# Patient Record
Sex: Female | Born: 1945 | Race: Black or African American | Hispanic: No | Smoking: Never smoker
Health system: Southern US, Community
[De-identification: ages and names within clinical notes are randomized; demographics above are authoritative.]

## PROBLEM LIST (undated history)

## (undated) DIAGNOSIS — N189 Chronic kidney disease, unspecified: Secondary | ICD-10-CM

## (undated) DIAGNOSIS — M1990 Unspecified osteoarthritis, unspecified site: Secondary | ICD-10-CM

## (undated) DIAGNOSIS — R600 Localized edema: Secondary | ICD-10-CM

## (undated) DIAGNOSIS — I890 Lymphedema, not elsewhere classified: Secondary | ICD-10-CM

## (undated) DIAGNOSIS — IMO0002 Reserved for concepts with insufficient information to code with codable children: Secondary | ICD-10-CM

## (undated) DIAGNOSIS — D649 Anemia, unspecified: Secondary | ICD-10-CM

## (undated) DIAGNOSIS — G4733 Obstructive sleep apnea (adult) (pediatric): Secondary | ICD-10-CM

## (undated) DIAGNOSIS — J189 Pneumonia, unspecified organism: Secondary | ICD-10-CM

## (undated) DIAGNOSIS — I4891 Unspecified atrial fibrillation: Secondary | ICD-10-CM

## (undated) DIAGNOSIS — E1140 Type 2 diabetes mellitus with diabetic neuropathy, unspecified: Secondary | ICD-10-CM

## (undated) DIAGNOSIS — I10 Essential (primary) hypertension: Secondary | ICD-10-CM

## (undated) HISTORY — DX: Type 2 diabetes mellitus with diabetic neuropathy, unspecified: E11.40

## (undated) HISTORY — DX: Anemia, unspecified: D64.9

## (undated) HISTORY — DX: Reserved for concepts with insufficient information to code with codable children: IMO0002

## (undated) HISTORY — PX: ABDOMINAL HYSTERECTOMY: SHX81

## (undated) HISTORY — DX: Lymphedema, not elsewhere classified: I89.0

## (undated) HISTORY — DX: Essential (primary) hypertension: I10

## (undated) HISTORY — DX: Pneumonia, unspecified organism: J18.9

## (undated) HISTORY — DX: Unspecified osteoarthritis, unspecified site: M19.90

---

## 2010-06-23 ENCOUNTER — Ambulatory Visit: Payer: Self-pay | Admitting: Family Medicine

## 2010-06-23 DIAGNOSIS — R011 Cardiac murmur, unspecified: Secondary | ICD-10-CM | POA: Insufficient documentation

## 2010-06-23 DIAGNOSIS — E1140 Type 2 diabetes mellitus with diabetic neuropathy, unspecified: Secondary | ICD-10-CM | POA: Insufficient documentation

## 2010-06-23 DIAGNOSIS — E1165 Type 2 diabetes mellitus with hyperglycemia: Secondary | ICD-10-CM

## 2010-06-23 DIAGNOSIS — J45909 Unspecified asthma, uncomplicated: Secondary | ICD-10-CM | POA: Insufficient documentation

## 2010-06-23 DIAGNOSIS — I10 Essential (primary) hypertension: Secondary | ICD-10-CM | POA: Insufficient documentation

## 2010-06-23 DIAGNOSIS — R32 Unspecified urinary incontinence: Secondary | ICD-10-CM | POA: Insufficient documentation

## 2010-06-23 DIAGNOSIS — D649 Anemia, unspecified: Secondary | ICD-10-CM | POA: Insufficient documentation

## 2010-06-27 ENCOUNTER — Telehealth (INDEPENDENT_AMBULATORY_CARE_PROVIDER_SITE_OTHER): Payer: Self-pay | Admitting: *Deleted

## 2010-06-27 LAB — CONVERTED CEMR LAB
ALT: 14 units/L (ref 0–35)
AST: 15 units/L (ref 0–37)
Albumin: 3.4 g/dL — ABNORMAL LOW (ref 3.5–5.2)
Alkaline Phosphatase: 71 units/L (ref 39–117)
BUN: 12 mg/dL (ref 6–23)
Basophils Absolute: 0.1 10*3/uL (ref 0.0–0.1)
Basophils Relative: 0.8 % (ref 0.0–3.0)
Bilirubin, Direct: 0.1 mg/dL (ref 0.0–0.3)
CO2: 29 meq/L (ref 19–32)
Calcium: 8.9 mg/dL (ref 8.4–10.5)
Chloride: 99 meq/L (ref 96–112)
Cholesterol: 211 mg/dL — ABNORMAL HIGH (ref 0–200)
Creatinine, Ser: 1 mg/dL (ref 0.4–1.2)
Direct LDL: 129.3 mg/dL
Eosinophils Absolute: 0.1 10*3/uL (ref 0.0–0.7)
Eosinophils Relative: 1.5 % (ref 0.0–5.0)
GFR calc non Af Amer: 72.34 mL/min (ref >60.00)
Glucose, Bld: 264 mg/dL — ABNORMAL HIGH (ref 70–99)
HCT: 38.9 % (ref 36.0–46.0)
HDL: 39.5 mg/dL (ref >39.00)
Hemoglobin: 12.6 g/dL (ref 12.0–15.0)
Hgb A1c MFr Bld: 10.7 % — ABNORMAL HIGH (ref 4.6–6.5)
Lymphocytes Relative: 31.3 % (ref 12.0–46.0)
Lymphs Abs: 2.1 10*3/uL (ref 0.7–4.0)
MCHC: 32.5 g/dL (ref 30.0–36.0)
MCV: 84.2 fL (ref 78.0–100.0)
Monocytes Absolute: 0.7 10*3/uL (ref 0.1–1.0)
Monocytes Relative: 10.1 % (ref 3.0–12.0)
Neutro Abs: 3.7 10*3/uL (ref 1.4–7.7)
Neutrophils Relative %: 56.3 % (ref 43.0–77.0)
Platelets: 282 10*3/uL (ref 150.0–400.0)
Potassium: 4.5 meq/L (ref 3.5–5.1)
RBC: 4.62 M/uL (ref 3.87–5.11)
RDW: 13.5 % (ref 11.5–14.6)
Sodium: 137 meq/L (ref 135–145)
TSH: 1.99 microintl units/mL (ref 0.35–5.50)
Total Bilirubin: 0.4 mg/dL (ref 0.3–1.2)
Total CHOL/HDL Ratio: 5
Total Protein: 6.6 g/dL (ref 6.0–8.3)
Triglycerides: 207 mg/dL — ABNORMAL HIGH (ref 0.0–149.0)
VLDL: 41.4 mg/dL — ABNORMAL HIGH (ref 0.0–40.0)
WBC: 6.6 10*3/uL (ref 4.5–10.5)

## 2010-06-30 ENCOUNTER — Telehealth (INDEPENDENT_AMBULATORY_CARE_PROVIDER_SITE_OTHER): Payer: Self-pay | Admitting: *Deleted

## 2010-07-04 ENCOUNTER — Ambulatory Visit: Payer: Self-pay

## 2010-07-04 ENCOUNTER — Ambulatory Visit (HOSPITAL_COMMUNITY): Admission: RE | Admit: 2010-07-04 | Payer: Self-pay | Source: Home / Self Care | Admitting: Family Medicine

## 2010-07-05 ENCOUNTER — Telehealth (INDEPENDENT_AMBULATORY_CARE_PROVIDER_SITE_OTHER): Payer: Self-pay | Admitting: *Deleted

## 2010-07-07 ENCOUNTER — Encounter: Payer: Self-pay | Admitting: Family Medicine

## 2010-07-19 ENCOUNTER — Telehealth (INDEPENDENT_AMBULATORY_CARE_PROVIDER_SITE_OTHER): Payer: Self-pay | Admitting: *Deleted

## 2010-07-19 ENCOUNTER — Encounter: Payer: Self-pay | Admitting: Family Medicine

## 2010-08-01 ENCOUNTER — Encounter: Payer: Self-pay | Admitting: Family Medicine

## 2010-08-03 ENCOUNTER — Telehealth: Payer: Self-pay | Admitting: Family Medicine

## 2010-08-04 ENCOUNTER — Other Ambulatory Visit: Payer: Self-pay | Admitting: Family Medicine

## 2010-08-04 DIAGNOSIS — Z0000 Encounter for general adult medical examination without abnormal findings: Secondary | ICD-10-CM

## 2010-08-04 DIAGNOSIS — Z1231 Encounter for screening mammogram for malignant neoplasm of breast: Secondary | ICD-10-CM

## 2010-08-05 ENCOUNTER — Ambulatory Visit: Admit: 2010-08-05 | Payer: Self-pay | Admitting: Gastroenterology

## 2010-08-10 ENCOUNTER — Encounter: Payer: Self-pay | Admitting: Family Medicine

## 2010-08-15 ENCOUNTER — Encounter: Payer: Self-pay | Admitting: Family Medicine

## 2010-08-18 NOTE — Letter (Signed)
Summary: CMN for Diabetes Supplies/[facility]  CMN for Diabetes Supplies/[facility]   Imported By: Edmonia James 08/11/2010 13:06:37  _____________________________________________________________________  External Attachment:    Type:   Image     Comment:   External Document

## 2010-08-18 NOTE — Progress Notes (Signed)
Summary: labs-  Phone Note Outgoing Call   Call placed by: Jodi Underwood CMA,  June 27, 2010 10:34 AM Call placed to: Patient Summary of Call: A1C is not well controlled.  Needs to increase Glipizide/Metformin combo to 2 pills two times a day.  Cholesterol is not well controlled either.  goal is <70 w/ DM.  needs to start Lipitor 20mg  nightly and recheck LFTs in 6-8 weeks.  Follow-up for Phone Call        left message on machine ........Jodi KitchenMalachi Underwood CMA  June 27, 2010 10:35 AM   tried calling patient mailbox full unable to leave msg.........Jodi KitchenMalachi Underwood CMA  June 29, 2010 10:26 AM   Additional Follow-up for Phone Call Additional follow up Details #1::        Pt aware rx sent to pharmacy, # updated...........Jodi KitchenFelecia Underwood CMA  June 30, 2010 12:57 PM     New/Updated Medications: GLIPIZIDE-METFORMIN HCL 5-500 MG TABS (GLIPIZIDE-METFORMIN HCL) Take 2  tablet two times a day LIPITOR 20 MG TABS (ATORVASTATIN CALCIUM) Take 1 tab at bedtime Prescriptions: LIPITOR 20 MG TABS (ATORVASTATIN CALCIUM) Take 1 tab at bedtime  #30 x 2   Entered by:   Jodi Underwood CMA   Authorized by:   Jodi Asa MD   Signed by:   Jodi Underwood CMA on 06/30/2010   Method used:   [facility]ed to ...       [facility] [phone]* (retail)       [place], [place]  [address]       Ph: [phone]       Fax: [phone]   RxID:   [id number] GLIPIZIDE-METFORMIN HCL 5-500 MG TABS (GLIPIZIDE-METFORMIN HCL) Take 2  tablet two times a day  #120 x 2   Entered by:   Jodi Underwood CMA   Authorized by:   Jodi Asa MD   Signed by:   Jodi Underwood CMA on 06/30/2010   Method used:   Faxed to ...       [facility] [phone]* (retail)       [address]       [place], [place]  [address]       Ph: [phone]       Fax: [phone]   RxID:   [id number]

## 2010-08-18 NOTE — Miscellaneous (Signed)
Summary: Care Plan/[facility]  Care Plan/[facility]   Imported By: Edmonia James 07/26/2010 12:35:52  _____________________________________________________________________  External Attachment:    Type:   Image     Comment:   External Document

## 2010-08-18 NOTE — Progress Notes (Signed)
Summary: order for wheelchair and therapy-lmom  Phone Note Call from Patient Call back at Home Phone [phone]   Caller: Patient Summary of Call: Spoke w/ patient would like to have order for Electric wheelchair she has already spoken w/ a vendor and will call back w/ information also would like to have referral to [facility] for aquatic therapy but patient is going to check more into this but does know that they offer some services she feel will be helpful before we send any information. Pt informed Dr. Birdie Riddle out of office but can process information tomorrow. Initial call taken by: Malachi Bonds CMA,  July 05, 2010 1:24 PM  Follow-up for Phone Call        left message on machine to see if she had information to fax to company for electric wheelchair........Marland KitchenMalachi Bonds CMA  July 12, 2010 3:38 PM

## 2010-08-18 NOTE — Miscellaneous (Signed)
Summary: Face to Face Encounter/[facility]  Face to Face Encounter/[facility]   Imported By: Edmonia James 07/29/2010 08:10:14  _____________________________________________________________________  External Attachment:    Type:   Image     Comment:   External Document

## 2010-08-18 NOTE — Letter (Signed)
Summary: [facility]  [facility]   Imported By: Edmonia James 07/20/2010 12:20:33  _____________________________________________________________________  External Attachment:    Type:   Image     Comment:   External Document

## 2010-08-18 NOTE — Progress Notes (Signed)
Summary: refill  Phone Note Refill Request Call back at Home Phone [phone] Message from:  Patient  Refills Requested: Medication #1:  ventolin inhaler [facility]  [address]............Marland KitchenFelecia Deloach CMA  July 19, 2010 10:03 AM      New/Updated Medications: VENTOLIN HFA 108 (90 BASE) MCG/ACT AERS (ALBUTEROL SULFATE) 2 puffs every 4-6 hours as needed Prescriptions: VENTOLIN HFA 108 (90 BASE) MCG/ACT AERS (ALBUTEROL SULFATE) 2 puffs every 4-6 hours as needed  #1 x 2   Entered by:   Malachi Bonds CMA   Authorized by:   Annye Asa MD   Signed by:   Malachi Bonds CMA on 07/19/2010   Method used:   Electronically to        [facility] [phone]* (retail)       [address]       [place], [place]  [address]       Ph: [phone]       Fax: [phone]   RxID:   [id number]

## 2010-08-18 NOTE — Progress Notes (Signed)
Summary: Extend PT  Phone Note Other Incoming   Caller: Gaylan Gerold @ [facility]--[phone] Summary of Call: Call from [facility] at [place] and he stated he has done 4 weeks of pt with the patient and would like the verbal ok to extend for  4 more  weeks. He stated the patient really needs it due to her health. Sd  she is doing great but think she will benefit from the extra exercise. Please advise. Initial call taken by: Aron Baba CMA Deborra Medina),  August 03, 2010 2:03 PM  Follow-up for Phone Call        this would be great!!  as long as PT feels she is benefitting they can continue indefinitely Follow-up by: Annye Asa MD,  August 03, 2010 2:11 PM  Additional Follow-up for Phone Call Additional follow up Details #1::        spoke with Aaron Edelman and made him aware of the above. Additional Follow-up by: Aron Baba CMA Deborra Medina),  August 03, 2010 3:22 PM

## 2010-08-18 NOTE — Assessment & Plan Note (Signed)
Summary: new pt--needs first medicare physical//sph   Vital Signs:  Patient profile:   65 year old female Height:      66 inches Weight:      414 pounds BMI:     67.06 Pulse rate:   94 / minute BP sitting:   132 / 90  (left arm)  Vitals Entered By: Malachi Bonds CMA (June 23, 2010 8:35 AM) CC: NEW EST- YEARLY EXAM    History of Present Illness: 65 yo woman here today to establish care.  Previous MD- [facility].  Here for Medicare AWV:  1.   Risk factors based on Past M, S, F history: DM- dx'd 2007.  on Glipizide/Metformin combo two times a day but only taking once daily ('i forget').  overdue on diabetic eye exam- 'at least 2 yrs ago'.  no symptomatic lows.  doesn't check sugars.  saw nutritionist when first dx'd, not since.  not exercising. HTN- on Lisinopril daily.  no CP, SOB, HAs, visual changes.  + edema (chronic) Obesity- not exercising, unable to leave house w/out assistance. 2.   Physical Activities: very limited due to size 3.   Depression/mood: tearful, 'i think i'm depressed'.  retired nearly 2 yrs ago, 'i'm stuck'.  2 daughters locally, 1 granddaughter.  'i have so much more to give but i feel like i'm at the end'.  not interested in medicine, 'i just pray a lot'. 4.   Hearing: normal to whispered voice at 6 ft 5.   ADL's: unable to bathe due to size.  able to cook, get dressed. 6.   Fall Risk: high risk due to mobility issues 7.   Home Safety: lives in handicapped accessible apt [address].   Height, weight, &visual acuity: see vitals, vision corrected to 20/20 w/ glasses 9.   Counseling: provided on healthy diet, exercise, health maintainence. 10.   Labs ordered based on risk factors: see A&P 11.           Referral Coordination: see A&P.  has never had colonoscopy. 12.           Care Plan: see A&P 13.           Cognitive Assessment: normal linear thought process, memory intact  Preventive Screening-Counseling & Management  Alcohol-Tobacco     Alcohol  drinks/day: <1     Smoking Status: never  Caffeine-Diet-Exercise     Does Patient Exercise: no      Drug Use:  never.    Current Medications (verified): 1)  Glipizide-Metformin Hcl 5-500 Mg Tabs (Glipizide-Metformin Hcl) .... Take One Tablet Two Times A Day 2)  Lisinopril 40 Mg Tabs (Lisinopril) .... Take One Tablet Daily  Allergies (verified): No Known Drug Allergies  Past History:  Past Medical History: Anemia-NOS Asthma Diabetes mellitus, type II Hypertension Urinary incontinence Arthritis  Past Surgical History: Pneumonia Complete Hysterectomy  Family History: adopted family hx unknown  Social History: daughter live locally pt lives alone retiredSmoking Status:  never Does Patient Exercise:  no Drug Use:  never  Review of Systems      See HPI  Physical Exam  General:  morbidly obese Head:  NCAT Eyes:  PERRL, EOMI, fundoscopic exam deferred to ophtho Ears:  External ear exam shows no significant lesions or deformities.  Otoscopic examination reveals clear canals, tympanic membranes are intact bilaterally without bulging, retraction, inflammation or discharge. Hearing is grossly normal bilaterally. Nose:  External nasal examination shows no deformity or inflammation. Nasal mucosa are pink and  moist without lesions or exudates. Mouth:  Oral mucosa and oropharynx without lesions or exudates. Neck:  No deformities, masses, or tenderness noted. Breasts:  unable to exam- pt unable to lie on table Lungs:  decreased BS due to habitus Heart:  II/VI SEM heard best at RUSB Abdomen:  firm ventral hernia (examined in a seated position- pt unable to get on table or lie down) + BS Genitalia:  deferred Pulses:  +2 carotid, radial, DP Extremities:  impossible to distinguish edema from overly large legs Neurologic:  alert & oriented X3, cranial nerves II-XII intact, and sensation intact to light touch.   Skin:  Intact without suspicious lesions or rashes (what is  visible Cervical Nodes:  No lymphadenopathy noted Psych:  tearful, obviously distraught by physical situation   Impression & Recommendations:  Problem # 1:  PHYSICAL EXAMINATION (ICD-V70.0) Assessment New  pt's PE extremetly limited due to body habitus.  check labs.  anticipatory guidance provided on diet, exercise, hygeine.  Orders: Radiology Referral (Radiology) Gastroenterology Referral (GI) Welcome to Medicare, Physical [phone])  Problem # 2:  DIABETES MELLITUS, TYPE II (ICD-250.00) Assessment: New check labs.  adjust meds as needed.  will need eye exam. Her updated medication list for this problem includes:    Glipizide-metformin Hcl 5-500 Mg Tabs (Glipizide-metformin hcl) .Marland Kitchen... Take one tablet two times a day    Lisinopril 40 Mg Tabs (Lisinopril) .Marland Kitchen... Take one tablet daily  Orders: Venipuncture [id number]) Specimen Handling (99000) TLB-A1C / Hgb A1C (Glycohemoglobin) (83036-A1C) TLB-BMP (Basic Metabolic Panel-BMET) (99991111) TLB-Lipid Panel (80061-LIPID) TLB-Hepatic/Liver Function Pnl (80076-HEPATIC)  Problem # 3:  HYPERTENSION (ICD-401.9) Assessment: New fair control.  will follow closely. Her updated medication list for this problem includes:    Lisinopril 40 Mg Tabs (Lisinopril) .Marland Kitchen... Take one tablet daily  Orders: TLB-CBC Platelet - w/Differential (85025-CBCD) TLB-TSH (Thyroid Stimulating Hormone) (84443-TSH)  Problem # 4:  MORBID OBESITY (ICD-278.01) Assessment: New  pt's biggest health problem at this time.  impacting every aspect of her life.  will refer for home health eval to see if pt can get adaptive services and PT.  Orders: Home Health Referral ([place])  Problem # 5:  SYSTOLIC MURMUR (99991111.2) Assessment: New refer to cards for ECHO evaluation and also EKG once pt is lying flat. Orders: Echo Referral (Echo)  Complete Medication List: 1)  Glipizide-metformin Hcl 5-500 Mg Tabs (Glipizide-metformin hcl) .... Take one tablet two times a  day 2)  Lisinopril 40 Mg Tabs (Lisinopril) .... Take one tablet daily  Patient Instructions: 1)  Follow up in 1 month to re-assess everything (30 minute appt) 2)  We'll notify you of your lab results 3)  Someone will call you with your cardiology appt 4)  We'll arrange a home health evaluation 5)  We'll look into the mammogram and colonoscopy 6)  Consider counseling for your depression 7)  Please call with any questions or concerns 8)  Welcome!  We're glad to have you! 9)  Happy Holidays!   Orders Added: 1)  Venipuncture [id number] 2)  Specimen Handling [99000] 3)  TLB-A1C / Hgb A1C (Glycohemoglobin) [83036-A1C] 4)  TLB-BMP (Basic Metabolic Panel-BMET) [id number] 5)  TLB-CBC Platelet - w/Differential [85025-CBCD] 6)  TLB-TSH (Thyroid Stimulating Hormone) [84443-TSH] 7)  TLB-Lipid Panel [80061-LIPID] 8)  TLB-Hepatic/Liver Function Pnl [80076-HEPATIC] 9)  Echo Referral [Echo] 10)  Radiology Referral [Radiology] 11)  Gastroenterology Referral [GI] 12)  Home Health Referral [Home Health] 13)  Welcome to Medicare, Physical [G0402] 14)  New Patient Level III [id number]

## 2010-08-18 NOTE — Progress Notes (Signed)
Summary: refill 90 day supply  Phone Note Refill Request Message from:  Fax from Pharmacy on June 30, 2010 4:53 PM  Refills Requested: Medication #1:  GLIPIZIDE-METFORMIN HCL 5-500 MG TABS Take 2  tablet two times a day  Medication #2:  LISINOPRIL 40 MG TABS take one tablet daily Lesly [facility] - [id number] - note from pharmacy - ins requires 90 day supply   Initial call taken by: Arbie [place],  June 30, 2010 4:54 PM    Prescriptions: LIPITOR 20 MG TABS (ATORVASTATIN CALCIUM) Take 1 tab at bedtime  #90 x 1   Entered by:   Malachi Bonds CMA   Authorized by:   Annye Asa MD   Signed by:   Malachi Bonds CMA on 07/01/2010   Method used:   Electronically to        [facility] [phone]* (retail)       [place], [place]  [address]       Ph: [phone]       Fax: [phone]   RxID:   [id number] LISINOPRIL 40 MG TABS (LISINOPRIL) take one tablet daily  #90 x 1   Entered by:   Malachi Bonds CMA   Authorized by:   Annye Asa MD   Signed by:   Malachi Bonds CMA on 07/01/2010   Method used:   Electronically to        [facility] [phone]* (retail)       [address]       [place], [place]  [address]       Ph: [phone]       Fax: [phone]   RxID:   [id number] GLIPIZIDE-METFORMIN HCL 5-500 MG TABS (GLIPIZIDE-METFORMIN HCL) Take 2  tablet two times a day  #360 x 1   Entered by:   Malachi Bonds CMA   Authorized by:   Annye Asa MD   Signed by:   Malachi Bonds CMA on 07/01/2010   Method used:   Electronically to        [facility] [phone]* (retail)       [address]       [place], [place]  [address]       Ph: [phone]       Fax: [phone]   RxID:   [id number]

## 2010-08-23 ENCOUNTER — Ambulatory Visit (HOSPITAL_COMMUNITY): Admission: RE | Admit: 2010-08-23 | Payer: Self-pay | Source: Ambulatory Visit

## 2010-08-23 ENCOUNTER — Ambulatory Visit (HOSPITAL_COMMUNITY): Admission: RE | Admit: 2010-08-23 | Payer: Self-pay | Source: Home / Self Care | Admitting: Family Medicine

## 2010-08-24 NOTE — Miscellaneous (Signed)
Summary: PT Orders/[facility]  PT Orders/[facility]   Imported By: Edmonia James 08/19/2010 14:12:49  _____________________________________________________________________  External Attachment:    Type:   Image     Comment:   External Document

## 2010-09-01 NOTE — Miscellaneous (Signed)
Summary: Glucose Supplies/[facility]  Glucose Supplies/[facility]   Imported By: Phillis Knack 08/24/2010 07:00:54  _____________________________________________________________________  External Attachment:    Type:   Image     Comment:   External Document

## 2010-09-19 ENCOUNTER — Encounter: Payer: Self-pay | Admitting: Family Medicine

## 2010-09-19 DIAGNOSIS — IMO0001 Reserved for inherently not codable concepts without codable children: Secondary | ICD-10-CM

## 2010-09-19 DIAGNOSIS — E1165 Type 2 diabetes mellitus with hyperglycemia: Secondary | ICD-10-CM

## 2010-09-19 DIAGNOSIS — R269 Unspecified abnormalities of gait and mobility: Secondary | ICD-10-CM

## 2010-09-19 DIAGNOSIS — I10 Essential (primary) hypertension: Secondary | ICD-10-CM

## 2010-09-27 NOTE — Miscellaneous (Signed)
Summary: Certification and Plan of Care/[facility]  Certification and Plan of [place] By: Laural Benes 09/22/2010 09:53:54  _____________________________________________________________________  External Attachment:    Type:   Image     Comment:   External Document

## 2011-03-13 ENCOUNTER — Telehealth: Payer: Self-pay | Admitting: Family Medicine

## 2011-03-16 NOTE — Telephone Encounter (Signed)
Jodi Underwood, per Dr. Birdie Riddle, that [facility] should be doing the mobility chair evaluation

## 2011-03-21 ENCOUNTER — Telehealth: Payer: Self-pay | Admitting: Family Medicine

## 2011-03-21 ENCOUNTER — Ambulatory Visit: Payer: Medicare Other | Admitting: Family Medicine

## 2011-03-22 NOTE — Telephone Encounter (Signed)
Left message for pt to call back for message clarification

## 2011-03-30 NOTE — Telephone Encounter (Signed)
Pt had appointment scheduled on 03/21/11, then again on 04/03/11 for mobility chair evaluation, however both were cancelled. Did not receive a returned call for clarification on message

## 2011-04-03 ENCOUNTER — Ambulatory Visit: Payer: Medicare Other | Admitting: Family Medicine

## 2011-05-24 ENCOUNTER — Telehealth: Payer: Self-pay | Admitting: *Deleted

## 2011-05-24 NOTE — Telephone Encounter (Signed)
Called pt to verify that she did not have [facility] set up for her wheelchair. Pt advised that she mis-spoke and wants whomever MD Birdie Riddle has her set up for wheelchair referal. [facility] care to speak with Andria Rhein left vm for debbie to call back to advise of a wheelchair assessment for pt appt at 11:30pm on nov 15th

## 2011-05-26 ENCOUNTER — Telehealth: Payer: Self-pay | Admitting: *Deleted

## 2011-05-26 NOTE — Telephone Encounter (Signed)
Manually faxed last 2 visits of pt history to [facility]

## 2011-05-31 ENCOUNTER — Encounter: Payer: Self-pay | Admitting: Family Medicine

## 2011-06-01 ENCOUNTER — Ambulatory Visit: Payer: Medicare Other | Admitting: Family Medicine

## 2011-06-06 ENCOUNTER — Telehealth: Payer: Self-pay | Admitting: Family Medicine

## 2011-06-06 NOTE — Telephone Encounter (Signed)
Called and spoke w/ nurse care manager regarding pt's care.  I have only seen pt once (the day she established) and she has subsequently cancelled all her f/u appts.  Nurse reports that pt has had no transportation and they have hired a transportation company to help her make her appts.  Will attempt to schedule pt for her mobility evaluation on  Tuesday 11/27 at 11:30am.  [facility] is pending- nurse is calling pt.

## 2011-06-06 NOTE — Telephone Encounter (Signed)
Transportation is to contact pt to advise of appt for OV for our office on 06-13-11 at 11:30am. [place] and spoke to Andria Rhein she did agree to come for the [employer]. Called to tell angela thomas that [facility] is coming for the appt. Levada Dy advised that she will contact pt to confirm appt with pt, she plans to just come for appt however will contact us back if the pt can not make appt. This nurse scheduled appt on 06-13-11 for 11:30, had to set up for 15 minutes per gives extra 15.

## 2011-06-13 ENCOUNTER — Ambulatory Visit (INDEPENDENT_AMBULATORY_CARE_PROVIDER_SITE_OTHER): Payer: Medicare Other | Admitting: Family Medicine

## 2011-06-13 ENCOUNTER — Encounter: Payer: Self-pay | Admitting: Family Medicine

## 2011-06-13 ENCOUNTER — Telehealth: Payer: Self-pay | Admitting: *Deleted

## 2011-06-13 DIAGNOSIS — I890 Lymphedema, not elsewhere classified: Secondary | ICD-10-CM | POA: Insufficient documentation

## 2011-06-13 DIAGNOSIS — I10 Essential (primary) hypertension: Secondary | ICD-10-CM

## 2011-06-13 DIAGNOSIS — E119 Type 2 diabetes mellitus without complications: Secondary | ICD-10-CM

## 2011-06-13 LAB — CBC WITH DIFFERENTIAL/PLATELET
Basophils Absolute: 0.1 10*3/uL (ref 0.0–0.1)
Basophils Relative: 0.9 % (ref 0.0–3.0)
Eosinophils Absolute: 0.1 10*3/uL (ref 0.0–0.7)
Eosinophils Relative: 1.3 % (ref 0.0–5.0)
HCT: 38.6 % (ref 36.0–46.0)
Hemoglobin: 12.5 g/dL (ref 12.0–15.0)
Lymphocytes Relative: 28.8 % (ref 12.0–46.0)
Lymphs Abs: 2.2 10*3/uL (ref 0.7–4.0)
MCHC: 32.3 g/dL (ref 30.0–36.0)
MCV: 84.1 fl (ref 78.0–100.0)
Monocytes Absolute: 0.7 10*3/uL (ref 0.1–1.0)
Monocytes Relative: 9.4 % (ref 3.0–12.0)
Neutro Abs: 4.6 10*3/uL (ref 1.4–7.7)
Neutrophils Relative %: 59.6 % (ref 43.0–77.0)
Platelets: 285 10*3/uL (ref 150.0–400.0)
RBC: 4.59 Mil/uL (ref 3.87–5.11)
RDW: 13.8 % (ref 11.5–14.6)
WBC: 7.7 10*3/uL (ref 4.5–10.5)

## 2011-06-13 LAB — BASIC METABOLIC PANEL
BUN: 15 mg/dL (ref 6–23)
CO2: 26 mEq/L (ref 19–32)
Calcium: 9.4 mg/dL (ref 8.4–10.5)
Chloride: 103 mEq/L (ref 96–112)
Creatinine, Ser: 1 mg/dL (ref 0.4–1.2)
GFR: 69.68 mL/min (ref >60.00)
Glucose, Bld: 228 mg/dL — ABNORMAL HIGH (ref 70–99)
Potassium: 4 mEq/L (ref 3.5–5.1)
Sodium: 139 mEq/L (ref 135–145)

## 2011-06-13 LAB — HEPATIC FUNCTION PANEL
ALT: 15 U/L (ref 0–35)
AST: 17 U/L (ref 0–37)
Albumin: 3.6 g/dL (ref 3.5–5.2)
Alkaline Phosphatase: 60 U/L (ref 39–117)
Bilirubin, Direct: 0.1 mg/dL (ref 0.0–0.3)
Total Bilirubin: 0.5 mg/dL (ref 0.3–1.2)
Total Protein: 7.1 g/dL (ref 6.0–8.3)

## 2011-06-13 LAB — LIPID PANEL
Cholesterol: 193 mg/dL (ref 0–200)
HDL: 48.1 mg/dL (ref >39.00)
LDL Cholesterol: 110 mg/dL — ABNORMAL HIGH (ref 0–99)
Total CHOL/HDL Ratio: 4
Triglycerides: 175 mg/dL — ABNORMAL HIGH (ref 0.0–149.0)
VLDL: 35 mg/dL (ref 0.0–40.0)

## 2011-06-13 LAB — TSH: TSH: 2.29 u[IU]/mL (ref 0.35–5.50)

## 2011-06-13 NOTE — Patient Instructions (Signed)
We will call [facility] and arrange a mobility evaluation appt We'll let you know about your lab results and make any med changes as needed Someone will call you with your nutrition and vascular appt I'm so glad you're back!!  We'll make this right! Call with any questions or concerns Happy Holidays!

## 2011-06-13 NOTE — Telephone Encounter (Signed)
Spoke to pt to advise that [facility] will and can come out on Friday 06-16-11 at 11:30am if pt is able to come. Pt advised that she will be able to come. I asked if she will have a ride to facility, pt advised that she will. Spoke to Darlina Guys from [facility] to verfiy that she will INDEED be here on Friday 06-16-11 at 11:30am

## 2011-06-13 NOTE — Telephone Encounter (Signed)
Noted! Thank you

## 2011-06-13 NOTE — Progress Notes (Signed)
  Subjective:    Patient ID: Jodi Solders, female    DOB: [birth year], 65 y.o.   MRN: [id number]  HPI DM- chronic problem.  Pt has not been seen in nearly 1 yr due to mobility issues and transportation difficulties.  Prior to her electric chair breaking she was going to exercise in the pool regularly.  Had attempted to limit her carb intake and increase her fruits and veggies.  Denies symptomatic lows, dizziness, CP, SOB, HAs, visual changes.  + edema (chronic)  Obesity- pt has not lost any weight in the last year despite her efforts at changing diet and starting regular exercise.  Pt reports she had initially lost weight but once she was no longer able to exercise she gained it back.  Needs power chair to facilitate her exercise.  HTN- chronic problem.  Fairly well controlled.  Taking lisinopril.  Asymptomatic.  Lymphedema- new.  Severely limits pt's mobility.  Legs are too large for compression hose.  Has never seen vascular.   Review of Systems For ROS see HPI     Objective:   Physical Exam  Vitals reviewed. Constitutional: She is oriented to person, place, and time. She appears well-developed and well-nourished. No distress.       Morbidly obese, sitting in wheelchair  HENT:  Head: Normocephalic and atraumatic.  Eyes: Conjunctivae and EOM are normal. Pupils are equal, round, and reactive to light.  Neck: Normal range of motion. Neck supple. No thyromegaly present.  Cardiovascular: Normal rate, regular rhythm and intact distal pulses.   Murmur (I-II/VI SEM) heard. Pulmonary/Chest: Effort normal and breath sounds normal. No respiratory distress.  Abdominal: Soft. She exhibits no distension. There is no tenderness.  Musculoskeletal: Edema: 2+ bilaterally.  Lymphadenopathy:    She has no cervical adenopathy.  Neurological: She is alert and oriented to person, place, and time.  Skin: Skin is warm and dry.  Psychiatric: She has a normal mood and affect. Her behavior is normal.           Assessment & Plan:

## 2011-06-16 ENCOUNTER — Ambulatory Visit (INDEPENDENT_AMBULATORY_CARE_PROVIDER_SITE_OTHER): Payer: Medicare Other | Admitting: Family Medicine

## 2011-06-16 ENCOUNTER — Encounter: Payer: Self-pay | Admitting: Family Medicine

## 2011-06-16 VITALS — BP 142/80 | HR 83 | Temp 98.0°F | Ht 66.0 in | Wt >= 6400 oz

## 2011-06-16 DIAGNOSIS — M1990 Unspecified osteoarthritis, unspecified site: Secondary | ICD-10-CM | POA: Insufficient documentation

## 2011-06-16 DIAGNOSIS — Z7409 Other reduced mobility: Secondary | ICD-10-CM

## 2011-06-16 DIAGNOSIS — R69 Illness, unspecified: Secondary | ICD-10-CM

## 2011-06-16 DIAGNOSIS — IMO0002 Reserved for concepts with insufficient information to code with codable children: Secondary | ICD-10-CM | POA: Insufficient documentation

## 2011-06-16 NOTE — Assessment & Plan Note (Addendum)
Due to pt's back, shoulder, hip, and knee pain/arthritis, as well as her SOB from asthma and her lymphedema she requires a power chair.  With assistance of [facility] [place], mobility assessment was performed.  Total time w/ pt was 37 minutes, >50% counseling and coordinating pt care

## 2011-06-16 NOTE — Progress Notes (Signed)
  Subjective:    Patient ID: Jodi Underwood, female    DOB: 1945-10-01, 65 y.o.   MRN: [id number]  HPI Mobility assessment- pt previously had a power chair but this chair is in disrepair and not able to be fixed.  Unable to walk at all w/out walker due to degenerative changes of back, L hip, and knees bilaterally.  With assistance of a walker she is able to walk ~5 steps prior to developing pain of 10/10 and SOB due to asthma and morbid obesity (412 lbs).  Due to this weight of 412 lbs pt will require heavy duty power chair.  L shoulder pain- will occasional 'catch', pain worsens w/ increased activity such as propelling manual chair.  Pain is 8-9/10 and requires pain meds.  Pt actually avoids activity to prevent pain.  Back pain- occurs when pt makes any sort of circular motion of shoulders (ie- propelling manual chair).  Pain is 8-9/10 and will extend 'all the way across my back but starts on this side (L)'.  Has hx of degenerative disc disease in back.  (also w/ degenerative changes of hip, knees, shoulders).  For these same reasons she is unable to operate a scooter due to tiller steering mechanisms  Pt is unable to move from room to room in her home or prepare food w/out assistance of this chair.  Despite the noted physical limitations in using a walker or manual chair, she has no limitations physically or cognitively in operating a power chair.  She is not only agreeable to using this chair but has been looking forward to receiving her new chair.   Review of Systems See below    Objective:   Physical Exam  Musculoskeletal:       Left shoulder: She exhibits pain and spasm.       RUE- strength 4/5 LUE- strength 4/5 LLE- strength 4/5 RLE- strenth 5/5     Lymphedema- pt w/ severe swelling of legs bilaterally.        Assessment & Plan:

## 2011-06-16 NOTE — Patient Instructions (Signed)
We'll work on getting this Wellsite geologist in there! The labs look better!!! Call with any questions or concerns Happy Holidays!

## 2011-06-25 NOTE — Assessment & Plan Note (Signed)
Chronic problem.  Well controlled today.  Asymptomatic.  No changes.

## 2011-06-25 NOTE — Assessment & Plan Note (Signed)
Unchanged.  Again discussed the importance of healthy diet and regular exercise.  Pt aware and is trying very hard to get her electric chair replaced so that she is able to resume her exercise program.  Will assist as able.

## 2011-06-25 NOTE — Assessment & Plan Note (Signed)
Chronic problem.  Uncertain of control- due for labs.  Will adjust meds prn.  Discussed importance of regular f/u's to ensure she is well cared for and attempt to avoid complications.  Pt expressed understanding and is in agreement w/ plan.

## 2011-06-25 NOTE — Assessment & Plan Note (Signed)
New problem.  Severely limiting mobility.  Will refer to vascular for complete evaluation and possible tx.  Pt expressed understanding and is in agreement w/ plan.

## 2011-06-27 ENCOUNTER — Ambulatory Visit
Admission: RE | Admit: 2011-06-27 | Discharge: 2011-06-27 | Disposition: A | Discharge status: Home / Self Care | Payer: Medicare Other | Source: Ambulatory Visit | Attending: Family Medicine | Admitting: Family Medicine

## 2011-06-27 DIAGNOSIS — Z1231 Encounter for screening mammogram for malignant neoplasm of breast: Secondary | ICD-10-CM

## 2011-07-19 ENCOUNTER — Ambulatory Visit (INDEPENDENT_AMBULATORY_CARE_PROVIDER_SITE_OTHER): Payer: Medicare Other | Admitting: Family Medicine

## 2011-07-19 ENCOUNTER — Encounter: Payer: Self-pay | Admitting: Family Medicine

## 2011-07-19 DIAGNOSIS — I10 Essential (primary) hypertension: Secondary | ICD-10-CM

## 2011-07-19 DIAGNOSIS — E119 Type 2 diabetes mellitus without complications: Secondary | ICD-10-CM

## 2011-07-19 DIAGNOSIS — Z0000 Encounter for general adult medical examination without abnormal findings: Secondary | ICD-10-CM | POA: Insufficient documentation

## 2011-07-19 DIAGNOSIS — N3946 Mixed incontinence: Secondary | ICD-10-CM

## 2011-07-19 DIAGNOSIS — E785 Hyperlipidemia, unspecified: Secondary | ICD-10-CM | POA: Insufficient documentation

## 2011-07-19 DIAGNOSIS — Z1211 Encounter for screening for malignant neoplasm of colon: Secondary | ICD-10-CM

## 2011-07-19 NOTE — Patient Instructions (Signed)
Follow up the end of Feb/beginning of March to recheck diabetes Someone will call you with your GI appt and Nutrition appt Keep up the good work!  You're getting there! Call with any questions or concerns Happy New Year!!!

## 2011-07-19 NOTE — Progress Notes (Signed)
  Subjective:    Patient ID: Jodi Underwood, female    DOB: May 11, 1946, 66 y.o.   MRN: [id number]  HPI Here today for CPE.  Risk Factors: DM- chronic problem, has lost 13 lbs!  On Glipizide-Metformin and Januvia.  Overdue on eye exam- plans on scheduling this [facility]). HTN- chronic problem.  Stable today.  Denies CP, SOB above baseline, HAs, visual changes.  + edema. Hyperlipidemia-  Chronic problem.  Labs reviewed from last visit.  Tolerating statin w/out difficulty. Incontinence- chronic problem, has mixed stress and urge incontinence. Physical Activity: was previously going to the gym and swimming but is waiting on electric chair Fall Risk: high risk Depression: admits to 'down days' due to her mobility impairment. Hearing: normal to whispered voice at 6 ft ADL's: independent Cognitive: normal linear thought process, memory and attention intact Home Safety: feels safe at home, lives w/ daughter Height, Weight, BMI, Visual Acuity: see vitals, vision corrected to 20/20 w/ glasses Counseling: UTD on mammo, has never had colonoscopy (open to referral) Labs Ordered: See A&P Care Plan: See A&P    Review of Systems Patient reports no vision/ hearing changes, adenopathy,fever, weight change,  persistant/recurrent hoarseness , swallowing issues, chest pain, palpitations, persistant/recurrent cough, hemoptysis, gastrointestinal bleeding (melena, rectal bleeding), abdominal pain, significant heartburn, bowel changes, GU symptoms (dysuria, hematuria), Gyn symptoms (abnormal  bleeding, pain),  syncope, focal weakness, memory loss, numbness & tingling, skin/hair/nail changes, abnormal bruising or bleeding.  + edema, SOB (stable), depression    Objective:   Physical Exam General Appearance:    Alert, cooperative, no distress, morbidly obese in wheel chair  Head:    Normocephalic, without obvious abnormality, atraumatic  Eyes:    PERRL, conjunctiva/corneas clear, EOM's intact, fundi    benign,  both eyes  Ears:    Normal TM's and external ear canals, both ears  Nose:   Nares normal, septum midline, mucosa normal, no drainage    or sinus tenderness  Throat:   Lips, mucosa, and tongue normal; teeth and gums normal  Neck:   Supple, symmetrical, trachea midline, no adenopathy;    Thyroid: no enlargement/tenderness/nodules  Back:     Symmetric, no CVA tenderness  Lungs:     Clear to auscultation bilaterally, respirations unlabored  Chest Wall:    No tenderness or deformity   Heart:    Regular rate and rhythm, S1 and S2 normal, no murmur, rub   or gallop  Breast Exam:    Deferred  Abdomen:     Soft, non-tender, bowel sounds active all four quadrants,    no masses, no organomegaly  Genitalia:    Deferred  Rectal:    Extremities:   Considerable swelling of bilateral LEs  Pulses:   2+ and symmetric all extremities  Skin:   Skin color, texture, turgor normal, no rashes or lesions  Lymph nodes:   Cervical, supraclavicular, and axillary nodes normal  Neurologic:   CNII-XII intact, normal strength, sensation and reflexes    throughout          Assessment & Plan:

## 2011-07-21 ENCOUNTER — Encounter: Payer: Self-pay | Admitting: Gastroenterology

## 2011-07-21 ENCOUNTER — Other Ambulatory Visit: Payer: Self-pay

## 2011-07-21 DIAGNOSIS — R600 Localized edema: Secondary | ICD-10-CM

## 2011-07-24 ENCOUNTER — Other Ambulatory Visit: Payer: Self-pay | Admitting: Family Medicine

## 2011-07-24 MED ORDER — ALBUTEROL SULFATE HFA 108 (90 BASE) MCG/ACT IN AERS: 2.0000 | INHALATION_SPRAY | Freq: Four times a day (QID) | RESPIRATORY_TRACT | Status: DC | PRN

## 2011-07-24 NOTE — Telephone Encounter (Signed)
rx sent to pharmacy by e-script  

## 2011-07-30 DIAGNOSIS — N3946 Mixed incontinence: Secondary | ICD-10-CM | POA: Insufficient documentation

## 2011-07-30 NOTE — Assessment & Plan Note (Signed)
New.  Pt reports sxs of mixed incontinence.  Offered uro referral but pt prefers to do Kegel's and see if sxs improve.  Will continue to follow.

## 2011-07-30 NOTE — Assessment & Plan Note (Signed)
Chronic problem.  Tolerating statin w/out difficulty.  Reviewed labs from previous visit.  Applauded weight loss efforts.

## 2011-07-30 NOTE — Assessment & Plan Note (Signed)
Pt's PE WNL w/ exception of obesity and lymph edema.  Pt is trying to take care of her health care needs after a year long medical hiatus.  Will refer for colonoscopy.  Has mammo upcoming.  Reviewed labs from previous visit.  Will continue to follow pt closely.

## 2011-07-30 NOTE — Assessment & Plan Note (Signed)
Chronic problem.  Has lost 13 lbs since last visit.  Would like to see nutrition to get more info on how to better manage her dz.  Too early for repeat A1C.  Applauded her recent efforts.  Will continue to follow closely.

## 2011-07-30 NOTE — Assessment & Plan Note (Signed)
BP control stable but not ideal today.  No med changes but will continue to follow closely.  Hopefully pt's increased attention to diet and increased exercise will improve control.

## 2011-07-31 ENCOUNTER — Ambulatory Visit: Payer: Medicare Other | Admitting: Gastroenterology

## 2011-08-03 ENCOUNTER — Other Ambulatory Visit: Payer: Medicare Other

## 2011-08-03 ENCOUNTER — Encounter: Payer: Medicare Other | Admitting: Vascular Surgery

## 2011-08-22 ENCOUNTER — Ambulatory Visit: Payer: Medicare Other | Admitting: Gastroenterology

## 2011-08-23 ENCOUNTER — Other Ambulatory Visit: Payer: Self-pay | Admitting: Family Medicine

## 2011-08-23 MED ORDER — LISINOPRIL 40 MG PO TABS: 40.0000 mg | ORAL_TABLET | Freq: Every day | ORAL | Status: DC

## 2011-08-23 NOTE — Telephone Encounter (Signed)
Rx sent 

## 2011-09-13 ENCOUNTER — Encounter: Payer: Self-pay | Admitting: Vascular Surgery

## 2011-09-14 ENCOUNTER — Encounter: Payer: Medicare Other | Admitting: Vascular Surgery

## 2011-10-02 ENCOUNTER — Telehealth: Payer: Self-pay | Admitting: Family Medicine

## 2011-10-02 ENCOUNTER — Telehealth: Payer: Self-pay | Admitting: *Deleted

## 2011-10-02 NOTE — Telephone Encounter (Signed)
Resume Januvia.  Eat a low-fat diet with lots of fruits and vegetables, up to 7-9 servings per day. Consume less than 30( preferably ZERO) grams of sugar per day from foods & drinks with High Fructose Corn Syrup (HFCS) sugar as #1,2,3 or # 4 on label.[employer], [employer] do not carry products with HFCS. Follow a  low carb nutrition program such as Gibbs or The New Sugar Busters  to prevent Diabetes progression . White carbohydrates (potatoes, rice, bread, and pasta) have a high spike of sugar and a high load of sugar. For example a  baked potato has a cup of sugar and a  french fry  2 teaspoons of sugar. Yams, wild  rice, whole grained bread &  wheat pasta have been much lower spike and load of  sugar. Portions should be the size of a deck of cards or your palm.

## 2011-10-02 NOTE — Telephone Encounter (Signed)
Refill- glipizide-metformin 5-500mg . Take 2 tablet two times a day. Qty 360 last fill 7.16.12

## 2011-10-02 NOTE — Telephone Encounter (Signed)
Left message to call office to verify med dose.

## 2011-10-02 NOTE — Telephone Encounter (Signed)
Call transfer from call-a-nurse: Pt BS today fasting 342, yesterday 288, and the previous day 389 all reading are fasting. Pt indicated that she was given Januvia 100 mg samples but is currently out so she has not been taking med. Pt notes that she has only been taking the metaglip. Pt would like to know if she needs to continue with the Januvia.Please advise

## 2011-10-02 NOTE — Telephone Encounter (Signed)
Left message to call office

## 2011-10-06 MED ORDER — SITAGLIPTIN PHOSPHATE 100 MG PO TABS: 100.0000 mg | ORAL_TABLET | Freq: Every day | ORAL | Status: DC

## 2011-10-06 MED ORDER — GLUCOSE BLOOD VI STRP: ORAL_STRIP | Status: AC

## 2011-10-06 MED ORDER — ONETOUCH DELICA LANCETS MISC: Status: DC

## 2011-10-06 MED ORDER — GLIPIZIDE-METFORMIN HCL 5-500 MG PO TABS: 1.0000 | ORAL_TABLET | Freq: Two times a day (BID) | ORAL | Status: DC

## 2011-10-06 NOTE — Telephone Encounter (Signed)
Pt states that current sig on file is correct med was decreased. Rx sent.

## 2011-10-06 NOTE — Telephone Encounter (Signed)
Discuss with patient  

## 2011-10-17 ENCOUNTER — Ambulatory Visit: Payer: Medicare Other | Admitting: *Deleted

## 2011-10-31 ENCOUNTER — Encounter: Payer: Self-pay | Admitting: Vascular Surgery

## 2011-11-02 ENCOUNTER — Encounter: Payer: Self-pay | Admitting: Vascular Surgery

## 2011-11-02 ENCOUNTER — Ambulatory Visit (INDEPENDENT_AMBULATORY_CARE_PROVIDER_SITE_OTHER): Payer: Medicare Other | Admitting: *Deleted

## 2011-11-02 ENCOUNTER — Ambulatory Visit (INDEPENDENT_AMBULATORY_CARE_PROVIDER_SITE_OTHER): Payer: Medicare Other | Admitting: Vascular Surgery

## 2011-11-02 VITALS — BP 135/79 | HR 87 | Resp 18 | Ht 66.0 in | Wt 398.0 lb

## 2011-11-02 DIAGNOSIS — I890 Lymphedema, not elsewhere classified: Secondary | ICD-10-CM

## 2011-11-02 NOTE — Progress Notes (Signed)
[facility] HISTORY AND PHYSICAL   History of Present Illness:  Patient is a 66 y.o. year old female who presents for evaluation of lymphedema and leg swelling.  Other medical problems include severe obesity, anemia, arthritis, asthma, diabetes, hypertension, urinary incontinence, degenerative disc disease, neuropathy.  These are all currently stable and followed by Dr. Birdie Riddle. The patient states she has been having slowly progressive swelling of her lower extremities for the last 30 years. This is become progressively worse over the last 10 years. The leg swelling is to the point now she can get shoes on. She has been counseled on weight loss and has lost 16 pounds in the last year. She denies any prior travel outside [place]. She denies any family history of lymphedema. She denies any significant trauma to her lower extremities her prior DVT.  Past Medical History  Diagnosis Date  . Anemia   . Arthritis   . Asthma   . Diabetes mellitus   . Hypertension   . Urinary incontinence   . Pneumonia   . Degenerative disc disease   . Diabetic neuropathy   . Lymphedema     Past Surgical History  Procedure Date  . Abdominal hysterectomy     COMPLETE     Social History History  Substance Use Topics  . Smoking status: Never Smoker   . Smokeless tobacco: Never Used  . Alcohol Use: Yes     occassionally    Family History Family History  Problem Relation Age of Onset  . Adopted: Yes    Allergies  No Known Allergies   Current Outpatient Prescriptions  Medication Sig Dispense Refill  . albuterol (PROVENTIL HFA;VENTOLIN HFA) 108 (90 BASE) MCG/ACT inhaler Inhale 2 puffs into the lungs every 6 (six) hours as needed.  18 g  1  . glipiZIDE-metformin (METAGLIP) 5-500 MG per tablet Take 2 tablets by mouth 2 (two) times daily before a meal.      . glucose blood (ONE TOUCH TEST STRIPS) test strip Use as instructed  100 each  1  . lisinopril  (PRINIVIL,ZESTRIL) 40 MG tablet Take 1 tablet (40 mg total) by mouth daily.  90 tablet  0  . ONETOUCH DELICA LANCETS MISC Test once daily  100 each  1  . DISCONTD: glipiZIDE-metformin (METAGLIP) 5-500 MG per tablet Take 1 tablet by mouth 2 (two) times daily before a meal.  180 tablet  0  . atorvastatin (LIPITOR) 20 MG tablet Take 20 mg by mouth daily.        . sitaGLIPtin (JANUVIA) 100 MG tablet Take 1 tablet (100 mg total) by mouth daily.  90 tablet  0    ROS:   General:  No Fever, chills  HEENT: No recent headaches, no nasal bleeding, no visual changes, no sore throat  Neurologic: No dizziness, blackouts, seizures. No recent symptoms of stroke or mini- stroke. No recent episodes of slurred speech, or temporary blindness.  Cardiac: No recent episodes of chest pain/pressure, no shortness of breath at rest.  No shortness of breath with exertion.  Denies history of atrial fibrillation or irregular heartbeat  Vascular: No history of rest pain in feet.  No history of claudication.  No history of non-healing ulcer, No history of DVT   Pulmonary: No home oxygen, no productive cough, no hemoptysis  Musculoskeletal:  [ ]  Arthritis, [x ] Low back pain,  [ x] Joint pain  Hematologic:No history of hypercoagulable state.  No history of easy bleeding.  No history of anemia  Gastrointestinal: No hematochezia or melena,  No gastroesophageal reflux, no trouble swallowing  Urinary: [ ]  chronic Kidney disease, [ ]  on HD - [ ]  MWF or [ ]  TTHS, [ ]  Burning with urination, [ ]  Frequent urination, [ ]  Difficulty urinating;   Skin: No rashes  Psychological: No history of anxiety,  No history of depression   Physical Examination  Filed Vitals:   11/02/11 1227  BP: 135/79  Pulse: 87  Resp: 18  Height: 5\' 6"  (1.676 m)  Weight: 398 lb (180.532 kg)    Body mass index is 64.24 kg/(m^2).  General:  Alert and oriented, no acute distress HEENT: Normal Neck: No bruit or JVD Pulmonary: Clear to  auscultation bilaterally Cardiac: Regular Rate and Rhythm without murmur Abdomen: obese Skin: No rash Extremity Pulses:  2+ radial however femoral, dorsalis pedis, posterior tibial pulses difficult to palpate bilaterally due to obesity, feet are pink and warm, buffalo hump appearance both feet, nonpitting edema Musculoskeletal: No deformity or edema  Neurologic: Upper and lower extremity motor 5/5 and symmetric    ASSESSMENT: Possible he has some component of lymphedema. She most likely has a component of venous insufficiency as well since people with this severe obesity usually have venous problems. Her obesity is her primary problem. It is difficult to tell whether or not she actually has lymphedema or this is just severe obesity.   PLAN:  We will refer her to physical therapy Meredith Leeds for instructions and compression and lymphedema management.  She was again counseled on weight management. She'll followup with me on as-needed basis.  Ruta Hinds, MD [facility] Office: [phone] Pager: [phone]

## 2011-11-20 ENCOUNTER — Encounter: Payer: Medicare Other | Attending: Family Medicine | Admitting: *Deleted

## 2011-11-20 VITALS — Ht 66.0 in | Wt >= 6400 oz

## 2011-11-20 DIAGNOSIS — E119 Type 2 diabetes mellitus without complications: Secondary | ICD-10-CM

## 2011-11-20 DIAGNOSIS — Z713 Dietary counseling and surveillance: Secondary | ICD-10-CM | POA: Insufficient documentation

## 2011-11-21 ENCOUNTER — Encounter: Payer: Self-pay | Admitting: *Deleted

## 2011-11-21 NOTE — Progress Notes (Signed)
  Medical Nutrition Therapy:  Appt start time: 1200 end time:  1300.  Assessment:  T2DM.  A1c of 8.8% (06/13/11)  MEDICATIONS: See medication list; reconciled with pt.    DIETARY INTAKE:  Usual eating pattern includes 2 meals and 0-2 snacks per day.  24-hr recall: Unable to obtain d/t time constraints.   Usual physical activity: None. Was doing 4-5 days/week of water exercises (Zumba, etc) until Sept 2012. Plans to resume as soon as possible.   Estimated energy needs: 1400-1800 calories (range based on sedentary vs active again) 160-200 g carbohydrates 100-135 g protein 40-50 g fat  Progress Towards Goal(s):  In progress.   Nutritional Diagnosis:  [place]lett-2.1 Inpaired nutrition utilization related to glucose metabolism as evidenced by A1c of 8.8% and MD referral for T2DM assessment/education.    Intervention:  Nutrition education/reinforcement.  Handouts given during visit include:  Living Well with Diabetes - [employer]  Target Blood Glucose Levels  Low CHO Snack List  Monitoring/Evaluation:  Dietary intake, exercise, A1c, blood glucose trends, and body weight in 6 week(s).

## 2011-11-21 NOTE — Patient Instructions (Signed)
Goals:  Follow Diabetes Meal Plan as instructed (see yellow card)  Eat 3 meals and 2 snacks, every 3-5 hrs  Limit carbohydrate intake to 45 grams carbohydrate/meal  Limit carbohydrate intake to 15 grams carbohydrate/snack  Avoid/limit concentrated sweets, sugar-sweetened beverages, and high-fat/fried foods  Add lean protein foods to all meals/snacks  Monitor glucose levels as instructed by your doctor  Aim for >30 mins of physical activity daily  Bring food record and glucose log to your next nutrition visit

## 2011-12-22 ENCOUNTER — Ambulatory Visit: Payer: Medicare Other | Admitting: *Deleted

## 2011-12-28 ENCOUNTER — Ambulatory Visit: Payer: Medicare Other | Admitting: Physical Therapy

## 2012-01-25 ENCOUNTER — Ambulatory Visit: Payer: Medicare Other | Admitting: *Deleted

## 2012-02-12 ENCOUNTER — Telehealth: Payer: Self-pay | Admitting: *Deleted

## 2012-02-12 NOTE — Telephone Encounter (Signed)
Received form to be completed by MD Birdie Riddle noting pt needs to be seen in window of 10-03-11 through 01-31-12 however the pt has not been seen since 07-19-11, left detailed message on pt vm to advise the pt needs to set up a 43minute follow up office visit in order to have her forms filled out,

## 2012-02-21 NOTE — Telephone Encounter (Signed)
Tried to call pt again to advise she needs 87min Follow up OV, noted pt vm did not pick up this time, however noted recording that pt does not have a vm box that has been set up yet, will try to call back again. MD Birdie Riddle made aware verbally still trying to call pt

## 2012-02-26 NOTE — Telephone Encounter (Signed)
.  left message to have patient return my call.  

## 2012-03-05 NOTE — Telephone Encounter (Signed)
.  left message to have patient return my call. Also sent an email to Darlina Guys with [facility] to note unable to contact pt about follow up apt, no apt set up at this time, will await response

## 2012-03-07 NOTE — Telephone Encounter (Signed)
Received incoming staff message from [facility] office contact with [facility] (forms from [facility] to be completed) and was advised the following:  You can just disregard that request. We are going to try to get documentation from one of her other physicians. Thanks though! ----- Message ----- From: Alba Destine, LPN Sent: [id number] PM To: Melissa Stenson Subject: unable to contact pt   Please note

## 2013-01-16 ENCOUNTER — Emergency Department (HOSPITAL_BASED_OUTPATIENT_CLINIC_OR_DEPARTMENT_OTHER): Payer: Medicare Other

## 2013-01-16 ENCOUNTER — Encounter (HOSPITAL_BASED_OUTPATIENT_CLINIC_OR_DEPARTMENT_OTHER): Payer: Self-pay | Admitting: *Deleted

## 2013-01-16 ENCOUNTER — Emergency Department (HOSPITAL_BASED_OUTPATIENT_CLINIC_OR_DEPARTMENT_OTHER)
Admission: EM | Admit: 2013-01-16 | Discharge: 2013-01-16 | Disposition: A | Discharge status: Home / Self Care | Payer: Medicare Other | Attending: Emergency Medicine | Admitting: Emergency Medicine

## 2013-01-16 DIAGNOSIS — IMO0002 Reserved for concepts with insufficient information to code with codable children: Secondary | ICD-10-CM | POA: Insufficient documentation

## 2013-01-16 DIAGNOSIS — Z8739 Personal history of other diseases of the musculoskeletal system and connective tissue: Secondary | ICD-10-CM | POA: Insufficient documentation

## 2013-01-16 DIAGNOSIS — W010XXA Fall on same level from slipping, tripping and stumbling without subsequent striking against object, initial encounter: Secondary | ICD-10-CM | POA: Insufficient documentation

## 2013-01-16 DIAGNOSIS — Y92009 Unspecified place in unspecified non-institutional (private) residence as the place of occurrence of the external cause: Secondary | ICD-10-CM | POA: Insufficient documentation

## 2013-01-16 DIAGNOSIS — E119 Type 2 diabetes mellitus without complications: Secondary | ICD-10-CM | POA: Insufficient documentation

## 2013-01-16 DIAGNOSIS — S39012A Strain of muscle, fascia and tendon of lower back, initial encounter: Secondary | ICD-10-CM

## 2013-01-16 DIAGNOSIS — J45909 Unspecified asthma, uncomplicated: Secondary | ICD-10-CM | POA: Insufficient documentation

## 2013-01-16 DIAGNOSIS — Z79899 Other long term (current) drug therapy: Secondary | ICD-10-CM | POA: Insufficient documentation

## 2013-01-16 DIAGNOSIS — Z862 Personal history of diseases of the blood and blood-forming organs and certain disorders involving the immune mechanism: Secondary | ICD-10-CM | POA: Insufficient documentation

## 2013-01-16 DIAGNOSIS — S7000XA Contusion of unspecified hip, initial encounter: Secondary | ICD-10-CM | POA: Insufficient documentation

## 2013-01-16 DIAGNOSIS — G8929 Other chronic pain: Secondary | ICD-10-CM | POA: Insufficient documentation

## 2013-01-16 DIAGNOSIS — Z8701 Personal history of pneumonia (recurrent): Secondary | ICD-10-CM | POA: Insufficient documentation

## 2013-01-16 DIAGNOSIS — Y9389 Activity, other specified: Secondary | ICD-10-CM | POA: Insufficient documentation

## 2013-01-16 DIAGNOSIS — S7001XA Contusion of right hip, initial encounter: Secondary | ICD-10-CM

## 2013-01-16 DIAGNOSIS — I10 Essential (primary) hypertension: Secondary | ICD-10-CM | POA: Insufficient documentation

## 2013-01-16 MED ORDER — HYDROCODONE-ACETAMINOPHEN 5-325 MG PO TABS: 2.0000 | ORAL_TABLET | ORAL | Status: DC | PRN

## 2013-01-16 MED ORDER — OXYCODONE-ACETAMINOPHEN 5-325 MG PO TABS
2.0000 | ORAL_TABLET | Freq: Once | ORAL | Status: AC
  Administered 2013-01-16: 2 via ORAL
  Filled 2013-01-16: qty 2

## 2013-01-16 MED ORDER — OXYCODONE-ACETAMINOPHEN 5-325 MG PO TABS
ORAL_TABLET | ORAL | Status: AC
  Administered 2013-01-16: 2 via ORAL
  Filled 2013-01-16: qty 2

## 2013-01-16 NOTE — ED Notes (Signed)
Attempt to readjust pt to comfortable position. MD aware that pt is uncomfortable. Pain Meds order. RN aware.

## 2013-01-16 NOTE — ED Notes (Signed)
Pt to room 7 by ems via stretcher. Pt reports trip and fall while attempting to stand from her chair to go to the bathroom at home. Pt c/o right hip pain, states "I always have trouble with that hip, it isn't broken..." pt with edema to lower extremeties with dressings in place, weeping clear drainage noted. Pt noted to be soaked in urine, gown changed, peri pads changed. Pt denies hitting head or any other c/o at this time.

## 2013-01-16 NOTE — ED Notes (Signed)
Pt care transferred to [facility]. Pt to be transported home, pt and daughter aware.

## 2013-01-16 NOTE — ED Provider Notes (Signed)
History    CSN: [id number] Arrival date & time 01/16/13  [id number]  First MD Initiated Contact with Patient 01/16/13 [phone]     Chief Complaint  Patient presents with  . Fall  . Hip Pain  . Back Pain   (Consider location/radiation/quality/duration/timing/severity/associated sxs/prior Treatment) HPI Comments: Patient presents with right hip pain after sustaining a fall. She is morbidly obese and sleeps in a recliner. When she was getting up out of the recliner her legs gave way and she fell over onto her right hip. She denies any head injury. She denies any neck pain. She does have some pain in her low back. She has chronic pain in her right hip but says it's worse today. She has chronic edema in her lower chin and knees. She has a home health nurse coming out every other day to change pressure dressings in her legs. She states she's unable to get an elevated due to her weight. She denies any other injuries from the fall.  Patient is a 67 y.o. female presenting with fall, hip pain, and back pain.  Fall Pertinent negatives include no chest pain, no abdominal pain, no headaches and no shortness of breath.  Hip Pain Pertinent negatives include no chest pain, no abdominal pain, no headaches and no shortness of breath.  Back Pain Associated symptoms: no abdominal pain, no chest pain, no fever, no headaches, no numbness and no weakness    Past Medical History  Diagnosis Date  . Anemia   . Arthritis   . Asthma   . Diabetes mellitus   . Hypertension   . Urinary incontinence   . Pneumonia   . Degenerative disc disease   . Diabetic neuropathy   . Lymphedema    Past Surgical History  Procedure Laterality Date  . Abdominal hysterectomy      COMPLETE   Family History  Problem Relation Age of Onset  . Adopted: Yes   History  Substance Use Topics  . Smoking status: Never Smoker   . Smokeless tobacco: Never Used  . Alcohol Use: Yes     Comment: occassionally   OB History   Grav Para  Term Preterm Abortions TAB SAB Ect Mult Living                 Review of Systems  Constitutional: Negative for fever, chills, diaphoresis and fatigue.  HENT: Negative for congestion, rhinorrhea and sneezing.   Eyes: Negative.   Respiratory: Negative for cough, chest tightness and shortness of breath.   Cardiovascular: Positive for leg swelling. Negative for chest pain.  Gastrointestinal: Negative for nausea, vomiting, abdominal pain, diarrhea and blood in stool.  Genitourinary: Negative for frequency, hematuria, flank pain and difficulty urinating.  Musculoskeletal: Positive for back pain and arthralgias.  Skin: Negative for rash.  Neurological: Negative for dizziness, speech difficulty, weakness, numbness and headaches.    Allergies  Review of patient's allergies indicates no known allergies.  Home Medications   Current Outpatient Rx  Name  Route  Sig  Dispense  Refill  . albuterol (PROVENTIL HFA;VENTOLIN HFA) 108 (90 BASE) MCG/ACT inhaler   Inhalation   Inhale 2 puffs into the lungs every 6 (six) hours as needed.   18 g   1   . atorvastatin (LIPITOR) 20 MG tablet   Oral   Take 20 mg by mouth daily.           Marland Kitchen glipiZIDE-metformin (METAGLIP) 5-500 MG per tablet   Oral   Take 2 tablets  by mouth 2 (two) times daily before a meal.         . HYDROcodone-acetaminophen (NORCO/VICODIN) 5-325 MG per tablet   Oral   Take 2 tablets by mouth every 4 (four) hours as needed for pain.   15 tablet   0   . lisinopril (PRINIVIL,ZESTRIL) 40 MG tablet   Oral   Take 1 tablet (40 mg total) by mouth daily.   90 tablet   0   . ONETOUCH DELICA LANCETS MISC      Test once daily   100 each   1   . sitaGLIPtin (JANUVIA) 100 MG tablet   Oral   Take 1 tablet (100 mg total) by mouth daily.   90 tablet   0    There were no vitals taken for this visit. Physical Exam  Constitutional: She is oriented to person, place, and time. She appears well-developed and well-nourished.   Patient is morbidly obese  HENT:  Head: Normocephalic and atraumatic.  Eyes: Pupils are equal, round, and reactive to light.  Neck: Normal range of motion. Neck supple.  She has no pain along her cervical or thoracic spine there some moderate tenderness to her lower lumbosacral spine. No step-offs or deformities are noted  Cardiovascular: Normal rate, regular rhythm and normal heart sounds.   Pulmonary/Chest: Effort normal and breath sounds normal. No respiratory distress. She has no wheezes. She has no rales. She exhibits no tenderness.  Abdominal: Soft. Bowel sounds are normal. There is no tenderness. There is no rebound and no guarding.  Musculoskeletal: Normal range of motion. She exhibits no edema.  She has some tenderness on range of motion of the right hip. There is no obvious pain to the knee. I unable to palpate pulses in her right foot due to her large legs but she has no discoloration, cyanosis or temperature change in her foot. She has profuse edema in both of her lower chin nevus skin changes consistent with chronic venous stasis. She has some serous oozing of both of her legs. She has dressings to both of her legs.  Lymphadenopathy:    She has no cervical adenopathy.  Neurological: She is alert and oriented to person, place, and time.  Skin: Skin is warm and dry. No rash noted.  Psychiatric: She has a normal mood and affect.    ED Course  Procedures (including critical care time) Labs Reviewed - No data to display Dg Lumbar Spine Complete  01/16/2013   *RADIOLOGY REPORT*  Clinical Data: [place].  Right-sided pain.  LUMBAR SPINE - COMPLETE 4+ VIEW  Comparison: None.  Findings: No evidence of fracture or traumatic malalignment.  There is extensive chronic appearing degenerative change in the lower lumbar spine with disc space narrowing and widespread facet arthropathy.  There are prominent anterior bridging osteophytes. Incidental note is made of a calcification in the right abdomen  probably representing a gallstone.  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  No evidence of acute or traumatic finding.   Original Report Authenticated By: Nelson Chimes, M.D.   Dg Hip Complete Right  01/16/2013   *RADIOLOGY REPORT*  Clinical Data: Fall.  Right-sided pain.  RIGHT HIP - COMPLETE 2+ VIEW  Comparison: None.  Findings: No evidence of pelvic or hip fracture.  There is mild joint space narrowing and osteophyte formation.  Bones of the pelvis do not show any acute or traumatic finding.  Mild degenerative change of the sacroiliac joints is noted.  IMPRESSION: No  acute finding.  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   Original Report Authenticated By: Nelson Chimes, M.D.   1. Contusion, hip, right, initial encounter   2. Back strain, initial encounter     MDM  No evidence of fractures.  Her legs have changes consistent with chronic edema.  These were rewrapped in ED.  She was given a rx for vicodin to use for the pain.  I advised her to use a stool softener along with that. I advised her followup with her primary care physician within the next few days.  Malvin Johns, MD 01/16/13 1055

## 2013-01-16 NOTE — ED Notes (Signed)
[facility] has been called to transfer patient home.

## 2013-01-23 ENCOUNTER — Inpatient Hospital Stay (HOSPITAL_COMMUNITY)
Admission: EM | Admit: 2013-01-23 | Discharge: 2013-01-27 | DRG: 603 | Disposition: A | Discharge status: Other Acute Inpatient Hospital | Payer: Medicare Other | Attending: Internal Medicine | Admitting: Internal Medicine

## 2013-01-23 ENCOUNTER — Encounter (HOSPITAL_COMMUNITY): Payer: Self-pay | Admitting: Emergency Medicine

## 2013-01-23 DIAGNOSIS — I890 Lymphedema, not elsewhere classified: Secondary | ICD-10-CM | POA: Diagnosis present

## 2013-01-23 DIAGNOSIS — N390 Urinary tract infection, site not specified: Secondary | ICD-10-CM | POA: Diagnosis present

## 2013-01-23 DIAGNOSIS — J45909 Unspecified asthma, uncomplicated: Secondary | ICD-10-CM | POA: Diagnosis present

## 2013-01-23 DIAGNOSIS — E785 Hyperlipidemia, unspecified: Secondary | ICD-10-CM | POA: Diagnosis present

## 2013-01-23 DIAGNOSIS — E1140 Type 2 diabetes mellitus with diabetic neuropathy, unspecified: Secondary | ICD-10-CM | POA: Diagnosis present

## 2013-01-23 DIAGNOSIS — L03119 Cellulitis of unspecified part of limb: Secondary | ICD-10-CM | POA: Diagnosis present

## 2013-01-23 DIAGNOSIS — D649 Anemia, unspecified: Secondary | ICD-10-CM | POA: Diagnosis present

## 2013-01-23 DIAGNOSIS — E1149 Type 2 diabetes mellitus with other diabetic neurological complication: Secondary | ICD-10-CM | POA: Diagnosis present

## 2013-01-23 DIAGNOSIS — N179 Acute kidney failure, unspecified: Secondary | ICD-10-CM | POA: Diagnosis present

## 2013-01-23 DIAGNOSIS — Z7409 Other reduced mobility: Secondary | ICD-10-CM

## 2013-01-23 DIAGNOSIS — Z6841 Body Mass Index (BMI) 40.0 and over, adult: Secondary | ICD-10-CM

## 2013-01-23 DIAGNOSIS — L97909 Non-pressure chronic ulcer of unspecified part of unspecified lower leg with unspecified severity: Secondary | ICD-10-CM | POA: Diagnosis present

## 2013-01-23 DIAGNOSIS — I10 Essential (primary) hypertension: Secondary | ICD-10-CM | POA: Diagnosis present

## 2013-01-23 DIAGNOSIS — L97911 Non-pressure chronic ulcer of unspecified part of right lower leg limited to breakdown of skin: Secondary | ICD-10-CM

## 2013-01-23 DIAGNOSIS — L97919 Non-pressure chronic ulcer of unspecified part of right lower leg with unspecified severity: Secondary | ICD-10-CM | POA: Diagnosis present

## 2013-01-23 DIAGNOSIS — E860 Dehydration: Secondary | ICD-10-CM

## 2013-01-23 DIAGNOSIS — L97929 Non-pressure chronic ulcer of unspecified part of left lower leg with unspecified severity: Secondary | ICD-10-CM | POA: Diagnosis present

## 2013-01-23 DIAGNOSIS — L02419 Cutaneous abscess of limb, unspecified: Principal | ICD-10-CM | POA: Diagnosis present

## 2013-01-23 DIAGNOSIS — T46905A Adverse effect of unspecified agents primarily affecting the cardiovascular system, initial encounter: Secondary | ICD-10-CM | POA: Diagnosis present

## 2013-01-23 DIAGNOSIS — R69 Illness, unspecified: Secondary | ICD-10-CM

## 2013-01-23 DIAGNOSIS — R739 Hyperglycemia, unspecified: Secondary | ICD-10-CM

## 2013-01-23 DIAGNOSIS — E1142 Type 2 diabetes mellitus with diabetic polyneuropathy: Secondary | ICD-10-CM

## 2013-01-23 LAB — CBC WITH DIFFERENTIAL/PLATELET
Basophils Relative: 1 % (ref 0–1)
Eosinophils Absolute: 0.1 10*3/uL (ref 0.0–0.7)
Hemoglobin: 11.9 g/dL — ABNORMAL LOW (ref 12.0–15.0)
Lymphs Abs: 1.9 10*3/uL (ref 0.7–4.0)
MCH: 26.3 pg (ref 26.0–34.0)
Monocytes Relative: 12 % (ref 3–12)
Neutro Abs: 4.8 10*3/uL (ref 1.7–7.7)
Neutrophils Relative %: 61 % (ref 43–77)
Platelets: 324 10*3/uL (ref 150–400)
RBC: 4.53 MIL/uL (ref 3.87–5.11)

## 2013-01-23 LAB — URINALYSIS, ROUTINE W REFLEX MICROSCOPIC
Bilirubin Urine: NEGATIVE
Glucose, UA: >1000 mg/dL — AB
Hgb urine dipstick: NEGATIVE
Ketones, ur: NEGATIVE mg/dL
pH: 5 (ref 5.0–8.0)

## 2013-01-23 LAB — URINE MICROSCOPIC-ADD ON

## 2013-01-23 LAB — BASIC METABOLIC PANEL
BUN: 35 mg/dL — ABNORMAL HIGH (ref 6–23)
Chloride: 95 mEq/L — ABNORMAL LOW (ref 96–112)
GFR calc Af Amer: 53 mL/min — ABNORMAL LOW (ref >90)
GFR calc non Af Amer: 46 mL/min — ABNORMAL LOW (ref >90)
Glucose, Bld: 423 mg/dL — ABNORMAL HIGH (ref 70–99)
Potassium: 4.4 mEq/L (ref 3.5–5.1)
Sodium: 131 mEq/L — ABNORMAL LOW (ref 135–145)

## 2013-01-23 MED ORDER — PIPERACILLIN-TAZOBACTAM 3.375 G IVPB
3.3750 g | Freq: Once | INTRAVENOUS | Status: AC
  Administered 2013-01-23: 3.375 g via INTRAVENOUS
  Filled 2013-01-23: qty 50

## 2013-01-23 MED ORDER — LOSARTAN POTASSIUM 50 MG PO TABS
100.0000 mg | ORAL_TABLET | Freq: Every day | ORAL | Status: DC
  Administered 2013-01-24: 100 mg via ORAL
  Filled 2013-01-23: qty 2

## 2013-01-23 MED ORDER — OXYCODONE-ACETAMINOPHEN 5-325 MG PO TABS
1.0000 | ORAL_TABLET | Freq: Four times a day (QID) | ORAL | Status: DC | PRN
  Administered 2013-01-24 (×2): 2 via ORAL
  Filled 2013-01-23 (×2): qty 2

## 2013-01-23 MED ORDER — INSULIN ASPART 100 UNIT/ML ~~LOC~~ SOLN
15.0000 [IU] | Freq: Once | SUBCUTANEOUS | Status: AC
  Administered 2013-01-23: 15 [IU] via SUBCUTANEOUS
  Filled 2013-01-23: qty 1

## 2013-01-23 MED ORDER — GABAPENTIN 300 MG PO CAPS
300.0000 mg | ORAL_CAPSULE | Freq: Three times a day (TID) | ORAL | Status: DC
  Administered 2013-01-24 – 2013-01-27 (×12): 300 mg via ORAL
  Filled 2013-01-23 (×13): qty 1

## 2013-01-23 MED ORDER — ALBUTEROL SULFATE HFA 108 (90 BASE) MCG/ACT IN AERS
2.0000 | INHALATION_SPRAY | Freq: Four times a day (QID) | RESPIRATORY_TRACT | Status: DC | PRN
  Filled 2013-01-23: qty 6.7

## 2013-01-23 MED ORDER — ATORVASTATIN CALCIUM 20 MG PO TABS
20.0000 mg | ORAL_TABLET | Freq: Every day | ORAL | Status: DC
  Administered 2013-01-24: 20 mg via ORAL
  Filled 2013-01-23 (×2): qty 1

## 2013-01-23 MED ORDER — SENNOSIDES-DOCUSATE SODIUM 8.6-50 MG PO TABS
1.0000 | ORAL_TABLET | Freq: Every evening | ORAL | Status: DC | PRN
  Filled 2013-01-23: qty 1

## 2013-01-23 MED ORDER — FENTANYL CITRATE 0.05 MG/ML IJ SOLN
50.0000 ug | Freq: Once | INTRAMUSCULAR | Status: AC
Start: 2013-01-23 — End: 2013-01-23
  Administered 2013-01-23: 50 ug via INTRAMUSCULAR
  Filled 2013-01-23: qty 2

## 2013-01-23 MED ORDER — ONDANSETRON HCL 4 MG/2ML IJ SOLN: 4.0000 mg | Freq: Four times a day (QID) | INTRAMUSCULAR | Status: DC | PRN

## 2013-01-23 MED ORDER — SODIUM CHLORIDE 0.9 % IV SOLN
INTRAVENOUS | Status: DC
  Administered 2013-01-24: via INTRAVENOUS

## 2013-01-23 MED ORDER — ONDANSETRON HCL 4 MG PO TABS: 4.0000 mg | ORAL_TABLET | Freq: Four times a day (QID) | ORAL | Status: DC | PRN

## 2013-01-23 MED ORDER — GLIPIZIDE-METFORMIN HCL 5-500 MG PO TABS: 2.0000 | ORAL_TABLET | Freq: Two times a day (BID) | ORAL | Status: DC

## 2013-01-23 MED ORDER — HYDROCODONE-ACETAMINOPHEN 5-325 MG PO TABS: 1.0000 | ORAL_TABLET | ORAL | Status: DC | PRN

## 2013-01-23 MED ORDER — SODIUM CHLORIDE 0.9 % IV BOLUS (SEPSIS)
1000.0000 mL | Freq: Once | INTRAVENOUS | Status: AC
  Administered 2013-01-23: 1000 mL via INTRAVENOUS

## 2013-01-23 MED ORDER — MORPHINE SULFATE 2 MG/ML IJ SOLN: 2.0000 mg | INTRAMUSCULAR | Status: DC | PRN

## 2013-01-23 MED ORDER — PIPERACILLIN-TAZOBACTAM 3.375 G IVPB
3.3750 g | Freq: Three times a day (TID) | INTRAVENOUS | Status: DC
  Administered 2013-01-24 – 2013-01-27 (×10): 3.375 g via INTRAVENOUS
  Filled 2013-01-23 (×12): qty 50

## 2013-01-23 MED ORDER — HYDROMORPHONE HCL PF 1 MG/ML IJ SOLN
1.0000 mg | Freq: Once | INTRAMUSCULAR | Status: AC
  Administered 2013-01-23: 1 mg via INTRAVENOUS
  Filled 2013-01-23: qty 1

## 2013-01-23 MED ORDER — ACETAMINOPHEN 650 MG RE SUPP: 650.0000 mg | Freq: Four times a day (QID) | RECTAL | Status: DC | PRN

## 2013-01-23 MED ORDER — DOCUSATE SODIUM 100 MG PO CAPS
100.0000 mg | ORAL_CAPSULE | Freq: Two times a day (BID) | ORAL | Status: DC
  Administered 2013-01-24 – 2013-01-27 (×5): 100 mg via ORAL
  Filled 2013-01-23 (×9): qty 1

## 2013-01-23 MED ORDER — VANCOMYCIN HCL IN DEXTROSE 1-5 GM/200ML-% IV SOLN
1000.0000 mg | Freq: Once | INTRAVENOUS | Status: AC
  Administered 2013-01-23: 1000 mg via INTRAVENOUS
  Filled 2013-01-23 (×2): qty 200

## 2013-01-23 MED ORDER — METFORMIN HCL 500 MG PO TABS
500.0000 mg | ORAL_TABLET | Freq: Two times a day (BID) | ORAL | Status: DC
  Administered 2013-01-24: 500 mg via ORAL
  Filled 2013-01-23 (×3): qty 1

## 2013-01-23 MED ORDER — INSULIN ASPART 100 UNIT/ML ~~LOC~~ SOLN
0.0000 [IU] | Freq: Three times a day (TID) | SUBCUTANEOUS | Status: DC
  Administered 2013-01-24: 11 [IU] via SUBCUTANEOUS
  Administered 2013-01-24 (×2): 4 [IU] via SUBCUTANEOUS
  Administered 2013-01-25 (×2): 11 [IU] via SUBCUTANEOUS
  Administered 2013-01-25: 7 [IU] via SUBCUTANEOUS
  Administered 2013-01-26: 4 [IU] via SUBCUTANEOUS
  Administered 2013-01-26 (×2): 7 [IU] via SUBCUTANEOUS
  Administered 2013-01-27: 4 [IU] via SUBCUTANEOUS
  Administered 2013-01-27: 7 [IU] via SUBCUTANEOUS

## 2013-01-23 MED ORDER — TRAZODONE HCL 50 MG PO TABS
50.0000 mg | ORAL_TABLET | Freq: Every evening | ORAL | Status: DC | PRN
  Filled 2013-01-23: qty 1

## 2013-01-23 MED ORDER — LINAGLIPTIN 5 MG PO TABS
5.0000 mg | ORAL_TABLET | Freq: Every day | ORAL | Status: DC
  Administered 2013-01-24 – 2013-01-27 (×3): 5 mg via ORAL
  Filled 2013-01-23 (×4): qty 1

## 2013-01-23 MED ORDER — INSULIN ASPART 100 UNIT/ML ~~LOC~~ SOLN
0.0000 [IU] | Freq: Every day | SUBCUTANEOUS | Status: DC
  Administered 2013-01-24: 2 [IU] via SUBCUTANEOUS
  Administered 2013-01-24: 4 [IU] via SUBCUTANEOUS
  Administered 2013-01-26: 2 [IU] via SUBCUTANEOUS
  Administered 2013-01-26: 4 [IU] via SUBCUTANEOUS

## 2013-01-23 MED ORDER — SODIUM CHLORIDE 0.9 % IV BOLUS (SEPSIS): 500.0000 mL | Freq: Once | INTRAVENOUS | Status: DC

## 2013-01-23 MED ORDER — ACETAMINOPHEN 325 MG PO TABS: 650.0000 mg | ORAL_TABLET | Freq: Four times a day (QID) | ORAL | Status: DC | PRN

## 2013-01-23 MED ORDER — HEPARIN SODIUM (PORCINE) 5000 UNIT/ML IJ SOLN
5000.0000 [IU] | Freq: Three times a day (TID) | INTRAMUSCULAR | Status: DC
  Administered 2013-01-24 – 2013-01-27 (×12): 5000 [IU] via SUBCUTANEOUS
  Filled 2013-01-23 (×14): qty 1

## 2013-01-23 MED ORDER — GLIPIZIDE 5 MG PO TABS
5.0000 mg | ORAL_TABLET | Freq: Two times a day (BID) | ORAL | Status: DC
  Administered 2013-01-24 – 2013-01-27 (×7): 5 mg via ORAL
  Filled 2013-01-23 (×9): qty 1

## 2013-01-23 MED ORDER — INSULIN DETEMIR 100 UNIT/ML ~~LOC~~ SOLN
15.0000 [IU] | Freq: Every day | SUBCUTANEOUS | Status: DC
  Filled 2013-01-23 (×2): qty 0.15

## 2013-01-23 MED ORDER — SODIUM CHLORIDE 0.9 % IV SOLN
Freq: Once | INTRAVENOUS | Status: AC
  Administered 2013-01-23: 22:00:00 via INTRAVENOUS

## 2013-01-23 NOTE — ED Notes (Signed)
Per EMS pt has been dealing with lower leg wounds for two months after she bumped both legs on her chair.  Pt is also diabetic but hasnt been checking her blood sugar due to financial reasons of not being able to afford the testing strips.  Pt PMh HTN and didn't take her meds this am.  Pt has been sitting in a chair for weeks/month due to pain on back of legs when the have pressure applied to them.

## 2013-01-23 NOTE — H&P (Addendum)
[facility] History and Physical  Jodi Underwood [id number] DOB: 1946/07/09 DOA: 01/23/2013  Referring physician: Audie Pinto PCP: Antony Blackbird, MD   Chief Complaint: leg pain  HPI: Jodi Underwood is a 67 y.o. female with chronic leg swelling, morbid obesity and diabetes. She presents to the emergency room with worsening bilateral leg pain. She is unable to sleep due to the pain. She has been taking hydrocodone without relief. She lives alone and has a difficult time getting around. She has been sleeping in a recliner.  For the past few weeks, home health nursing has been applying compression dressings twice weekly. Patient reports that they have been soaked with drainage. Apparently she hit the back of her legs on her wheelchair and since then has also had nonhealing wounds. She was recently started on gabapentin for neuropathy, but is still at quite a low dose. Her pain is burning and aching in her feet and both legs. No known fevers or chills. Patient reports that the drainage has been clear up until recently but started turning "brown". Family members report that her legs and feet are more red and swollen and then a few months ago. Blood sugar was noted to be above 400. She stopped checking her blood sugars several weeks ago, as she was unable to afford the strips. Prior to this, she claims her blood sugars were running "130". Recent hemoglobin A1c is unknown.  Review of Systems: Systems reviewed and as above otherwise negative  Past Medical History  Diagnosis Date  . Anemia   . Arthritis   . Asthma   . Diabetes mellitus   . Hypertension   . Urinary incontinence   . Pneumonia   . Degenerative disc disease   . Diabetic neuropathy   . Lymphedema    Past Surgical History  Procedure Laterality Date  . Abdominal hysterectomy      COMPLETE   Social History:  reports that she has never smoked. She has never used smokeless tobacco. She reports that  drinks alcohol. She reports  that she does not use illicit drugs. patient lives alone. Home nursing has been arranged. Gets around by wheelchair.  No Known Allergies  Family History  Problem Relation Age of Onset  . Adopted: Yes    Prior to Admission medications   Medication Sig Start Date End Date Taking? Authorizing Provider  albuterol (PROVENTIL HFA;VENTOLIN HFA) 108 (90 BASE) MCG/ACT inhaler Inhale 2 puffs into the lungs every 6 (six) hours as needed. 07/24/11  Yes [name], MD  cephALEXin (KEFLEX) 500 MG capsule Take 500 mg by mouth 3 (three) times daily. For 10 days. 01/23/13  Yes Historical Provider, MD  gabapentin (NEURONTIN) 100 MG capsule Take 100-200 mg by mouth 2 (two) times daily.   Yes Historical Provider, MD  glipiZIDE-metformin (METAGLIP) 5-500 MG per tablet Take 2 tablets by mouth 2 (two) times daily before a meal. 10/02/11  Yes [name], MD  HYDROcodone-acetaminophen (NORCO/VICODIN) 5-325 MG per tablet Take 2 tablets by mouth every 4 (four) hours as needed for pain. 01/16/13  Yes Malvin Johns, MD  losartan (COZAAR) 100 MG tablet Take 100 mg by mouth daily.   Yes Historical Provider, MD  naproxen sodium (ANAPROX) 220 MG tablet Take 220 mg by mouth 2 (two) times daily with a meal.   Yes Historical Provider, MD  atorvastatin (LIPITOR) 20 MG tablet Take 20 mg by mouth daily.      Historical Provider, MD  sitaGLIPtin (JANUVIA) 100 MG tablet Take 1 tablet (  100 mg total) by mouth daily. 10/06/11   [name], MD   Physical Exam: Filed Vitals:   01/23/13 1757  BP: 158/70  Pulse: 116  Temp: 97.7 F (36.5 C)  TempSrc: Oral  Resp: 20  SpO2: 100%   BP 158/70  Pulse 116  Temp(Src) 97.7 F (36.5 C) (Oral)  Resp 20  SpO2 100%  General Appearance:    Morbidly obese AA female who appears to be in a moderate amount of pain.   Head:    Normocephalic, without obvious abnormality, atraumatic  Eyes:    PERRL, conjunctiva/corneas clear, EOM's intact, fundi    benign, both eyes      Nose:   Nares normal, septum midline, mucosa normal, no drainage    or sinus tenderness  Throat:   Lips, mucosa, and tongue normal; teeth and gums normal  Neck:   Supple, symmetrical, trachea midline, no adenopathy;    thyroid:  no enlargement/tenderness/nodules; no carotid   bruit or JVD  Back:     Symmetric, no curvature, ROM normal, no CVA tenderness  Lungs:     Clear to auscultation bilaterally, respirations unlabored  Chest Wall:    No tenderness or deformity   Heart:    Regular rate and rhythm, S1 and S2 normal, no murmur, rub   or gallop  Breast Exam:    No tenderness, masses, or nipple abnormality  Abdomen:     Obese, soft, nontender. Bowel sounds present  Genitalia:   deferred  Rectal:   deferred  Extremities:   maceration and ulceration present bilaterally distal to the knee with surrounding erythema. Serosanguineous drainage and malodor. dorsum of both feet erythematous and edematous. Hyperpigmentation present bilaterally in the pretibial area is   Pulses:   pedal pulses difficult to palpate   Skin:   See above   Lymph nodes:   Cervical, supraclavicular, and axillary nodes normal  Neurologic:   CNII-XII intact, normal strength, sensation and reflexes    throughout    Psychiatric: Occasionally tearful and anxious appearing. Cooperative    Labs on Admission:  Basic Metabolic Panel:  Recent Labs Lab 01/23/13 1945  NA 131*  K 4.4  CL 95*  CO2 25  GLUCOSE 423*  BUN 35*  CREATININE 1.20*  CALCIUM 9.6   Liver Function Tests: No results found for this basename: AST, ALT, ALKPHOS, BILITOT, PROT, ALBUMIN,  in the last 168 hours No results found for this basename: LIPASE, AMYLASE,  in the last 168 hours No results found for this basename: AMMONIA,  in the last 168 hours CBC:  Recent Labs Lab 01/23/13 1945  WBC 7.8  NEUTROABS 4.8  HGB 11.9*  HCT 36.8  MCV 81.2  PLT 324   Cardiac Enzymes: No results found for this basename: CKTOTAL, CKMB, CKMBINDEX, TROPONINI,   in the last 168 hours  BNP (last 3 results) No results found for this basename: PROBNP,  in the last 8760 hours CBG: No results found for this basename: GLUCAP,  in the last 168 hours   Assessment/Plan    Cellulitis of leg, bilateral: Likely polymicrobial in this patient with uncontrolled diabetes and morbid obesity. Will continue vancomycin and Zosyn. Keep legs elevated. Will get wound care consult. It is unclear whether the swelling is lymphedema, versus venous stasis, versus obesity. We'll check TSH, albumin, urinalysis to investigate other etiologies of her chronic edema. Likely has a component of obesity hypoventilation/obstructive sleep apnea/right heart failure. Echo would not be helpful in her case due to  body habitus. Her main complaint is pain and I will give oral and IV pain medications as needed. Pain is likely due to peripheral neuropathy and will increase gabapentin to 300 mg 3 times a day. May need to titrate upward. WOC consult    DM type 2, uncontrolled, with neuropathy: Will give levemir and titrate. Resume outpatient medications and check hemoglobin A1c.    Morbid obesity With severe mobility problems. Will get a physical therapy evaluation. I suspect she will need a higher level of care.    HYPERTENSION    ASTHMA, stable    Hyperlipidemia    Ulcers of both lower extremities  Code Status: full Family Communication: daughters at bedside Disposition Plan: anticipate SNF  Time spent: 56 minutes  [facility] Pager [phone]  If 7PM-7AM, please contact night-coverage [email or web address] Password [facility] 01/23/2013, 11:24 PM

## 2013-01-23 NOTE — ED Notes (Signed)
Attempted to call report to 3E  Nurse unable to take report will call back

## 2013-01-23 NOTE — ED Provider Notes (Signed)
History    CSN: [id number] Arrival date & time 01/23/13  1744  First MD Initiated Contact with Patient 01/23/13 1801     Chief Complaint  Patient presents with  . Wound Infection   (Consider location/radiation/quality/duration/timing/severity/associated sxs/prior Treatment) HPI  Patient is a 67 year old female past history significant for HTN, chronic Lymphedema, DM presented to emergency department for over a month of worsening bilateral lower leg pain. Patient states she was evaluated by her podiatrist and has been started on wraps for her lymphedema. Over the last 2 days since her last dressing change patient has noted increased brown discoloration and discharge is increased itching and burning pain. Patient rates her pain 10 out of 10. She states it is worsened with movement and has been sitting in her recliner from entire past month. Denies any alleviating factors. Patient also states she's had subjective chills and fevers, increased difficulty managing her blood sugars. Patient states they have been running in the 300s at home recently and this is atypical for her.   Past Medical History  Diagnosis Date  . Anemia   . Arthritis   . Asthma   . Diabetes mellitus   . Hypertension   . Urinary incontinence   . Pneumonia   . Degenerative disc disease   . Diabetic neuropathy   . Lymphedema    Past Surgical History  Procedure Laterality Date  . Abdominal hysterectomy      COMPLETE   Family History  Problem Relation Age of Onset  . Adopted: Yes   History  Substance Use Topics  . Smoking status: Never Smoker   . Smokeless tobacco: Never Used  . Alcohol Use: Yes     Comment: occassionally   OB History   Grav Para Term Preterm Abortions TAB SAB Ect Mult Living                 Review of Systems  Constitutional: Positive for fever and chills.  HENT: Negative.   Eyes: Negative.   Respiratory: Negative for shortness of breath.   Cardiovascular: Positive for leg swelling.  Negative for chest pain.  Musculoskeletal: Positive for back pain and gait problem.  Skin: Positive for color change and wound.  Allergic/Immunologic: Positive for immunocompromised state.  All other systems reviewed and are negative.    Allergies  Review of patient's allergies indicates no known allergies.  Home Medications   No current outpatient prescriptions on file. BP 158/70  Pulse 116  Temp(Src) 97.7 F (36.5 C) (Oral)  Resp 20  SpO2 100% Physical Exam  Constitutional: She is oriented to person, place, and time. She appears well-developed and well-nourished. No distress.  HENT:  Head: Normocephalic and atraumatic.  Eyes: Conjunctivae are normal.  Neck: Neck supple.  Cardiovascular: Normal rate, regular rhythm and normal heart sounds.   Pulmonary/Chest: Effort normal and breath sounds normal. No respiratory distress.  Abdominal: Soft. There is no tenderness.  Musculoskeletal: She exhibits edema.  Neurological: She is alert and oriented to person, place, and time.  Skin: Skin is warm and dry. She is not diaphoretic. There is erythema.  Weeping wounds on bilateral posterior LEs.    Psychiatric: She has a normal mood and affect.    ED Course  Procedures (including critical care time)  Medications  vancomycin (VANCOCIN) IVPB 1000 mg/200 mL premix (1,000 mg Intravenous Given 01/23/13 2354)  losartan (COZAAR) tablet 100 mg (not administered)  linagliptin (TRADJENTA) tablet 5 mg (not administered)  albuterol (PROVENTIL HFA;VENTOLIN HFA) 108 (90 BASE) MCG/ACT  inhaler 2 puff (not administered)  atorvastatin (LIPITOR) tablet 20 mg (not administered)  heparin injection 5,000 Units (not administered)  acetaminophen (TYLENOL) tablet 650 mg (not administered)    Or  acetaminophen (TYLENOL) suppository 650 mg (not administered)  traZODone (DESYREL) tablet 50 mg (not administered)  senna-docusate (Senokot-S) tablet 1 tablet (not administered)  ondansetron (ZOFRAN) tablet 4 mg  (not administered)    Or  ondansetron (ZOFRAN) injection 4 mg (not administered)  insulin detemir (LEVEMIR) injection 15 Units (not administered)  insulin aspart (novoLOG) injection 0-5 Units (not administered)  insulin aspart (novoLOG) injection 0-20 Units (not administered)  glipiZIDE (GLUCOTROL) tablet 5 mg (not administered)  metFORMIN (GLUCOPHAGE) tablet 500 mg (not administered)  piperacillin-tazobactam (ZOSYN) IVPB 3.375 g (not administered)  docusate sodium (COLACE) capsule 100 mg (not administered)  morphine 2 MG/ML injection 2-4 mg (not administered)  gabapentin (NEURONTIN) capsule 300 mg (not administered)  oxyCODONE-acetaminophen (PERCOCET/ROXICET) 5-325 MG per tablet 1-2 tablet (not administered)  fentaNYL (SUBLIMAZE) injection 50 mcg (50 mcg Intramuscular Given 01/23/13 1859)  HYDROmorphone (DILAUDID) injection 1 mg (1 mg Intravenous Given 01/23/13 2002)  0.9 %  sodium chloride infusion ( Intravenous New Bag/Given 01/23/13 2221)  sodium chloride 0.9 % bolus 1,000 mL (0 mLs Intravenous Stopped 01/23/13 2220)  piperacillin-tazobactam (ZOSYN) IVPB 3.375 g (3.375 g Intravenous New Bag/Given 01/23/13 2220)  insulin aspart (novoLOG) injection 15 Units (15 Units Subcutaneous Given 01/23/13 2225)  HYDROmorphone (DILAUDID) injection 1 mg (1 mg Intravenous Given 01/23/13 2352)    Labs Reviewed  CBC WITH DIFFERENTIAL - Abnormal; Notable for the following:    Hemoglobin 11.9 (*)    All other components within normal limits  BASIC METABOLIC PANEL - Abnormal; Notable for the following:    Sodium 131 (*)    Chloride 95 (*)    Glucose, Bld 423 (*)    BUN 35 (*)    Creatinine, Ser 1.20 (*)    GFR calc non Af Amer 46 (*)    GFR calc Af Amer 53 (*)    All other components within normal limits  SEDIMENTATION RATE - Abnormal; Notable for the following:    Sed Rate 42 (*)    All other components within normal limits  HEPATIC FUNCTION PANEL - Abnormal; Notable for the following:    Albumin 3.4  (*)    All other components within normal limits  URINALYSIS, ROUTINE W REFLEX MICROSCOPIC - Abnormal; Notable for the following:    APPearance CLOUDY (*)    Specific Gravity, Urine 1.032 (*)    Glucose, UA >1000 (*)    Leukocytes, UA SMALL (*)    All other components within normal limits  URINE MICROSCOPIC-ADD ON - Abnormal; Notable for the following:    Bacteria, UA FEW (*)    All other components within normal limits  URINE CULTURE  TSH  HEMOGLOBIN A1C   No results found. 1. Cellulitis of multiple sites of lower extremity   2. Dehydration   3. Hyperglycemia without ketosis   4. Lymphedema   5. DM type 2, uncontrolled, with neuropathy   6. Mobility impaired   7. Morbid obesity   8. Unspecified essential hypertension   9. Ulcers of both lower extremities, with unspecified severity     MDM  Pt presenting with multiple complaints. PE revealed multiple ulcers of bilateral LE w/ surrounding erythema and skin degradation. Labs reviewed, pt noted to be hyperglycemia and dehydrated. IVF and Novolog given. Patient will be admitted to the hospitalist for further evaluation. Patient d/w  with Dr. Audie Pinto, agrees with plan. The patient appears reasonably stabilized for admission considering the current resources, flow, and capabilities available in the ED at this time, and I doubt any other [facility] requiring further screening and/or treatment in the ED prior to admission.    Harlow Mares, PA-C 01/24/13 0007

## 2013-01-24 DIAGNOSIS — J45909 Unspecified asthma, uncomplicated: Secondary | ICD-10-CM

## 2013-01-24 LAB — HEPATIC FUNCTION PANEL
ALT: 17 U/L (ref 0–35)
AST: 20 U/L (ref 0–37)
Albumin: 3.4 g/dL — ABNORMAL LOW (ref 3.5–5.2)
Alkaline Phosphatase: 86 U/L (ref 39–117)
Total Protein: 7.7 g/dL (ref 6.0–8.3)

## 2013-01-24 LAB — GLUCOSE, CAPILLARY
Glucose-Capillary: 196 mg/dL — ABNORMAL HIGH (ref 70–99)
Glucose-Capillary: 210 mg/dL — ABNORMAL HIGH (ref 70–99)
Glucose-Capillary: 348 mg/dL — ABNORMAL HIGH (ref 70–99)

## 2013-01-24 MED ORDER — POLYETHYLENE GLYCOL 3350 17 G PO PACK
17.0000 g | PACK | Freq: Two times a day (BID) | ORAL | Status: DC
  Administered 2013-01-26 – 2013-01-27 (×2): 17 g via ORAL
  Filled 2013-01-24 (×7): qty 1

## 2013-01-24 MED ORDER — PNEUMOCOCCAL VAC POLYVALENT 25 MCG/0.5ML IJ INJ
0.5000 mL | INJECTION | Freq: Once | INTRAMUSCULAR | Status: DC
  Filled 2013-01-24: qty 0.5

## 2013-01-24 MED ORDER — DULOXETINE HCL 30 MG PO CPEP
30.0000 mg | ORAL_CAPSULE | Freq: Every day | ORAL | Status: DC
  Administered 2013-01-24 – 2013-01-27 (×3): 30 mg via ORAL
  Filled 2013-01-24 (×4): qty 1

## 2013-01-24 MED ORDER — VANCOMYCIN HCL 10 G IV SOLR
1500.0000 mg | Freq: Two times a day (BID) | INTRAVENOUS | Status: DC
  Administered 2013-01-24 – 2013-01-25 (×3): 1500 mg via INTRAVENOUS
  Filled 2013-01-24 (×4): qty 1500

## 2013-01-24 MED ORDER — OXYCODONE HCL 5 MG PO TABS: 10.0000 mg | ORAL_TABLET | ORAL | Status: DC | PRN

## 2013-01-24 MED ORDER — LOSARTAN POTASSIUM 50 MG PO TABS
100.0000 mg | ORAL_TABLET | Freq: Every day | ORAL | Status: DC
  Administered 2013-01-25: 100 mg via ORAL
  Filled 2013-01-24: qty 2

## 2013-01-24 MED ORDER — MORPHINE SULFATE 2 MG/ML IJ SOLN: 2.0000 mg | INTRAMUSCULAR | Status: DC | PRN

## 2013-01-24 MED ORDER — PNEUMOCOCCAL VAC POLYVALENT 25 MCG/0.5ML IJ INJ
0.5000 mL | INJECTION | INTRAMUSCULAR | Status: AC
  Filled 2013-01-24 (×2): qty 0.5

## 2013-01-24 MED ORDER — BD GETTING STARTED TAKE HOME KIT: 3/10ML X 30G SYRINGES
1.0000 | Freq: Once | Status: AC
  Administered 2013-01-24: 1
  Filled 2013-01-24: qty 1

## 2013-01-24 MED ORDER — INSULIN DETEMIR 100 UNIT/ML ~~LOC~~ SOLN
20.0000 [IU] | Freq: Every day | SUBCUTANEOUS | Status: DC
  Administered 2013-01-24 – 2013-01-26 (×3): 20 [IU] via SUBCUTANEOUS
  Filled 2013-01-24 (×5): qty 0.2

## 2013-01-24 MED ORDER — OXYCODONE HCL 5 MG PO TABS
10.0000 mg | ORAL_TABLET | ORAL | Status: DC | PRN
  Administered 2013-01-24 – 2013-01-27 (×7): 10 mg via ORAL
  Filled 2013-01-24 (×7): qty 2

## 2013-01-24 MED ORDER — HYDROCERIN EX CREA
TOPICAL_CREAM | Freq: Two times a day (BID) | CUTANEOUS | Status: DC
  Administered 2013-01-24: 1 via TOPICAL
  Administered 2013-01-24 – 2013-01-27 (×6): via TOPICAL
  Filled 2013-01-24: qty 113

## 2013-01-24 NOTE — Progress Notes (Signed)
Clinical Social Work Department BRIEF PSYCHOSOCIAL ASSESSMENT 01/24/2013  Patient:  Jodi Underwood,Jodi Underwood     Account Number:  [id number]     Admit date:  01/23/2013  Clinical Social Worker:  Earlie Server  Date/Time:  01/24/2013 11:30 AM  Referred by:  Physician  Date Referred:  01/24/2013 Referred for  SNF Placement   Other Referral:   Interview type:  Patient Other interview type:    PSYCHOSOCIAL DATA Living Status:  ALONE Admitted from facility:   Level of care:   Primary support name:  Gilmore Laroche Primary support relationship to patient:  CHILD, ADULT Degree of support available:   Strong    CURRENT CONCERNS Current Concerns  Post-Acute Placement   Other Concerns:    SOCIAL WORK ASSESSMENT / PLAN CSW received referral to assist with SNF placement. CSW reviewed chart and met with patient at bedside. CSW introduced myself and explained role.    Patient reports that she has spoken to MD and PT and aware that she needs ST SNF prior to returning home. Patient reports that her friend is a therapist and will assist with choosing a facility. CSW provided SNF list and explained process along with Medicare benefits for SNF.    CSW completed FL2 and faxed out to [facility] and [employer]. CSW will follow up with bed offers.   Assessment/plan status:  Psychosocial Support/Ongoing Assessment of Needs Other assessment/ plan:   Information/referral to community resources:   SNF information    PATIENT'S/FAMILY'S RESPONSE TO PLAN OF CARE: Patient alert and oriented. Patient agreeable to SNF and hopes that one will accept her that she feels has a good rehab program. Patient agreeable to CSW follow up and thanked CSW for time.       (Coverage for [employer])

## 2013-01-24 NOTE — Progress Notes (Addendum)
Clinical Social Work Department CLINICAL SOCIAL WORK PLACEMENT NOTE 01/24/2013  Patient:  Jodi Underwood,Jodi Underwood  Account Number:  [id number] Admit date:  01/23/2013  Clinical Social Worker:  Sindy Messing, LCSW  Date/time:  01/24/2013 12:00 N  Clinical Social Work is seeking post-discharge placement for this patient at the following level of care:   SKILLED NURSING   (*CSW will update this form in Epic as items are completed)   01/24/2013  Patient/family provided with [facility] Department of Clinical Social Work's list of facilities offering this level of care within the geographic area requested by the patient (or if unable, by the patient's family).  01/24/2013  Patient/family informed of their freedom to choose among providers that offer the needed level of care, that participate in Medicare, Medicaid or managed care program needed by the patient, have an available bed and are willing to accept the patient.  01/24/2013  Patient/family informed of [facility]' ownership interest in [facility], as well as of the fact that they are under no obligation to receive care at this facility.  PASARR submitted to EDS on 01/24/2013 PASARR number received from EDS on 01/24/2013  FL2 transmitted to all facilities in geographic area requested by pt/family on  01/24/2013 FL2 transmitted to all facilities within larger geographic area on   Patient informed that his/her managed care company has contracts with or will negotiate with  certain facilities, including the following:     Patient/family informed of bed offers received:  01/26/13 and 01/27/13 Patient chooses bed at [facility] Physician recommends and patient chooses bed at    Patient to be transferred to  on  [place] on 01/27/2013 Patient to be transferred to facility by [place]  The following physician request were entered in Epic:   Additional Comments:

## 2013-01-24 NOTE — Progress Notes (Signed)
Inpatient Diabetes Program Recommendations  AACE/ADA: New Consensus Statement on Inpatient Glycemic Control (2013)  Target Ranges:  Prepandial:   less than 140 mg/dL      Peak postprandial:   less than 180 mg/dL (1-2 hours)      Critically ill patients:  140 - 180 mg/dL     Results for AHNYAH, SABBATH (MRN [id number]) as of 01/24/2013 10:21  Ref. Range 01/24/2013 00:11 01/24/2013 07:21  Glucose-Capillary Latest Range: 70-99 mg/dL 348 (H) 281 (H)    Results for EZRIE, SINDELAR (MRN [id number]) as of 01/24/2013 10:21  Ref. Range 01/23/2013 19:45  Hemoglobin A1C Latest Range: <5.7 % 10.5 (H)    **Noted patient admitted with cellulitis.  A1c shows extremely poor CBG control at home.  Noted patient has had [place] who has been applying compression dressings for the last few weeks.  Also noted patient has not been checking CBGs at home b/c she ran out of strips and can't afford more strips.  Patient does have Medicare coverage but unsure if she has Part D for Rx coverage at this time.  Will consult with care management to determine what coverage patient has.    **Noted Dr. Jenne Campus note stating patient may require SNF at d/c  **Of note, patient is already taking 3 oral DM medications at home.  Is morbidly obese and A1c reflects poor glucose control at home.  Noted Levemir and Novolog insulins have been started this hospitalization.  Have ordered insulin instruction to be completed by the bedside RN asap in case patient is d/c'd home on insulin.     Will follow. Wyn Quaker RN, MSN, CDE Diabetes Coordinator Inpatient Diabetes Program [phone]

## 2013-01-24 NOTE — Consult Note (Signed)
WOC consult Note Reason for Consult:Lymphedema with cellulitis and open wounds on posterior LEs (bilateral) Wound type: venous insufficiency vs lymphedema vs infectious Pressure Ulcer POA: No Measurement:R>L Right LE with two distinct wounds side by side, but total area measures 18 cm x 12 cm x 0.2cm, tissue between ulcers is macerated.  Left LE with two distinct wounds proximal and distal.  Proximal measures 3cm x 6cm x 0.2cm and distal measures 18cm x 6.6cm x 0.2cm. Tissue between wounds is macerated. Wound [id number], pink, moist Drainage (amount, consistency, odor) moderated amount of serous exudate Periwound:macerated Dressing procedure/placement/frequency:I saw patient today with PT and wound care specialist, Maudry Diego and POC was developed collaboratively. We have recommended a low air loss bariatric bed to manage moisture and to accommodate the pain associated with side to side positioning in a patient with arthritic changes. Topical wound care orders as well as twice daily emollient application orders (Eucerin) to skin are provided. Patient will be assisted to position in the side lying position to redistribute pressure off of the wounded areas as possible.  Patient's discharge plan includes SNF placement. I will not follow, but will remain available to this patient, her nursing, medical and interdisciplinary team.  Please re-consult if needed. Thanks, Maudie Flakes, MSN, RN, [facility], [place] [phone])

## 2013-01-24 NOTE — Progress Notes (Signed)
  Nutrition Brief Note  Patient identified on the Malnutrition Screening Tool (MST) Report  Body mass index is 63.37 kg/(m^2). Patient meets criteria for Morbid Obesity based on current BMI.   Current diet order is Carb Modified, patient is consuming approximately 100% of meals at this time. Pt interested in receiving diabetes and wt loss diet education.  Lab Results  Component Value Date   HGBA1C 10.5* 01/23/2013    RD provided "Type 2 Diabetes Nutrition Therapy" handout from the Academy of Nutrition and Dietetics. Discussed different food groups and their effects on blood sugar, emphasizing carbohydrate-containing foods. Provided list of carbohydrates and recommended serving sizes of common foods.  Discussed importance of controlled and consistent carbohydrate intake throughout the day. Provided examples of ways to balance meals/snacks and encouraged intake of high-fiber, whole grain complex carbohydrates. Encouraged increased intake of vegetables and lean protein.  Provided pt with "Weight Loss Tips" handout and list of high fat foods for pt to limit/avoid from the Academy of Nutrition and Dietetics. Teach back method used.  Expect fair compliance.  Labs and medications reviewed. No further nutrition interventions warranted at this time. RD contact information provided. If additional nutrition issues arise, please re-consult RD.  Pryor Ochoa RD, LDN Inpatient Clinical Dietitian Pager: [phone] After Hours Pager: [phone]

## 2013-01-24 NOTE — Evaluation (Signed)
Physical Therapy Evaluation Patient Details Name: Makai Cappa MRN: [id number] DOB: [birth year] Today's Date: 01/24/2013 Time: [id number] PT Time Calculation (min): 31 min  PT Assessment / Plan / Recommendation History of Present Illness  Satrina Kieschnick is a 67 y.o. female with chronic leg swelling, morbid obesity and diabetes. She presents to the emergency room with worsening bilateral leg pain.  She has draining  wounds on both legs that have been treated with compression wraps by [facility]. Pt also with hx of advanced DDD of lumbar spine  Clinical Impression  Pt is limited by pain and debility from cellulitis, lumbar arthritis and chronic leg edema. She will benefit from multimodal therapies to increased strength and functional mobility with treatment to LE's that can progress once cellulitis has cleared. She will  likely will take prolonged treatment time and recommend continued PT at SNF    PT Assessment  Patient needs continued PT services    Follow Up Recommendations  SNF    Does the patient have the potential to tolerate intense rehabilitation      Barriers to Discharge   pt lives alone    Equipment Recommendations  Rolling walker with 5" wheels (bariatric)    Recommendations for Other Services OT consult   Frequency Min 3X/week    Precautions / Restrictions Precautions Precautions: Fall Restrictions Weight Bearing Restrictions: No   Pertinent Vitals/Pain Pt with increased burning pain in LLE with ROM      Mobility  Bed Mobility Bed Mobility: Rolling Right Rolling Right: 3: Mod assist Details for Bed Mobility Assistance: pt uses bed rail and is only able to roll partially. Transfers Transfers: Not assessed Ambulation/Gait Stairs: No Wheelchair Mobility Wheelchair Mobility: No    Exercises General Exercises - Lower Extremity Ankle Circles/Pumps: AROM;Both;Supine Quad Sets: AROM;5 reps;Both;Supine Gluteal Sets: AROM;Both;5 reps;Supine Other  Exercises Other Exercises: Pt instructed to do elbow extension exercise with both arms with orange theraband   PT Diagnosis: Generalized weakness;Acute pain  PT Problem List: Decreased strength;Pain;Decreased range of motion;Decreased activity tolerance;Decreased mobility PT Treatment Interventions: Functional mobility training;Therapeutic activities;Therapeutic exercise;Balance training;Patient/family education     PT Goals(Current goals can be found in the care plan section) Acute Rehab PT Goals Patient Stated Goal: I want to walk again PT Goal Formulation: With patient Time For Goal Achievement: 02/07/13 Potential to Achieve Goals: Fair  Visit Information  Last PT Received On: 01/24/13 History of Present Illness: Stephine Brizzolara is a 67 y.o. female with chronic leg swelling, morbid obesity and diabetes. She presents to the emergency room with worsening bilateral leg pain.  She has draining  wounds on both legs that have been treated with compression wraps by [facility] Pt also with hx of advanced DDD of lumbar spine       Prior Functioning  Home Living Family/patient expects to be discharged to:: Private residence Living Arrangements: Alone Available Help at Discharge: Family;Other (Comment) (very little help) Type of Home: Apartment Home Access: Ramped entrance Home Layout: One level Home Equipment: Wheelchair - power;Grab bars - toilet;Grab bars - tub/shower Additional Comments: haven't been able to get in the bed for about 3 months, so has been sleeping in a chair that would not hold her legs up Prior Function Level of Independence: Needs assistance Comments: pt has been  very limited due to leg pain Communication Communication: No difficulties Dominant Hand: Right    Cognition  Cognition Arousal/Alertness: Awake/alert Behavior During Therapy: WFL for tasks assessed/performed Overall Cognitive Status: Within Functional Limits for tasks assessed  Extremity/Trunk  Assessment Lower Extremity Assessment Lower Extremity Assessment: RLE deficits/detail;LLE deficits/detail RLE Deficits / Details: Pt with lymphedema with fibrotic changes in lower legs, trophic changes in feet and toes. She is able to quad set, but will not allow me to do any ROM testing due to fear of pain RLE: Unable to fully assess due to pain RLE Sensation: history of peripheral neuropathy LLE Deficits / Details: Pt with lymphedema with fibrotic changes in lower legs, trophic changes in feet and toes. She is able to quad set,  Attempted to do knee to chest exercise and pt had onset of buring pain leg, especially on lateral side LLE: Unable to fully assess due to pain LLE Sensation: history of peripheral neuropathy Cervical / Trunk Assessment Cervical / Trunk Assessment: Other exceptions Cervical / Trunk Exceptions: pt obese.  She appears to have increased lordosis seen with partial rolling   Balance Balance Balance Assessed: No  End of Session PT - End of Session Activity Tolerance: Patient limited by pain Patient left: in bed Nurse Communication: Need for lift equipment;Mobility status  GP    Terea K. Forsyth, [place] 01/24/2013, 10:25 AM

## 2013-01-24 NOTE — Progress Notes (Signed)
PATIENT DETAILS Name: Jodi Underwood Age: 67 y.o. Sex: female Date of Birth: [birth year] Admit Date: 01/23/2013 Admitting Physician Delfina Redwood, MD [id number], CAMMIE, MD  Subjective: Pain in bilateral lower extremities better  Assessment/Plan: Principal Problem:   Cellulitis of leg, bilateral -c/w Vanco/Zosyn day 2 -remains afebrile and leukocytosos -pain better with narcotics -check dopplers-although doubt DVT -underlying lymphedema-appreciate wound care  Active Problems: Lymphedema with cellulitis and open wounds on posterior LEs  -antibiotics as above -seen by wound care-appreciate recommendations -suspect may need SNF on discharge    DM type 2, uncontrolled  -stop Metformin while inpatient, increase Levemir to 20 units-suspect she will require Insulin on discharge-as already on 3 oral meds and A1C of 10.5 -c/w Tradjenta and Glucotrol for now  Neuropathy -c/w Neurontin -start Cymbalta -prn narcotics-but start Miralax for bowel regimen  HTN c/w Losartan    Morbid obesity -counseled regarding weight loss  B. Asthma -no evidence of exacerbation-lungs clear -prn Albuterol  Disposition: Remain inpatient  DVT Prophylaxis: Prophylactic Heparin  Code Status: Full code  Family Communication None at bedside  Procedures:  None  CONSULTS:  None   MEDICATIONS: Scheduled Meds: . atorvastatin  20 mg Oral q1800  . bd getting started take home kit  1 kit Other Once  . docusate sodium  100 mg Oral BID  . DULoxetine  30 mg Oral Daily  . gabapentin  300 mg Oral TID  . glipiZIDE  5 mg Oral BID AC  . heparin  5,000 Units Subcutaneous Q8H  . hydrocerin   Topical BID  . insulin aspart  0-20 Units Subcutaneous TID WC  . insulin aspart  0-5 Units Subcutaneous QHS  . insulin detemir  15 Units Subcutaneous QHS  . linagliptin  5 mg Oral Daily  . losartan  100 mg Oral Daily  . piperacillin-tazobactam (ZOSYN)  IV  3.375 g Intravenous Q8H  . [START ON  01/25/2013] pneumococcal 23 valent vaccine  0.5 mL Intramuscular Tomorrow-1000  . polyethylene glycol  17 g Oral BID  . vancomycin  1,500 mg Intravenous Q12H   Continuous Infusions:  PRN Meds:.acetaminophen, acetaminophen, albuterol, morphine injection, ondansetron (ZOFRAN) IV, ondansetron, oxyCODONE, senna-docusate, traZODone  Antibiotics: Anti-infectives   Start     Dose/Rate Route Frequency Ordered Stop   01/24/13 0600  piperacillin-tazobactam (ZOSYN) IVPB 3.375 g     3.375 g 12.5 mL/hr over 240 Minutes Intravenous 3 times per day 01/23/13 2316     01/24/13 0600  vancomycin (VANCOCIN) 1,500 mg in sodium chloride 0.9 % 500 mL IVPB     1,500 mg 250 mL/hr over 120 Minutes Intravenous Every 12 hours 01/24/13 0439     01/23/13 2145  vancomycin (VANCOCIN) IVPB 1000 mg/200 mL premix     1,000 mg 200 mL/hr over 60 Minutes Intravenous  Once 01/23/13 2139 01/24/13 0054   01/23/13 2145  piperacillin-tazobactam (ZOSYN) IVPB 3.375 g     3.375 g 100 mL/hr over 30 Minutes Intravenous  Once 01/23/13 2139 01/23/13 2250       PHYSICAL EXAM: Vital signs in last 24 hours: Filed Vitals:   01/23/13 1757 01/24/13 0018 01/24/13 0100 01/24/13 0700  BP: 158/70 138/89  115/54  Pulse: 116 117 99 111  Temp: 97.7 F (36.5 C) 98.9 F (37.2 C)  98.9 F (37.2 C)  TempSrc: Oral Oral  Oral  Resp: 20 20  20   Height:  5\' 6"  (1.676 m)    Weight:  177.991 kg (392 lb 6.4 oz)    SpO2: 100% 99%  99%    Weight change:  Filed Weights   01/24/13 0018  Weight: 177.991 kg (392 lb 6.4 oz)   Body mass index is 63.37 kg/(m^2).   Gen Exam: Awake and alert with clear speech.   Neck: Supple, No JVD.   Chest: B/L Clear.   CVS: S1 S2 Regular, no murmurs.  Abdomen: soft, BS +, non tender, non distended.  Extremities:lower extremities warm to touch.Both legs wrapped-did not open-see wound care note Neurologic: Non Focal.   Skin: No Rash.    Intake/Output from previous day:  Intake/Output Summary (Last 24  hours) at 01/24/13 1228 Last data filed at 01/24/13 0852  Gross per 24 hour  Intake   1490 ml  Output      0 ml  Net   1490 ml     LAB RESULTS: CBC  Recent Labs Lab 01/23/13 1945  WBC 7.8  HGB 11.9*  HCT 36.8  PLT 324  MCV 81.2  MCH 26.3  MCHC 32.3  RDW 13.4  LYMPHSABS 1.9  MONOABS 0.9  EOSABS 0.1  BASOSABS 0.0    Chemistries   Recent Labs Lab 01/23/13 1945  NA 131*  K 4.4  CL 95*  CO2 25  GLUCOSE 423*  BUN 35*  CREATININE 1.20*  CALCIUM 9.6    CBG:  Recent Labs Lab 01/24/13 0011 01/24/13 0721  GLUCAP 348* 281*    GFR Estimated Creatinine Clearance: 76.7 ml/min (by C-G formula based on Cr of 1.2).  Coagulation profile No results found for this basename: INR, PROTIME,  in the last 168 hours  Cardiac Enzymes No results found for this basename: CK, CKMB, TROPONINI, MYOGLOBIN,  in the last 168 hours  No components found with this basename: POCBNP,  No results found for this basename: DDIMER,  in the last 72 hours  Recent Labs  01/23/13 1945  HGBA1C 10.5*   No results found for this basename: CHOL, HDL, LDLCALC, TRIG, CHOLHDL, LDLDIRECT,  in the last 72 hours  Recent Labs  01/23/13 1945  TSH 3.408   No results found for this basename: VITAMINB12, FOLATE, FERRITIN, TIBC, IRON, RETICCTPCT,  in the last 72 hours No results found for this basename: LIPASE, AMYLASE,  in the last 72 hours  Urine Studies No results found for this basename: UACOL, UAPR, USPG, UPH, UTP, UGL, UKET, UBIL, UHGB, UNIT, UROB, ULEU, UEPI, UWBC, URBC, UBAC, CAST, CRYS, UCOM, BILUA,  in the last 72 hours  MICROBIOLOGY: No results found for this or any previous visit (from the past 240 hour(s)).  RADIOLOGY STUDIES/RESULTS: Dg Lumbar Spine Complete  01/16/2013   *RADIOLOGY REPORT*  Clinical Data: [place].  Right-sided pain.  LUMBAR SPINE - COMPLETE 4+ VIEW  Comparison: None.  Findings: No evidence of fracture or traumatic malalignment.  There is extensive chronic appearing  degenerative change in the lower lumbar spine with disc space narrowing and widespread facet arthropathy.  There are prominent anterior bridging osteophytes. Incidental note is made of a calcification in the right abdomen probably representing a gallstone.  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  No evidence of acute or traumatic finding.   Original Report Authenticated By: Nelson Chimes, M.D.   Dg Hip Complete Right  01/16/2013   *RADIOLOGY REPORT*  Clinical Data: Fall.  Right-sided pain.  RIGHT HIP - COMPLETE 2+ VIEW  Comparison: None.  Findings: No evidence of pelvic or hip fracture.  There is mild joint space narrowing and osteophyte formation.  Bones of the pelvis do  not show any acute or traumatic finding.  Mild degenerative change of the sacroiliac joints is noted.  IMPRESSION: No acute finding.  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   Original Report Authenticated By: Nelson Chimes, M.D.    Oren Binet, MD  [facility] Pager:[phone]  If 7PM-7AM, please contact night-coverage [email or web address] Password [email or web address] 01/24/2013, 12:28 PM   LOS: 1 day

## 2013-01-24 NOTE — Progress Notes (Signed)
Physical Therapy Treatment Patient Details Name: Maiysha Fehler MRN: [id number] DOB: [birth year] Today's Date: 01/24/2013 Time: [id number] PT Time Calculation (min): 31 min  PT Assessment / Plan / Recommendation  PT Comments   Pt has cellulitic wounds on the back of both lower legs. She will benefit from sidelying position to relieve pressure off wounds and back   Follow Up Recommendations  SNF     Does the patient have the potential to tolerate intense rehabilitation     Barriers to Discharge   pt lives alone    Equipment Recommendations  Rolling walker with 5" wheels (bariatric)    Recommendations for Other Services OT consult  Frequency Min 3X/week   Progress towards PT Goals Progress towards PT goals: Progressing toward goals  Plan Current plan remains appropriate    Precautions / Restrictions Precautions Precautions: Fall   Pertinent Vitals/Pain Pt c/o inceased pain in legs with any mobility    Mobility  Bed Mobility Bed Mobility: Rolling Right Rolling Right: 3: Mod assist Details for Bed Mobility Assistance: seen in conjunction with [place] nurse to evaluate wounds ( please see note from Maudie Flakes, [place] for measurements) and instruct in bed mobility and positioning.  Pt assisted to roll onto right side to relieve pressure on site of wounds on the posterior of both legs.  Pt needed 2 pillows between both legs for comfort Transfers Transfers: Not assessed Ambulation/Gait Stairs: No Wheelchair Mobility Wheelchair Mobility: No    Exercises    PT Diagnosis: Generalized weakness;Acute pain  PT Problem List: Decreased strength;Pain;Decreased range of motion;Decreased activity tolerance;Decreased mobility PT Treatment Interventions: Functional mobility training;Therapeutic activities;Therapeutic exercise;Balance training;Patient/family education   PT Goals (current goals can now be found in the care plan section) Acute Rehab PT Goals Patient Stated Goal: I want  to walk again PT Goal Formulation: With patient Time For Goal Achievement: 02/07/13 Potential to Achieve Goals: Fair  Visit Information  Last PT Received On: 01/24/13 Assistance Needed: +3 or more History of Present Illness: Airyana Hargus is a 67 y.o. female with chronic leg swelling, morbid obesity and diabetes. She presents to the emergency room with worsening bilateral leg pain.  She has draining  wounds on both legs that have been treated with compression wraps by [facility]. Pt also with hx of advanced DDD of lumbar spine    Subjective Data  Patient Stated Goal: I want to walk again   Cognition  Cognition Arousal/Alertness: Awake/alert Behavior During Therapy: WFL for tasks assessed/performed Overall Cognitive Status: Within Functional Limits for tasks assessed    Balance  Balance Balance Assessed: No  End of Session PT - End of Session Activity Tolerance: Patient limited by pain Patient left: in bed Nurse Communication: Need for lift equipment;Mobility status   GP     Norwood Levo 01/24/2013, 12:43 PM

## 2013-01-24 NOTE — Progress Notes (Signed)
ANTIBIOTIC CONSULT NOTE - INITIAL  Pharmacy Consult for Vancomycin Indication: Cellulitis  No Known Allergies  Patient Measurements: Height: 5\' 6"  (167.6 cm) Weight: 392 lb 6.4 oz (177.991 kg) IBW/kg (Calculated) : 59.3 Adjusted Body Weight:   Vital Signs: Temp: 98.9 F (37.2 C) (07/11 0018) Temp src: Oral (07/11 0018) BP: 138/89 mmHg (07/11 0018) Pulse Rate: 99 (07/11 0100) Intake/Output from previous day: 07/10 0701 - 07/11 0700 In: 360 [P.O.:360] Out: -  Intake/Output from this shift: Total I/O In: 360 [P.O.:360] Out: -   Labs:  Recent Labs  01/23/13 1945  WBC 7.8  HGB 11.9*  PLT 324  CREATININE 1.20*   Estimated Creatinine Clearance: 76.7 ml/min (by C-G formula based on Cr of 1.2). No results found for this basename: VANCOTROUGH, VANCOPEAK, VANCORANDOM, GENTTROUGH, GENTPEAK, GENTRANDOM, TOBRATROUGH, TOBRAPEAK, TOBRARND, AMIKACINPEAK, AMIKACINTROU, AMIKACIN,  in the last 72 hours   Microbiology: No results found for this or any previous visit (from the past 720 hour(s)).  Medical History: Past Medical History  Diagnosis Date  . Anemia   . Arthritis   . Asthma   . Diabetes mellitus   . Hypertension   . Urinary incontinence   . Pneumonia   . Degenerative disc disease   . Diabetic neuropathy   . Lymphedema     Medications:  Anti-infectives   Start     Dose/Rate Route Frequency Ordered Stop   01/24/13 0600  piperacillin-tazobactam (ZOSYN) IVPB 3.375 g     3.375 g 12.5 mL/hr over 240 Minutes Intravenous 3 times per day 01/23/13 2316     01/24/13 0600  vancomycin (VANCOCIN) 1,500 mg in sodium chloride 0.9 % 500 mL IVPB     1,500 mg 250 mL/hr over 120 Minutes Intravenous Every 12 hours 01/24/13 0439     01/23/13 2145  vancomycin (VANCOCIN) IVPB 1000 mg/200 mL premix     1,000 mg 200 mL/hr over 60 Minutes Intravenous  Once 01/23/13 2139 01/24/13 0054   01/23/13 2145  piperacillin-tazobactam (ZOSYN) IVPB 3.375 g     3.375 g 100 mL/hr over 30 Minutes  Intravenous  Once 01/23/13 2139 01/23/13 2250     Assessment: Patient with cellulitis.  First dose of antibiotics already given in ED.    Goal of Therapy:  Vancomycin trough level 10-15 mcg/ml  Plan:  Measure antibiotic drug levels at steady state Follow up culture results Vancomycin 1500mg  iv q12hr  Nani Skillern [place] 01/24/2013,4:43 AM

## 2013-01-24 NOTE — Progress Notes (Signed)
Physical Therapy Note: Called by RN to assist with pt mobility for dressing change and to move to air mattress overlay.   Assisted pt to sidelying on each side and supported pt while nursing did dressing to post legs.  Assisted pt to do sideying hip abduction x 5 reps with each leg.  Pt also able to do some partial knee to chest exercise in sidelying with each leg.  Pt rolled for application of maximove sling and was able to tolerate being moved to chair with maximove. Assisted nursing with application of sling and use of maximove. Pt tolerated sitting in the chair well and nursing working well with pt to position her legs in elevated position.  Recommend nursing continue to use maximove for transfers to chair and possibly bedside commode. Plan to continue to progress mobility with pt on air overlay mattress and from chair.  Donato Heinz. Brown,PT [id number] 14:30-14;45

## 2013-01-25 DIAGNOSIS — M7989 Other specified soft tissue disorders: Secondary | ICD-10-CM

## 2013-01-25 DIAGNOSIS — D649 Anemia, unspecified: Secondary | ICD-10-CM

## 2013-01-25 DIAGNOSIS — N179 Acute kidney failure, unspecified: Secondary | ICD-10-CM

## 2013-01-25 DIAGNOSIS — E860 Dehydration: Secondary | ICD-10-CM

## 2013-01-25 LAB — VANCOMYCIN, TROUGH: Vancomycin Tr: 20.1 ug/mL — ABNORMAL HIGH (ref 10.0–20.0)

## 2013-01-25 LAB — COMPREHENSIVE METABOLIC PANEL
ALT: 13 U/L (ref 0–35)
Calcium: 8.7 mg/dL (ref 8.4–10.5)
Creatinine, Ser: 1.22 mg/dL — ABNORMAL HIGH (ref 0.50–1.10)
GFR calc Af Amer: 52 mL/min — ABNORMAL LOW (ref >90)
Glucose, Bld: 254 mg/dL — ABNORMAL HIGH (ref 70–99)
Sodium: 135 mEq/L (ref 135–145)
Total Protein: 5.9 g/dL — ABNORMAL LOW (ref 6.0–8.3)

## 2013-01-25 LAB — CBC
Hemoglobin: 10.7 g/dL — ABNORMAL LOW (ref 12.0–15.0)
MCH: 27 pg (ref 26.0–34.0)
MCHC: 32.7 g/dL (ref 30.0–36.0)
MCV: 82.6 fL (ref 78.0–100.0)
Platelets: 260 10*3/uL (ref 150–400)

## 2013-01-25 LAB — GLUCOSE, CAPILLARY: Glucose-Capillary: 314 mg/dL — ABNORMAL HIGH (ref 70–99)

## 2013-01-25 LAB — URINE CULTURE

## 2013-01-25 MED ORDER — VANCOMYCIN HCL IN DEXTROSE 1-5 GM/200ML-% IV SOLN
1000.0000 mg | Freq: Two times a day (BID) | INTRAVENOUS | Status: DC
  Administered 2013-01-25 – 2013-01-27 (×4): 1000 mg via INTRAVENOUS
  Filled 2013-01-25 (×5): qty 200

## 2013-01-25 MED ORDER — HYDRALAZINE HCL 25 MG PO TABS
25.0000 mg | ORAL_TABLET | Freq: Three times a day (TID) | ORAL | Status: DC
  Administered 2013-01-25 – 2013-01-27 (×6): 25 mg via ORAL
  Filled 2013-01-25 (×11): qty 1

## 2013-01-25 NOTE — Progress Notes (Signed)
[facility] PROGRESS NOTE  Ilissa Bonnin [id number] DOB: 11/09/1945 DOA: 01/23/2013 PCP: Antony Blackbird, MD  Brief narrative: 67 y.o. female with multiple comorbidities including lower extremity lymphedema, hypertension, diabetes who presented to [facility] ED 01/23/2013 with worsening pain in bilateral lower extremities. Analgesia at home did not provide any symptomatic relief.  In ED, vitals emain stable but pain is not adequately controlled. Pt was started on vanco for possible cellulitis. Urinalysis was consistent with UTI.  Assessment/Plan:  Principal Problem:   Cellulitis of leg, bilateral, lymphedema - apprecaite wound care consult and their recommendations - per PT eval - SNF recommended - continue vanco for now for cellulitis - no DVT on bilateral LE doppler Active Problems:   UTI - continue zosyn - follow up urine culture results   DM type 2, uncontrolled, with neuropathy - A1c on this admission 10.5 indicating poor glycemic control - now on Levemir 20 units at bedtime, glipizide and januvia - will need to be discharged on insulin - continue sliding scale   Morbid obesity - nutrition consulted   ANEMIA-NOS - hemoglobin stable at 10.7 - no indications for transfusion   HYPERTENSION - D/C losartan due to acute renal failure - add hydralazine 25 mg TID   ASTHMA - stable   Acute renal failure - secondary to losartan - d/c losartan and use hydralazine for BP control  Code Status: full code Family Communication: no family at the bedside Disposition Plan: PT eval  [name], MD  [facility] Pager [phone]  If 7PM-7AM, please contact night-coverage [email or web address] Password [email or web address] 01/25/2013, 3:56 PM   LOS: 2 days   Consultants:  Wound care  Diabetic coordinator  Procedures:  none  Antibiotics:  vanco 01/23/2013 -->  Zosyn 01/23/2013 -->  HPI/Subjective: No acute overnight events.  Objective: Filed Vitals:   01/24/13 1500 01/24/13 2043 01/25/13 0416  01/25/13 1456  BP: 118/50 116/54 134/50 110/48  Pulse: 107 98 91 98  Temp: 98.6 F (37 C) 98 F (36.7 C) 98.3 F (36.8 C) 98.1 F (36.7 C)  TempSrc: Oral Oral Oral Oral  Resp: 18 16 18 18   Height:      Weight:      SpO2: 100% 100% 99% 99%    Intake/Output Summary (Last 24 hours) at 01/25/13 1556 Last data filed at 01/25/13 1100  Gross per 24 hour  Intake    480 ml  Output    950 ml  Net   -470 ml    Exam:   General:  Pt is alert, follows commands appropriately, not in acute distress, morbidly obese  Cardiovascular: Regular rate and rhythm, S1/S2, no murmurs, no rubs, no gallops  Respiratory: Clear to auscultation bilaterally, no wheezing, no crackles, no rhonchi  Abdomen: Soft, non tender, non distended, bowel sounds present, no guarding  Extremities: chronic skin changes, profound lymphedema, pulses DP and PT palpable bilaterally  Neuro: Grossly nonfocal  Data Reviewed: Basic Metabolic Panel:  Recent Labs Lab 01/23/13 1945 01/25/13 0440  NA 131* 135  K 4.4 4.1  CL 95* 102  CO2 25 27  GLUCOSE 423* 254*  BUN 35* 26*  CREATININE 1.20* 1.22*  CALCIUM 9.6 8.7   Liver Function Tests:  Recent Labs Lab 01/23/13 1945 01/25/13 0440  AST 20 17  ALT 17 13  ALKPHOS 86 67  BILITOT 0.4 0.3  PROT 7.7 5.9*  ALBUMIN 3.4* 2.5*   No results found for this basename: LIPASE, AMYLASE,  in the last 168 hours No results found for this basename:  AMMONIA,  in the last 168 hours CBC:  Recent Labs Lab 01/23/13 1945 01/25/13 0440  WBC 7.8 6.8  NEUTROABS 4.8  --   HGB 11.9* 10.7*  HCT 36.8 32.7*  MCV 81.2 82.6  PLT 324 260   Cardiac Enzymes: No results found for this basename: CKTOTAL, CKMB, CKMBINDEX, TROPONINI,  in the last 168 hours BNP: No components found with this basename: POCBNP,  CBG:  Recent Labs Lab 01/24/13 1228 01/24/13 1725 01/24/13 2130 01/25/13 0725 01/25/13 1201  GLUCAP 196* 186* 210* 222* 275*    Recent Results (from the past 240  hour(s))  URINE CULTURE     Status: None   Collection Time    01/23/13 11:13 PM      Result Value Range Status   Specimen Description URINE, RANDOM   Final   Special Requests NONE   Final   Culture  Setup Time 01/24/2013 05:05   Final   Colony Count 30,000 COLONIES/ML   Final   Culture     Final   Value: Multiple bacterial morphotypes present, none predominant. Suggest appropriate recollection if clinically indicated.   Report Status 01/25/2013 FINAL   Final     Studies: No results found.  Scheduled Meds: . docusate sodium  100 mg Oral BID  . DULoxetine  30 mg Oral Daily  . gabapentin  300 mg Oral TID  . glipiZIDE  5 mg Oral BID AC  . heparin  5,000 Units Subcutaneous Q8H  . hydrALAZINE  25 mg Oral Q8H  . insulin aspart  0-20 Units Subcutaneous TID WC  . insulin aspart  0-5 Units Subcutaneous QHS  . insulin detemir  20 Units Subcutaneous QHS  . linagliptin  5 mg Oral Daily  . piperacillin-tazobactam   3.375 g Intravenous Q8H  . polyethylene glycol  17 g Oral BID  . vancomycin  1,500 mg Intravenous Q12H

## 2013-01-25 NOTE — Progress Notes (Signed)
Physical Therapy Treatment Patient Details Name: Jodi Underwood MRN: [id number] DOB: 1945/09/17 Today's Date: 01/25/2013 Time: 1330-1400 PT Time Calculation (min): 30 min  PT Assessment / Plan / Recommendation  PT Comments   Improvement bed mobility and AROM of legs today.  Pt was able to come to edge of bed and step pivot to bedside commode  Follow Up Recommendations  SNF     Does the patient have the potential to tolerate intense rehabilitation   no  Barriers to Discharge        Equipment Recommendations       Recommendations for Other Services    Frequency     Progress towards PT Goals Progress towards PT goals: Progressing toward goals  Plan Current plan remains appropriate    Precautions / Restrictions Precautions Precautions: Fall   Pertinent Vitals/Pain Continues to have pain in legs on occasion.  Pt with anxiety also today when sitting on bedside commode for longer than she wanted to    Mobility  Bed Mobility Bed Mobility: Supine to Sit;Sit to Supine Supine to Sit: 1: +2 Total assist;With rails Supine to Sit: Patient Percentage: 40% Sit to Supine: 1: +2 Total assist;With rail Sit to Supine: Patient Percentage: 20% Details for Bed Mobility Assistance: pt uses flat spin technique to get to edge of bed.  She appears to be able to move much easier on air mattress and is better able to lift legs today Transfers Transfers: Sit to Stand;Stand to Sit Sit to Stand: 1: +2 Total assist;From bed;From chair/3-in-1 Sit to Stand: Patient Percentage: 50% Stand to Sit: 1: +2 Total assist;To bed;To chair/3-in-1 Stand to Sit: Patient Percentage: 50% Details for Transfer Assistance: pt needs cues to push up with arms, but is able to lift herself on to her feet from bed and bedside commode Ambulation/Gait Ambulation/Gait Assistance: 4: Min assist Ambulation Distance (Feet): 2 Feet Assistive device: Rolling walker (extra wide) Ambulation/Gait Assistance Details: pt pushed up  with arms to lift body up to assist with unweighting legs to step Gait Pattern: Step-to pattern;Decreased step length - right;Decreased step length - left Gait velocity: decreased General Gait Details: pt takes very small steps to step pivot to bedside commode and back to bed Stairs: No Wheelchair Mobility Wheelchair Mobility: No    Exercises General Exercises - Lower Extremity Ankle Circles/Pumps: AROM;Both;Supine Quad Sets: AROM;5 reps;Both;Supine Gluteal Sets: AROM;Both;5 reps;Supine Straight Leg Raises: AAROM;Both;5 reps   PT Diagnosis:    PT Problem List:   PT Treatment Interventions:     PT Goals (current goals can now be found in the care plan section)    Visit Information  Last PT Received On: 01/25/13 Assistance Needed: +3 or more    Subjective Data      Cognition  Cognition Arousal/Alertness: Awake/alert Behavior During Therapy: WFL for tasks assessed/performed Overall Cognitive Status: Within Functional Limits for tasks assessed    Balance  Balance Balance Assessed: Yes Static Sitting Balance Static Sitting - Balance Support: No upper extremity supported;Feet supported Static Sitting - Level of Assistance: 7: Independent Static Standing Balance Static Standing - Balance Support: Bilateral upper extremity supported;During functional activity Static Standing - Level of Assistance: 6: Modified independent (Device/Increase time)  End of Session PT - End of Session Activity Tolerance: Patient limited by pain Patient left: in bed Nurse Communication: Need for lift equipment;Mobility status   GP    Donato Heinz. Owens Shark, [place].  [phone] 01/25/2013, 3:08 PM

## 2013-01-25 NOTE — Progress Notes (Signed)
Gave preliminary offers (Pt only had 2 offers, at the time of this writing).  Pt hoping for [facility], however they said "No."  Pt asking if Weekday CSW can contact [facility] and ask if they'd be willing to reconsider.  Weekday CSW to follow.  Bernita Raisin, [place] Work [phone]

## 2013-01-25 NOTE — Progress Notes (Signed)
ANTIBIOTIC CONSULT NOTE - FOLLOW UP  Pharmacy Consult for Vancomycin Indication: cellulitis  No Known Allergies  Patient Measurements: Height: 5\' 6"  (167.6 cm) Weight: 392 lb 6.4 oz (177.991 kg) IBW/kg (Calculated) : 59.3  Vital Signs: Temp: 98.1 F (36.7 C) (07/12 1456) Temp src: Oral (07/12 1456) BP: 110/48 mmHg (07/12 1456) Pulse Rate: 98 (07/12 1456) Intake/Output from previous day: 07/11 0701 - 07/12 0700 In: 600 [P.O.:600] Out: 1300 [Urine:1300] Intake/Output from this shift: Total I/O In: 240 [P.O.:240] Out: 450 [Urine:450]  Labs:  Recent Labs  01/23/13 1945 01/25/13 0440  WBC 7.8 6.8  HGB 11.9* 10.7*  PLT 324 260  CREATININE 1.20* 1.22*   Estimated Creatinine Clearance: 75.4 ml/min (by C-G formula based on Cr of 1.22).  Recent Labs  01/25/13 1446  VANCOTROUGH 20.1*      Assessment: 75 yof morbidly obese, diabetic patient who has been dealing with lower leg wounds x 2 months presented 7/10 with worsening bilateral lower leg pain, brown discoloration and discharge from wound site. CBGs not controlled (in the 300-400s) and pt stopped checking CGS several weeks ago as she was unable to afford the strips.   Today is D#3 of Vancomycin and Zosyn.  Patient is afebrile, WBC wnl, Scr 1.22 for CG CrCl of 75 ml/min and normalized CrCl of 51 ml/min. BLE venous doppler neg for DVT but body habitus limited study result/interpretation. Hgb A1C 10.5, attempting to control CBGs but not there yet.   Vancomycin trough this evening 20.1 (goal is 10-15 for cellulitis).   Goal of Therapy:  Vancomycin trough level 10-15 mcg/ml  Plan:   Change Vancomycin to 1000 mg IV q12h  Pharmacy will f/u  Vanessa Crandall, PharmD, BCPS Pager: [phone] [place] #: [phone]

## 2013-01-25 NOTE — Progress Notes (Signed)
VASCULAR LAB PRELIMINARY  PRELIMINARY  PRELIMINARY  PRELIMINARY  Bilateral lower extremity venous Dopplers completed.    Preliminary report:  Technically difficult and limited study.  There is no obvious evidence of DVT or SVT in the bilateral lower extremities.  However, only the common femoral and popliteal veins were imaged secondary to body habitus and woody edema. Kirubel Aja, RVT 01/25/2013, 11:44 AM

## 2013-01-26 LAB — GLUCOSE, CAPILLARY
Glucose-Capillary: 214 mg/dL — ABNORMAL HIGH (ref 70–99)
Glucose-Capillary: 195 mg/dL — ABNORMAL HIGH (ref 70–99)
Glucose-Capillary: 237 mg/dL — ABNORMAL HIGH (ref 70–99)

## 2013-01-26 NOTE — ED Provider Notes (Signed)
Medical screening examination/treatment/procedure(s) were performed by non-physician practitioner and as supervising physician I was immediately available for consultation/collaboration.   Dot Lanes, MD 01/26/13 [phone]

## 2013-01-26 NOTE — Progress Notes (Signed)
[facility] PROGRESS NOTE  Jodi Underwood [id number] DOB: September 13, 1945 DOA: 01/23/2013 PCP: Antony Blackbird, MD  Brief narrative: 67 y.o. female with multiple comorbidities including lower extremity lymphedema, hypertension, diabetes who presented to [facility] ED 01/23/2013 with worsening pain in bilateral lower extremities. Analgesia at home did not provide any symptomatic relief.  In ED, vitals emain stable but pain is not adequately controlled. Pt was started on vanco for possible cellulitis. Urinalysis was consistent with UTI.   Assessment/Plan:   Principal Problem:  Cellulitis of bilateral LE, lymphedema  - apprecaite wound care consult and their recommendations  - per PT eval - SNF recommended, pt daughter to chose bed today, I have encouraged her to cisit the nursing homes offered and make her decision by am - continue vanco for now for cellulitis  - no DVT on bilateral LE doppler  Active Problems:  UTI - continue zosyn  - follow up urine culture results - multiple bacterial morphologies, not adequate for analysis DM type 2, uncontrolled, with neuropathy  - A1c on this admission 10.5 indicating poor glycemic control  - now on Levemir 20 units at bedtime, glipizide and januvia  - will need to be discharged on insulin; spoke with the patient expensively about multiple devastating side effects of uncontrolled diabetes including but not limited to kidney failure requiring hemodialysis, neuropathy which she already has and blindness and she was agreeable to being discharged with insulin - continue sliding scale  Morbid obesity  - nutrition consulted  ANEMIA-NOS  - hemoglobin stable at 10.7  - no indications for transfusion  HYPERTENSION  - D/Ced losartan due to acute renal failure  - continue hydralazine 25 mg TID  - BP now 123/57 ASTHMA  - stable  Acute renal failure  - secondary to losartan  - discontinued losartan and continue hydralazine for BP control   Code Status:  full code  Family Communication: no family at the bedside  Disposition Plan: PT eval - SNF placement; D?C in am  [name], MD  [facility]  Pager [phone]   Consultants:  Wound care  Diabetic coordinator Procedures:  none Antibiotics:  vanco 01/23/2013 -->  Zosyn 01/23/2013 -->   If 7PM-7AM, please contact night-coverage [email or web address] Password [facility] 01/26/2013, 3:40 PM   LOS: 3 days   HPI/Subjective: No acute overnight events.  Objective: Filed Vitals:   01/24/13 2043 01/25/13 0416 01/25/13 1456 01/26/13 0626  BP: 116/54 134/50 110/48 123/57  Pulse: 98 91 98 91  Temp: 98 F (36.7 C) 98.3 F (36.8 C) 98.1 F (36.7 C) 98.4 F (36.9 C)  TempSrc: Oral Oral Oral Oral  Resp: 16 18 18 18   Height:      Weight:      SpO2: 100% 99% 99% 98%    Intake/Output Summary (Last 24 hours) at 01/26/13 1540 Last data filed at 01/26/13 0803  Gross per 24 hour  Intake    600 ml  Output    950 ml  Net   -350 ml    Exam:   General:  Pt is alert, follows commands appropriately, not in acute distress  Cardiovascular: Regular rate and rhythm, S1/S2, no murmurs, no rubs, no gallops  Respiratory: Clear to auscultation bilaterally, no wheezing, no crackles, no rhonchi  Abdomen: Soft, non tender, obese, non distended, bowel sounds present, no guarding  Extremities: chronic skin changes LE, bilateral LE lymphedema with areas of cellulitis  Neuro: Grossly nonfocal  Data Reviewed: Basic Metabolic Panel:  Recent Labs Lab 01/23/13 1945 01/25/13 0440  NA 131* 135  K 4.4 4.1  CL 95* 102  CO2 25 27  GLUCOSE 423* 254*  BUN 35* 26*  CREATININE 1.20* 1.22*  CALCIUM 9.6 8.7   Liver Function Tests:  Recent Labs Lab 01/23/13 1945 01/25/13 0440  AST 20 17  ALT 17 13  ALKPHOS 86 67  BILITOT 0.4 0.3  PROT 7.7 5.9*  ALBUMIN 3.4* 2.5*   No results found for this basename: LIPASE, AMYLASE,  in the last 168 hours No results found for this basename: AMMONIA,  in the last 168  hours CBC:  Recent Labs Lab 01/23/13 1945 01/25/13 0440  WBC 7.8 6.8  NEUTROABS 4.8  --   HGB 11.9* 10.7*  HCT 36.8 32.7*  MCV 81.2 82.6  PLT 324 260   Cardiac Enzymes: No results found for this basename: CKTOTAL, CKMB, CKMBINDEX, TROPONINI,  in the last 168 hours BNP: No components found with this basename: POCBNP,  CBG:  Recent Labs Lab 01/25/13 1201 01/25/13 1706 01/25/13 2118 01/26/13 0725 01/26/13 1307  GLUCAP 275* 256* 314* 228* 214*    Recent Results (from the past 240 hour(s))  URINE CULTURE     Status: None   Collection Time    01/23/13 11:13 PM      Result Value Range Status   Specimen Description URINE, RANDOM   Final   Special Requests NONE   Final   Culture  Setup Time 01/24/2013 05:05   Final   Colony Count 30,000 COLONIES/ML   Final   Culture     Final   Value: Multiple bacterial morphotypes present, none predominant. Suggest appropriate recollection if clinically indicated.   Report Status 01/25/2013 FINAL   Final     Studies: No results found.  Scheduled Meds: . docusate sodium  100 mg Oral BID  . DULoxetine  30 mg Oral Daily  . gabapentin  300 mg Oral TID  . glipiZIDE  5 mg Oral BID AC  . heparin  5,000 Units Subcutaneous Q8H  . hydrALAZINE  25 mg Oral Q8H  . hydrocerin   Topical BID  . insulin aspart  0-20 Units Subcutaneous TID WC  . insulin aspart  0-5 Units Subcutaneous QHS  . insulin detemir  20 Units Subcutaneous QHS  . linagliptin  5 mg Oral Daily  . piperacillin-tazobactam (ZOSYN)  IV  3.375 g Intravenous Q8H  . polyethylene glycol  17 g Oral BID  . vancomycin  1,000 mg Intravenous Q12H   Continuous Infusions:

## 2013-01-27 MED ORDER — DULOXETINE HCL 30 MG PO CPEP: 30.0000 mg | ORAL_CAPSULE | Freq: Every day | ORAL | Status: DC

## 2013-01-27 MED ORDER — HYDROCERIN EX CREA: 1.0000 "application " | TOPICAL_CREAM | Freq: Two times a day (BID) | CUTANEOUS | Status: DC

## 2013-01-27 MED ORDER — ACETAMINOPHEN 325 MG PO TABS: 650.0000 mg | ORAL_TABLET | Freq: Four times a day (QID) | ORAL | Status: DC | PRN

## 2013-01-27 MED ORDER — OXYCODONE HCL 10 MG PO TABS: 10.0000 mg | ORAL_TABLET | ORAL | Status: DC | PRN

## 2013-01-27 MED ORDER — TRAZODONE HCL 50 MG PO TABS: 50.0000 mg | ORAL_TABLET | Freq: Every evening | ORAL | Status: DC | PRN

## 2013-01-27 MED ORDER — HYDRALAZINE HCL 25 MG PO TABS: 25.0000 mg | ORAL_TABLET | Freq: Three times a day (TID) | ORAL | Status: DC

## 2013-01-27 MED ORDER — DSS 100 MG PO CAPS: 100.0000 mg | ORAL_CAPSULE | Freq: Two times a day (BID) | ORAL | Status: DC

## 2013-01-27 MED ORDER — INSULIN ASPART 100 UNIT/ML ~~LOC~~ SOLN: 0.0000 [IU] | Freq: Three times a day (TID) | SUBCUTANEOUS | Status: DC

## 2013-01-27 MED ORDER — GLIPIZIDE 5 MG PO TABS: 5.0000 mg | ORAL_TABLET | Freq: Two times a day (BID) | ORAL | Status: DC

## 2013-01-27 MED ORDER — DOXYCYCLINE HYCLATE 50 MG PO CAPS: 50.0000 mg | ORAL_CAPSULE | Freq: Two times a day (BID) | ORAL | Status: DC

## 2013-01-27 MED ORDER — INSULIN DETEMIR 100 UNIT/ML ~~LOC~~ SOLN: 20.0000 [IU] | Freq: Every day | SUBCUTANEOUS | Status: DC

## 2013-01-27 MED ORDER — POLYETHYLENE GLYCOL 3350 17 G PO PACK: 17.0000 g | PACK | Freq: Two times a day (BID) | ORAL | Status: DC

## 2013-01-27 NOTE — Discharge Summary (Addendum)
Physician Discharge Summary  Jodi Underwood [id number] DOB: 1946/07/11 DOA: 01/23/2013  PCP: Antony Blackbird, MD  Admit date: 01/23/2013 Discharge date: 01/27/2013  Recommendations for Outpatient Follow-up:   1. please note that the wound care instructions are in wound care section of the discharge summary.  2. please encourage the patient after this it did in physical therapy daily.  3. the following changes we made to the diabetic regimen: Patient can continue taking glipizide as well as the Januvia but he needs to be on Levemir 20 units at bedtime as well as sliding scale insulin during the meals for better glycemic control.  4. please note that your blood pressure medication is hydralazine 25 mg 3 times a day. We have discontinued losartan 100 mg daily as he may have attributed to renal failure.  5. please continue to monitor renal function; check renal function in about 3 days after this discharge or admission to skilled nursing facility to make sure creatinine is stable. Creatinine at the time of discharge is 1.22 slightly outside of the normal range.  6. hemoglobin remained stable throughout the hospitalization, 10.7 with no evidence of bleeding and no indications for transfusion  7. I have emphasized the importance of regular bowel movements and the need to continue Colace, MiraLAX to regulate bowel movements.  8. continue taking doxycycline 100 mg twice daily for 9 more days on discharge for cellulitis.   Discharge wound care:  As directed   Comments:     Right LE with two distinct wounds side by side, but total area measures 18 cm x 12 cm x 0.2cm, tissue between ulcers is macerated.    Left LE with two distinct wounds proximal and distal.  Proximal measures 3cm x 6cm x 0.2cm and distal measures 18cm x 6.6cm x 0.2cm.   Tissue between wounds is macerated.  Wound [id number], pink, moist  Drainage (amount, consistency, odor) moderated amount of serous exudate   Dressing  procedure/placement/frequency:  - recommended a low air loss bariatric bed to manage moisture and to accommodate the pain associated with side to side positioning in a patient with arthritic changes. Topical wound care orders as well as twice daily emollient application orders (Eucerin) to skin are provided. Patient can be assisted to position in the side lying position to redistribute pressure off of the wounded areas as possible.     Discharge Diagnoses:  Principal Problem:   Cellulitis of leg, bilateral Active Problems:   DM type 2, uncontrolled, with neuropathy   Morbid obesity   ANEMIA-NOS   HYPERTENSION   ASTHMA   Lymphedema   Ulcers of both lower extremities   Acute renal failure   Discharge Condition: Medically stable for discharge to Long term acute care  Diet recommendation: As tolerated heart healthy and diabetic diet  History of present illness:  67 y.o. female with multiple comorbidities including lower extremity lymphedema, hypertension, diabetes who presented to [facility] ED 01/23/2013 with worsening pain in bilateral lower extremities. Analgesia at home did not provide any symptomatic relief.  In ED, vitals emain stable but pain is not adequately controlled. Pt was started on vanco for possible cellulitis. Urinalysis was consistent with UTI.   Assessment/Plan:   Principal Problem:  Cellulitis of bilateral LE, lymphedema  - apprecaite wound care consult and their recommendations. The details of care are described above in wound care section of discharge summary. - Discharge to long-term acute care today - Patient received 5 days of IV vancomycin for cellulitis. We will switch to  doxycycline for 9 more days on discharge - no DVT on bilateral LE doppler  Active Problems:  UTI - patient received 5 days of Zosyn. Antibiotics for UTI are not needed at the time of discharge. - follow up urine culture results - multiple bacterial morphologies, not adequate for analysis  DM type  2, uncontrolled, with neuropathy  - A1c on this admission 10.5 indicating poor glycemic control  - now on Levemir 20 units at bedtime, glipizide and januvia  - Sliding-scale insulin in long-term acute care Morbid obesity  - nutrition consulted  ANEMIA-NOS  - hemoglobin stable at 10.7  - no indications for transfusion  HYPERTENSION  - D/Ced losartan due to acute renal failure  - continue hydralazine 25 mg TID  ASTHMA  - stable  Acute renal failure  - secondary to losartan  - discontinued losartan and continued hydralazine for BP control   Code Status: full code  Family Communication:  family at the bedside   [name], MD  [facility]  Pager [phone]   Consultants:  Wound care  Diabetic coordinator Procedures:  none Antibiotics:  vanco 01/23/2013 --> 01/27/2013 Zosyn 01/23/2013 --> 01/27/2013   Discharge Exam: Filed Vitals:   01/27/13 0505  BP: 139/66  Pulse: 91  Temp: 98.2 F (36.8 C)  Resp: 18   Filed Vitals:   01/26/13 0626 01/26/13 1445 01/26/13 2021 01/27/13 0505  BP: 123/57 113/48 123/56 139/66  Pulse: 91 97 91 91  Temp: 98.4 F (36.9 C) 98.8 F (37.1 C) 98.2 F (36.8 C) 98.2 F (36.8 C)  TempSrc: Oral Oral Oral Oral  Resp: 18 18 16 18   Height:      Weight:      SpO2: 98% 99% 97% 99%    General: Pt is alert, follows commands appropriately, not in acute distress Cardiovascular: Regular rate and rhythm, S1/S2 +, no murmurs, no rubs, no gallops Respiratory: Clear to auscultation bilaterally, no wheezing, no crackles, no rhonchi Abdominal: Soft, non tender, non distended, bowel sounds +, no guarding Extremities: bilateral LE lymphedema, pulses palpable bilaterally DP and PT Neuro: Grossly nonfocal  Discharge Instructions  Discharge Orders   Future Orders Complete By Expires     Call MD for:  difficulty breathing, headache or visual disturbances  As directed     Call MD for:  persistant dizziness or light-headedness  As directed     Call MD for:   persistant nausea and vomiting  As directed     Call MD for:  severe uncontrolled pain  As directed     Diet - low sodium heart healthy  As directed     Discharge instructions  As directed     Comments:      1. please note the dad the wound care instructions are in wound care section of the discharge summary. 2. please encourage the patient after this it did in physical therapy daily. 3. the following changes we made to the diabetic regimen: Patient can continue taking glipizide as well as the Januvia but he needs to be on Levemir 20 units at bedtime as well as sliding scale insulin during the meals for better glycemic control. 4. please note that your blood pressure medication is hydralazine 25 mg 3 times a day. You can continue taking losartan 100 mg daily in addition to hydralazine. 5. please continue to monitor renal function; check renal function in about 3 days after this discharge or admission to skilled nursing facility to make sure creatinine is stable. Creatinine at  the time of discharge is 1.22 slightly outside of the normal range. 6. hemoglobin remained stable throughout the hospitalization, 10.7 with no evidence of bleeding and no indications for transfusion 7. I have emphasized the importance of regular bowel movements and the need to continue Colace, MiraLAX to regulate bowel movements. 8. continue taking doxycycline 100 mg twice daily for 9 more days on discharge for cellulitis.    Discharge wound care:  As directed     Comments:      Right LE with two distinct wounds side by side, but total area measures 18 cm x 12 cm x 0.2cm, tissue between ulcers is macerated.  Left LE with two distinct wounds proximal and distal.  Proximal measures 3cm x 6cm x 0.2cm and distal measures 18cm x 6.6cm x 0.2cm. Tissue between wounds is macerated. Wound [id number], pink, moist Drainage (amount, consistency, odor) moderated amount of serous exudate Periwound:macerated Dressing  procedure/placement/frequency: recommended a low air loss bariatric bed to manage moisture and to accommodate the pain associated with side to side positioning in a patient with arthritic changes. Topical wound care orders as well as twice daily emollient application orders (Eucerin) to skin are provided. Patient can be assisted to position in the side lying position to redistribute pressure off of the wounded areas as possible.    Increase activity slowly  As directed         Medication List    STOP taking these medications       atorvastatin 20 MG tablet  Commonly known as:  LIPITOR     cephALEXin 500 MG capsule  Commonly known as:  KEFLEX     glipiZIDE-metformin 5-500 MG per tablet  Commonly known as:  METAGLIP     HYDROcodone-acetaminophen 5-325 MG per tablet  Commonly known as:  NORCO/VICODIN      TAKE these medications       acetaminophen 325 MG tablet  Commonly known as:  TYLENOL  Take 2 tablets (650 mg total) by mouth every 6 (six) hours as needed.     albuterol 108 (90 BASE) MCG/ACT inhaler  Commonly known as:  PROVENTIL HFA;VENTOLIN HFA  Inhale 2 puffs into the lungs every 6 (six) hours as needed.     doxycycline 50 MG capsule  Commonly known as:  VIBRAMYCIN  Take 1 capsule (50 mg total) by mouth 2 (two) times daily.     DSS 100 MG Caps  Take 100 mg by mouth 2 (two) times daily.     DULoxetine 30 MG capsule  Commonly known as:  CYMBALTA  Take 1 capsule (30 mg total) by mouth daily.     gabapentin 100 MG capsule  Commonly known as:  NEURONTIN  Take 100-200 mg by mouth 2 (two) times daily.     glipiZIDE 5 MG tablet  Commonly known as:  GLUCOTROL  Take 1 tablet (5 mg total) by mouth 2 (two) times daily before a meal.     hydrALAZINE 25 MG tablet  Commonly known as:  APRESOLINE  Take 1 tablet (25 mg total) by mouth every 8 (eight) hours.     hydrocerin Crea  Apply 1 application topically 2 (two) times daily.     insulin aspart 100 UNIT/ML injection   Commonly known as:  novoLOG  Inject 0-20 Units into the skin 3 (three) times daily with meals.     insulin detemir 100 UNIT/ML injection  Commonly known as:  LEVEMIR  Inject 0.2 mLs (20 Units total) into the skin at  bedtime.     losartan 100 MG tablet  Commonly known as:  COZAAR  Take 100 mg by mouth daily.     naproxen sodium 220 MG tablet  Commonly known as:  ANAPROX  Take 220 mg by mouth 2 (two) times daily with a meal.     Oxycodone HCl 10 MG Tabs  Take 1 tablet (10 mg total) by mouth every 4 (four) hours as needed.     polyethylene glycol packet  Commonly known as:  MIRALAX / GLYCOLAX  Take 17 g by mouth 2 (two) times daily.     sitaGLIPtin 100 MG tablet  Commonly known as:  JANUVIA  Take 1 tablet (100 mg total) by mouth daily.     traZODone 50 MG tablet  Commonly known as:  DESYREL  Take 1 tablet (50 mg total) by mouth at bedtime as needed for sleep (insomnia).           Follow-up Information   Follow up with FULP, CAMMIE, MD In 1 week.   Contact information:   [place], [place] [phone]        The results of significant diagnostics from this hospitalization (including imaging, microbiology, ancillary and laboratory) are listed below for reference.    Significant Diagnostic Studies: Dg Lumbar Spine Complete  01/16/2013   *RADIOLOGY REPORT*  Clinical Data: [place].  Right-sided pain.  LUMBAR SPINE - COMPLETE 4+ VIEW  Comparison: None.  Findings: No evidence of fracture or traumatic malalignment.  There is extensive chronic appearing degenerative change in the lower lumbar spine with disc space narrowing and widespread facet arthropathy.  There are prominent anterior bridging osteophytes. Incidental note is made of a calcification in the right abdomen probably representing a gallstone.  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  No evidence of acute or traumatic finding.   Original Report  Authenticated By: Nelson Chimes, M.D.   Dg Hip Complete Right  01/16/2013   *RADIOLOGY REPORT*  Clinical Data: Fall.  Right-sided pain.  RIGHT HIP - COMPLETE 2+ VIEW  Comparison: None.  Findings: No evidence of pelvic or hip fracture.  There is mild joint space narrowing and osteophyte formation.  Bones of the pelvis do not show any acute or traumatic finding.  Mild degenerative change of the sacroiliac joints is noted.  IMPRESSION: No acute finding.  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   Original Report Authenticated By: Nelson Chimes, M.D.    Microbiology: Recent Results (from the past 240 hour(s))  URINE CULTURE     Status: None   Collection Time    01/23/13 11:13 PM      Result Value Range Status   Specimen Description URINE, RANDOM   Final   Special Requests NONE   Final   Culture  Setup Time 01/24/2013 05:05   Final   Colony Count 30,000 COLONIES/ML   Final   Culture     Final   Value: Multiple bacterial morphotypes present, none predominant. Suggest appropriate recollection if clinically indicated.   Report Status 01/25/2013 FINAL   Final     Labs: Basic Metabolic Panel:  Recent Labs Lab 01/23/13 1945 01/25/13 0440  NA 131* 135  K 4.4 4.1  CL 95* 102  CO2 25 27  GLUCOSE 423* 254*  BUN 35* 26*  CREATININE 1.20* 1.22*  CALCIUM 9.6 8.7   Liver Function Tests:  Recent Labs Lab 01/23/13 1945 01/25/13 0440  AST 20  17  ALT 17 13  ALKPHOS 86 67  BILITOT 0.4 0.3  PROT 7.7 5.9*  ALBUMIN 3.4* 2.5*   No results found for this basename: LIPASE, AMYLASE,  in the last 168 hours No results found for this basename: AMMONIA,  in the last 168 hours CBC:  Recent Labs Lab 01/23/13 1945 01/25/13 0440  WBC 7.8 6.8  NEUTROABS 4.8  --   HGB 11.9* 10.7*  HCT 36.8 32.7*  MCV 81.2 82.6  PLT 324 260   Cardiac Enzymes: No results found for this basename: CKTOTAL, CKMB, CKMBINDEX, TROPONINI,  in the last 168 hours BNP: BNP (last 3 results) No  results found for this basename: PROBNP,  in the last 8760 hours CBG:  Recent Labs Lab 01/26/13 0725 01/26/13 1307 01/26/13 1732 01/26/13 2125 01/27/13 0734  GLUCAP 228* 214* 195* 237* 209*    Time coordinating discharge: Over 30 minutes  Signed:  [name], MD  [place]  01/27/2013, 1:35 PM  Pager #: [phone]

## 2013-01-27 NOTE — Care Management Note (Signed)
Pt appropriate for transfer to [facility]. Pt's family toured facility. Pt and family agreeable to transfer. Admitting physician is Dr. Gwynneth Munson. Pt assigned to room 216. RN Jenny Reichmann notified of pt's disposition. COBRA form,EMS transfer report, & [facility] transport form signed by Dr.Devine and placed in [facility]. RN instructed to call [facility] to give report and call [facility] when patient ready fot transport.    Venita Lick Ryanna Teschner,RN,BSN [phone]

## 2013-01-27 NOTE — Progress Notes (Signed)
CSW received notification from [place] that [facility] was able to offer pt a bed.  Pt family toured [facility] and pt and pt family are agreeable to [facility] today.  CSW and RNCM assisted with facilitating pt discharge needs.   Pt to be transported to [facility] via [facility].  No further social work needs identified at this time.  CSW signing off.   Drake Leach, MSW, [place] [facility] [phone]

## 2013-01-27 NOTE — Progress Notes (Signed)
CSW met with pt at bedside this morning re: SNF placement.  Per MD, pt medically ready for discharge today.  Per pt, pt children are touring SNF facilities this morning and pt spoke to pt daughters via telephone and CSW also spoke with them and pt family plans to have decision today.  Pt children requested for pt to be reviewed by [facility] facilities for appropriateness for facilities. CSW notified RNCM re: pt family request.  CSW to follow up re: decision for SNF placement and facilitate pt discharge needs this afternoon.   Drake Leach, MSW, [place] [facility] [phone]

## 2013-01-27 NOTE — Care Management Note (Signed)
CSW informed CM would like LTACH to consider patient for admission. Cm contacted [facility] Nelia Shi at [phone] to inform facility of referral. Cm contacted [facility] liason Sonia Baller at [phone]. Awaiting decision from facilities if patient is LTACH appropriate.    Venita Lick Fern Canova,RN,BSN [phone]

## 2013-01-27 NOTE — Progress Notes (Signed)
Pt. Being transferred to [facility], report called. F[place] dc'd. Pt transferred via [facility]. Dressing instructions sent with pt.

## 2014-06-16 ENCOUNTER — Ambulatory Visit (HOSPITAL_COMMUNITY): Payer: Medicare Other | Attending: Family Medicine | Admitting: Physical Therapy

## 2014-07-03 ENCOUNTER — Ambulatory Visit (HOSPITAL_COMMUNITY): Payer: Medicare Other | Admitting: Physical Therapy

## 2014-07-20 ENCOUNTER — Ambulatory Visit: Payer: Medicare Other | Admitting: Interventional Cardiology

## 2014-07-21 ENCOUNTER — Ambulatory Visit (HOSPITAL_COMMUNITY)
Admission: RE | Admit: 2014-07-21 | Discharge: 2014-07-21 | Disposition: A | Discharge status: Home / Self Care | Payer: Medicare Other | Source: Ambulatory Visit | Attending: Family Medicine | Admitting: Family Medicine

## 2014-07-21 DIAGNOSIS — I890 Lymphedema, not elsewhere classified: Secondary | ICD-10-CM | POA: Insufficient documentation

## 2014-07-21 NOTE — Therapy (Signed)
[place], [place], [address] Phone: [phone]   Fax:  [phone]  Physical Therapy Evaluation  Patient Details  Name: Jodi Underwood MRN: [id number] Date of Birth: 04/26/1946  Encounter Date: 07/21/2014      PT End of Session - 07/21/14 1644    Visit Number 1   Number of Visits 24   Date for PT Re-Evaluation 09/19/14   Authorization Type medicare   Authorization - Visit Number 1   Authorization - Number of Visits 10   PT Start Time [id number]   PT Stop Time 1510   PT Time Calculation (min) 85 min   Activity Tolerance Patient tolerated treatment well      Past Medical History  Diagnosis Date  . Anemia   . Arthritis   . Asthma   . Diabetes mellitus   . Hypertension   . Urinary incontinence   . Pneumonia   . Degenerative disc disease   . Diabetic neuropathy   . Lymphedema     Past Surgical History  Procedure Laterality Date  . Abdominal hysterectomy      COMPLETE    There were no vitals taken for this visit.  Visit Diagnosis:  Lymphedema      Subjective Assessment - 07/21/14 1403    Symptoms [name] states that  had cellulitis once at [facility] in 2013 and had to go to [facility].  The patient then moved to [place].  She was diagnosed with lymphedema in 2015 and had been received complete decongestive services in [place].   She has  recently returned back to [place].    She states that her daughter here has tried to wrap her but the wraps tend to fall down.    The pt is in a motorized wheelchair and sleeps in a recliner.  She is unable to lie down flat.  Her Lt tends to swell more than the Rt.     Currently in Pain? (p) No/denies          [facility] PT Assessment - 07/21/14 1640    Assessment   Medical Diagnosis B lipolymphedma   Onset Date --  chronic   Prior Therapy --  not for several months secondary to moving into area    Precautions   Precautions Other (comment)   Precaution Comments skin precaution to prevent cellulitis.   Balance Screen   Has the patient fallen in the past 6 months No   Has the patient had a decrease in activity level because of a fear of falling?  Yes   Is the patient reluctant to leave their home because of a fear of falling?  No   Prior Function   Level of Independence Independent with homemaking with wheelchair   Vocation On disability   Cognition   Overall Cognitive Status Within Functional Limits for tasks assessed         Right Left  MTP 30.20 31.30  ankle 46.3 52.5  4cm 47.50 57.30  8cm 53.00 57.20  12 cm 52.80 58.00  16cm 52.80 53.50  20cm 58.00 58.50  24cm 62.70 62.70  28cm 63.70 61.20  32cm    36cm                                    Sum of squares 25049.64 27616.30  Total Volume 7973.551 [id number]  [facility] Adult PT Treatment/Exercise - July 30, 2014 0001    Manual Therapy   Manual Therapy Manual Lymphatic Drainage (MLD)   Manual Lymphatic Drainage (MLD) Pt recieved brief manual followed by cutting of foam and wrapping B LE to knee area using foam and multilayer short stretch bandaging                   PT Short Term Goals - 2014-07-30 1655    PT SHORT TERM GOAL #3   Title Pt to be completing a LE exercise program to improve lymph circulation    Time 4   Period Weeks           PT Long Term Goals - 30-Jul-2014 1656    PT LONG TERM GOAL #1   Title Pt to be properly caring for bandages   Time 8   Period Weeks   PT LONG TERM GOAL #2   Title Pt to have been fitted for compression garment    Time 8   Period Weeks   PT LONG TERM GOAL #3   Title Pt to verbalize the maintenace phase for lymphedema   Time 8   Period Weeks   PT LONG TERM GOAL #4   Title Pt volume to be decreased by 30%   Time 8   Period Weeks               Plan - July 30, 2014 1647    Clinical Impression Statement Pt is a 69 yo female with lipo-lymphedema who has had cellulites in the past.  The  pateint has not had services in several months secondary to a move and states that her legs are becoming much larger.  She will benefit from skilled therapy for complete decongestive therapy including modified manual as the pt can not lie down she sleeps in a recliner and comes to the department in a wheelchair; followed by multilayer short stretch bandaging.      Pt will benefit from skilled therapeutic intervention in order to improve on the following deficits Increased edema;Obesity   Rehab Potential Good   PT Frequency 3x / week   PT Duration 8 weeks   PT Treatment/Interventions ADLs/Self Care Home Management;Patient/family education;Manual techniques   PT Next Visit Plan PT to be given handouts on lymphedema as well as LE exercises. CDT   Consulted and Agree with Plan of Care Patient          G-Codes - Jul 30, 2014 1659    Functional Limitation Other PT primary   Other PT Primary Current Status [id number]) At least 60 percent but less than 80 percent impaired, limited or restricted   Other PT Primary Goal Status [id number]) At least 40 percent but less than 60 percent impaired, limited or restricted       Problem List Patient Active Problem List   Diagnosis Date Noted  . Acute renal failure 01/25/2013  . Cellulitis of leg, bilateral 01/23/2013  . Ulcers of both lower extremities 01/23/2013  . Lymphedema 06/13/2011  . DM type 2, uncontrolled, with neuropathy 06/23/2010  . Morbid obesity 06/23/2010  . ANEMIA-NOS 06/23/2010  . HYPERTENSION 06/23/2010  . ASTHMA 06/23/2010    Jodi Underwood,Jodi Underwood 07-30-2014, 5:00 PM  [place], [place], [address] Phone: [phone]   Fax:  [phone]

## 2014-07-23 ENCOUNTER — Encounter (HOSPITAL_COMMUNITY): Payer: Medicare Other | Admitting: Physical Therapy

## 2014-07-28 ENCOUNTER — Encounter: Payer: Self-pay | Admitting: Interventional Cardiology

## 2014-07-29 ENCOUNTER — Ambulatory Visit (HOSPITAL_COMMUNITY)
Admission: RE | Admit: 2014-07-29 | Discharge: 2014-07-29 | Disposition: A | Discharge status: Home / Self Care | Payer: Medicare Other | Source: Ambulatory Visit | Attending: Family Medicine | Admitting: Family Medicine

## 2014-07-29 DIAGNOSIS — I890 Lymphedema, not elsewhere classified: Secondary | ICD-10-CM | POA: Diagnosis not present

## 2014-07-29 NOTE — Therapy (Addendum)
[place], [place], [address] Phone: [phone]   Fax:  [phone]  Physical Therapy Treatment  Patient Details  Name: Jodi Underwood MRN: [id number] Date of Birth: 15-Jan-1946 Referring Provider:  Antony Blackbird, MD  Encounter Date: 07/29/2014      PT End of Session - 07/29/14 1259    Visit Number 2   Number of Visits 24   Date for PT Re-Evaluation 09/19/14   Authorization Type medicare   Authorization - Visit Number 2   Authorization - Number of Visits 10   PT Start Time 1100   PT Stop Time 1225   PT Time Calculation (min) 85 min      Past Medical History  Diagnosis Date  . Anemia   . Arthritis   . Asthma   . Diabetes mellitus   . Hypertension   . Urinary incontinence   . Pneumonia   . Degenerative disc disease   . Diabetic neuropathy   . Lymphedema     Past Surgical History  Procedure Laterality Date  . Abdominal hysterectomy      COMPLETE    There were no vitals taken for this visit.  Visit Diagnosis:  No diagnosis found.      Subjective Assessment - 07/29/14 1253    Symptoms Pt states that her bandages stayed up longer than they have in a long time.  Pt comes with new bandages that she has bought.    Currently in Pain? No/denies             [facility] Adult PT Treatment/Exercise - 07/29/14 0001    Modalities   Modalities --   Manual Therapy   Manual Therapy Manual Lymphatic Drainage (MLD);Compression Bandaging   Manual Lymphatic Drainage (MLD) Pt recieved manual therapy B including supraclavicular, deep and superfical abdominal, routing fluid using inguinal/axiallary anastomosis and anterior LE.  MLD was completed in sitting position.     Compression Bandaging multi short stretch bandages with foam wrapping from MTP to knee level B           PT Education - 07/29/14 1258    Education provided Yes   Education Details self massage, LE exercises and care for compression  bandages   Person(s) Educated Patient   Methods Explanation;Handout   Comprehension Verbalized understanding          PT Short Term Goals - 07/21/14 1655    PT SHORT TERM GOAL #3   Title Pt to be completing a LE exercise program to improve lymph circulation    Time 4   Period Weeks           PT Long Term Goals - 07/21/14 1656    PT LONG TERM GOAL #1   Title Pt to be properly caring for bandages   Time 8   Period Weeks   PT LONG TERM GOAL #2   Title Pt to have been fitted for compression garment    Time 8   Period Weeks   PT LONG TERM GOAL #3   Title Pt to verbalize the maintenace phase for lymphedema   Time 8   Period Weeks   PT LONG TERM GOAL #4   Title Pt volume to be decreased by 30%   Time 8   Period Weeks               Plan - 07/29/14 1300    Clinical Impression Statement Pt has decreased induration B with noted decreased  volume in Rt LE.  Lt LE remains congested.  Pt encouraged to bring daughter to department to learn massaging and bandaging techniques to assist when pt is not coming to therapy.    PT Next Visit Plan continue with CLT     [phone] CK [id number] CJ   Problem List Patient Active Problem List   Diagnosis Date Noted  . Acute renal failure 01/25/2013  . Cellulitis of leg, bilateral 01/23/2013  . Ulcers of both lower extremities 01/23/2013  . Lymphedema 06/13/2011  . DM type 2, uncontrolled, with neuropathy 06/23/2010  . Morbid obesity 06/23/2010  . ANEMIA-NOS 06/23/2010  . HYPERTENSION 06/23/2010  . ASTHMA 06/23/2010    Ramadan Couey,CINDY PT / Perley Jain 07/29/2014, 1:02 PM  [place] [address] [place], [place], [address] Phone: [phone]   Fax:  [phone]   PHYSICAL THERAPY DISCHARGE SUMMARY  Visits from Start of Care: 4  Current functional level related to goals / functional outcomes: No change Remaining deficits: same   Education / Equipment: On lymphedema  Plan: Patient  agrees to discharge.  Patient goals were not met. Patient is being discharged due to not returning since the last visit.  ?????      Rayetta Humphrey, [place] CLT [phone]

## 2014-07-31 ENCOUNTER — Ambulatory Visit (HOSPITAL_COMMUNITY): Payer: Medicare Other | Admitting: Physical Therapy

## 2014-08-03 NOTE — Addendum Note (Signed)
Encounter addended by: Leeroy Cha, PT on: 08/03/2014 11:40 AM<BR>     Documentation filed: Letters

## 2014-08-05 ENCOUNTER — Ambulatory Visit (HOSPITAL_COMMUNITY): Payer: Medicare Other | Admitting: Physical Therapy

## 2014-08-07 ENCOUNTER — Ambulatory Visit (HOSPITAL_COMMUNITY): Payer: Medicare Other | Admitting: Physical Therapy

## 2014-08-10 ENCOUNTER — Encounter (HOSPITAL_COMMUNITY): Payer: Medicare Other | Admitting: Physical Therapy

## 2014-08-12 ENCOUNTER — Encounter (HOSPITAL_COMMUNITY): Payer: Medicare Other | Admitting: Physical Therapy

## 2014-08-17 ENCOUNTER — Encounter (HOSPITAL_COMMUNITY): Payer: Medicare Other | Admitting: Physical Therapy

## 2014-08-18 ENCOUNTER — Encounter (HOSPITAL_COMMUNITY): Payer: Medicare Other | Admitting: Physical Therapy

## 2014-08-19 ENCOUNTER — Encounter (HOSPITAL_COMMUNITY): Payer: Medicare Other | Admitting: Physical Therapy

## 2014-08-20 ENCOUNTER — Ambulatory Visit (HOSPITAL_COMMUNITY)
Admission: RE | Admit: 2014-08-20 | Discharge: 2014-08-20 | Disposition: A | Discharge status: Home / Self Care | Payer: Medicare Other | Source: Ambulatory Visit | Attending: Family Medicine | Admitting: Family Medicine

## 2014-08-20 DIAGNOSIS — I890 Lymphedema, not elsewhere classified: Secondary | ICD-10-CM

## 2014-08-20 NOTE — Therapy (Signed)
[place], [place], [address] Phone: [phone]   Fax:  [phone]  Physical Therapy Treatment  Patient Details  Name: Jodi Underwood MRN: [id number] Date of Birth: 01/10/1946 Referring Provider:  Antony Blackbird, MD  Encounter Date: 08/20/2014      PT End of Session - 08/20/14 1942    Visit Number 3   Number of Visits 24   Date for PT Re-Evaluation 09/19/14   Authorization Type medicare   Authorization - Visit Number 3   Authorization - Number of Visits 10   PT Start Time 1620   PT Stop Time 1745   PT Time Calculation (min) 85 min   Activity Tolerance Patient tolerated treatment well   Behavior During Therapy [facility] for tasks assessed/performed      Past Medical History  Diagnosis Date  . Anemia   . Arthritis   . Asthma   . Diabetes mellitus   . Hypertension   . Urinary incontinence   . Pneumonia   . Degenerative disc disease   . Diabetic neuropathy   . Lymphedema     Past Surgical History  Procedure Laterality Date  . Abdominal hysterectomy      COMPLETE    There were no vitals taken for this visit.  Visit Diagnosis:  Lymphedema      Subjective Assessment - 08/20/14 1933    Symptoms Pt states she's not been here in 3 weeeks due to the weather and transportation.  Pt comes today with bandages and c/o of "rawness" Rt lateral ankle region.                    [facility] Adult PT Treatment/Exercise - 08/20/14 1934    Manual Therapy   Manual Therapy Manual Lymphatic Drainage (MLD);Compression Bandaging   Manual Lymphatic Drainage (MLD) Bandaging only to bilateral LE today preceded by cleansing and moisturizing due to time constraints   Compression Bandaging multi short stretch bandages with foam wrapping from MTP to knee level B                   PT Short Term Goals - 08/20/14 1940    PT SHORT TERM GOAL #1   Title Pt will verbalize understanding of the need for proper  skin care to reduce the risk of infection   Time 2   Period Weeks   Status On-going   PT SHORT TERM GOAL #2   Title Pt volume to be decreased by 20 %   Time 2   Period Weeks   Status On-going   PT SHORT TERM GOAL #3   Title Pt to be completing a LE exercise program to improve lymph circulation    Time 4   Status On-going           PT Long Term Goals - 08/20/14 1941    PT LONG TERM GOAL #1   Title Pt to be properly caring for bandages   Time 8   Period Weeks   Status On-going   PT LONG TERM GOAL #2   Title Pt to have been fitted for compression garment    Time 8   Period Weeks   Status On-going   PT LONG TERM GOAL #3   Title Pt to verbalize the maintenace phase for lymphedema   Time 8   Period Weeks   Status On-going   PT LONG TERM GOAL #4   Title Pt volume to be decreased by  30%   Time 8   Period Weeks   Status On-going               Plan - 08/20/14 1941    Clinical Impression Statement Pt request to only bandage today as she has an event to be at this afternoon. NOted redness lateral distal ankle, however no signs or symptoms of infection or broken skin. Cleansed bilateral LE's and moisturized prior to re-bandaging. Extra bandgaging to feet and distal LE's as well as cotton padding. Pt reported overall comfort with bandaging at end of session. Discussed various lymph products including the reidsleeve, pump, farrow and juxtafit wraps . Encouraged patient to bring CG for instruction in massage and to increase actvitiy around her home   PT Next Visit Plan continue with CLT and attempt transfer to supine for treatment. Re-measure next session.        Problem List Patient Active Problem List   Diagnosis Date Noted  . Acute renal failure 01/25/2013  . Cellulitis of leg, bilateral 01/23/2013  . Ulcers of both lower extremities 01/23/2013  . Lymphedema 06/13/2011  . DM type 2, uncontrolled, with neuropathy 06/23/2010  . Morbid obesity 06/23/2010  .  ANEMIA-NOS 06/23/2010  . HYPERTENSION 06/23/2010  . ASTHMA 06/23/2010    Teena Irani, PTA/CLT [phone] 08/20/2014, 7:43 PM  [place] [address] [place], [place], [address] Phone: [phone]   Fax:  [phone]

## 2014-08-21 ENCOUNTER — Encounter (HOSPITAL_COMMUNITY): Payer: Medicare Other | Admitting: Physical Therapy

## 2014-08-24 ENCOUNTER — Encounter (HOSPITAL_COMMUNITY): Payer: Medicare Other | Admitting: Physical Therapy

## 2014-08-25 ENCOUNTER — Ambulatory Visit (HOSPITAL_COMMUNITY)
Admission: RE | Admit: 2014-08-25 | Discharge: 2014-08-25 | Disposition: A | Discharge status: Home / Self Care | Payer: Medicare Other | Source: Ambulatory Visit | Attending: Family Medicine | Admitting: Family Medicine

## 2014-08-25 DIAGNOSIS — I890 Lymphedema, not elsewhere classified: Secondary | ICD-10-CM

## 2014-08-25 NOTE — Therapy (Signed)
[name], [place], [address] Phone: [phone]   Fax:  [phone]  Physical Therapy Treatment  Patient Details  Name: Jodi Underwood MRN: [id number] Date of Birth: 22-Sep-1945 Referring Provider:  Antony Blackbird, MD  Encounter Date: 08/25/2014      PT End of Session - 08/25/14 1645    Visit Number 4   Number of Visits 24   Date for PT Re-Evaluation 09/19/14   Authorization Type medicare   Authorization - Visit Number 4   Authorization - Number of Visits 10   PT Start Time [id number]   PT Stop Time 1520   PT Time Calculation (min) 95 min      Past Medical History  Diagnosis Date  . Anemia   . Arthritis   . Asthma   . Diabetes mellitus   . Hypertension   . Urinary incontinence   . Pneumonia   . Degenerative disc disease   . Diabetic neuropathy   . Lymphedema     Past Surgical History  Procedure Laterality Date  . Abdominal hysterectomy      COMPLETE    There were no vitals taken for this visit.  Visit Diagnosis:  No diagnosis found.      Subjective Assessment - 08/25/14 1640    Symptoms Pt comes to department with bandages off.  Dorsal aspect of B feet have increased ecchymosis with two small blister areas 1.0 cm  diameter (approximately) located on pt Rt dorsal foot.   Currently in Pain? No/denies          Date 07/21/2014 07/21/2014 08/25/2014 08/25/2014   Right Left Rt LT  MTP 30.20 31.30 27.2 30.7  ankle 46.3 52.5 43.70 56.00  4cm 47.50 57.30 45.80 54.00  8cm 53.00 57.20 53.00 52.00  12 cm 52.80 58.00 55.40 50.90  16cm 52.80 53.50 56.50 50.20  20cm 58.00 58.50 57.50 55.20  24cm 62.70 62.70 60.20 60.50  28cm 63.70 61.20 63.00 61.00  32cm      36cm                                                      Sum of squares 25049.64 27616.30 24716.87 25237.63  Total Volume 7973.551 8790.544 [id number] 8033.39                     [facility] Adult PT Treatment/Exercise - 08/25/14 1345     Manual Therapy   Manual Therapy Manual Lymphatic Drainage (MLD);Compression Bandaging   Manual Lymphatic Drainage (MLD) Pt recieved supraclavicular and routing fluid using inguinal/axillary anastomosis B    Compression Bandaging using foam and multilayer short stretch bandages                   PT Short Term Goals - 08/20/14 1940    PT SHORT TERM GOAL #1   Title Pt will verbalize understanding of the need for proper skin care to reduce the risk of infection   Time 2   Period Weeks   Status On-going   PT SHORT TERM GOAL #2   Title Pt volume to be decreased by 20 %   Time 2   Period Weeks   Status On-going   PT SHORT TERM GOAL #3   Title Pt to be completing a LE exercise program to improve  lymph circulation    Time 4   Status On-going           PT Long Term Goals - 08/20/14 1941    PT LONG TERM GOAL #1   Title Pt to be properly caring for bandages   Time 8   Period Weeks   Status On-going   PT LONG TERM GOAL #2   Title Pt to have been fitted for compression garment    Time 8   Period Weeks   Status On-going   PT LONG TERM GOAL #3   Title Pt to verbalize the maintenace phase for lymphedema   Time 8   Period Weeks   Status On-going   PT LONG TERM GOAL #4   Title Pt volume to be decreased by 30%   Time 8   Period Weeks   Status On-going               Plan - 08/25/14 1646    Clinical Impression Statement Pt has decreased Rt by 110 cc Lt by 830 cc. Therapist stressed the importance of coming for manual treatment on a regular basis to decongest in the most timely fassion.  LE were cleansed and moisturized with increasd ecchymosis noted on dorsal aspect of B LE with two small area on pt Rt dorsal foot .   Pt states that she is unable to afford any addutional items at this time.   PT Next Visit Plan continue with CLT measurements to be done on Tuesdays         Problem List Patient Active Problem List   Diagnosis Date Noted  . Acute renal  failure 01/25/2013  . Cellulitis of leg, bilateral 01/23/2013  . Ulcers of both lower extremities 01/23/2013  . Lymphedema 06/13/2011  . DM type 2, uncontrolled, with neuropathy 06/23/2010  . Morbid obesity 06/23/2010  . ANEMIA-NOS 06/23/2010  . HYPERTENSION 06/23/2010  . ASTHMA 06/23/2010    Jann Milkovich,CINDY PT/CLT 08/25/2014, 4:50 PM  [place] [address] [place], [place], [address] Phone: [phone]   Fax:  [phone]

## 2014-08-26 ENCOUNTER — Encounter (HOSPITAL_COMMUNITY): Payer: Medicare Other | Admitting: Physical Therapy

## 2014-08-28 ENCOUNTER — Encounter (HOSPITAL_COMMUNITY): Payer: Medicare Other | Admitting: Physical Therapy

## 2014-08-31 ENCOUNTER — Encounter (HOSPITAL_COMMUNITY): Payer: Medicare Other | Admitting: Physical Therapy

## 2014-09-01 ENCOUNTER — Ambulatory Visit (HOSPITAL_COMMUNITY): Payer: Medicare Other | Admitting: Physical Therapy

## 2014-09-02 ENCOUNTER — Encounter (HOSPITAL_COMMUNITY): Payer: Medicare Other | Admitting: Physical Therapy

## 2014-09-04 ENCOUNTER — Encounter (HOSPITAL_COMMUNITY): Payer: Medicare Other | Admitting: Physical Therapy

## 2014-09-07 ENCOUNTER — Encounter (HOSPITAL_COMMUNITY): Payer: Medicare Other | Admitting: Physical Therapy

## 2014-09-08 ENCOUNTER — Ambulatory Visit (HOSPITAL_COMMUNITY): Payer: Medicare Other | Admitting: Physical Therapy

## 2014-09-09 ENCOUNTER — Encounter (HOSPITAL_COMMUNITY): Payer: Medicare Other | Admitting: Physical Therapy

## 2014-09-11 ENCOUNTER — Encounter (HOSPITAL_COMMUNITY): Payer: Medicare Other | Admitting: Physical Therapy

## 2014-09-15 ENCOUNTER — Ambulatory Visit (HOSPITAL_COMMUNITY): Payer: Medicare Other | Admitting: Physical Therapy

## 2014-09-18 ENCOUNTER — Encounter (HOSPITAL_COMMUNITY): Payer: Medicare Other | Admitting: Physical Therapy

## 2014-09-25 ENCOUNTER — Inpatient Hospital Stay (HOSPITAL_COMMUNITY)
Admission: EM | Admit: 2014-09-25 | Discharge: 2014-10-04 | DRG: 872 | Disposition: A | Discharge status: Home / Self Care | Payer: Medicare Other | Attending: Internal Medicine | Admitting: Internal Medicine

## 2014-09-25 ENCOUNTER — Encounter (HOSPITAL_COMMUNITY): Payer: Self-pay | Admitting: Emergency Medicine

## 2014-09-25 ENCOUNTER — Emergency Department (HOSPITAL_COMMUNITY): Payer: Medicare Other

## 2014-09-25 DIAGNOSIS — E876 Hypokalemia: Secondary | ICD-10-CM | POA: Diagnosis not present

## 2014-09-25 DIAGNOSIS — G4730 Sleep apnea, unspecified: Secondary | ICD-10-CM | POA: Diagnosis present

## 2014-09-25 DIAGNOSIS — N3000 Acute cystitis without hematuria: Secondary | ICD-10-CM | POA: Diagnosis present

## 2014-09-25 DIAGNOSIS — I129 Hypertensive chronic kidney disease with stage 1 through stage 4 chronic kidney disease, or unspecified chronic kidney disease: Secondary | ICD-10-CM | POA: Diagnosis present

## 2014-09-25 DIAGNOSIS — K56609 Unspecified intestinal obstruction, unspecified as to partial versus complete obstruction: Secondary | ICD-10-CM

## 2014-09-25 DIAGNOSIS — Z90710 Acquired absence of both cervix and uterus: Secondary | ICD-10-CM

## 2014-09-25 DIAGNOSIS — B961 Klebsiella pneumoniae [K. pneumoniae] as the cause of diseases classified elsewhere: Secondary | ICD-10-CM | POA: Diagnosis present

## 2014-09-25 DIAGNOSIS — R509 Fever, unspecified: Secondary | ICD-10-CM | POA: Diagnosis not present

## 2014-09-25 DIAGNOSIS — R0603 Acute respiratory distress: Secondary | ICD-10-CM

## 2014-09-25 DIAGNOSIS — E1140 Type 2 diabetes mellitus with diabetic neuropathy, unspecified: Secondary | ICD-10-CM | POA: Diagnosis present

## 2014-09-25 DIAGNOSIS — I420 Dilated cardiomyopathy: Secondary | ICD-10-CM | POA: Diagnosis present

## 2014-09-25 DIAGNOSIS — K761 Chronic passive congestion of liver: Secondary | ICD-10-CM | POA: Diagnosis present

## 2014-09-25 DIAGNOSIS — N189 Chronic kidney disease, unspecified: Secondary | ICD-10-CM | POA: Diagnosis present

## 2014-09-25 DIAGNOSIS — J45901 Unspecified asthma with (acute) exacerbation: Secondary | ICD-10-CM | POA: Diagnosis present

## 2014-09-25 DIAGNOSIS — J80 Acute respiratory distress syndrome: Secondary | ICD-10-CM | POA: Diagnosis present

## 2014-09-25 DIAGNOSIS — A419 Sepsis, unspecified organism: Secondary | ICD-10-CM | POA: Diagnosis not present

## 2014-09-25 DIAGNOSIS — IMO0002 Reserved for concepts with insufficient information to code with codable children: Secondary | ICD-10-CM | POA: Diagnosis present

## 2014-09-25 DIAGNOSIS — I272 Other secondary pulmonary hypertension: Secondary | ICD-10-CM | POA: Diagnosis present

## 2014-09-25 DIAGNOSIS — N179 Acute kidney failure, unspecified: Secondary | ICD-10-CM | POA: Diagnosis present

## 2014-09-25 DIAGNOSIS — N390 Urinary tract infection, site not specified: Secondary | ICD-10-CM | POA: Diagnosis present

## 2014-09-25 DIAGNOSIS — I890 Lymphedema, not elsewhere classified: Secondary | ICD-10-CM | POA: Diagnosis present

## 2014-09-25 DIAGNOSIS — I10 Essential (primary) hypertension: Secondary | ICD-10-CM | POA: Diagnosis present

## 2014-09-25 DIAGNOSIS — Z6841 Body Mass Index (BMI) 40.0 and over, adult: Secondary | ICD-10-CM

## 2014-09-25 DIAGNOSIS — I5030 Unspecified diastolic (congestive) heart failure: Secondary | ICD-10-CM | POA: Diagnosis present

## 2014-09-25 DIAGNOSIS — I878 Other specified disorders of veins: Secondary | ICD-10-CM | POA: Diagnosis present

## 2014-09-25 DIAGNOSIS — M1990 Unspecified osteoarthritis, unspecified site: Secondary | ICD-10-CM | POA: Diagnosis present

## 2014-09-25 DIAGNOSIS — E1165 Type 2 diabetes mellitus with hyperglycemia: Secondary | ICD-10-CM | POA: Diagnosis present

## 2014-09-25 DIAGNOSIS — T502X5A Adverse effect of carbonic-anhydrase inhibitors, benzothiadiazides and other diuretics, initial encounter: Secondary | ICD-10-CM | POA: Diagnosis not present

## 2014-09-25 DIAGNOSIS — IMO0001 Reserved for inherently not codable concepts without codable children: Secondary | ICD-10-CM | POA: Diagnosis present

## 2014-09-25 DIAGNOSIS — N12 Tubulo-interstitial nephritis, not specified as acute or chronic: Secondary | ICD-10-CM | POA: Diagnosis present

## 2014-09-25 LAB — CBC WITH DIFFERENTIAL/PLATELET
BASOS ABS: 0 10*3/uL (ref 0.0–0.1)
BASOS PCT: 0 % (ref 0–1)
EOS PCT: 0 % (ref 0–5)
Eosinophils Absolute: 0 10*3/uL (ref 0.0–0.7)
HEMATOCRIT: 40 % (ref 36.0–46.0)
Hemoglobin: 12.4 g/dL (ref 12.0–15.0)
Lymphocytes Relative: 4 % — ABNORMAL LOW (ref 12–46)
Lymphs Abs: 0.5 10*3/uL — ABNORMAL LOW (ref 0.7–4.0)
MCH: 26.2 pg (ref 26.0–34.0)
MCHC: 31 g/dL (ref 30.0–36.0)
MCV: 84.6 fL (ref 78.0–100.0)
MONO ABS: 0.6 10*3/uL (ref 0.1–1.0)
Monocytes Relative: 4 % (ref 3–12)
Neutro Abs: 13.5 10*3/uL — ABNORMAL HIGH (ref 1.7–7.7)
Neutrophils Relative %: 92 % — ABNORMAL HIGH (ref 43–77)
Platelets: 297 10*3/uL (ref 150–400)
RBC: 4.73 MIL/uL (ref 3.87–5.11)
RDW: 13.1 % (ref 11.5–15.5)
WBC: 14.7 10*3/uL — ABNORMAL HIGH (ref 4.0–10.5)

## 2014-09-25 LAB — COMPREHENSIVE METABOLIC PANEL
ALK PHOS: 74 U/L (ref 39–117)
ALT: 15 U/L (ref 0–35)
ANION GAP: 12 (ref 5–15)
AST: 20 U/L (ref 0–37)
Albumin: 3.2 g/dL — ABNORMAL LOW (ref 3.5–5.2)
BUN: 12 mg/dL (ref 6–23)
CALCIUM: 9.1 mg/dL (ref 8.4–10.5)
CO2: 26 mmol/L (ref 19–32)
Chloride: 98 mmol/L (ref 96–112)
Creatinine, Ser: 1.63 mg/dL — ABNORMAL HIGH (ref 0.50–1.10)
GFR, EST AFRICAN AMERICAN: 36 mL/min — AB (ref >90)
GFR, EST NON AFRICAN AMERICAN: 31 mL/min — AB (ref >90)
GLUCOSE: 296 mg/dL — AB (ref 70–99)
Potassium: 3.5 mmol/L (ref 3.5–5.1)
SODIUM: 136 mmol/L (ref 135–145)
TOTAL PROTEIN: 6.5 g/dL (ref 6.0–8.3)
Total Bilirubin: 1.2 mg/dL (ref 0.3–1.2)

## 2014-09-25 LAB — URINALYSIS, ROUTINE W REFLEX MICROSCOPIC
Bilirubin Urine: NEGATIVE
GLUCOSE, UA: 500 mg/dL — AB
KETONES UR: 15 mg/dL — AB
Nitrite: NEGATIVE
PH: 5 (ref 5.0–8.0)
PROTEIN: 30 mg/dL — AB
Specific Gravity, Urine: 1.019 (ref 1.005–1.030)
Urobilinogen, UA: 0.2 mg/dL (ref 0.0–1.0)

## 2014-09-25 LAB — URINE MICROSCOPIC-ADD ON

## 2014-09-25 LAB — I-STAT CG4 LACTIC ACID, ED: Lactic Acid, Venous: 4.27 mmol/L (ref 0.5–2.0)

## 2014-09-25 MED ORDER — PIPERACILLIN-TAZOBACTAM 3.375 G IVPB
3.3750 g | Freq: Three times a day (TID) | INTRAVENOUS | Status: DC
  Administered 2014-09-26 – 2014-09-28 (×8): 3.375 g via INTRAVENOUS
  Filled 2014-09-25 (×10): qty 50

## 2014-09-25 MED ORDER — SODIUM CHLORIDE 0.9 % IV BOLUS (SEPSIS)
2000.0000 mL | Freq: Once | INTRAVENOUS | Status: AC
  Administered 2014-09-25: 2000 mL via INTRAVENOUS

## 2014-09-25 MED ORDER — ACETAMINOPHEN 500 MG PO TABS
1000.0000 mg | ORAL_TABLET | Freq: Once | ORAL | Status: AC
  Administered 2014-09-25: 1000 mg via ORAL
  Filled 2014-09-25: qty 2

## 2014-09-25 MED ORDER — PIPERACILLIN-TAZOBACTAM 3.375 G IVPB 30 MIN
3.3750 g | Freq: Once | INTRAVENOUS | Status: AC
  Administered 2014-09-25: 3.375 g via INTRAVENOUS
  Filled 2014-09-25: qty 50

## 2014-09-25 MED ORDER — SODIUM CHLORIDE 0.9 % IV SOLN
2500.0000 mg | Freq: Once | INTRAVENOUS | Status: AC
  Administered 2014-09-25: 2500 mg via INTRAVENOUS
  Filled 2014-09-25: qty 2500

## 2014-09-25 MED ORDER — VANCOMYCIN HCL IN DEXTROSE 1-5 GM/200ML-% IV SOLN: 1000.0000 mg | Freq: Once | INTRAVENOUS | Status: DC

## 2014-09-25 MED ORDER — SODIUM CHLORIDE 0.9 % IV BOLUS (SEPSIS)
1000.0000 mL | Freq: Once | INTRAVENOUS | Status: AC
Start: 2014-09-25 — End: 2014-09-25
  Administered 2014-09-25: 1000 mL via INTRAVENOUS

## 2014-09-25 NOTE — ED Provider Notes (Signed)
CSN: [id number]     Arrival date & time 09/25/14  1935 History   First MD Initiated Contact with Patient 09/25/14 2052     Chief Complaint  Patient presents with  . Shortness of Breath     (Consider location/radiation/quality/duration/timing/severity/associated sxs/prior Treatment) HPI Patient developed shortness of breath, generalized weakness and shaking chills this morning. Associated symptoms include nausea and slight cough. She presently denies shortness of breath and feels improved since treatment by EMS with intravenous is Zofran. No other associated symptoms. She admits to diarrhea earlier this week which has resolved. She's had no vomiting. No abdominal pain. No other associated symptoms Past Medical History  Diagnosis Date  . Anemia   . Arthritis   . Asthma   . Diabetes mellitus   . Hypertension   . Urinary incontinence   . Pneumonia   . Degenerative disc disease   . Diabetic neuropathy   . Lymphedema    Past Surgical History  Procedure Laterality Date  . Abdominal hysterectomy      COMPLETE   Family History  Problem Relation Age of Onset  . Adopted: Yes   History  Substance Use Topics  . Smoking status: Never Smoker   . Smokeless tobacco: Never Used  . Alcohol Use: Yes     Comment: occassionally   OB History    No data available     Review of Systems  Constitutional: Positive for chills.  Respiratory: Positive for shortness of breath.   Cardiovascular: Positive for leg swelling.  Gastrointestinal: Positive for nausea and diarrhea.  Neurological: Positive for weakness.      Allergies  Review of patient's allergies indicates no known allergies.  Home Medications   Prior to Admission medications   Medication Sig Start Date End Date Taking? Authorizing Provider  acetaminophen (TYLENOL) 325 MG tablet Take 2 tablets (650 mg total) by mouth every 6 (six) hours as needed. 01/27/13   Robbie Lis, MD  albuterol (PROVENTIL HFA;VENTOLIN HFA) 108 (90  BASE) MCG/ACT inhaler Inhale 2 puffs into the lungs every 6 (six) hours as needed. 07/24/11   [name], MD  docusate sodium 100 MG CAPS Take 100 mg by mouth 2 (two) times daily. 01/27/13   Robbie Lis, MD  doxycycline (VIBRAMYCIN) 50 MG capsule Take 1 capsule (50 mg total) by mouth 2 (two) times daily. 01/27/13   Robbie Lis, MD  DULoxetine (CYMBALTA) 30 MG capsule Take 1 capsule (30 mg total) by mouth daily. Patient not taking: Reported on 07/21/2014 01/27/13   Robbie Lis, MD  gabapentin (NEURONTIN) 100 MG capsule Take 100-200 mg by mouth 2 (two) times daily.    Historical Provider, MD  glipiZIDE (GLUCOTROL) 5 MG tablet Take 1 tablet (5 mg total) by mouth 2 (two) times daily before a meal. 01/27/13   Robbie Lis, MD  hydrALAZINE (APRESOLINE) 25 MG tablet Take 1 tablet (25 mg total) by mouth every 8 (eight) hours. 01/27/13   Robbie Lis, MD  hydrocerin (EUCERIN) CREA Apply 1 application topically 2 (two) times daily. 01/27/13   Robbie Lis, MD  insulin aspart (NOVOLOG) 100 UNIT/ML injection Inject 0-20 Units into the skin 3 (three) times daily with meals. 01/27/13   Robbie Lis, MD  insulin detemir (LEVEMIR) 100 UNIT/ML injection Inject 0.2 mLs (20 Units total) into the skin at bedtime. 01/27/13   Robbie Lis, MD  naproxen sodium (ANAPROX) 220 MG tablet Take 220 mg by mouth 2 (two) times daily with  a meal.    Historical Provider, MD  oxyCODONE 10 MG TABS Take 1 tablet (10 mg total) by mouth every 4 (four) hours as needed. Patient not taking: Reported on 07/21/2014 01/27/13   Robbie Lis, MD  polyethylene glycol [facility] / Floria Raveling) packet Take 17 g by mouth 2 (two) times daily. 01/27/13   Robbie Lis, MD  sitaGLIPtin (JANUVIA) 100 MG tablet Take 1 tablet (100 mg total) by mouth daily. 10/06/11   [name], MD  traZODone (DESYREL) 50 MG tablet Take 1 tablet (50 mg total) by mouth at bedtime as needed for sleep (insomnia). 01/27/13   Robbie Lis, MD   BP 129/55 mmHg  Pulse  111  Temp(Src) 102.3 F (39.1 C) (Rectal)  Resp 22  Ht 5\' 6"  (1.676 m)  Wt 380 lb (172.367 kg)  BMI 61.36 kg/m2  SpO2 95% Physical Exam  Constitutional: She appears well-developed and well-nourished. No distress.  HENT:  Head: Normocephalic and atraumatic.  Eyes: Conjunctivae are normal. Pupils are equal, round, and reactive to light.  Neck: Neck supple. No tracheal deviation present. No thyromegaly present.  Cardiovascular: Normal rate and regular rhythm.   No murmur heard. Pulmonary/Chest: Effort normal and breath sounds normal.  Abdominal: Soft. Bowel sounds are normal. She exhibits no distension. There is no tenderness.  Obese  Musculoskeletal: Normal range of motion. She exhibits edema. She exhibits no tenderness.  As of lymphedema bilateral lower extremities  Neurological: She is alert. Coordination normal.  Skin: Skin is warm and dry. No rash noted.  Psychiatric: She has a normal mood and affect.  Nursing note and vitals reviewed.   ED Course  Procedures (including critical care time) Labs Review Labs Reviewed  CULTURE, BLOOD (ROUTINE X 2)  CULTURE, BLOOD (ROUTINE X 2)  URINE CULTURE  CBC WITH DIFFERENTIAL/PLATELET  COMPREHENSIVE METABOLIC PANEL  URINALYSIS, ROUTINE W REFLEX MICROSCOPIC  I-STAT CG4 LACTIC ACID, ED    Imaging Review No results found.   EKG Interpretation None     Patient feels much improved after treatment with intravenous fluids and intravenous antibiotics and Tylenol Results for orders placed or performed during the hospital encounter of 09/25/14  CBC WITH DIFFERENTIAL  Result Value Ref Range   WBC 14.7 (H) 4.0 - 10.5 K/uL   RBC 4.73 3.87 - 5.11 MIL/uL   Hemoglobin 12.4 12.0 - 15.0 g/dL   HCT 40.0 36.0 - 46.0 %   MCV 84.6 78.0 - 100.0 fL   MCH 26.2 26.0 - 34.0 pg   MCHC 31.0 30.0 - 36.0 g/dL   RDW 13.1 11.5 - 15.5 %   Platelets 297 150 - 400 K/uL   Neutrophils Relative % 92 (H) 43 - 77 %   Neutro Abs 13.5 (H) 1.7 - 7.7 K/uL    Lymphocytes Relative 4 (L) 12 - 46 %   Lymphs Abs 0.5 (L) 0.7 - 4.0 K/uL   Monocytes Relative 4 3 - 12 %   Monocytes Absolute 0.6 0.1 - 1.0 K/uL   Eosinophils Relative 0 0 - 5 %   Eosinophils Absolute 0.0 0.0 - 0.7 K/uL   Basophils Relative 0 0 - 1 %   Basophils Absolute 0.0 0.0 - 0.1 K/uL  Comprehensive metabolic panel  Result Value Ref Range   Sodium 136 135 - 145 mmol/L   Potassium 3.5 3.5 - 5.1 mmol/L   Chloride 98 96 - 112 mmol/L   CO2 26 19 - 32 mmol/L   Glucose, Bld 296 (H) 70 -  99 mg/dL   BUN 12 6 - 23 mg/dL   Creatinine, Ser 1.63 (H) 0.50 - 1.10 mg/dL   Calcium 9.1 8.4 - 10.5 mg/dL   Total Protein 6.5 6.0 - 8.3 g/dL   Albumin 3.2 (L) 3.5 - 5.2 g/dL   AST 20 0 - 37 U/L   ALT 15 0 - 35 U/L   Alkaline Phosphatase 74 39 - 117 U/L   Total Bilirubin 1.2 0.3 - 1.2 mg/dL   GFR calc non Af Amer 31 (L) >90 mL/min   GFR calc Af Amer 36 (L) >90 mL/min   Anion gap 12 5 - 15  Urinalysis, Routine w reflex microscopic  Result Value Ref Range   Color, Urine YELLOW YELLOW   APPearance TURBID (A) CLEAR   Specific Gravity, Urine 1.019 1.005 - 1.030   pH 5.0 5.0 - 8.0   Glucose, UA 500 (A) NEGATIVE mg/dL   Hgb urine dipstick MODERATE (A) NEGATIVE   Bilirubin Urine NEGATIVE NEGATIVE   Ketones, ur 15 (A) NEGATIVE mg/dL   Protein, ur 30 (A) NEGATIVE mg/dL   Urobilinogen, UA 0.2 0.0 - 1.0 mg/dL   Nitrite NEGATIVE NEGATIVE   Leukocytes, UA LARGE (A) NEGATIVE  Urine microscopic-add on  Result Value Ref Range   Squamous Epithelial / LPF RARE RARE   WBC, UA TOO NUMEROUS TO COUNT <3 WBC/hpf   RBC / HPF 21-50 <3 RBC/hpf   Bacteria, UA MANY (A) RARE   Urine-Other MUCOUS PRESENT   I-Stat CG4 Lactic Acid, ED (not at [facility])  Result Value Ref Range   Lactic Acid, Venous 4.27 (HH) 0.5 - 2.0 mmol/L   Comment NOTIFIED PHYSICIAN   I-Stat CG4 Lactic Acid, ED (not at [facility])  Result Value Ref Range   Lactic Acid, Venous 1.65 0.5 - 2.0 mmol/L   Dg Chest Port 1 View  09/25/2014   CLINICAL DATA:   69 year old female with nausea, vomiting and fever since yesterday.  EXAM: PORTABLE CHEST - 1 VIEW  COMPARISON:  No priors.  FINDINGS: Study is limited by patient rotation. With these limitations in mind, there is no definite acute consolidative airspace disease. No pleural effusions. No evidence of pulmonary edema. Mild cardiomegaly. Upper mediastinal contours are distorted by patient positioning.  IMPRESSION: 1. No radiographic evidence of acute cardiopulmonary disease. 2. Mild cardiomegaly.   Electronically Signed   By: Vinnie Langton M.D.   On: 09/25/2014 22:28    MDM  Spoke with Dr. Arnoldo Morale plan admit medical surgical floor intravenous antibiotics Final diagnoses:  None   diagnosis #1 sepsis  #2 urinary tract infection #3 hyperglycemia #4 renal insufficiency     Orlie Dakin, MD 09/26/14 [id number]

## 2014-09-25 NOTE — Progress Notes (Signed)
ANTIBIOTIC CONSULT NOTE - INITIAL  Pharmacy Consult for Vancomycin and Zosyn Indication: rule out sepsis  No Known Allergies  Patient Measurements: Height: 5\' 6"  (167.6 cm) Weight: (!) 380 lb (172.367 kg) IBW/kg (Calculated) : 59.3  Vital Signs: Temp: 102.3 F (39.1 C) (03/11 2111) Temp Source: Rectal (03/11 2111) BP: 139/55 mmHg (03/11 2030) Pulse Rate: 105 (03/11 2030) Intake/Output from previous day:   Intake/Output from this shift:    Labs: No results for input(s): WBC, HGB, PLT, LABCREA, CREATININE in the last 72 hours. CrCl cannot be calculated (Unknown ideal weight.). No results for input(s): VANCOTROUGH, VANCOPEAK, VANCORANDOM, GENTTROUGH, GENTPEAK, GENTRANDOM, TOBRATROUGH, TOBRAPEAK, TOBRARND, AMIKACINPEAK, AMIKACINTROU, AMIKACIN in the last 72 hours.   Microbiology: No results found for this or any previous visit (from the past 720 hour(s)).  Medical History: Past Medical History  Diagnosis Date  . Anemia   . Arthritis   . Asthma   . Diabetes mellitus   . Hypertension   . Urinary incontinence   . Pneumonia   . Degenerative disc disease   . Diabetic neuropathy   . Lymphedema     Medications:   (Not in a hospital admission) Scheduled:   Infusions:  . piperacillin-tazobactam    . vancomycin     Assessment: 69yo female presents with SOB. Pharmacy is consulted to dose vancomycin and zosyn for rule out sepsis. Pt is febrile to 102.3 and all labs are pending.  Pt received vancomycin 2.5g and zosyn 3.375g IV one time in the ED.  Goal of Therapy:  Vancomycin trough level 15-20 mcg/ml  Plan:  Vancomycin 2.5g IV load Zosyn 3.375g IV q8h Measure antibiotic drug levels at steady state Follow up culture results, renal function, and clinical course Redose vancomycin based on labs  Andrey Cota. Diona Foley, PharmD Clinical Pharmacist Pager [phone] 09/25/2014,9:21 PM

## 2014-09-25 NOTE — ED Notes (Signed)
Per pt and EMS, pt has been having SOB all day but more severe this evening. Pt sats 94 on RA at this time, denying pain.

## 2014-09-25 NOTE — ED Notes (Signed)
Phlebotomy aware of need to draw cultures prior to antibiotics.

## 2014-09-26 ENCOUNTER — Inpatient Hospital Stay (HOSPITAL_COMMUNITY): Payer: Medicare Other

## 2014-09-26 DIAGNOSIS — R7881 Bacteremia: Secondary | ICD-10-CM | POA: Diagnosis not present

## 2014-09-26 DIAGNOSIS — N179 Acute kidney failure, unspecified: Secondary | ICD-10-CM

## 2014-09-26 DIAGNOSIS — E876 Hypokalemia: Secondary | ICD-10-CM | POA: Diagnosis not present

## 2014-09-26 DIAGNOSIS — N189 Chronic kidney disease, unspecified: Secondary | ICD-10-CM | POA: Diagnosis present

## 2014-09-26 DIAGNOSIS — K761 Chronic passive congestion of liver: Secondary | ICD-10-CM | POA: Diagnosis present

## 2014-09-26 DIAGNOSIS — J4521 Mild intermittent asthma with (acute) exacerbation: Secondary | ICD-10-CM | POA: Diagnosis not present

## 2014-09-26 DIAGNOSIS — I420 Dilated cardiomyopathy: Secondary | ICD-10-CM | POA: Diagnosis present

## 2014-09-26 DIAGNOSIS — I10 Essential (primary) hypertension: Secondary | ICD-10-CM | POA: Diagnosis present

## 2014-09-26 DIAGNOSIS — I272 Other secondary pulmonary hypertension: Secondary | ICD-10-CM | POA: Diagnosis present

## 2014-09-26 DIAGNOSIS — T502X5A Adverse effect of carbonic-anhydrase inhibitors, benzothiadiazides and other diuretics, initial encounter: Secondary | ICD-10-CM | POA: Diagnosis not present

## 2014-09-26 DIAGNOSIS — J45901 Unspecified asthma with (acute) exacerbation: Secondary | ICD-10-CM | POA: Diagnosis present

## 2014-09-26 DIAGNOSIS — A419 Sepsis, unspecified organism: Principal | ICD-10-CM

## 2014-09-26 DIAGNOSIS — N390 Urinary tract infection, site not specified: Secondary | ICD-10-CM | POA: Diagnosis not present

## 2014-09-26 DIAGNOSIS — Z6841 Body Mass Index (BMI) 40.0 and over, adult: Secondary | ICD-10-CM | POA: Diagnosis not present

## 2014-09-26 DIAGNOSIS — B961 Klebsiella pneumoniae [K. pneumoniae] as the cause of diseases classified elsewhere: Secondary | ICD-10-CM | POA: Diagnosis present

## 2014-09-26 DIAGNOSIS — I129 Hypertensive chronic kidney disease with stage 1 through stage 4 chronic kidney disease, or unspecified chronic kidney disease: Secondary | ICD-10-CM | POA: Diagnosis present

## 2014-09-26 DIAGNOSIS — M1990 Unspecified osteoarthritis, unspecified site: Secondary | ICD-10-CM | POA: Diagnosis present

## 2014-09-26 DIAGNOSIS — I890 Lymphedema, not elsewhere classified: Secondary | ICD-10-CM

## 2014-09-26 DIAGNOSIS — I5030 Unspecified diastolic (congestive) heart failure: Secondary | ICD-10-CM | POA: Diagnosis present

## 2014-09-26 DIAGNOSIS — E1165 Type 2 diabetes mellitus with hyperglycemia: Secondary | ICD-10-CM | POA: Diagnosis present

## 2014-09-26 DIAGNOSIS — IMO0001 Reserved for inherently not codable concepts without codable children: Secondary | ICD-10-CM | POA: Diagnosis present

## 2014-09-26 DIAGNOSIS — Z90710 Acquired absence of both cervix and uterus: Secondary | ICD-10-CM | POA: Diagnosis not present

## 2014-09-26 DIAGNOSIS — J80 Acute respiratory distress syndrome: Secondary | ICD-10-CM | POA: Diagnosis present

## 2014-09-26 DIAGNOSIS — E1140 Type 2 diabetes mellitus with diabetic neuropathy, unspecified: Secondary | ICD-10-CM

## 2014-09-26 DIAGNOSIS — N12 Tubulo-interstitial nephritis, not specified as acute or chronic: Secondary | ICD-10-CM | POA: Diagnosis present

## 2014-09-26 DIAGNOSIS — I878 Other specified disorders of veins: Secondary | ICD-10-CM | POA: Diagnosis present

## 2014-09-26 DIAGNOSIS — N3000 Acute cystitis without hematuria: Secondary | ICD-10-CM | POA: Diagnosis present

## 2014-09-26 DIAGNOSIS — IMO0002 Reserved for concepts with insufficient information to code with codable children: Secondary | ICD-10-CM | POA: Diagnosis present

## 2014-09-26 DIAGNOSIS — G4730 Sleep apnea, unspecified: Secondary | ICD-10-CM | POA: Diagnosis present

## 2014-09-26 DIAGNOSIS — R509 Fever, unspecified: Secondary | ICD-10-CM | POA: Diagnosis present

## 2014-09-26 LAB — BASIC METABOLIC PANEL
Anion gap: 9 (ref 5–15)
BUN: 14 mg/dL (ref 6–23)
CHLORIDE: 107 mmol/L (ref 96–112)
CO2: 24 mmol/L (ref 19–32)
CREATININE: 1.74 mg/dL — AB (ref 0.50–1.10)
Calcium: 8 mg/dL — ABNORMAL LOW (ref 8.4–10.5)
GFR calc Af Amer: 33 mL/min — ABNORMAL LOW (ref >90)
GFR, EST NON AFRICAN AMERICAN: 29 mL/min — AB (ref >90)
Glucose, Bld: 275 mg/dL — ABNORMAL HIGH (ref 70–99)
POTASSIUM: 3.6 mmol/L (ref 3.5–5.1)
Sodium: 140 mmol/L (ref 135–145)

## 2014-09-26 LAB — MRSA PCR SCREENING: MRSA by PCR: NEGATIVE

## 2014-09-26 LAB — CBC
HCT: 34.4 % — ABNORMAL LOW (ref 36.0–46.0)
Hemoglobin: 10.7 g/dL — ABNORMAL LOW (ref 12.0–15.0)
MCH: 26.2 pg (ref 26.0–34.0)
MCHC: 31.1 g/dL (ref 30.0–36.0)
MCV: 84.1 fL (ref 78.0–100.0)
Platelets: 270 10*3/uL (ref 150–400)
RBC: 4.09 MIL/uL (ref 3.87–5.11)
RDW: 13.4 % (ref 11.5–15.5)
WBC: 16.5 10*3/uL — ABNORMAL HIGH (ref 4.0–10.5)

## 2014-09-26 LAB — LIPID PANEL
CHOLESTEROL: 146 mg/dL (ref 0–200)
HDL: 33 mg/dL — ABNORMAL LOW (ref >39)
LDL CALC: 84 mg/dL (ref 0–99)
TRIGLYCERIDES: 147 mg/dL (ref <150)
Total CHOL/HDL Ratio: 4.4 RATIO
VLDL: 29 mg/dL (ref 0–40)

## 2014-09-26 LAB — GLUCOSE, CAPILLARY
GLUCOSE-CAPILLARY: 277 mg/dL — AB (ref 70–99)
Glucose-Capillary: 267 mg/dL — ABNORMAL HIGH (ref 70–99)
Glucose-Capillary: 195 mg/dL — ABNORMAL HIGH (ref 70–99)
Glucose-Capillary: 236 mg/dL — ABNORMAL HIGH (ref 70–99)

## 2014-09-26 LAB — I-STAT CG4 LACTIC ACID, ED: Lactic Acid, Venous: 1.65 mmol/L (ref 0.5–2.0)

## 2014-09-26 LAB — CBG MONITORING, ED: Glucose-Capillary: 263 mg/dL — ABNORMAL HIGH (ref 70–99)

## 2014-09-26 MED ORDER — DULOXETINE HCL 30 MG PO CPEP
30.0000 mg | ORAL_CAPSULE | Freq: Every day | ORAL | Status: DC
  Filled 2014-09-26: qty 1

## 2014-09-26 MED ORDER — HYDROCERIN EX CREA
1.0000 "application " | TOPICAL_CREAM | Freq: Two times a day (BID) | CUTANEOUS | Status: DC
  Administered 2014-09-26 – 2014-10-03 (×13): 1 via TOPICAL
  Filled 2014-09-26 (×2): qty 113

## 2014-09-26 MED ORDER — OXYCODONE HCL 5 MG PO TABS: 5.0000 mg | ORAL_TABLET | ORAL | Status: DC | PRN

## 2014-09-26 MED ORDER — INSULIN ASPART 100 UNIT/ML ~~LOC~~ SOLN: 0.0000 [IU] | Freq: Every day | SUBCUTANEOUS | Status: DC

## 2014-09-26 MED ORDER — HYDROMORPHONE HCL 1 MG/ML IJ SOLN: 0.5000 mg | INTRAMUSCULAR | Status: DC | PRN

## 2014-09-26 MED ORDER — INSULIN ASPART 100 UNIT/ML ~~LOC~~ SOLN
0.0000 [IU] | SUBCUTANEOUS | Status: DC
  Administered 2014-09-26: 4 [IU] via SUBCUTANEOUS

## 2014-09-26 MED ORDER — ALBUTEROL SULFATE (2.5 MG/3ML) 0.083% IN NEBU: 3.0000 mL | INHALATION_SOLUTION | Freq: Four times a day (QID) | RESPIRATORY_TRACT | Status: DC | PRN

## 2014-09-26 MED ORDER — ONDANSETRON HCL 4 MG/2ML IJ SOLN
4.0000 mg | Freq: Four times a day (QID) | INTRAMUSCULAR | Status: DC | PRN
  Administered 2014-09-26 – 2014-10-01 (×2): 4 mg via INTRAVENOUS
  Filled 2014-09-26 (×2): qty 2

## 2014-09-26 MED ORDER — ACETAMINOPHEN 325 MG PO TABS
650.0000 mg | ORAL_TABLET | Freq: Four times a day (QID) | ORAL | Status: DC | PRN
  Administered 2014-09-26 – 2014-10-03 (×4): 650 mg via ORAL
  Filled 2014-09-26 (×4): qty 2

## 2014-09-26 MED ORDER — IRBESARTAN 75 MG PO TABS
75.0000 mg | ORAL_TABLET | Freq: Every day | ORAL | Status: DC
  Administered 2014-09-26: 75 mg via ORAL
  Filled 2014-09-26: qty 1

## 2014-09-26 MED ORDER — INSULIN ASPART 100 UNIT/ML ~~LOC~~ SOLN
0.0000 [IU] | Freq: Three times a day (TID) | SUBCUTANEOUS | Status: DC
  Administered 2014-09-27: 7 [IU] via SUBCUTANEOUS
  Administered 2014-09-27 (×2): 4 [IU] via SUBCUTANEOUS
  Administered 2014-09-28: 15 [IU] via SUBCUTANEOUS
  Administered 2014-09-28 (×2): 20 [IU] via SUBCUTANEOUS
  Administered 2014-09-29: 4 [IU] via SUBCUTANEOUS
  Administered 2014-09-29: 15 [IU] via SUBCUTANEOUS
  Administered 2014-09-29: 11 [IU] via SUBCUTANEOUS
  Administered 2014-09-30: 3 [IU] via SUBCUTANEOUS

## 2014-09-26 MED ORDER — VANCOMYCIN HCL 10 G IV SOLR
1750.0000 mg | INTRAVENOUS | Status: DC
  Administered 2014-09-26 – 2014-09-28 (×3): 1750 mg via INTRAVENOUS
  Filled 2014-09-26 (×5): qty 1750

## 2014-09-26 MED ORDER — ONDANSETRON HCL 4 MG PO TABS: 4.0000 mg | ORAL_TABLET | Freq: Four times a day (QID) | ORAL | Status: DC | PRN

## 2014-09-26 MED ORDER — INSULIN DETEMIR 100 UNIT/ML ~~LOC~~ SOLN
10.0000 [IU] | Freq: Every day | SUBCUTANEOUS | Status: DC
  Administered 2014-09-26: 10 [IU] via SUBCUTANEOUS
  Filled 2014-09-26 (×2): qty 0.1

## 2014-09-26 MED ORDER — INSULIN ASPART 100 UNIT/ML ~~LOC~~ SOLN
0.0000 [IU] | Freq: Three times a day (TID) | SUBCUTANEOUS | Status: DC
  Administered 2014-09-26 (×2): 8 [IU] via SUBCUTANEOUS

## 2014-09-26 MED ORDER — TRAZODONE HCL 50 MG PO TABS
50.0000 mg | ORAL_TABLET | Freq: Every evening | ORAL | Status: DC | PRN
  Filled 2014-09-26: qty 1

## 2014-09-26 MED ORDER — ENOXAPARIN SODIUM 80 MG/0.8ML ~~LOC~~ SOLN
80.0000 mg | SUBCUTANEOUS | Status: DC
  Administered 2014-09-26 – 2014-09-29 (×4): 80 mg via SUBCUTANEOUS
  Filled 2014-09-26 (×4): qty 0.8

## 2014-09-26 MED ORDER — ALUM & MAG HYDROXIDE-SIMETH 200-200-20 MG/5ML PO SUSP: 30.0000 mL | Freq: Four times a day (QID) | ORAL | Status: DC | PRN

## 2014-09-26 MED ORDER — GABAPENTIN 100 MG PO CAPS
100.0000 mg | ORAL_CAPSULE | Freq: Two times a day (BID) | ORAL | Status: DC
  Filled 2014-09-26 (×2): qty 2

## 2014-09-26 MED ORDER — SODIUM CHLORIDE 0.9 % IV SOLN
INTRAVENOUS | Status: AC
Start: 2014-09-26 — End: 2014-09-26
  Administered 2014-09-26: 04:00:00 via INTRAVENOUS

## 2014-09-26 MED ORDER — ACETAMINOPHEN 650 MG RE SUPP: 650.0000 mg | Freq: Four times a day (QID) | RECTAL | Status: DC | PRN

## 2014-09-26 MED ORDER — SODIUM CHLORIDE 0.9 % IV SOLN: INTRAVENOUS | Status: DC

## 2014-09-26 NOTE — Progress Notes (Signed)
[facility] TEAM 1 - Stepdown/ICU TEAM Progress Note  Jyrah Munley [id number] DOB: [birth year] DOA: 09/25/2014 PCP: Antony Blackbird, MD  Admit HPI / Brief Narrative: Jodi Underwood is a 69 y.o.BF PMHx  Type II DM, Essential HTN, Asthma, Morbid Obesity, and Chronic Lymphedema of the BLEs who presents to the ED with complaints of fevers and chills and lower ABD pain and palpitations that started today. She was found to have a fever to 102.5 in the ED, and an initial lactate level of 4.27. A Sepsis work up was initiated and she was placed on IV Vancomycin and Zosyn. A UA was positive.   HPI/Subjective: 3/12  A/O 4, states feeling better than at admission but continued fatigue, continued abdominal pain but decreased in severity. Positive N/negative V  Assessment/Plan: Sepsis due to unspecified organism -Normal saline 148ml/hr -ContinueIV Vancomycin and Zosyn -Blood culture pending -Acute abdominal series pending SBO vs ileus vs perforation (unlikely)  UTI (urinary tract infection) -Covered by IV Zosyn -Urine culture pending  DM type 2, uncontrolled, with neuropathy -Hemoglobin A1c pending -Lipid panel pending -Continue Levemir 10 units daily -Increase to resistant SSI  Acute on chronic renal failure -Discontinue Ibesartan  Essential hypertension -Hold Hydralazine, -See sepsis  CHF? -Strict in and out; since admission + 791ml -Daily weight; admission weight= 172 kg -Echocardiogram pending  Lymphedema -Consult to WOC/PT/OT for lymphedema wraps  Asthma Albuterol Nebs PRN  Morbid obesity    Code Status: FULL Family Communication: no family present at time of exam Disposition Plan: Resolution sepsis    Consultants: NA  Procedure/Significant Events: 3/12 Acute abdominal series pending   Culture 3/12 urine pending 3/12 blood pending   Antibiotics: Zosyn 3/12>> Vancomycin 3/11>>  DVT  prophylaxis: Lovenox   Devices NA   LINES / TUBES:  NA    Continuous Infusions: . sodium chloride 75 mL/hr at 09/26/14 0349    Objective: VITAL SIGNS: Temp: 98.6 F (37 C) (03/12 1129) Temp Source: Oral (03/12 1129) BP: 106/51 mmHg (03/12 1129) Pulse Rate: 96 (03/12 1129) SPO2; FIO2:   Intake/Output Summary (Last 24 hours) at 09/26/14 1315 Last data filed at 09/26/14 1200  Gross per 24 hour  Intake    800 ml  Output      0 ml  Net    800 ml     Exam: General: A/O 4, NAD, No acute respiratory distress, morbidly obese, Lungs: Clear to auscultation bilaterally without wheezes or crackles Cardiovascular: Regular rate and rhythm without murmur gallop or rub normal S1 and S2 Abdomen: Nontender, nondistended, soft, bowel sounds positive, no rebound, no ascites, no appreciable mass Extremities: Bilateral severe lymphedema, painful to palpation  Data Reviewed: Basic Metabolic Panel:  Recent Labs Lab 09/25/14 2131 09/26/14 0304  NA 136 140  K 3.5 3.6  CL 98 107  CO2 26 24  GLUCOSE 296* 275*  BUN 12 14  CREATININE 1.63* 1.74*  CALCIUM 9.1 8.0*   Liver Function Tests:  Recent Labs Lab 09/25/14 2131  AST 20  ALT 15  ALKPHOS 74  BILITOT 1.2  PROT 6.5  ALBUMIN 3.2*   No results for input(s): LIPASE, AMYLASE in the last 168 hours. No results for input(s): AMMONIA in the last 168 hours. CBC:  Recent Labs Lab 09/25/14 2131 09/26/14 0304  WBC 14.7* 16.5*  NEUTROABS 13.5*  --   HGB 12.4 10.7*  HCT 40.0 34.4*  MCV 84.6 84.1  PLT 297 270   Cardiac Enzymes: No results for input(s): CKTOTAL, CKMB, CKMBINDEX, TROPONINI in the  last 168 hours. BNP (last 3 results) No results for input(s): BNP in the last 8760 hours.  ProBNP (last 3 results) No results for input(s): PROBNP in the last 8760 hours.  CBG:  Recent Labs Lab 09/26/14 0142 09/26/14 0818 09/26/14 1143  GLUCAP 263* 267* 277*    Recent Results (from the past 240 hour(s))  MRSA PCR  Screening     Status: None   Collection Time: 09/26/14  5:20 AM  Result Value Ref Range Status   MRSA by PCR NEGATIVE NEGATIVE Final    Comment:        The GeneXpert MRSA Assay (FDA approved for NASAL specimens only), is one component of a comprehensive MRSA colonization surveillance program. It is not intended to diagnose MRSA infection nor to guide or monitor treatment for MRSA infections.      Studies:  Recent x-ray studies have been reviewed in detail by the Attending Physician  Scheduled Meds:  Scheduled Meds: . DULoxetine  30 mg Oral Daily  . enoxaparin (LOVENOX) injection  80 mg Subcutaneous Q24H  . gabapentin  100-200 mg Oral BID  . hydrocerin  1 application Topical BID  . insulin aspart  0-15 Units Subcutaneous TID WC  . insulin aspart  0-5 Units Subcutaneous QHS  . insulin detemir  10 Units Subcutaneous QHS  . irbesartan  75 mg Oral Daily  . piperacillin-tazobactam (ZOSYN)  IV  3.375 g Intravenous Q8H  . vancomycin  1,750 mg Intravenous Q24H    Time spent on care of this patient: 40 mins   WOODS, Geraldo Docker , MD  [facility] Office  [phone] Pager - [phone]  On-Call/Text Page:      [email or web address]      password [email or web address]  If 7PM-7AM, please contact night-coverage [email or web address] Password [email or web address] 09/26/2014, 1:15 PM   LOS: 0 days   Care during the described time interval was provided by me .  I have reviewed this patient's available data, including medical history, events of note, physical examination, radiology studies and test results as part of my evaluation  Dia Crawford, MD [phone] Pager

## 2014-09-26 NOTE — H&P (Signed)
[facility] Admission History and Physical       Jodi Underwood [id number] DOB: 09/28/1945 DOA: 09/25/2014  Referring physician:  EDP PCP: Antony Blackbird, MD  Specialists:   Chief Complaint: Fever and Lower ABD Pain  HPI: Jodi Underwood is a 69 y.o. female with a history of Type II DM, Essential HTN, Asthma, Morbid Obesity, and Chronic Lymphedema of the BLEs who presents to the ED with complaints of fevers and chills and lower ABD pain and palpitations that started today.   She was found to have a fever to 102.5 in the ED, and an initial lactate level of 4.27.   A Sepsis work up was initiated and she was placed on IV Vancomycin and Zosyn.  A UA was positive.    Review of Systems:  Constitutional: No Weight Loss, No Weight Gain, Night Sweats, +Fevers, +Chills, Dizziness, Light Headedness, Fatigue, or Generalized Weakness HEENT: No Headaches, Difficulty Swallowing,Tooth/Dental Problems,Sore Throat,  No Sneezing, Rhinitis, Ear Ache, Nasal Congestion, or Post Nasal Drip,  Cardio-vascular:  No Chest pain, Orthopnea, PND, Edema in Lower Extremities, Anasarca, Dizziness, +Palpitations  Resp: No Dyspnea, No DOE, No Productive Cough, No Non-Productive Cough, No Hemoptysis, No Wheezing.    GI: No Heartburn, Indigestion, +Abdominal Pain, Nausea, Vomiting, Diarrhea, Constipation, Hematemesis, Hematochezia, Melena, Change in Bowel Habits,  Loss of Appetite  GU: No Dysuria, No Change in Color of Urine, No Urgency or Urinary Frequency, No Flank pain.  Musculoskeletal: No Joint Pain or Swelling, No Decreased Range of Motion, No Back Pain.  Neurologic: No Syncope, No Seizures, Muscle Weakness, Paresthesia, Vision Disturbance or Loss, No Diplopia, No Vertigo, No Difficulty Walking,  Skin: No Rash or Lesions. Psych: No Change in Mood or Affect, No Depression or Anxiety, No Memory loss, No Confusion, or Hallucinations   Past Medical History  Diagnosis Date  . Anemia   . Arthritis   .  Asthma   . Diabetes mellitus   . Hypertension   . Urinary incontinence   . Pneumonia   . Degenerative disc disease   . Diabetic neuropathy   . Lymphedema      Past Surgical History  Procedure Laterality Date  . Abdominal hysterectomy      COMPLETE      Prior to Admission medications   Medication Sig Start Date End Date Taking? Authorizing Provider  acetaminophen (TYLENOL) 325 MG tablet Take 2 tablets (650 mg total) by mouth every 6 (six) hours as needed. 01/27/13  Yes Robbie Lis, MD  albuterol (PROVENTIL HFA;VENTOLIN HFA) 108 (90 BASE) MCG/ACT inhaler Inhale 2 puffs into the lungs every 6 (six) hours as needed. 07/24/11  Yes [name], MD  glipiZIDE (GLUCOTROL XL) 10 MG 24 hr tablet Take 10 mg by mouth daily with breakfast.  09/17/14  Yes Historical Provider, MD  metFORMIN (GLUCOPHAGE-XR) 500 MG 24 hr tablet Take 1,000 mg by mouth daily with breakfast.  09/18/14  Yes Historical Provider, MD  traMADol (ULTRAM) 50 MG tablet Take 50 mg by mouth every 6 (six) hours as needed for moderate pain.  09/18/14  Yes Historical Provider, MD  valsartan (DIOVAN) 80 MG tablet Take 80 mg by mouth daily.  09/18/14  Yes Historical Provider, MD  docusate sodium 100 MG CAPS Take 100 mg by mouth 2 (two) times daily. Patient not taking: Reported on 09/25/2014 01/27/13   Robbie Lis, MD  doxycycline (VIBRAMYCIN) 50 MG capsule Take 1 capsule (50 mg total) by mouth 2 (two) times daily. Patient not taking: Reported on  09/25/2014 01/27/13   Robbie Lis, MD  DULoxetine (CYMBALTA) 30 MG capsule Take 1 capsule (30 mg total) by mouth daily. Patient not taking: Reported on 07/21/2014 01/27/13   Robbie Lis, MD  gabapentin (NEURONTIN) 100 MG capsule Take 100-200 mg by mouth 2 (two) times daily.    Historical Provider, MD  glipiZIDE (GLUCOTROL) 5 MG tablet Take 1 tablet (5 mg total) by mouth 2 (two) times daily before a meal. Patient not taking: Reported on 09/25/2014 01/27/13   Robbie Lis, MD  hydrALAZINE  (APRESOLINE) 25 MG tablet Take 1 tablet (25 mg total) by mouth every 8 (eight) hours. Patient not taking: Reported on 09/25/2014 01/27/13   Robbie Lis, MD  hydrocerin (EUCERIN) CREA Apply 1 application topically 2 (two) times daily. Patient not taking: Reported on 09/25/2014 01/27/13   Robbie Lis, MD  insulin aspart (NOVOLOG) 100 UNIT/ML injection Inject 0-20 Units into the skin 3 (three) times daily with meals. Patient not taking: Reported on 09/25/2014 01/27/13   Robbie Lis, MD  insulin detemir (LEVEMIR) 100 UNIT/ML injection Inject 0.2 mLs (20 Units total) into the skin at bedtime. Patient not taking: Reported on 09/25/2014 01/27/13   Robbie Lis, MD  naproxen sodium (ANAPROX) 220 MG tablet Take 220 mg by mouth 2 (two) times daily with a meal.    Historical Provider, MD  oxyCODONE 10 MG TABS Take 1 tablet (10 mg total) by mouth every 4 (four) hours as needed. Patient not taking: Reported on 07/21/2014 01/27/13   Robbie Lis, MD  polyethylene glycol [facility] / Floria Raveling) packet Take 17 g by mouth 2 (two) times daily. Patient not taking: Reported on 09/25/2014 01/27/13   Robbie Lis, MD  sitaGLIPtin (JANUVIA) 100 MG tablet Take 1 tablet (100 mg total) by mouth daily. Patient not taking: Reported on 09/25/2014 10/06/11   [name], MD  traZODone (DESYREL) 50 MG tablet Take 1 tablet (50 mg total) by mouth at bedtime as needed for sleep (insomnia). Patient not taking: Reported on 09/25/2014 01/27/13   Robbie Lis, MD     No Known Allergies  Social History:  reports that she has never smoked. She has never used smokeless tobacco. She reports that she drinks alcohol. She reports that she does not use illicit drugs.    Family History  Problem Relation Age of Onset  . Adopted: Yes       Physical Exam:  GEN:  Pleasant Morbidly Obese 69 y.o. African American female examined and in no acute distress; cooperative with exam Filed Vitals:   09/26/14 0000 09/26/14 0103 09/26/14 0109  09/26/14 0218  BP: 131/50 136/58  142/72  Pulse: 107 99  98  Temp:   100 F (37.8 C) 98.7 F (37.1 C)  TempSrc:   Oral Oral  Resp: 23 22  19   Height:      Weight:      SpO2: 96% 95%  98%   Blood pressure 142/72, pulse 98, temperature 98.7 F (37.1 C), temperature source Oral, resp. rate 19, height 5\' 6"  (1.676 m), weight 172.367 kg (380 lb), SpO2 98 %. PSYCH: She is alert and oriented x4; does not appear anxious does not appear depressed; affect is normal HEENT: Normocephalic and Atraumatic, Mucous membranes pink; PERRLA; EOM intact; Fundi:  Benign;  No scleral icterus, Nares: Patent, Oropharynx: Clear, Fair Dentition,    Neck:  FROM, No Cervical Lymphadenopathy nor Thyromegaly or Carotid Bruit; No JVD; Breasts:: Not examined CHEST WALL:  No tenderness CHEST: Normal respiration, clear to auscultation bilaterally HEART: Regular rate and rhythm; no murmurs rubs or gallops BACK: No kyphosis or scoliosis; No CVA tenderness ABDOMEN: Positive Bowel Sounds, Obese, LargeREducible Ventral hernia, Soft Non-Tender, No Rebound or Guarding; No Masses, No Organomegaly, +Pannus; No Intertriginous candida. Rectal Exam: Not done EXTREMITIES: No Cyanosis, Clubbing, 4+Edema to BLEs ( Chronic); No Ulcerations. Genitalia: not examined PULSES: 2+ and symmetric SKIN: Normal hydration no rash or ulceration CNS:  Alert and Oriented x 4, No Focal Deficits Vascular: pulses palpable throughout    Labs on Admission:  Basic Metabolic Panel:  Recent Labs Lab 09/25/14 2131  NA 136  K 3.5  CL 98  CO2 26  GLUCOSE 296*  BUN 12  CREATININE 1.63*  CALCIUM 9.1   Liver Function Tests:  Recent Labs Lab 09/25/14 2131  AST 20  ALT 15  ALKPHOS 74  BILITOT 1.2  PROT 6.5  ALBUMIN 3.2*   No results for input(s): LIPASE, AMYLASE in the last 168 hours. No results for input(s): AMMONIA in the last 168 hours. CBC:  Recent Labs Lab 09/25/14 2131  WBC 14.7*  NEUTROABS 13.5*  HGB 12.4  HCT 40.0  MCV  84.6  PLT 297   Cardiac Enzymes: No results for input(s): CKTOTAL, CKMB, CKMBINDEX, TROPONINI in the last 168 hours.  BNP (last 3 results) No results for input(s): BNP in the last 8760 hours.  ProBNP (last 3 results) No results for input(s): PROBNP in the last 8760 hours.  CBG:  Recent Labs Lab 09/26/14 0142  GLUCAP 263*    Radiological Exams on Admission: Dg Chest Port 1 View  09/25/2014   CLINICAL DATA:  69 year old female with nausea, vomiting and fever since yesterday.  EXAM: PORTABLE CHEST - 1 VIEW  COMPARISON:  No priors.  FINDINGS: Study is limited by patient rotation. With these limitations in mind, there is no definite acute consolidative airspace disease. No pleural effusions. No evidence of pulmonary edema. Mild cardiomegaly. Upper mediastinal contours are distorted by patient positioning.  IMPRESSION: 1. No radiographic evidence of acute cardiopulmonary disease. 2. Mild cardiomegaly.   Electronically Signed   By: Vinnie Langton M.D.   On: 09/25/2014 22:28        Assessment/Plan:   69 y.o. female with  Principal Problem:   1.    Sepsis   IV  Vancomycin and Zosyn   Active Problems:   2.   UTI (urinary tract infection)   Covered by IV Zosyn     3.   DM type 2, uncontrolled, with neuropathy   Hold Metformin and Glipizide Rx   SSI coverage PRN   Check HbA1c    4.   Essential hypertension   Continue Hydralazine,and VAlsartan Rx   Monitor BPs     5.   Lymphedema   Chronic     6.   Asthma   Albuterol Nebs PRN    7.   Morbid obesity   Patient is beginning Weight loss     8.   DVT Prophylaxis   Lovenox          Code Status:     FULL CODE      Family Communication:    No Family Present    Disposition Plan:    Inpatient  Status        Time spent:  [facility] Pager [phone]   If [place] Please Contact the Day Rounding Team MD  for [facility]  If 7PM-7AM, Please Contact Night-Floor  Coverage  [email or web address] Password [email or web address] 09/26/2014, 2:47 AM     ADDENDUM:   Patient was seen and examined on 09/26/2014

## 2014-09-26 NOTE — Progress Notes (Signed)
ANTIBIOTIC CONSULT NOTE - INITIAL  Pharmacy Consult for Vancomycin and Zosyn Indication: rule out sepsis  No Known Allergies  Patient Measurements: Height: 5\' 6"  (167.6 cm) Weight: (!) 380 lb (172.367 kg) IBW/kg (Calculated) : 59.3  Vital Signs: Temp: 100 F (37.8 C) (03/12 0109) Temp Source: Oral (03/12 0109) BP: 136/58 mmHg (03/12 0103) Pulse Rate: 99 (03/12 0103) Intake/Output from previous day:   Intake/Output from this shift:    Labs:  Recent Labs  09/25/14 2131  WBC 14.7*  HGB 12.4  PLT 297  CREATININE 1.63*   Estimated Creatinine Clearance: 53.7 mL/min (by C-G formula based on Cr of 1.63). No results for input(s): VANCOTROUGH, VANCOPEAK, VANCORANDOM, GENTTROUGH, GENTPEAK, GENTRANDOM, TOBRATROUGH, TOBRAPEAK, TOBRARND, AMIKACINPEAK, AMIKACINTROU, AMIKACIN in the last 72 hours.   Microbiology: No results found for this or any previous visit (from the past 720 hour(s)).  Medical History: Past Medical History  Diagnosis Date  . Anemia   . Arthritis   . Asthma   . Diabetes mellitus   . Hypertension   . Urinary incontinence   . Pneumonia   . Degenerative disc disease   . Diabetic neuropathy   . Lymphedema     Medications:   (Not in a hospital admission) Scheduled:   Infusions:  . piperacillin-tazobactam (ZOSYN)  IV     Assessment: Follow up to redose vancomycin based on labs Admit lab results reviewed, SCr = 1.63, estimated CrCl ~ 54 ml/min in this 69yo female now on vancomycin and zosyn for rule out sepsis. Tc 100, Tm  102.3 , WBC 14.7K.  Pt received vancomycin 2.5g and zosyn 3.375g IV one time in the ED.    Goal of Therapy:  Vancomycin trough level 15-20 mcg/ml  Plan:  Vancomycin 1750 mg IV q24h (due 09/26/14 @ 22:30) Zosyn 3.375g IV q8h Measure antibiotic drug levels at steady state Follow up culture results, renal function, and clinical course  Nicole Cella, RPh Clinical Pharmacist Pager: [phone]  09/26/2014,1:55 AM

## 2014-09-26 NOTE — Progress Notes (Signed)
Has not voided, bladder scan done only 80 cc's per scan.  No complaints.  Will notify MD.  Continue to watch.

## 2014-09-27 ENCOUNTER — Inpatient Hospital Stay (HOSPITAL_COMMUNITY): Payer: Medicare Other

## 2014-09-27 DIAGNOSIS — R0600 Dyspnea, unspecified: Secondary | ICD-10-CM

## 2014-09-27 DIAGNOSIS — R0603 Acute respiratory distress: Secondary | ICD-10-CM | POA: Diagnosis present

## 2014-09-27 DIAGNOSIS — N3000 Acute cystitis without hematuria: Secondary | ICD-10-CM | POA: Diagnosis present

## 2014-09-27 LAB — COMPREHENSIVE METABOLIC PANEL
ALT: 15 U/L (ref 0–35)
AST: 20 U/L (ref 0–37)
Albumin: 2.4 g/dL — ABNORMAL LOW (ref 3.5–5.2)
Alkaline Phosphatase: 76 U/L (ref 39–117)
Anion gap: 8 (ref 5–15)
BILIRUBIN TOTAL: 1.2 mg/dL (ref 0.3–1.2)
BUN: 15 mg/dL (ref 6–23)
CO2: 23 mmol/L (ref 19–32)
Calcium: 7.7 mg/dL — ABNORMAL LOW (ref 8.4–10.5)
Chloride: 107 mmol/L (ref 96–112)
Creatinine, Ser: 2.1 mg/dL — ABNORMAL HIGH (ref 0.50–1.10)
GFR, EST AFRICAN AMERICAN: 27 mL/min — AB (ref >90)
GFR, EST NON AFRICAN AMERICAN: 23 mL/min — AB (ref >90)
Glucose, Bld: 223 mg/dL — ABNORMAL HIGH (ref 70–99)
Potassium: 3.5 mmol/L (ref 3.5–5.1)
Sodium: 138 mmol/L (ref 135–145)
Total Protein: 5.6 g/dL — ABNORMAL LOW (ref 6.0–8.3)

## 2014-09-27 LAB — MAGNESIUM: MAGNESIUM: 1.2 mg/dL — AB (ref 1.5–2.5)

## 2014-09-27 LAB — CBC WITH DIFFERENTIAL/PLATELET
BASOS PCT: 0 % (ref 0–1)
Basophils Absolute: 0 10*3/uL (ref 0.0–0.1)
Eosinophils Absolute: 0 10*3/uL (ref 0.0–0.7)
Eosinophils Relative: 0 % (ref 0–5)
HEMATOCRIT: 32.7 % — AB (ref 36.0–46.0)
Hemoglobin: 10 g/dL — ABNORMAL LOW (ref 12.0–15.0)
LYMPHS ABS: 0.9 10*3/uL (ref 0.7–4.0)
Lymphocytes Relative: 7 % — ABNORMAL LOW (ref 12–46)
MCH: 25.6 pg — ABNORMAL LOW (ref 26.0–34.0)
MCHC: 30.6 g/dL (ref 30.0–36.0)
MCV: 83.8 fL (ref 78.0–100.0)
Monocytes Absolute: 2 10*3/uL — ABNORMAL HIGH (ref 0.1–1.0)
Monocytes Relative: 14 % — ABNORMAL HIGH (ref 3–12)
NEUTROS ABS: 11.4 10*3/uL — AB (ref 1.7–7.7)
NEUTROS PCT: 79 % — AB (ref 43–77)
PLATELETS: 209 10*3/uL (ref 150–400)
RBC: 3.9 MIL/uL (ref 3.87–5.11)
RDW: 13.7 % (ref 11.5–15.5)
WBC: 14.4 10*3/uL — AB (ref 4.0–10.5)

## 2014-09-27 LAB — URINE CULTURE
Colony Count: NO GROWTH
Culture: NO GROWTH

## 2014-09-27 LAB — GLUCOSE, CAPILLARY
Glucose-Capillary: 217 mg/dL — ABNORMAL HIGH (ref 70–99)
Glucose-Capillary: 195 mg/dL — ABNORMAL HIGH (ref 70–99)

## 2014-09-27 LAB — LIPASE, BLOOD: Lipase: 16 U/L (ref 11–59)

## 2014-09-27 LAB — LACTIC ACID, PLASMA: Lactic Acid, Venous: 0.9 mmol/L (ref 0.5–2.0)

## 2014-09-27 MED ORDER — METHYLPREDNISOLONE SODIUM SUCC 125 MG IJ SOLR
80.0000 mg | Freq: Once | INTRAMUSCULAR | Status: AC
  Administered 2014-09-27: 80 mg via INTRAVENOUS

## 2014-09-27 MED ORDER — METOPROLOL TARTRATE 25 MG PO TABS
25.0000 mg | ORAL_TABLET | Freq: Two times a day (BID) | ORAL | Status: DC
  Administered 2014-09-27 – 2014-10-04 (×13): 25 mg via ORAL
  Filled 2014-09-27 (×16): qty 1

## 2014-09-27 MED ORDER — MORPHINE SULFATE 2 MG/ML IJ SOLN
2.0000 mg | Freq: Once | INTRAMUSCULAR | Status: AC
Start: 2014-09-27 — End: 2014-09-27
  Administered 2014-09-27: 2 mg via INTRAVENOUS

## 2014-09-27 MED ORDER — MAGNESIUM SULFATE 50 % IJ SOLN
3.0000 g | Freq: Once | INTRAVENOUS | Status: AC
  Administered 2014-09-27: 3 g via INTRAVENOUS
  Filled 2014-09-27: qty 6

## 2014-09-27 MED ORDER — INSULIN ASPART 100 UNIT/ML ~~LOC~~ SOLN
8.0000 [IU] | Freq: Once | SUBCUTANEOUS | Status: AC
  Administered 2014-09-27: 8 [IU] via SUBCUTANEOUS

## 2014-09-27 MED ORDER — LEVALBUTEROL HCL 0.63 MG/3ML IN NEBU
INHALATION_SOLUTION | RESPIRATORY_TRACT | Status: AC
  Administered 2014-09-27: 0.63 mg
  Filled 2014-09-27: qty 3

## 2014-09-27 MED ORDER — INSULIN DETEMIR 100 UNIT/ML ~~LOC~~ SOLN
18.0000 [IU] | Freq: Every day | SUBCUTANEOUS | Status: DC
  Administered 2014-09-27: 18 [IU] via SUBCUTANEOUS
  Filled 2014-09-27 (×2): qty 0.18

## 2014-09-27 MED ORDER — MORPHINE SULFATE 2 MG/ML IJ SOLN
INTRAMUSCULAR | Status: AC
  Administered 2014-09-27: 2 mg via INTRAVENOUS
  Filled 2014-09-27: qty 1

## 2014-09-27 MED ORDER — FUROSEMIDE 10 MG/ML IJ SOLN
INTRAMUSCULAR | Status: AC
  Filled 2014-09-27: qty 4

## 2014-09-27 MED ORDER — FUROSEMIDE 10 MG/ML IJ SOLN
60.0000 mg | Freq: Once | INTRAMUSCULAR | Status: AC
  Administered 2014-09-27: 60 mg via INTRAVENOUS

## 2014-09-27 MED ORDER — LEVALBUTEROL HCL 1.25 MG/0.5ML IN NEBU
1.2500 mg | INHALATION_SOLUTION | Freq: Four times a day (QID) | RESPIRATORY_TRACT | Status: DC | PRN
  Filled 2014-09-27: qty 0.5

## 2014-09-27 MED ORDER — FUROSEMIDE 10 MG/ML IJ SOLN
INTRAMUSCULAR | Status: AC
  Filled 2014-09-27: qty 2

## 2014-09-27 NOTE — Progress Notes (Signed)
[place] TEAM 1 - Stepdown/ICU TEAM Progress Note  Jodi Underwood [id number] DOB: 12/19/1945 DOA: 09/25/2014 PCP: Antony Blackbird, MD  Admit HPI / Brief Narrative: Jodi Underwood is a 69 y.o.BF PMHx  Type II DM, Essential HTN, Asthma, Morbid Obesity, and Chronic Lymphedema of the BLEs who presents to the ED with complaints of fevers and chills and lower ABD pain and palpitations that started today. She was found to have a fever to 102.5 in the ED, and an initial lactate level of 4.27. A Sepsis work up was initiated and she was placed on IV Vancomycin and Zosyn. A UA was positive.   HPI/Subjective: 3/13  A/O 4, acute respiratory distress, positive extreme SOB, positive moderate expiratory wheezing, states rarely has to use her inhaler at home. Negative CP, positive chills.    Assessment/Plan: Sepsis due to unspecified organism -ContinueIV Vancomycin and Zosyn -Blood culture 1/4 positive staph; awaiting speciation and susceptibility -Acute abdominal series; nondiagnostic for abdominal pain  UTI (urinary tract infection) -Covered by IV Zosyn -Urine culture negative  DM type 2, uncontrolled, with neuropathy -Hemoglobin A1c pending -Lipid panel pending -Continue Levemir 18 units daily -Increase to resistant SSI -3/13 NovoLog 8 units 1  Acute on chronic renal failure -Discontinue Ibesartan -Increasing Cr secondary to fluid overload, hold IV fluids -Lasix 60 mg 1  Essential hypertension -Start metoprolol 25 mg BID  Dilated cardiomyopathy -See essential hypertension -Strict in and out; since admission + 3.9 L -Daily weight; admission weight= 172 kg -Echocardiogram ; see results below  Pulmonary hypertension -Will add a nitrate if patient's BP will tolerate -See essential hypertension  Lymphedema -Consult to WOC/PT/OT for lymphedema wraps  Acute respiratory distress -Patient had acute asthma attack+ flash pulmonary edema  from fluid overload. -Treated Xopenex nebulizer 1, Xopenex QID PRN -Solu-Medrol 80 mg 1 -DC'd fluid -Lasix 60 mg 1  Asthma Albuterol Nebs PRN  Morbid obesity    Code Status: FULL Family Communication: no family present at time of exam Disposition Plan: Resolution sepsis    Consultants: NA  Procedure/Significant Events: 3/12 Acute abdominal series; negative 3/13 echocardiogram;Left ventricle:  moderatefocal basal and moderate concentric hypertrophy of septum. -Systolic function was normal.  - Left atrium: severely dilated. -- Right atrium:moderately dilated. - Pulmonary PA peak pressure: 38 mm Hg (S).  Culture 3/11 blood right arm NGTD 3/11 blood right hand positive GPC 3/11 urine negative 3/12 MRSA by PCR negative 3/12 blood NGTD   Antibiotics: Zosyn 3/12>> Vancomycin 3/11>>  DVT prophylaxis: Lovenox   Devices NA   LINES / TUBES:  NA    Continuous Infusions: . sodium chloride 125 mL/hr at 09/27/14 0600    Objective: VITAL SIGNS: Temp: 98.5 F (36.9 C) (03/13 1202) Temp Source: Oral (03/13 1202) BP: 176/86 mmHg (03/13 1516) Pulse Rate: 115 (03/13 1516) SPO2; FIO2:   Intake/Output Summary (Last 24 hours) at 09/27/14 1524 Last data filed at 09/27/14 1500  Gross per 24 hour  Intake 2974.17 ml  Output      3 ml  Net 2971.17 ml     Exam: General: A/O 4, NAD, Acute respiratory distress, morbidly obese, Lungs: severe diffuse expiratory wheezing, bibasilar crackles  Cardiovascular: Tachycardic, Regular  rhythm without murmur gallop or rub normal S1 and S2 Abdomen: Nontender, nondistended, soft, bowel sounds positive, no rebound, no ascites, no appreciable mass Extremities: Bilateral severe lymphedema, painful to palpation  Data Reviewed: Basic Metabolic Panel:  Recent Labs Lab 09/25/14 2131 09/26/14 0304 09/27/14 0358  NA 136 140 138  K 3.5 3.6  3.5  CL 98 107 107  CO2 26 24 23   GLUCOSE 296* 275* 223*    BUN 12 14 15   CREATININE 1.63* 1.74* 2.10*  CALCIUM 9.1 8.0* 7.7*  MG  --   --  1.2*   Liver Function Tests:  Recent Labs Lab 09/25/14 2131 09/27/14 0358  AST 20 20  ALT 15 15  ALKPHOS 74 76  BILITOT 1.2 1.2  PROT 6.5 5.6*  ALBUMIN 3.2* 2.4*    Recent Labs Lab 09/27/14 0358  LIPASE 16   No results for input(s): AMMONIA in the last 168 hours. CBC:  Recent Labs Lab 09/25/14 2131 09/26/14 0304 09/27/14 0358  WBC 14.7* 16.5* 14.4*  NEUTROABS 13.5*  --  11.4*  HGB 12.4 10.7* 10.0*  HCT 40.0 34.4* 32.7*  MCV 84.6 84.1 83.8  PLT 297 270 209   Cardiac Enzymes: No results for input(s): CKTOTAL, CKMB, CKMBINDEX, TROPONINI in the last 168 hours. BNP (last 3 results) No results for input(s): BNP in the last 8760 hours.  ProBNP (last 3 results) No results for input(s): PROBNP in the last 8760 hours.  CBG:  Recent Labs Lab 09/26/14 1143 09/26/14 1635 09/26/14 2135 09/27/14 0739 09/27/14 1113  GLUCAP 277* 195* 236* 217* 195*    Recent Results (from the past 240 hour(s))  Blood Culture (routine x 2)     Status: None (Preliminary result)   Collection Time: 09/25/14  9:43 PM  Result Value Ref Range Status   Specimen Description BLOOD ARM RIGHT  Final   Special Requests BOTTLES DRAWN AEROBIC AND ANAEROBIC 10CC  Final   Culture   Final           BLOOD CULTURE RECEIVED NO GROWTH TO DATE CULTURE WILL BE HELD FOR 5 DAYS BEFORE ISSUING A FINAL NEGATIVE REPORT Performed at [facility]    Report Status PENDING  Incomplete  Blood Culture (routine x 2)     Status: None (Preliminary result)   Collection Time: 09/25/14  9:58 PM  Result Value Ref Range Status   Specimen Description BLOOD HAND RIGHT  Final   Special Requests BOTTLES DRAWN AEROBIC AND ANAEROBIC 5CC  Final   Culture   Final    GRAM POSITIVE COCCI IN CLUSTERS Note: Gram Stain Report Called to,Read Back By and Verified With: TIM IRBY RN @1105PM  VINCJ [id number] Performed at [facility]     Report Status PENDING  Incomplete  Urine culture     Status: None   Collection Time: 09/25/14 10:20 PM  Result Value Ref Range Status   Specimen Description URINE, RANDOM  Final   Special Requests NONE  Final   Colony Count NO GROWTH Performed at [facility]   Final   Culture NO GROWTH Performed at [facility]   Final   Report Status 09/27/2014 FINAL  Final  MRSA PCR Screening     Status: None   Collection Time: 09/26/14  5:20 AM  Result Value Ref Range Status   MRSA by PCR NEGATIVE NEGATIVE Final    Comment:        The GeneXpert MRSA Assay (FDA approved for NASAL specimens only), is one component of a comprehensive MRSA colonization surveillance program. It is not intended to diagnose MRSA infection nor to guide or monitor treatment for MRSA infections.   Culture, blood (routine x 2)     Status: None (Preliminary result)   Collection Time: 09/26/14  3:30 PM  Result Value Ref Range Status   Specimen  Description BLOOD RIGHT ASSIST CONTROL  Final   Special Requests   Final    BOTTLES DRAWN AEROBIC AND ANAEROBIC 2CC BLUE 5CC RED   Culture   Final           BLOOD CULTURE RECEIVED NO GROWTH TO DATE CULTURE WILL BE HELD FOR 5 DAYS BEFORE ISSUING A FINAL NEGATIVE REPORT Performed at [facility]    Report Status PENDING  Incomplete  Culture, blood (routine x 2)     Status: None (Preliminary result)   Collection Time: 09/26/14  3:42 PM  Result Value Ref Range Status   Specimen Description BLOOD RIGHT HAND  Final   Special Requests BOTTLES DRAWN AEROBIC ONLY [place]  Final   Culture   Final           BLOOD CULTURE RECEIVED NO GROWTH TO DATE CULTURE WILL BE HELD FOR 5 DAYS BEFORE ISSUING A FINAL NEGATIVE REPORT Performed at [facility]    Report Status PENDING  Incomplete     Studies:  Recent x-ray studies have been reviewed in detail by the Attending Physician  Scheduled Meds:  Scheduled Meds: . enoxaparin (LOVENOX) injection  80 mg  Subcutaneous Q24H  . furosemide      . furosemide      . furosemide  60 mg Intravenous Once  . hydrocerin  1 application Topical BID  . insulin aspart  0-20 Units Subcutaneous TID WC  . insulin detemir  18 Units Subcutaneous QHS  . methylPREDNISolone (SOLU-MEDROL) injection  80 mg Intravenous Once  . piperacillin-tazobactam (ZOSYN)  IV  3.375 g Intravenous Q8H  . vancomycin  1,750 mg Intravenous Q24H    Time spent on care of this patient: 40 mins   Lorelei Heikkila, Geraldo Docker , MD  [facility] Office  [phone] Pager - [phone]  On-Call/Text Page:      [email or web address]      password [email or web address]  If 7PM-7AM, please contact night-coverage [email or web address] Password [email or web address] 09/27/2014, 3:24 PM   LOS: 1 day   Care during the described time interval was provided by me .  I have reviewed this patient's available data, including medical history, events of note, physical examination, radiology studies and test results as part of my evaluation  Dia Crawford, MD [phone] Pager

## 2014-09-27 NOTE — Progress Notes (Signed)
Called in to see patient with c/o shortness of breath and chills.  Heart rate 130-140's RAF, also with insp and exp wheezes through out.  Dr. Sherral Hammers in to see patient and ordered xopenex, morphine,solumedrol and lasix.  Stat EKG done, now converted to ST. Placed on oxygen at 4 liters nasal cannula. 1610 feels better.   Unable to get accurate weight on bari bed, patient refused to be moved to new bed or hoyered to get weight.  States, will move to another bari bed tomorrow. Continue to watch.

## 2014-09-27 NOTE — Progress Notes (Signed)
Pt states she is dry and refuses to turn at this time, will continue monitor.

## 2014-09-27 NOTE — Progress Notes (Signed)
  Echocardiogram 2D Echocardiogram has been performed.  Jodi Underwood 09/27/2014, 9:50 AM

## 2014-09-28 LAB — COMPREHENSIVE METABOLIC PANEL
ALT: 40 U/L — AB (ref 0–35)
AST: 57 U/L — AB (ref 0–37)
Albumin: 2.6 g/dL — ABNORMAL LOW (ref 3.5–5.2)
Alkaline Phosphatase: 177 U/L — ABNORMAL HIGH (ref 39–117)
Anion gap: 12 (ref 5–15)
BUN: 20 mg/dL (ref 6–23)
CALCIUM: 7.7 mg/dL — AB (ref 8.4–10.5)
CO2: 19 mmol/L (ref 19–32)
CREATININE: 2.61 mg/dL — AB (ref 0.50–1.10)
Chloride: 104 mmol/L (ref 96–112)
GFR calc non Af Amer: 18 mL/min — ABNORMAL LOW (ref >90)
GFR, EST AFRICAN AMERICAN: 20 mL/min — AB (ref >90)
Glucose, Bld: 382 mg/dL — ABNORMAL HIGH (ref 70–99)
Potassium: 4.1 mmol/L (ref 3.5–5.1)
SODIUM: 135 mmol/L (ref 135–145)
Total Bilirubin: 1.5 mg/dL — ABNORMAL HIGH (ref 0.3–1.2)
Total Protein: 6.2 g/dL (ref 6.0–8.3)

## 2014-09-28 LAB — CBC WITH DIFFERENTIAL/PLATELET
Basophils Absolute: 0 10*3/uL (ref 0.0–0.1)
Basophils Relative: 0 % (ref 0–1)
Eosinophils Absolute: 0 10*3/uL (ref 0.0–0.7)
Eosinophils Relative: 0 % (ref 0–5)
HCT: 34.6 % — ABNORMAL LOW (ref 36.0–46.0)
Hemoglobin: 10.8 g/dL — ABNORMAL LOW (ref 12.0–15.0)
LYMPHS ABS: 1 10*3/uL (ref 0.7–4.0)
LYMPHS PCT: 6 % — AB (ref 12–46)
MCH: 26.7 pg (ref 26.0–34.0)
MCHC: 31.2 g/dL (ref 30.0–36.0)
MCV: 85.6 fL (ref 78.0–100.0)
Monocytes Absolute: 0.6 10*3/uL (ref 0.1–1.0)
Monocytes Relative: 3 % (ref 3–12)
NEUTROS PCT: 91 % — AB (ref 43–77)
Neutro Abs: 15.7 10*3/uL — ABNORMAL HIGH (ref 1.7–7.7)
Platelets: 198 10*3/uL (ref 150–400)
RBC: 4.04 MIL/uL (ref 3.87–5.11)
RDW: 13.8 % (ref 11.5–15.5)
WBC: 17.2 10*3/uL — ABNORMAL HIGH (ref 4.0–10.5)

## 2014-09-28 LAB — GLUCOSE, CAPILLARY
Glucose-Capillary: 189 mg/dL — ABNORMAL HIGH (ref 70–99)
Glucose-Capillary: 352 mg/dL — ABNORMAL HIGH (ref 70–99)
Glucose-Capillary: 335 mg/dL — ABNORMAL HIGH (ref 70–99)
Glucose-Capillary: 397 mg/dL — ABNORMAL HIGH (ref 70–99)
Glucose-Capillary: 399 mg/dL — ABNORMAL HIGH (ref 70–99)
Glucose-Capillary: 365 mg/dL — ABNORMAL HIGH (ref 70–99)

## 2014-09-28 LAB — HEMOGLOBIN A1C
HEMOGLOBIN A1C: 9.5 % — AB (ref 4.8–5.6)
Mean Plasma Glucose: 226 mg/dL

## 2014-09-28 LAB — MAGNESIUM: MAGNESIUM: 1.6 mg/dL (ref 1.5–2.5)

## 2014-09-28 MED ORDER — INSULIN DETEMIR 100 UNIT/ML ~~LOC~~ SOLN
20.0000 [IU] | Freq: Every day | SUBCUTANEOUS | Status: DC
  Administered 2014-09-29 (×2): 20 [IU] via SUBCUTANEOUS
  Filled 2014-09-28 (×4): qty 0.2

## 2014-09-28 MED ORDER — INSULIN ASPART 100 UNIT/ML ~~LOC~~ SOLN: 8.0000 [IU] | Freq: Three times a day (TID) | SUBCUTANEOUS | Status: DC

## 2014-09-28 MED ORDER — LINAGLIPTIN 5 MG PO TABS
5.0000 mg | ORAL_TABLET | Freq: Every day | ORAL | Status: DC
  Administered 2014-09-28 – 2014-09-30 (×3): 5 mg via ORAL
  Filled 2014-09-28 (×3): qty 1

## 2014-09-28 MED ORDER — INSULIN ASPART 100 UNIT/ML ~~LOC~~ SOLN
6.0000 [IU] | Freq: Three times a day (TID) | SUBCUTANEOUS | Status: DC
  Administered 2014-09-28 – 2014-09-29 (×2): 6 [IU] via SUBCUTANEOUS

## 2014-09-28 MED ORDER — CEFTRIAXONE SODIUM IN DEXTROSE 20 MG/ML IV SOLN
1.0000 g | INTRAVENOUS | Status: DC
  Administered 2014-09-28 – 2014-09-30 (×3): 1 g via INTRAVENOUS
  Filled 2014-09-28 (×3): qty 50

## 2014-09-28 MED ORDER — FUROSEMIDE 10 MG/ML IJ SOLN
40.0000 mg | Freq: Two times a day (BID) | INTRAMUSCULAR | Status: DC
  Administered 2014-09-28 – 2014-09-29 (×2): 40 mg via INTRAVENOUS
  Filled 2014-09-28 (×4): qty 4

## 2014-09-28 NOTE — Progress Notes (Signed)
PT Cancellation Note  Patient Details Name: Jodi Underwood MRN: [id number] DOB: 06-12-1946   Cancelled Treatment:    Reason Eval/Treat Not Completed: Medical issues which prohibited therapy (patient's medical condition prohibits her from being a candidate for lymphedema wrapping at this time).  Patient with increased SOB, increased fluid overload, pulmonary edema and acute on chronic renal failure.  I am concerned that pushing fluid from her legs to her abdomen could increase her symptoms and be detrimental at this time.  Will monitor her chart for changes that allow [place] to proceed.  The other concern for Ms. Manton is the lack of ability for follow up.  Lymphedema wrapping requires frequent Oupatient PT visits and/or assistance at home for wrapping, neither of which Ms. Gawlik has access to.  I will look into options and discuss with Ms. Farooq.  Thank you,   Shanna Cisco 09/28/2014, 2:09 PM   09/28/2014 [place], [place]

## 2014-09-28 NOTE — Progress Notes (Signed)
ANTIBIOTIC CONSULT NOTE  Pharmacy Consult for Vancomycin  Indication: rule out sepsis  No Known Allergies  Patient Measurements: Height: 5\' 6"  (167.6 cm) Weight: (!) 380 lb (172.367 kg) IBW/kg (Calculated) : 59.3  Vital Signs: Temp: 97.3 F (36.3 C) (03/14 1226) Temp Source: Oral (03/14 1226) BP: 157/78 mmHg (03/14 1226) Pulse Rate: 75 (03/14 1226) Intake/Output from previous day: 03/13 0701 - 03/14 0700 In: 2325 [P.O.:1440; I.V.:885] Out: -  Intake/Output from this shift: Total I/O In: 530 [P.O.:480; IV Piggyback:50] Out: -   Labs:  Recent Labs  09/26/14 0304 09/27/14 0358 09/28/14 0334  WBC 16.5* 14.4* 17.2*  HGB 10.7* 10.0* 10.8*  PLT 270 209 198  CREATININE 1.74* 2.10* 2.61*   Estimated Creatinine Clearance: 33.6 mL/min (by C-G formula based on Cr of 2.61). No results for input(s): VANCOTROUGH, VANCOPEAK, VANCORANDOM, GENTTROUGH, GENTPEAK, GENTRANDOM, TOBRATROUGH, TOBRAPEAK, TOBRARND, AMIKACINPEAK, AMIKACINTROU, AMIKACIN in the last 72 hours.   Microbiology: Recent Results (from the past 720 hour(s))  Blood Culture (routine x 2)     Status: None (Preliminary result)   Collection Time: 09/25/14  9:43 PM  Result Value Ref Range Status   Specimen Description BLOOD ARM RIGHT  Final   Special Requests BOTTLES DRAWN AEROBIC AND ANAEROBIC 10CC  Final   Culture   Final           BLOOD CULTURE RECEIVED NO GROWTH TO DATE CULTURE WILL BE HELD FOR 5 DAYS BEFORE ISSUING A FINAL NEGATIVE REPORT Performed at [facility]    Report Status PENDING  Incomplete  Blood Culture (routine x 2)     Status: None (Preliminary result)   Collection Time: 09/25/14  9:58 PM  Result Value Ref Range Status   Specimen Description BLOOD HAND RIGHT  Final   Special Requests BOTTLES DRAWN AEROBIC AND ANAEROBIC 5CC  Final   Culture   Final    STAPHYLOCOCCUS SPECIES Note: Gram Stain Report Called to,Read Back By and Verified With: TIM IRBY RN @1105PM  Thelma Comp [id number] Performed at  [facility]    Report Status PENDING  Incomplete  Urine culture     Status: None   Collection Time: 09/25/14 10:20 PM  Result Value Ref Range Status   Specimen Description URINE, RANDOM  Final   Special Requests NONE  Final   Colony Count NO GROWTH Performed at [facility]   Final   Culture NO GROWTH Performed at [facility]   Final   Report Status 09/27/2014 FINAL  Final  MRSA PCR Screening     Status: None   Collection Time: 09/26/14  5:20 AM  Result Value Ref Range Status   MRSA by PCR NEGATIVE NEGATIVE Final    Comment:        The GeneXpert MRSA Assay (FDA approved for NASAL specimens only), is one component of a comprehensive MRSA colonization surveillance program. It is not intended to diagnose MRSA infection nor to guide or monitor treatment for MRSA infections.   Culture, blood (routine x 2)     Status: None (Preliminary result)   Collection Time: 09/26/14  3:30 PM  Result Value Ref Range Status   Specimen Description BLOOD RIGHT ASSIST CONTROL  Final   Special Requests   Final    BOTTLES DRAWN AEROBIC AND ANAEROBIC 2CC BLUE 5CC RED   Culture   Final           BLOOD CULTURE RECEIVED NO GROWTH TO DATE CULTURE WILL BE HELD FOR 5 DAYS BEFORE ISSUING A  FINAL NEGATIVE REPORT Performed at [facility]    Report Status PENDING  Incomplete  Culture, blood (routine x 2)     Status: None (Preliminary result)   Collection Time: 09/26/14  3:42 PM  Result Value Ref Range Status   Specimen Description BLOOD RIGHT HAND  Final   Special Requests BOTTLES DRAWN AEROBIC ONLY [place]  Final   Culture   Final           BLOOD CULTURE RECEIVED NO GROWTH TO DATE CULTURE WILL BE HELD FOR 5 DAYS BEFORE ISSUING A FINAL NEGATIVE REPORT Performed at [facility]    Report Status PENDING  Incomplete   Assessment:  69 yo F on vanc/zosyn for r/o sepsis/UTI.  staph species in 1/4 blood cultures.  WBC 14.4>17.2 today, got solumedrol yesterday 3/13.   AF since tmax 101.6 on 3/13 am.  Creat 2.61. Normalized Creat cl = 23 ml/min.   3/11 vanc>> 3/11 zosyn>> 3/14 3/14 rocephin >>  3/12 BC x2 >>ngtd 3/11 BC x2 1 of 2 staph species 3/11 Ucx NGF    Goal of Therapy:  Vancomycin trough level 15-20 mcg/ml  Plan:  Vancomycin 1750 mg IV q24h, anticipate vanc will be stopped soon if 1/4 BC with staph is a contaminant, if not, we will check a vancomycin trough level New rocephin 1 gm q24 per MD 3/14  Eudelia Bunch, Pharm.D. [id number] 09/28/2014 3:53 PM

## 2014-09-28 NOTE — Progress Notes (Signed)
[place] TEAM 1 - Stepdown/ICU TEAM Progress Note  Jodi Underwood [id number] DOB: 1946/05/10 DOA: 09/25/2014 PCP: Antony Blackbird, MD  Admit HPI / Brief Narrative: 69 y.o. F Hx Type II DM, Essential HTN, Asthma, Morbid Obesity, and Chronic Lymphedema of the BLEs who presented to the ED with complaints of fevers and chills and lower ABD pain and palpitations that started the day of her admit. She was found to have a fever to 102.5 in the ED, and an initial lactate level of 4.27. A sepsis work up was initiated and she was placed on IV Vancomycin and Zosyn. A UA was positive.   HPI/Subjective: Pt is feeling much better today.  She denies cp, n/v, or abdom pain.  She feels very weak.  She is asking about having her LE wrapped w/ unnaboots.    Assessment/Plan:  Sepsis due to UTI -Acute abdominal series nondiagnostic - abdom benign on exam today - pt states she has "passed a lot of gas"  Staph bacteremia? - 1/4 blood cx + awaiting speciation and susceptibility - suspect skin contaminant   UTI / Pyelonephritis  -Urine culture not helpful in identifying the offending organism, but UA was very strongly + so will complete a 7 day empiric course of abx tx  DM type 2, uncontrolled, with neuropathy -A1c 9.5 -CBG very poorly controlled - adjust tx and follow   Acute on chronic renal failure -Discontinue Ibesartan -follow w/ diuresis as pt appear volume overloaded on exam   Essential hypertension -poorly controlled - follow w/ diuresis   Diastolic CHF  -TTE does not report on EF, but reports normal systolic fxn - findings c/w diastolic CHF appreciated  -Strict in and out; since admission + ~6L and now appears volume overloaded  -admission weight= 172 kg but not checked since - ordered daily weights  -Echocardiogram ; see results below  Pulmonary hypertension -Will add a nitrate if patient's BP will tolerate -See essential  hypertension  Lymphedema -Consult to WOC/PT/OT for lymphedema wraps/unna boots   Acute respiratory distress -Patient had acute asthma attack+ flash pulmonary edema from fluid overload -Treated Xopenex nebulizer 1, Xopenex QID PRN -continue attempts at diuresis   Transaminitis  ?due to shock liver - follow trend - further w/u if persists  Asthma Albuterol Nebs PRN  Morbid obesity - Body mass index is 61.36 kg/(m^2).   Code Status: FULL Family Communication: no family present at time of exam Disposition Plan: stable for transfer to med bed - follow Is/Os and renal fxn w/ diuresis - PT/OT - may require prolonged rehab   Consultants: NA  Procedure/Significant Events: 3/12 Acute abdominal series; negative 3/13 echocardiogram;Left ventricle:  moderatefocal basal and moderate concentric hypertrophy of septum. -Systolic function was normal.  - Left atrium: severely dilated. -- Right atrium:moderately dilated. - Pulmonary PA peak pressure: 38 mm Hg (S).  Antibiotics: Zosyn 3/11 > 3/14 Vancomycin 3/11 > Rocephin 3/14 >  DVT prophylaxis: Lovenox  Objective: Blood pressure 137/90, pulse 76, temperature 97.3 F (36.3 C), temperature source Oral, resp. rate 14, height 5\' 6"  (1.676 m), weight 172.367 kg (380 lb), SpO2 99 %.  Intake/Output Summary (Last 24 hours) at 09/28/14 1413 Last data filed at 09/28/14 1130  Gross per 24 hour  Intake   1500 ml  Output      0 ml  Net   1500 ml   Exam: General: no acute resp distress - alert and conversant  Lungs: distant bs th/o - no wheeze - fine crackles diffusely  Cardiovascular:  Distant HS - RRR w/o appreciable M  Abdomen: Nontender, morbidly obese, soft, bowel sounds positive, no rebound Extremities: Bilateral LE severe lymphedema w/ cutaneous change of chronic venous stasis   Data Reviewed: Basic Metabolic Panel:  Recent Labs Lab 09/25/14 2131 09/26/14 0304 09/27/14 0358 09/28/14 0334  NA 136  140 138 135  K 3.5 3.6 3.5 4.1  CL 98 107 107 104  CO2 26 24 23 19   GLUCOSE 296* 275* 223* 382*  BUN 12 14 15 20   CREATININE 1.63* 1.74* 2.10* 2.61*  CALCIUM 9.1 8.0* 7.7* 7.7*  MG  --   --  1.2* 1.6   Liver Function Tests:  Recent Labs Lab 09/25/14 2131 09/27/14 0358 09/28/14 0334  AST 20 20 57*  ALT 15 15 40*  ALKPHOS 74 76 177*  BILITOT 1.2 1.2 1.5*  PROT 6.5 5.6* 6.2  ALBUMIN 3.2* 2.4* 2.6*    Recent Labs Lab 09/27/14 0358  LIPASE 16   CBC:  Recent Labs Lab 09/25/14 2131 09/26/14 0304 09/27/14 0358 09/28/14 0334  WBC 14.7* 16.5* 14.4* 17.2*  NEUTROABS 13.5*  --  11.4* 15.7*  HGB 12.4 10.7* 10.0* 10.8*  HCT 40.0 34.4* 32.7* 34.6*  MCV 84.6 84.1 83.8 85.6  PLT 297 270 209 198   CBG:  Recent Labs Lab 09/27/14 0739 09/27/14 1113 09/27/14 1634 09/28/14 0741 09/28/14 1240  GLUCAP 217* 195* 189* 352* 335*    Recent Results (from the past 240 hour(s))  Blood Culture (routine x 2)     Status: None (Preliminary result)   Collection Time: 09/25/14  9:43 PM  Result Value Ref Range Status   Specimen Description BLOOD ARM RIGHT  Final   Special Requests BOTTLES DRAWN AEROBIC AND ANAEROBIC 10CC  Final   Culture   Final           BLOOD CULTURE RECEIVED NO GROWTH TO DATE CULTURE WILL BE HELD FOR 5 DAYS BEFORE ISSUING A FINAL NEGATIVE REPORT Performed at [facility]    Report Status PENDING  Incomplete  Blood Culture (routine x 2)     Status: None (Preliminary result)   Collection Time: 09/25/14  9:58 PM  Result Value Ref Range Status   Specimen Description BLOOD HAND RIGHT  Final   Special Requests BOTTLES DRAWN AEROBIC AND ANAEROBIC 5CC  Final   Culture   Final    STAPHYLOCOCCUS SPECIES Note: Gram Stain Report Called to,Read Back By and Verified With: Corie Chiquito RN @1105PM  VINCJ [id number] Performed at [facility]    Report Status PENDING  Incomplete  Urine culture     Status: None   Collection Time: 09/25/14 10:20 PM  Result Value Ref  Range Status   Specimen Description URINE, RANDOM  Final   Special Requests NONE  Final   Colony Count NO GROWTH Performed at [facility]   Final   Culture NO GROWTH Performed at [facility]   Final   Report Status 09/27/2014 FINAL  Final  MRSA PCR Screening     Status: None   Collection Time: 09/26/14  5:20 AM  Result Value Ref Range Status   MRSA by PCR NEGATIVE NEGATIVE Final    Comment:        The GeneXpert MRSA Assay (FDA approved for NASAL specimens only), is one component of a comprehensive MRSA colonization surveillance program. It is not intended to diagnose MRSA infection nor to guide or monitor treatment for MRSA infections.   Culture, blood (routine x  2)     Status: None (Preliminary result)   Collection Time: 09/26/14  3:30 PM  Result Value Ref Range Status   Specimen Description BLOOD RIGHT ASSIST CONTROL  Final   Special Requests   Final    BOTTLES DRAWN AEROBIC AND ANAEROBIC 2CC BLUE 5CC RED   Culture   Final           BLOOD CULTURE RECEIVED NO GROWTH TO DATE CULTURE WILL BE HELD FOR 5 DAYS BEFORE ISSUING A FINAL NEGATIVE REPORT Performed at [facility]    Report Status PENDING  Incomplete  Culture, blood (routine x 2)     Status: None (Preliminary result)   Collection Time: 09/26/14  3:42 PM  Result Value Ref Range Status   Specimen Description BLOOD RIGHT HAND  Final   Special Requests BOTTLES DRAWN AEROBIC ONLY [place]  Final   Culture   Final           BLOOD CULTURE RECEIVED NO GROWTH TO DATE CULTURE WILL BE HELD FOR 5 DAYS BEFORE ISSUING A FINAL NEGATIVE REPORT Performed at [facility]    Report Status PENDING  Incomplete     Studies:  Recent x-ray studies have been reviewed in detail by the Attending Physician  Scheduled Meds:  Scheduled Meds: . enoxaparin (LOVENOX) injection  80 mg Subcutaneous Q24H  . hydrocerin  1 application Topical BID  . insulin aspart  0-20 Units Subcutaneous TID WC  . insulin  detemir  18 Units Subcutaneous QHS  . metoprolol tartrate  25 mg Oral BID  . piperacillin-tazobactam (ZOSYN)  IV  3.375 g Intravenous Q8H  . vancomycin  1,750 mg Intravenous Q24H    Time spent on care of this patient: 35 mins  Cherene Altes, MD [facility] For Consults/Admissions - Flow Manager - [phone] Office  [phone]  Contact MD directly via text page:      [email or web address]      password [facility]  09/28/2014, 2:13 PM   LOS: 2 days

## 2014-09-28 NOTE — Progress Notes (Signed)
Medicare Important Message given? YES (If response is "NO", the following Medicare IM given date fields will be blank) Date Medicare IM given:09/28/2014 Medicare IM given by: Whitman Hero

## 2014-09-28 NOTE — Progress Notes (Signed)
Pt was transferred from 3S to 5W via bed. Report received.  She is alert and oriented x4. Not on tele or oxygen. No skin issues. Ronne Binning, Tymon Nemetz L 6:47 PM

## 2014-09-29 LAB — CULTURE, BLOOD (ROUTINE X 2)

## 2014-09-29 LAB — CBC
HEMATOCRIT: 33.4 % — AB (ref 36.0–46.0)
HEMOGLOBIN: 10.4 g/dL — AB (ref 12.0–15.0)
MCH: 25.7 pg — ABNORMAL LOW (ref 26.0–34.0)
MCHC: 31.1 g/dL (ref 30.0–36.0)
MCV: 82.5 fL (ref 78.0–100.0)
Platelets: 261 10*3/uL (ref 150–400)
RBC: 4.05 MIL/uL (ref 3.87–5.11)
RDW: 13.7 % (ref 11.5–15.5)
WBC: 15.8 10*3/uL — AB (ref 4.0–10.5)

## 2014-09-29 LAB — BASIC METABOLIC PANEL
ANION GAP: 8 (ref 5–15)
BUN: 32 mg/dL — ABNORMAL HIGH (ref 6–23)
CALCIUM: 7.8 mg/dL — AB (ref 8.4–10.5)
CO2: 25 mmol/L (ref 19–32)
CREATININE: 2.69 mg/dL — AB (ref 0.50–1.10)
Chloride: 102 mmol/L (ref 96–112)
GFR calc Af Amer: 20 mL/min — ABNORMAL LOW (ref >90)
GFR calc non Af Amer: 17 mL/min — ABNORMAL LOW (ref >90)
Glucose, Bld: 354 mg/dL — ABNORMAL HIGH (ref 70–99)
Potassium: 4.2 mmol/L (ref 3.5–5.1)
Sodium: 135 mmol/L (ref 135–145)

## 2014-09-29 LAB — HEPATIC FUNCTION PANEL
ALBUMIN: 2.5 g/dL — AB (ref 3.5–5.2)
ALT: 54 U/L — AB (ref 0–35)
AST: 40 U/L — ABNORMAL HIGH (ref 0–37)
Alkaline Phosphatase: 167 U/L — ABNORMAL HIGH (ref 39–117)
Bilirubin, Direct: 0.1 mg/dL (ref 0.0–0.5)
Indirect Bilirubin: 0.3 mg/dL (ref 0.3–0.9)
TOTAL PROTEIN: 6.1 g/dL (ref 6.0–8.3)
Total Bilirubin: 0.4 mg/dL (ref 0.3–1.2)

## 2014-09-29 LAB — URINALYSIS, ROUTINE W REFLEX MICROSCOPIC
Bilirubin Urine: NEGATIVE
GLUCOSE, UA: 250 mg/dL — AB
Ketones, ur: NEGATIVE mg/dL
Nitrite: NEGATIVE
Protein, ur: NEGATIVE mg/dL
Specific Gravity, Urine: 1.01 (ref 1.005–1.030)
Urobilinogen, UA: 0.2 mg/dL (ref 0.0–1.0)
pH: 5 (ref 5.0–8.0)

## 2014-09-29 LAB — URINE MICROSCOPIC-ADD ON

## 2014-09-29 LAB — GLUCOSE, CAPILLARY
GLUCOSE-CAPILLARY: 291 mg/dL — AB (ref 70–99)
GLUCOSE-CAPILLARY: 327 mg/dL — AB (ref 70–99)
Glucose-Capillary: 327 mg/dL — ABNORMAL HIGH (ref 70–99)
Glucose-Capillary: 194 mg/dL — ABNORMAL HIGH (ref 70–99)
Glucose-Capillary: 110 mg/dL — ABNORMAL HIGH (ref 70–99)

## 2014-09-29 MED ORDER — GLIPIZIDE ER 10 MG PO TB24
10.0000 mg | ORAL_TABLET | Freq: Every day | ORAL | Status: DC
  Administered 2014-09-30 – 2014-10-04 (×5): 10 mg via ORAL
  Filled 2014-09-29 (×7): qty 1

## 2014-09-29 MED ORDER — INSULIN ASPART 100 UNIT/ML ~~LOC~~ SOLN
10.0000 [IU] | Freq: Three times a day (TID) | SUBCUTANEOUS | Status: DC
  Administered 2014-09-29 (×2): 10 [IU] via SUBCUTANEOUS

## 2014-09-29 MED ORDER — FUROSEMIDE 10 MG/ML IJ SOLN
80.0000 mg | Freq: Two times a day (BID) | INTRAMUSCULAR | Status: DC
  Administered 2014-09-29 – 2014-10-03 (×8): 80 mg via INTRAVENOUS
  Filled 2014-09-29 (×13): qty 8

## 2014-09-29 MED ORDER — ENOXAPARIN SODIUM 40 MG/0.4ML ~~LOC~~ SOLN
40.0000 mg | SUBCUTANEOUS | Status: DC
  Administered 2014-09-30 – 2014-10-03 (×4): 40 mg via SUBCUTANEOUS
  Filled 2014-09-29 (×5): qty 0.4

## 2014-09-29 NOTE — Care Management Note (Signed)
CARE MANAGEMENT NOTE 09/29/2014  Patient:  Jodi Underwood,Jodi Underwood   Account Number:  [id number]  Date Initiated:  09/29/2014  Documentation initiated by:  Marylyn Ishihara  Subjective/Objective Assessment:   Admitted for Sepsis     Action/Plan:   Refused SNF, home with home health.   Anticipated DC Date:  09/30/2014   Anticipated DC Plan:  [place]  CM consult      Choice offered to / List presented to:          [facility] arranged  [place].   Status of service:  In process, will continue to follow Medicare Important Message given?   (If response is "NO", the following Medicare IM given date fields will be blank) Date Medicare IM given:   Medicare IM given by:   Date Additional Medicare IM given:   Additional Medicare IM given by:    Discharge Disposition:  [place]  Per UR Regulation:    If discussed at Long Length of Stay Meetings, dates discussed:    Comments:  09/29/14  1850   CM spoke with patient in her room.  Patient agreed she only needs PT at home.  Discussed with patient if she has assistance at home, says her daughters Gilmore Laroche and Celene Squibb come to see her and where she lives, there is a neighbor that lives there and always around all the time and will be assisting her and her children might arrange for the neighbor to assist her for pay.  Patient was given list of [place] and she chose [facility].  CM paged Jeannetta Nap of [facility] at [phone] and left voicemail message that patient needs PT post discharge.  Cm paged Dr Alene Mires via [email or web address] for orders for Home Health, CM Consult and Face to face form completion, Doris, NP called back and says she will complete the orders but does not know patient and needs full report to do face to face. Doris will complete the [facility] Order and CM consult order, message left with Nurse for treating MD to complete face to face in the morning.   Marylyn Ishihara, RN, BSN, CCM

## 2014-09-29 NOTE — Evaluation (Signed)
Physical Therapy Evaluation Patient Details Name: Jodi Underwood MRN: [id number] DOB: February 27, 1946 Today's Date: 09/29/2014   History of Present Illness  Jodi Underwood is a 69 y.o. female with a history of Type II DM, Essential HTN, Asthma, Morbid Obesity, and Chronic Lymphedema of the BLEs who presents to the ED with complaints of fevers and chills and lower ABD pain and palpitations that started today. She was found to have a fever to 102.5 in the ED, and an initial lactate level of 4.27. A Sepsis work up was initiated and she was placed on IV Vancomycin and Zosyn. A UA was positive  Clinical Impression  Pt admitted with/for fever, shills and lower abdominal pain due to sepsis.  Pt currently limited functionally due to the problems listed below.  (see problems list.)  Pt will benefit from PT to maximize function and safety to be able to get home safely with available assist of family/friends.     Follow Up Recommendations Home health PT;Other (comment) (pt refuses rehab at SNF and choose to go home)    Equipment Recommendations  Other (comment) (needs to get poser chair fixed through medicare)    Recommendations for Other Services       Precautions / Restrictions Precautions Precautions: Fall Restrictions Weight Bearing Restrictions: No      Mobility  Bed Mobility Overal bed mobility: Needs Assistance;+ 2 for safety/equipment Bed Mobility: Supine to Sit;Sit to Supine     Supine to sit: Mod assist Sit to supine: Mod assist   General bed mobility comments: pt moves better than expected given her attempts to get out of moving.  Needs som LE and mild truncal assist  Transfers Overall transfer level: Needs assistance   Transfers: Sit to/from Stand Sit to Stand: Min assist         General transfer comment: stood at EOB for pericare for about 20 secs.  Ambulation/Gait             General Gait Details: not attempted.  Stairs             Wheelchair Mobility    Modified Rankin (Stroke Patients Only)       Balance Overall balance assessment: Needs assistance Sitting-balance support: Feet supported;No upper extremity supported Sitting balance-Leahy Scale: Good     Standing balance support: No upper extremity supported Standing balance-Leahy Scale: Fair Standing balance comment: stood x1 for pericare over approx 20 secs.                             Pertinent Vitals/Pain Pain Assessment: Faces Faces Pain Scale: Hurts a little bit Pain Location: vague Pain Descriptors / Indicators: Grimacing Pain Intervention(s): Monitored during session    Home Living Family/patient expects to be discharged to:: Private residence Living Arrangements: Alone Available Help at Discharge: Family;Friend(s);Available PRN/intermittently Type of Home: Other(Comment) (Extended stay hotel) Home Access: Level entry     Home Layout: One level Home Equipment: Transport chair;Other (comment);Cane - single point;Shower seat (broken power chair) Additional Comments: lately has done all her mobility and adl's from a transport chair.    Prior Function Level of Independence: Independent with assistive device(s)         Comments: pt states she can call her daughter on a whim and she can be there from work.     Hand Dominance        Extremity/Trunk Assessment   Upper Extremity Assessment: Overall WFL for tasks assessed  Lower Extremity Assessment: Overall WFL for tasks assessed;Generalized weakness         Communication      Cognition Arousal/Alertness: Awake/alert Behavior During Therapy: WFL for tasks assessed/performed Overall Cognitive Status: Within Functional Limits for tasks assessed                      General Comments      Exercises        Assessment/Plan    PT Assessment Patient needs continued PT services  PT Diagnosis Difficulty walking;Generalized weakness   PT  Problem List Decreased strength;Decreased activity tolerance;Decreased mobility;Decreased cognition;Decreased knowledge of use of DME  PT Treatment Interventions Gait training;Functional mobility training;Therapeutic activities;Therapeutic exercise;Balance training;Patient/family education   PT Goals (Current goals can be found in the Care Plan section) Acute Rehab PT Goals Patient Stated Goal: get home. PT Goal Formulation: With patient Time For Goal Achievement: 10/13/14 Potential to Achieve Goals: Good    Frequency Min 3X/week   Barriers to discharge        Co-evaluation               End of Session   Activity Tolerance: Patient tolerated treatment well Patient left: in bed;with call bell/phone within reach Nurse Communication: Mobility status         Time: 1330-1408 PT Time Calculation (min) (ACUTE ONLY): 38 min   Charges:   PT Evaluation $Initial PT Evaluation Tier I: 1 Procedure PT Treatments $Therapeutic Activity: 23-37 mins   PT G Codes:        Huriel Matt, Tessie Fass 09/29/2014, 3:08 PM  09/29/2014  Donnella Sham, [place] [phone]  (pager)09/29/2014

## 2014-09-29 NOTE — Clinical Documentation Improvement (Signed)
MD's, NP 'S, and PA's  Documentation of "diastolic heart failure " without acuity please add to diagnosis if known.  Thank you  Possible Clinical Conditions?   Chronic Diastolic Congestive Heart Failure   Acute Diastolic Congestive Heart Failure   Acute on Chronic Diastolic Congestive Heart Failure  Other Condition  Cannot Clinically Determine   Thank You, Ree Kida ,RN Clinical Documentation Specialist:  [place] Information Management

## 2014-09-29 NOTE — Progress Notes (Signed)
Physical Therapy currently following this patient regarding lymphedema management. I agree with Marjorie's note regarding contraindications which limit any manual lymph drainage or wrapping at this time. Will also continue to monitor chart for changes and follow up as appropriate. Thank you for the order.   Chrys Racer , MS, OTR/L, CLT        Pager: [phone]

## 2014-09-29 NOTE — Progress Notes (Signed)
Inpatient Diabetes Program Recommendations  AACE/ADA: New Consensus Statement on Inpatient Glycemic Control (2013)  Target Ranges:  Prepandial:   less than 140 mg/dL      Peak postprandial:   less than 180 mg/dL (1-2 hours)      Critically ill patients:  140 - 180 mg/dL   Reason for Assessment:  Results for ARMITA, WAAG (MRN [id number]) as of 09/29/2014 12:55  Ref. Range 09/28/2014 18:03 09/28/2014 21:21 09/29/2014 00:03 09/29/2014 08:15 09/29/2014 12:24  Glucose-Capillary Latest Range: 70-99 mg/dL 399 (H) 365 (H) 327 (H) 327 (H) 291 (H)   Diabetes history: Type 2 diabetes Outpatient Diabetes medications: Glucotrol XL 10 mg daily and Metformin 1000 mg daily- Medication reconciliation states that patient is not taking Novolog, Levemir or Januvia as ordered at home.  Current orders for Inpatient glycemic control:  Levemir 20 units daily, Novolog 10 units tid with meals, Novolog resistant tid with meals, Tradgenta 5 mg daily  Consider increasing Levemir to 30 units daily.   Thanks, Adah Perl, RN, BC-ADM Inpatient Diabetes Coordinator Pager [phone]

## 2014-09-29 NOTE — Progress Notes (Signed)
MD notified of positive blood cultures

## 2014-09-29 NOTE — Progress Notes (Signed)
[facility] TEAM 1 - Stepdown/ICU TEAM Progress Note  Jodi Underwood [id number] DOB: 06-Sep-1945 DOA: 09/25/2014 PCP: Antony Blackbird, MD  Admit HPI / Brief Narrative: 69 y.o. F Hx Type II DM, Essential HTN, Asthma, Morbid Obesity, and Chronic Lymphedema of the BLEs who presented to the ED with complaints of fevers and chills and lower ABD pain and palpitations that started the day of her admit. She was found to have a fever to 102.5 in the ED, and an initial lactate level of 4.27. A sepsis work up was initiated and she was placed on IV Vancomycin and Zosyn. A UA was positive.   HPI/Subjective: No complaints - we discussed that she was having her legs "wrapped" in [place] for quite a while but she stopped as she decided not to keep traveling from [place] to [place]. She was never able to find a place where they would do them for her in [place]. No dysuria. No new symptoms.   Assessment/Plan:  Sepsis due to UTI/ pyelonephritis -Urine culture not helpful in identifying the offending organism, but UA was very strongly + so will complete a 7 day empiric course of abx tx - repeat UA today to look for clearing  Staph bacteremia? - 1/4 blood cx + awaiting speciation and susceptibility - suspect skin contaminant   Acute on chronic renal failure/Diastolic CHF ? 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 - likely from morbid obesity, diastolic chf - likely has sleep apnea as well  DM type 2, uncontrolled, with  neuropathy -A1c 9.5 -CBG very poorly controlled - oddly she states sugars were very controlled at home on Glipizide and Metformin but we have been holding both and giving insulin and sugars are quite high - resume Glipizide today- will need to hold Metformin due to renal failure adjust tx and follow   Essential hypertension -poorly controlled - follow w/ diuresis   Lymphedema -Consulted WOC/PT/OT for lymphedema wraps/unna boots   Acute respiratory distress -Patient had acute asthma attack+ flash pulmonary edema from fluid overload -Treated Xopenex nebulizer 1, Xopenex QID PRN -continue attempts at diuresis   Transaminitis  Fatty liver? - mild elevation persists  Asthma Albuterol Nebs PRN  Morbid obesity - Body mass index is 64.08 kg/(m^2).   Code Status: FULL Family Communication: no family present at time of exam Disposition Plan: f/u PT/OT - may require prolonged rehab - was only transferring from bed to wheelchair at home  Consultants: NA  Procedure/Significant Events: 3/12 Acute abdominal series; negative 3/13 echocardiogram;Left ventricle:  moderatefocal basal and moderate concentric hypertrophy of septum. -Systolic function was normal.  - Left atrium: severely dilated. -- Right atrium:moderately dilated. - Pulmonary PA peak pressure: 38 mm Hg (S).  Antibiotics: Zosyn 3/11 > 3/14 Vancomycin 3/11 > Rocephin 3/14 >  DVT prophylaxis: Lovenox  Objective: Blood pressure 169/70, pulse 80, temperature 97.8 F (36.6 C), temperature source Oral, resp. rate 20, height 5\' 6"  (1.676 m), weight 180 kg (396 lb 13.3 oz), SpO2 95 %.  Intake/Output Summary (Last 24  hours) at 09/29/14 1221 Last data filed at 09/29/14 0957  Gross per 24 hour  Intake    790 ml  Output      0 ml  Net    790 ml   Exam: General: no acute resp distress - alert and conversant  Lungs: distant bs th/o - no wheeze - fine crackles diffusely   Cardiovascular:  Distant HS  - RRR w/o appreciable M  Abdomen: Nontender, morbidly obese, soft, bowel sounds positive, no rebound Extremities: Bilateral LE severe lymphedema w/ cutaneous change of chronic venous stasis   Data Reviewed: Basic Metabolic Panel:  Recent Labs Lab 09/25/14 2131 09/26/14 0304 09/27/14 0358 09/28/14 0334 09/29/14 0554  NA 136 140 138 135 135  K 3.5 3.6 3.5 4.1 4.2  CL 98 107 107 104 102  CO2 26 24 23 19 25   GLUCOSE 296* 275* 223* 382* 354*  BUN 12 14 15 20  32*  CREATININE 1.63* 1.74* 2.10* 2.61* 2.69*  CALCIUM 9.1 8.0* 7.7* 7.7* 7.8*  MG  --   --  1.2* 1.6  --    Liver Function Tests:  Recent Labs Lab 09/25/14 2131 09/27/14 0358 09/28/14 0334 09/29/14 0554  AST 20 20 57* 40*  ALT 15 15 40* 54*  ALKPHOS 74 76 177* 167*  BILITOT 1.2 1.2 1.5* 0.4  PROT 6.5 5.6* 6.2 6.1  ALBUMIN 3.2* 2.4* 2.6* 2.5*    Recent Labs Lab 09/27/14 0358  LIPASE 16   CBC:  Recent Labs Lab 09/25/14 2131 09/26/14 0304 09/27/14 0358 09/28/14 0334 09/29/14 0554  WBC 14.7* 16.5* 14.4* 17.2* 15.8*  NEUTROABS 13.5*  --  11.4* 15.7*  --   HGB 12.4 10.7* 10.0* 10.8* 10.4*  HCT 40.0 34.4* 32.7* 34.6* 33.4*  MCV 84.6 84.1 83.8 85.6 82.5  PLT 297 270 209 198 261   CBG:  Recent Labs Lab 09/28/14 1548 09/28/14 1803 09/28/14 2121 09/29/14 0003 09/29/14 0815  GLUCAP 397* 399* 365* 327* 327*    Recent Results (from the past 240 hour(s))  Blood Culture (routine x 2)     Status: None (Preliminary result)   Collection Time: 09/25/14  9:43 PM  Result Value Ref Range Status   Specimen Description BLOOD ARM RIGHT  Final   Special Requests BOTTLES DRAWN AEROBIC AND ANAEROBIC 10CC  Final   Culture   Final    GRAM NEGATIVE RODS Note: Gram Stain Report Called to,Read Back By and Verified With: Jene Every 09/29/14 0950 BY SMITHERSJ Performed at [facility]    Report Status PENDING  Incomplete  Blood Culture (routine x 2)     Status: None   Collection Time: 09/25/14  9:58 PM    Result Value Ref Range Status   Specimen Description BLOOD HAND RIGHT  Final   Special Requests BOTTLES DRAWN AEROBIC AND ANAEROBIC 5CC  Final   Culture   Final    STAPHYLOCOCCUS SPECIES (COAGULASE NEGATIVE) Note: THE SIGNIFICANCE OF ISOLATING THIS ORGANISM FROM A SINGLE SET OF BLOOD CULTURES WHEN MULTIPLE SETS ARE DRAWN IS UNCERTAIN. PLEASE NOTIFY THE MICROBIOLOGY DEPARTMENT WITHIN ONE WEEK IF SPECIATION AND SENSITIVITIES ARE REQUIRED. Note: Gram Stain Report Called to,Read Back By and Verified With: Corie Chiquito RN @1105PM  Thelma Comp [id number] Performed at [facility]    Report Status 09/29/2014 FINAL  Final  Urine culture     Status: None   Collection Time: 09/25/14 10:20 PM  Result Value Ref Range Status   Specimen Description URINE, RANDOM  Final   Special  Requests NONE  Final   Colony Count NO GROWTH Performed at [facility]   Final   Culture NO GROWTH Performed at [facility]   Final   Report Status 09/27/2014 FINAL  Final  MRSA PCR Screening     Status: None   Collection Time: 09/26/14  5:20 AM  Result Value Ref Range Status   MRSA by PCR NEGATIVE NEGATIVE Final    Comment:        The GeneXpert MRSA Assay (FDA approved for NASAL specimens only), is one component of a comprehensive MRSA colonization surveillance program. It is not intended to diagnose MRSA infection nor to guide or monitor treatment for MRSA infections.   Culture, blood (routine x 2)     Status: None (Preliminary result)   Collection Time: 09/26/14  3:30 PM  Result Value Ref Range Status   Specimen Description BLOOD RIGHT ASSIST CONTROL  Final   Special Requests   Final    BOTTLES DRAWN AEROBIC AND ANAEROBIC 2CC BLUE 5CC RED   Culture   Final           BLOOD CULTURE RECEIVED NO GROWTH TO DATE CULTURE WILL BE HELD FOR 5 DAYS BEFORE ISSUING A FINAL NEGATIVE REPORT Performed at [facility]    Report Status PENDING  Incomplete  Culture, blood (routine x 2)     Status: None  (Preliminary result)   Collection Time: 09/26/14  3:42 PM  Result Value Ref Range Status   Specimen Description BLOOD RIGHT HAND  Final   Special Requests BOTTLES DRAWN AEROBIC ONLY [place]  Final   Culture   Final           BLOOD CULTURE RECEIVED NO GROWTH TO DATE CULTURE WILL BE HELD FOR 5 DAYS BEFORE ISSUING A FINAL NEGATIVE REPORT Performed at [facility]    Report Status PENDING  Incomplete     Studies:  Recent x-ray studies have been reviewed in detail by the Attending Physician  Scheduled Meds:  Scheduled Meds: . cefTRIAXone (ROCEPHIN)  IV  1 g Intravenous Q24H  . enoxaparin (LOVENOX) injection  80 mg Subcutaneous Q24H  . furosemide  40 mg Intravenous Q12H  . [START ON 09/30/2014] glipiZIDE  10 mg Oral Q breakfast  . hydrocerin  1 application Topical BID  . insulin aspart  0-20 Units Subcutaneous TID WC  . insulin aspart  10 Units Subcutaneous TID WC  . insulin detemir  20 Units Subcutaneous QHS  . linagliptin  5 mg Oral Daily  . metoprolol tartrate  25 mg Oral BID  . vancomycin  1,750 mg Intravenous Q24H    Time spent on care of this patient: 35 mins  Debbe Odea, MD [facility] For Consults/Admissions - Flow Manager - [phone] Office  [phone]  Contact MD directly via text page:      [email or web address]      password [facility]  09/29/2014, 12:21 PM   LOS: 3 days

## 2014-09-30 LAB — CBC
HCT: 35.1 % — ABNORMAL LOW (ref 36.0–46.0)
Hemoglobin: 10.9 g/dL — ABNORMAL LOW (ref 12.0–15.0)
MCH: 25.6 pg — AB (ref 26.0–34.0)
MCHC: 31.1 g/dL (ref 30.0–36.0)
MCV: 82.6 fL (ref 78.0–100.0)
Platelets: 303 10*3/uL (ref 150–400)
RBC: 4.25 MIL/uL (ref 3.87–5.11)
RDW: 13.7 % (ref 11.5–15.5)
WBC: 15 10*3/uL — ABNORMAL HIGH (ref 4.0–10.5)

## 2014-09-30 LAB — BASIC METABOLIC PANEL
ANION GAP: 9 (ref 5–15)
BUN: 30 mg/dL — ABNORMAL HIGH (ref 6–23)
CO2: 29 mmol/L (ref 19–32)
CREATININE: 2.6 mg/dL — AB (ref 0.50–1.10)
Calcium: 8.1 mg/dL — ABNORMAL LOW (ref 8.4–10.5)
Chloride: 101 mmol/L (ref 96–112)
GFR calc non Af Amer: 18 mL/min — ABNORMAL LOW (ref >90)
GFR, EST AFRICAN AMERICAN: 20 mL/min — AB (ref >90)
GLUCOSE: 137 mg/dL — AB (ref 70–99)
Potassium: 3.4 mmol/L — ABNORMAL LOW (ref 3.5–5.1)
SODIUM: 139 mmol/L (ref 135–145)

## 2014-09-30 LAB — GLUCOSE, CAPILLARY
GLUCOSE-CAPILLARY: 138 mg/dL — AB (ref 70–99)
GLUCOSE-CAPILLARY: 193 mg/dL — AB (ref 70–99)
Glucose-Capillary: 192 mg/dL — ABNORMAL HIGH (ref 70–99)
Glucose-Capillary: 190 mg/dL — ABNORMAL HIGH (ref 70–99)

## 2014-09-30 MED ORDER — CEFTRIAXONE SODIUM IN DEXTROSE 40 MG/ML IV SOLN
2.0000 g | INTRAVENOUS | Status: DC
  Administered 2014-10-01 – 2014-10-03 (×3): 2 g via INTRAVENOUS
  Filled 2014-09-30 (×4): qty 50

## 2014-09-30 MED ORDER — POTASSIUM CHLORIDE CRYS ER 20 MEQ PO TBCR
40.0000 meq | EXTENDED_RELEASE_TABLET | Freq: Every day | ORAL | Status: DC
  Administered 2014-09-30 – 2014-10-02 (×3): 40 meq via ORAL
  Filled 2014-09-30 (×4): qty 2

## 2014-09-30 NOTE — Progress Notes (Signed)
PT Lymphedema Note Patient still presenting with increased fluid overload and Acute on Chronic Renal Failure per MD, with several kidney function lab values out of range.  Do not feel that with this clinical presentation it is appropriate to perform lymphedema wrapping on her legs at this time, as this would push more fluid to her kidneys.  I will continue to monitor her for when this procedure might be appropriate.  I am continuing to work on community resources for f/u lymphedema wrapping.  Unfortunately, there are very few community resources and none that I have found in [facility] and patient cannot get transportation to [facility] due to the distance from her home. Will continue to follow, 09/30/2014 [place], Jodi Underwood

## 2014-09-30 NOTE — Progress Notes (Signed)
[facility] TEAM 1 - Stepdown/ICU TEAM Progress Note  Jodi Underwood [id number] DOB: [birth year] DOA: 09/25/2014 PCP: Antony Blackbird, MD  Admit HPI / Brief Narrative: 69 y.o. F Hx Type II DM, Essential HTN, Asthma, Morbid Obesity, and Chronic Lymphedema of the BLEs who presented to the ED with complaints of fevers and chills and lower ABD pain and palpitations that started the day of her admit. She was found to have a fever to 102.5 in the ED, and an initial lactate level of 4.27. A sepsis work up was initiated and she was placed on IV Vancomycin and Zosyn. A UA was positive.   HPI/Subjective: No complaints - getting up to bedside commode.   Assessment/Plan:  Sepsis due to UTI/ pyelonephritis- gr neg bacteremia -Urine culture not helpful in identifying the offending organism, but UA was very strongly + so will complete a 7 day empiric course of abx tx- abx started 3/11 - repeat UA on 3/15 looks like infection is clearing - one bld cx came back positive for gr neg rods- cont to follow- repeat cultures today  Staph bacteremia? - 1/4 blood cx + Is coag negative stap and therefore a contaminant  Acute on chronic renal failure/Diastolic CHF ? Vs fluid overload -Discontinue Ibesartan -follow w/ diuresis as pt appear volume overloaded on exam - weight is up by 16 lbs - unable to follow I and O as she is incontinent in the bed- follow daily weights and renal function -Echocardiogram-TTE- reports normal systolic fxn - no mention of diastolic failure but has LA dilatation which is highly suggestive of diastolic CHF -admission weight= 172 kg - now 186kg despite being given Lasix- I and O inaccurate as she is incontinent most of the time - will need to place foley today to follow I and O as weight also appears inaccurate- she is laying in urine all of the time due to her incontinence  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 - likely from morbid obesity, diastolic chf - likely has sleep apnea as  well  DM type 2, uncontrolled, with neuropathy -A1c 9.5 -CBG very poorly controlled - oddly she states sugars were very controlled at home on Glipizide and Metformin but we have been holding both and giving insulin and sugars are quite high - resumed Glipizide on 3/15 with significant improvement in sugars - suspect Insluin is not being absorbed (due to her obesity?)- will d/c Insulin and follow sugars- will need to hold Metformin due to renal failure   Essential hypertension -poorly controlled - follow w/ diuresis   Lymphedema -Consulted WOC/PT/OT for lymphedema wraps/unna boots - they are recommending we wait until fluid overload resolves prior to placing the wraps  Acute respiratory distress -Patient had acute asthma attack+ flash pulmonary edema from fluid overload -continue attempts at diuresis   Transaminitis  Fatty liver? - mild elevation persists  Asthma Albuterol Nebs PRN  Morbid obesity - Body mass index is 66.22 kg/(m^2).   Code Status: FULL Family Communication: no family present at time of exam Disposition Plan: declines SNF - will go back home with HHPT- wheelchair at home  Consultants: NA  Procedure/Significant Events: 3/12 Acute abdominal series; negative 3/13 echocardiogram;Left ventricle:  moderatefocal basal and moderate concentric hypertrophy of septum. -Systolic function was normal.  - Left atrium: severely dilated. -- Right atrium:moderately dilated. - Pulmonary PA peak pressure: 38 mm Hg (S).  Antibiotics: Anti-infectives    Start     Dose/Rate Route Frequency Ordered Stop   09/28/14 1700  cefTRIAXone (ROCEPHIN)  1 g in dextrose 5 % 50 mL IVPB - Premix     1 g 100 mL/hr over 30 Minutes Intravenous Every 24 hours 09/28/14 1432     09/26/14 2230  vancomycin (VANCOCIN) 1,750 mg in sodium chloride 0.9 % 500 mL IVPB  Status:  Discontinued     1,750 mg 250 mL/hr over 120 Minutes Intravenous Every 24 hours 09/26/14 0202 09/29/14  1623   09/26/14 0330  piperacillin-tazobactam (ZOSYN) IVPB 3.375 g  Status:  Discontinued     3.375 g 12.5 mL/hr over 240 Minutes Intravenous Every 8 hours 09/25/14 2129 09/28/14 1432   09/25/14 2130  piperacillin-tazobactam (ZOSYN) IVPB 3.375 g     3.375 g 100 mL/hr over 30 Minutes Intravenous  Once 09/25/14 2119 09/25/14 2233   09/25/14 2130  vancomycin (VANCOCIN) IVPB 1000 mg/200 mL premix  Status:  Discontinued     1,000 mg 200 mL/hr over 60 Minutes Intravenous  Once 09/25/14 2119 09/25/14 2125   09/25/14 2130  vancomycin (VANCOCIN) 2,500 mg in sodium chloride 0.9 % 500 mL IVPB     2,500 mg 250 mL/hr over 120 Minutes Intravenous  Once 09/25/14 2125 09/26/14 0107      DVT prophylaxis: Lovenox  Objective: Blood pressure 157/62, pulse 90, temperature 98.8 F (37.1 C), temperature source Oral, resp. rate 18, height 5\' 6"  (1.676 m), weight 186 kg (410 lb 0.9 oz), SpO2 95 %.  Intake/Output Summary (Last 24 hours) at 09/30/14 1209 Last data filed at 09/30/14 [id number]  Gross per 24 hour  Intake     50 ml  Output    300 ml  Net   -250 ml   Exam: General: no acute resp distress - alert and conversant  Lungs: distant breath sounds Cardiovascular:  Distant HS - RRR w/o appreciable M  Abdomen: Nontender, morbidly obese, soft, bowel sounds positive, no rebound Extremities: Bilateral LE severe lymphedema w/ cutaneous change of chronic venous stasis   Data Reviewed: Basic Metabolic Panel:  Recent Labs Lab 09/26/14 0304 09/27/14 0358 09/28/14 0334 09/29/14 0554 09/30/14 0607  NA 140 138 135 135 139  K 3.6 3.5 4.1 4.2 3.4*  CL 107 107 104 102 101  CO2 24 23 19 25 29   GLUCOSE 275* 223* 382* 354* 137*  BUN 14 15 20  32* 30*  CREATININE 1.74* 2.10* 2.61* 2.69* 2.60*  CALCIUM 8.0* 7.7* 7.7* 7.8* 8.1*  MG  --  1.2* 1.6  --   --    Liver Function Tests:  Recent Labs Lab 09/25/14 2131 09/27/14 0358 09/28/14 0334 09/29/14 0554  AST 20 20 57* 40*  ALT 15 15 40* 54*  ALKPHOS 74  76 177* 167*  BILITOT 1.2 1.2 1.5* 0.4  PROT 6.5 5.6* 6.2 6.1  ALBUMIN 3.2* 2.4* 2.6* 2.5*    Recent Labs Lab 09/27/14 0358  LIPASE 16   CBC:  Recent Labs Lab 09/25/14 2131 09/26/14 0304 09/27/14 0358 09/28/14 0334 09/29/14 0554 09/30/14 0607  WBC 14.7* 16.5* 14.4* 17.2* 15.8* 15.0*  NEUTROABS 13.5*  --  11.4* 15.7*  --   --   HGB 12.4 10.7* 10.0* 10.8* 10.4* 10.9*  HCT 40.0 34.4* 32.7* 34.6* 33.4* 35.1*  MCV 84.6 84.1 83.8 85.6 82.5 82.6  PLT 297 270 209 198 261 303   CBG:  Recent Labs Lab 09/29/14 0815 09/29/14 1224 09/29/14 1721 09/29/14 2128 09/30/14 0743  GLUCAP 327* 291* 194* 110* 138*    Recent Results (from the past 240 hour(s))  Blood Culture (routine x 2)  Status: None (Preliminary result)   Collection Time: 09/25/14  9:43 PM  Result Value Ref Range Status   Specimen Description BLOOD ARM RIGHT  Final   Special Requests BOTTLES DRAWN AEROBIC AND ANAEROBIC 10CC  Final   Culture   Final    GRAM NEGATIVE RODS Note: Gram Stain Report Called to,Read Back By and Verified With: Minna Merritts [facility] 09/29/14 0950 BY SMITHERSJ Performed at [facility]    Report Status PENDING  Incomplete  Blood Culture (routine x 2)     Status: None   Collection Time: 09/25/14  9:58 PM  Result Value Ref Range Status   Specimen Description BLOOD HAND RIGHT  Final   Special Requests BOTTLES DRAWN AEROBIC AND ANAEROBIC 5CC  Final   Culture   Final    STAPHYLOCOCCUS SPECIES (COAGULASE NEGATIVE) Note: THE SIGNIFICANCE OF ISOLATING THIS ORGANISM FROM A SINGLE SET OF BLOOD CULTURES WHEN MULTIPLE SETS ARE DRAWN IS UNCERTAIN. PLEASE NOTIFY THE MICROBIOLOGY DEPARTMENT WITHIN ONE WEEK IF SPECIATION AND SENSITIVITIES ARE REQUIRED. Note: Gram Stain Report Called to,Read Back By and Verified With: Corie Chiquito RN @1105PM  Thelma Comp [id number] Performed at [facility]    Report Status 09/29/2014 FINAL  Final  Urine culture     Status: None   Collection Time: 09/25/14 10:20 PM    Result Value Ref Range Status   Specimen Description URINE, RANDOM  Final   Special Requests NONE  Final   Colony Count NO GROWTH Performed at [facility]   Final   Culture NO GROWTH Performed at [facility]   Final   Report Status 09/27/2014 FINAL  Final  MRSA PCR Screening     Status: None   Collection Time: 09/26/14  5:20 AM  Result Value Ref Range Status   MRSA by PCR NEGATIVE NEGATIVE Final    Comment:        The GeneXpert MRSA Assay (FDA approved for NASAL specimens only), is one component of a comprehensive MRSA colonization surveillance program. It is not intended to diagnose MRSA infection nor to guide or monitor treatment for MRSA infections.   Culture, blood (routine x 2)     Status: None (Preliminary result)   Collection Time: 09/26/14  3:30 PM  Result Value Ref Range Status   Specimen Description BLOOD RIGHT ASSIST CONTROL  Final   Special Requests   Final    BOTTLES DRAWN AEROBIC AND ANAEROBIC 2CC BLUE 5CC RED   Culture   Final           BLOOD CULTURE RECEIVED NO GROWTH TO DATE CULTURE WILL BE HELD FOR 5 DAYS BEFORE ISSUING A FINAL NEGATIVE REPORT Performed at [facility]    Report Status PENDING  Incomplete  Culture, blood (routine x 2)     Status: None (Preliminary result)   Collection Time: 09/26/14  3:42 PM  Result Value Ref Range Status   Specimen Description BLOOD RIGHT HAND  Final   Special Requests BOTTLES DRAWN AEROBIC ONLY [place]  Final   Culture   Final           BLOOD CULTURE RECEIVED NO GROWTH TO DATE CULTURE WILL BE HELD FOR 5 DAYS BEFORE ISSUING A FINAL NEGATIVE REPORT Performed at [facility]    Report Status PENDING  Incomplete     Studies:  Recent x-ray studies have been reviewed in detail by the Attending Physician  Scheduled Meds:  Scheduled Meds: . cefTRIAXone (ROCEPHIN)  IV  1 g Intravenous Q24H  .  enoxaparin (LOVENOX) injection  40 mg Subcutaneous Q24H  . furosemide  80 mg Intravenous Q12H   . glipiZIDE  10 mg Oral Q breakfast  . hydrocerin  1 application Topical BID  . insulin aspart  0-20 Units Subcutaneous TID WC  . insulin aspart  10 Units Subcutaneous TID WC  . insulin detemir  20 Units Subcutaneous QHS  . linagliptin  5 mg Oral Daily  . metoprolol tartrate  25 mg Oral BID    Time spent on care of this patient: 35 mins  Debbe Odea, MD [facility] For Consults/Admissions - Flow Manager [phone] Office  [phone]  Contact MD directly via text page:      [email or web address]      password [facility]  09/30/2014, 12:09 PM   LOS: 4 days

## 2014-09-30 NOTE — Progress Notes (Signed)
Attempted to convince pt to get up to chair today and to use the hoyer lift to get an accurate weight.  Pt stated "I had a lot going on today and I really don't want to get up.  Plus my legs swell when I sit up for too long."  Informed that we can elevate the legs in the chair.  Pt refused.

## 2014-09-30 NOTE — Progress Notes (Signed)
Spoke briefly with patient regarding A1C=9.1% and diabetes medications.  According to patient she has not been on insulin in "months".  She states that when she saw MD a few weeks ago, her A1C was 8.6%.  Discussed that this is still greater than goal.  Note that all insulin stopped today and patient only on oral agents.  Based on A1C and trends of CBG's in the hospital, it appears that she would benefit at least from basal insulin at discharge to help control fasting CBG's.  Fasting CBG this morning=138 mg/dL after receiving Levemir 20 units.  Will reassess on 3/17 regarding CBG's without insulin.  Discussed briefly with patient.  Thanks, Adah Perl, RN, BC-ADM Inpatient Diabetes Coordinator Pager [phone]

## 2014-09-30 NOTE — Clinical Social Work Note (Signed)
CSW met with patient at bedside as PT evaluation indicated that the patient does not want to go to SNF at discharge. CSW inquired about this and patient does confirm that she does NOT and will NOT go to a SNF at discharge. The patient plans to return home with assistance from her neighbor and daughter. CSW has made RNCM aware of situation. CSW signing off at this time.   Jodi Underwood MSW, [place], [place], [phone]

## 2014-10-01 DIAGNOSIS — R7881 Bacteremia: Secondary | ICD-10-CM

## 2014-10-01 DIAGNOSIS — B961 Klebsiella pneumoniae [K. pneumoniae] as the cause of diseases classified elsewhere: Secondary | ICD-10-CM

## 2014-10-01 LAB — BASIC METABOLIC PANEL
Anion gap: 15 (ref 5–15)
BUN: 27 mg/dL — AB (ref 6–23)
CO2: 29 mmol/L (ref 19–32)
Calcium: 8 mg/dL — ABNORMAL LOW (ref 8.4–10.5)
Chloride: 94 mmol/L — ABNORMAL LOW (ref 96–112)
Creatinine, Ser: 2.36 mg/dL — ABNORMAL HIGH (ref 0.50–1.10)
GFR, EST AFRICAN AMERICAN: 23 mL/min — AB (ref >90)
GFR, EST NON AFRICAN AMERICAN: 20 mL/min — AB (ref >90)
Glucose, Bld: 236 mg/dL — ABNORMAL HIGH (ref 70–99)
POTASSIUM: 3 mmol/L — AB (ref 3.5–5.1)
Sodium: 138 mmol/L (ref 135–145)

## 2014-10-01 LAB — CBC
HCT: 35.4 % — ABNORMAL LOW (ref 36.0–46.0)
Hemoglobin: 11.3 g/dL — ABNORMAL LOW (ref 12.0–15.0)
MCH: 26.3 pg (ref 26.0–34.0)
MCHC: 31.9 g/dL (ref 30.0–36.0)
MCV: 82.5 fL (ref 78.0–100.0)
PLATELETS: 294 10*3/uL (ref 150–400)
RBC: 4.29 MIL/uL (ref 3.87–5.11)
RDW: 13.3 % (ref 11.5–15.5)
WBC: 13.3 10*3/uL — AB (ref 4.0–10.5)

## 2014-10-01 LAB — GLUCOSE, CAPILLARY
GLUCOSE-CAPILLARY: 269 mg/dL — AB (ref 70–99)
Glucose-Capillary: 210 mg/dL — ABNORMAL HIGH (ref 70–99)
Glucose-Capillary: 318 mg/dL — ABNORMAL HIGH (ref 70–99)
Glucose-Capillary: 274 mg/dL — ABNORMAL HIGH (ref 70–99)

## 2014-10-01 LAB — CULTURE, BLOOD (ROUTINE X 2)

## 2014-10-01 LAB — CLOSTRIDIUM DIFFICILE BY PCR: CDIFFPCR: NEGATIVE

## 2014-10-01 MED ORDER — INSULIN DETEMIR 100 UNIT/ML ~~LOC~~ SOLN
10.0000 [IU] | Freq: Every day | SUBCUTANEOUS | Status: DC
  Administered 2014-10-01 – 2014-10-02 (×2): 10 [IU] via SUBCUTANEOUS
  Filled 2014-10-01 (×3): qty 0.1

## 2014-10-01 MED ORDER — INSULIN ASPART 100 UNIT/ML ~~LOC~~ SOLN
0.0000 [IU] | Freq: Every day | SUBCUTANEOUS | Status: DC
  Administered 2014-10-01: 3 [IU] via SUBCUTANEOUS
  Administered 2014-10-02 – 2014-10-03 (×2): 2 [IU] via SUBCUTANEOUS

## 2014-10-01 MED ORDER — INSULIN ASPART 100 UNIT/ML ~~LOC~~ SOLN
0.0000 [IU] | Freq: Three times a day (TID) | SUBCUTANEOUS | Status: DC
  Administered 2014-10-02 (×2): 5 [IU] via SUBCUTANEOUS
  Administered 2014-10-02 – 2014-10-03 (×2): 8 [IU] via SUBCUTANEOUS
  Administered 2014-10-03: 11 [IU] via SUBCUTANEOUS
  Administered 2014-10-03: 3 [IU] via SUBCUTANEOUS
  Administered 2014-10-04: 5 [IU] via SUBCUTANEOUS
  Administered 2014-10-04: 11 [IU] via SUBCUTANEOUS

## 2014-10-01 MED ORDER — POTASSIUM CHLORIDE CRYS ER 20 MEQ PO TBCR
40.0000 meq | EXTENDED_RELEASE_TABLET | ORAL | Status: AC
  Administered 2014-10-01: 40 meq via ORAL
  Filled 2014-10-01: qty 2

## 2014-10-01 NOTE — Progress Notes (Addendum)
Inpatient Diabetes Program Recommendations  AACE/ADA: New Consensus Statement on Inpatient Glycemic Control (2013)  Target Ranges:  Prepandial:   less than 140 mg/dL      Peak postprandial:   less than 180 mg/dL (1-2 hours)      Critically ill patients:  140 - 180 mg/dL   Results for JOELY, LAHENS (MRN [id number]) as of 10/01/2014 14:16  Ref. Range 09/30/2014 21:53 10/01/2014 08:12 10/01/2014 12:10  Glucose-Capillary Latest Range: 70-99 mg/dL 190 (H) 210 (H) 269 (H)    Diabetes history: DM 2 Outpatient Diabetes medications: Glipizide 10 mg Daily, Metformin 1,000 mg BID Current orders for Inpatient glycemic control: Glipizide 10 mg Daily  A1c 9.5% on 3/12 poorly controlled at home. Not sure if Glipizide and Metformin regimen would be enough on discharge.  Inpatient Diabetes Program Recommendations Correction (SSI): Patient's glucose has increased without insulin from 210 to 269 mg/dl. Patient only ordered Glipizide here. Please resume Novolog 0-20 units (resistant scale) TID.  Thanks,  Tama Headings RN, MSN, [facility] Inpatient Diabetes Coordinator Team Pager [phone]

## 2014-10-01 NOTE — Progress Notes (Signed)
Physical Therapy Treatment Patient Details Name: Jodi Underwood MRN: [id number] DOB: [birth year] Today's Date: 10/01/2014    History of Present Illness Jodi Underwood is a 69 y.o. female with a history of Type II DM, Essential HTN, Asthma, Morbid Obesity, and Chronic Lymphedema of the BLEs who presents to the ED with complaints of fevers and chills and lower ABD pain and palpitations that started today. She was found to have a fever to 102.5 in the ED, and an initial lactate level of 4.27. A Sepsis work up was initiated and she was placed on IV Vancomycin and Zosyn. A UA was positive    PT Comments    Pt takes significant effort to get OOB even given assist due to edematous, stiff and painful legs.  Once moving, she does better with mobility, but still appears to need assist of equipment that she may not have access to at home.  She states that she and her daughters are working on intermittent assist to help her.  Pt is adamant about going straight home and dealing with available assist.   Follow Up Recommendations  Other (comment);Home health PT (pt chooses to go back home even though not optimal)     Equipment Recommendations  Other (comment) (TBA, not sure was assist pt will have)    Recommendations for Other Services       Precautions / Restrictions Precautions Precautions: Fall Restrictions Weight Bearing Restrictions: No    Mobility  Bed Mobility Overal bed mobility: Needs Assistance;+ 2 for safety/equipment Bed Mobility: Supine to Sit;Sit to Supine     Supine to sit: Mod assist Sit to supine: Mod assist   General bed mobility comments: With [facility] raised and a rail, pt does better than would be expected.  Needs scoot assist which is made easier by the pads.  But the hospital bed will not be at home.  Transfers Overall transfer level: Needs assistance Equipment used: Straight cane Transfers: Sit to/from [facility] Sit to Stand: Min  assist;Min guard Stand pivot transfers: Min guard;+2 safety/equipment       General transfer comment: Uses momentum and several rocking attempts to stand and effortfully pivots to the BSC,though does it without assist.  Ambulation/Gait             General Gait Details: not attempted.   Stairs            Wheelchair Mobility    Modified Rankin (Stroke Patients Only)       Balance Overall balance assessment: Needs assistance Sitting-balance support: No upper extremity supported Sitting balance-Leahy Scale: Good Sitting balance - Comments: EOB or BSC can w/shift, scoot and reach outside BOS   Standing balance support: Single extremity supported Standing balance-Leahy Scale: Fair Standing balance comment: stood brom bed holding to cane or to the [facility] armrest during pericare.  Fatigues standing statically after 20-25 secs.                    Cognition Arousal/Alertness: Awake/alert Behavior During Therapy: WFL for tasks assessed/performed Overall Cognitive Status: Within Functional Limits for tasks assessed                      Exercises      General Comments        Pertinent Vitals/Pain Pain Assessment: Faces Faces Pain Scale: Hurts little more Pain Location: legs Pain Descriptors / Indicators: Aching Pain Intervention(s): Monitored during session    Home Living  Prior Function            PT Goals (current goals can now be found in the care plan section) Acute Rehab PT Goals Patient Stated Goal: get home. PT Goal Formulation: With patient Time For Goal Achievement: 10/13/14 Potential to Achieve Goals: Good Progress towards PT goals: Progressing toward goals    Frequency       PT Plan Current plan remains appropriate    Co-evaluation             End of Session   Activity Tolerance: Patient tolerated treatment well Patient left: in bed;with call bell/phone within reach     Time:  1110-1150 PT Time Calculation (min) (ACUTE ONLY): 40 min  Charges:  $Therapeutic Activity: 38-52 mins                    G Codes:      Samani Deal, Tessie Fass 10/01/2014, 12:35 PM 10/01/2014  Donnella Sham, [place] [phone]  (pager)

## 2014-10-01 NOTE — Care Management Note (Addendum)
    Page 1 of 2   10/03/2014     9:01:43 AM CARE MANAGEMENT NOTE 10/03/2014  Patient:  Jodi Underwood,Jodi Underwood   Account Number:  [id number]  Date Initiated:  09/29/2014  Documentation initiated by:  Marylyn Ishihara  Subjective/Objective Assessment:   Admitted for Sepsis     Action/Plan:   Refused SNF, home with home health.   Anticipated DC Date:  10/02/2014   Anticipated DC Plan:  [place]  CM consult      [facility] Choice  HOME HEALTH   Choice offered to / List presented to:  C-1 Patient        [place] arranged  [place]   Status of service:  Completed, signed off Medicare Important Message given?  YES (If response is "NO", the following Medicare IM given date fields will be blank) Date Medicare IM given:  09/29/2014 Medicare IM given by:  Tomi Bamberger Date Additional Medicare IM given:  10/02/2014 Additional Medicare IM given by:  Tomi Bamberger  Discharge Disposition:  [place]  Per UR Regulation:  Reviewed for med. necessity/level of care/duration of stay  If discussed at [place] of Stay Meetings, dates discussed:    Comments:  10/03/14 [place] ,BSN [phone] patient chose [place] instead of [facility], NCM informed [facility] of this information, also referral was made to [place] for [place] and aide.  Per Stanton Kidney with [facility] states the OT rep will be able to do lyphadema leg wraps for patient. Soc will begin 24-48 hrs post dc.  10/01/14 1628 Tomi Bamberger RN, BSN [phone] NCM confirmed with Miranda with [facility] that they have patient as a referral , she said yes for hhpt and aide.  09/29/14  1850   CM spoke with patient in her room.  Patient agreed she only needs PT at home.  Discussed with patient if she has assistance at home, says her daughters Gilmore Laroche and Celene Squibb come to see her and where she lives, there is a neighbor that lives there and always  around all the time and will be assisting her and her children might arrange for the neighbor to assist her for pay.  Patient was given list of [place] and she chose [place].  CM paged Jeannetta Nap of [facility] at [phone] and left voicemail message that patient needs PT post discharge.  Cm paged Dr Alene Mires via [email or web address] for orders for Home Health, CM Consult and Face to face form completion, Doris, NP called back and says she will complete the orders but does not know patient and needs full report to do face to face. Doris will complete the [facility] Order and CM consult order, message left with Nurse for treating MD to complete face to face in the morning.  Marylyn Ishihara, RN, BSN, CCM

## 2014-10-01 NOTE — Progress Notes (Signed)
[place] TEAM 1 - Stepdown/ICU TEAM Progress Note  Marely Natali [id number] DOB: 1945-10-30 DOA: 09/25/2014 PCP: Antony Blackbird, MD  Admit HPI / Brief Narrative: 69 y.o. F Hx Type II DM, Essential HTN, Asthma, Morbid Obesity, and Chronic Lymphedema of the BLEs who presented to the ED with complaints of fevers and chills and lower ABD pain and palpitations that started the day of her admit. She was found to have a fever to 102.5 in the ED, and an initial lactate level of 4.27. A sepsis work up was initiated and she was placed on IV Vancomycin and Zosyn. A UA was positive.   HPI/Subjective: No complaints - stool is not watery.   Assessment/Plan:  Sepsis due to UTI/ pyelonephritis- K pneumoniae bacteremia -Urine culture not helpful in identifying the offending organism, but UA was very strongly + - abx started 3/11 - repeat UA on 3/15 looks like infection is clearing - one set bld cx  With K pneumoniae- sensitive to Rocephin- repeat cultures to document clearing- will need 2 wks of antibiotics-   Staph bacteremia? - 1/4 blood cx + Is coag negative stap and therefore a contaminant  Acute on chronic renal failure/Diastolic CHF ? Vs fluid overload -Discontinue Ibesartan -follow w/ diuresis as pt appear volume overloaded on exam - weight is up by 16 lbs - unable to follow I and O as she is incontinent in the bed- follow daily weights and renal function -Echocardiogram-TTE- reports normal systolic fxn - no mention of diastolic failure but has LA dilatation which is highly suggestive of diastolic CHF -admission weight= 172 kg - now 186kg despite being given Lasix- I and O inaccurate as she is incontinent most of the time -  placed foley today to follow I and O as weight also appears inaccurate- she is laying in urine all of the time due to her incontinence - Cr , weight and I and O show improvement today  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 - likely from morbid obesity, diastolic  chf - likely has sleep apnea as well  DM type 2, uncontrolled, with neuropathy -A1c 9.5 -CBG very poorly controlled - oddly she states sugars were very controlled at home on Glipizide and Metformin but we have been holding both and giving insulin and sugars are quite high - resumed Glipizide on 3/15 with significant improvement in sugars - suspect Insluin is not being absorbed (due to her obesity?)- - will need to hold Metformin due to renal failure - cont insulin  Essential hypertension -poorly controlled - follow w/ diuresis   Lymphedema -Consulted WOC/PT/OT for lymphedema wraps/unna boots - they are recommending we wait until fluid overload resolves prior to placing the wraps  Acute respiratory distress -Patient had acute asthma attack+ flash pulmonary edema from fluid overload -continue attempts at diuresis   Transaminitis  Fatty liver? - mild elevation persists  Asthma Albuterol Nebs PRN  Morbid obesity - Body mass index is 62.98 kg/(m^2).   Code Status: FULL Family Communication: no family present at time of exam Disposition Plan: declines SNF - will go back home with HHPT- wheelchair at home  Consultants: NA  Procedure/Significant Events: 3/12 Acute abdominal series; negative 3/13 echocardiogram;Left ventricle:  moderatefocal basal and moderate concentric hypertrophy of septum. -Systolic function was normal.  - Left atrium: severely dilated. -- Right atrium:moderately dilated. - Pulmonary PA peak pressure: 38 mm Hg (S).  Antibiotics: Anti-infectives    Start     Dose/Rate Route Frequency Ordered Stop   10/01/14 1700  cefTRIAXone (ROCEPHIN) 2 g in dextrose 5 % 50 mL IVPB - Premix     2 g 100 mL/hr over 30 Minutes Intravenous Every 24 hours 09/30/14 1659     09/28/14 1700  cefTRIAXone (ROCEPHIN) 1 g in dextrose 5 % 50 mL IVPB - Premix  Status:  Discontinued     1 g 100 mL/hr over 30 Minutes Intravenous Every 24 hours 09/28/14 1432  09/30/14 1659   09/26/14 2230  vancomycin (VANCOCIN) 1,750 mg in sodium chloride 0.9 % 500 mL IVPB  Status:  Discontinued     1,750 mg 250 mL/hr over 120 Minutes Intravenous Every 24 hours 09/26/14 0202 09/29/14 1623   09/26/14 0330  piperacillin-tazobactam (ZOSYN) IVPB 3.375 g  Status:  Discontinued     3.375 g 12.5 mL/hr over 240 Minutes Intravenous Every 8 hours 09/25/14 2129 09/28/14 1432   09/25/14 2130  piperacillin-tazobactam (ZOSYN) IVPB 3.375 g     3.375 g 100 mL/hr over 30 Minutes Intravenous  Once 09/25/14 2119 09/25/14 2233   09/25/14 2130  vancomycin (VANCOCIN) IVPB 1000 mg/200 mL premix  Status:  Discontinued     1,000 mg 200 mL/hr over 60 Minutes Intravenous  Once 09/25/14 2119 09/25/14 2125   09/25/14 2130  vancomycin (VANCOCIN) 2,500 mg in sodium chloride 0.9 % 500 mL IVPB     2,500 mg 250 mL/hr over 120 Minutes Intravenous  Once 09/25/14 2125 09/26/14 0107      DVT prophylaxis: Lovenox  Objective: Blood pressure 140/61, pulse 89, temperature 98.5 F (36.9 C), temperature source Oral, resp. rate 16, height 5\' 6"  (1.676 m), weight 176.903 kg (390 lb), SpO2 91 %.  Intake/Output Summary (Last 24 hours) at 10/01/14 1450 Last data filed at 10/01/14 1412  Gross per 24 hour  Intake    360 ml  Output  10925 ml  Net -10565 ml   Exam: General: no acute resp distress - alert and conversant  Lungs: distant breath sounds Cardiovascular:  Distant HS - RRR w/o appreciable M  Abdomen: Nontender, morbidly obese, soft, bowel sounds positive, no rebound Extremities: Bilateral LE severe lymphedema w/ cutaneous change of chronic venous stasis   Data Reviewed: Basic Metabolic Panel:  Recent Labs Lab 09/27/14 0358 09/28/14 0334 09/29/14 0554 09/30/14 0607 10/01/14 0633  NA 138 135 135 139 138  K 3.5 4.1 4.2 3.4* 3.0*  CL 107 104 102 101 94*  CO2 23 19 25 29 29   GLUCOSE 223* 382* 354* 137* 236*  BUN 15 20 32* 30* 27*  CREATININE 2.10* 2.61* 2.69* 2.60* 2.36*  CALCIUM  7.7* 7.7* 7.8* 8.1* 8.0*  MG 1.2* 1.6  --   --   --    Liver Function Tests:  Recent Labs Lab 09/25/14 2131 09/27/14 0358 09/28/14 0334 09/29/14 0554  AST 20 20 57* 40*  ALT 15 15 40* 54*  ALKPHOS 74 76 177* 167*  BILITOT 1.2 1.2 1.5* 0.4  PROT 6.5 5.6* 6.2 6.1  ALBUMIN 3.2* 2.4* 2.6* 2.5*    Recent Labs Lab 09/27/14 0358  LIPASE 16   CBC:  Recent Labs Lab 09/25/14 2131  09/27/14 0358 09/28/14 0334 09/29/14 0554 09/30/14 0607 10/01/14 0633  WBC 14.7*  < > 14.4* 17.2* 15.8* 15.0* 13.3*  NEUTROABS 13.5*  --  11.4* 15.7*  --   --   --   HGB 12.4  < > 10.0* 10.8* 10.4* 10.9* 11.3*  HCT 40.0  < > 32.7* 34.6* 33.4* 35.1* 35.4*  MCV 84.6  < > 83.8  85.6 82.5 82.6 82.5  PLT 297  < > 209 198 261 303 294  < > = values in this interval not displayed. CBG:  Recent Labs Lab 09/30/14 1210 09/30/14 1716 09/30/14 2153 10/01/14 0812 10/01/14 1210  GLUCAP 192* 193* 190* 210* 269*    Recent Results (from the past 240 hour(s))  Blood Culture (routine x 2)     Status: None   Collection Time: 09/25/14  9:43 PM  Result Value Ref Range Status   Specimen Description BLOOD ARM RIGHT  Final   Special Requests BOTTLES DRAWN AEROBIC AND ANAEROBIC 10CC  Final   Culture   Final    KLEBSIELLA PNEUMONIAE Note: Gram Stain Report Called to,Read Back By and Verified With: Minna Merritts [facility] 09/29/14 0950 BY SMITHERSJ Performed at [facility]    Report Status 10/01/2014 FINAL  Final   Organism ID, Bacteria KLEBSIELLA PNEUMONIAE  Final      Susceptibility   Klebsiella pneumoniae - MIC*    AMPICILLIN >=32 RESISTANT Resistant     AMPICILLIN/SULBACTAM >=32 RESISTANT Resistant     CEFAZOLIN <=4 SENSITIVE Sensitive     CEFEPIME <=1 SENSITIVE Sensitive     CEFTAZIDIME <=1 SENSITIVE Sensitive     CEFTRIAXONE <=1 SENSITIVE Sensitive     CIPROFLOXACIN <=0.25 SENSITIVE Sensitive     GENTAMICIN <=1 SENSITIVE Sensitive     IMIPENEM <=0.25 SENSITIVE Sensitive     PIP/TAZO 8 SENSITIVE  Sensitive     TOBRAMYCIN <=1 SENSITIVE Sensitive     TRIMETH/SULFA <=20 SENSITIVE Sensitive     * KLEBSIELLA PNEUMONIAE  Blood Culture (routine x 2)     Status: None   Collection Time: 09/25/14  9:58 PM  Result Value Ref Range Status   Specimen Description BLOOD HAND RIGHT  Final   Special Requests BOTTLES DRAWN AEROBIC AND ANAEROBIC 5CC  Final   Culture   Final    STAPHYLOCOCCUS SPECIES (COAGULASE NEGATIVE) Note: THE SIGNIFICANCE OF ISOLATING THIS ORGANISM FROM A SINGLE SET OF BLOOD CULTURES WHEN MULTIPLE SETS ARE DRAWN IS UNCERTAIN. PLEASE NOTIFY THE MICROBIOLOGY DEPARTMENT WITHIN ONE WEEK IF SPECIATION AND SENSITIVITIES ARE REQUIRED. Note: Gram Stain Report Called to,Read Back By and Verified With: Corie Chiquito RN @1105PM  Thelma Comp [id number] Performed at [facility]    Report Status 09/29/2014 FINAL  Final  Urine culture     Status: None   Collection Time: 09/25/14 10:20 PM  Result Value Ref Range Status   Specimen Description URINE, RANDOM  Final   Special Requests NONE  Final   Colony Count NO GROWTH Performed at [facility]   Final   Culture NO GROWTH Performed at [facility]   Final   Report Status 09/27/2014 FINAL  Final  MRSA PCR Screening     Status: None   Collection Time: 09/26/14  5:20 AM  Result Value Ref Range Status   MRSA by PCR NEGATIVE NEGATIVE Final    Comment:        The GeneXpert MRSA Assay (FDA approved for NASAL specimens only), is one component of a comprehensive MRSA colonization surveillance program. It is not intended to diagnose MRSA infection nor to guide or monitor treatment for MRSA infections.   Culture, blood (routine x 2)     Status: None (Preliminary result)   Collection Time: 09/26/14  3:30 PM  Result Value Ref Range Status   Specimen Description BLOOD RIGHT ASSIST CONTROL  Final   Special Requests   Final    BOTTLES DRAWN AEROBIC  AND ANAEROBIC 2CC BLUE 5CC RED   Culture   Final           BLOOD CULTURE RECEIVED NO  GROWTH TO DATE CULTURE WILL BE HELD FOR 5 DAYS BEFORE ISSUING A FINAL NEGATIVE REPORT Performed at [facility]    Report Status PENDING  Incomplete  Culture, blood (routine x 2)     Status: None (Preliminary result)   Collection Time: 09/26/14  3:42 PM  Result Value Ref Range Status   Specimen Description BLOOD RIGHT HAND  Final   Special Requests BOTTLES DRAWN AEROBIC ONLY [place]  Final   Culture   Final           BLOOD CULTURE RECEIVED NO GROWTH TO DATE CULTURE WILL BE HELD FOR 5 DAYS BEFORE ISSUING A FINAL NEGATIVE REPORT Performed at [facility]    Report Status PENDING  Incomplete  Clostridium Difficile by PCR     Status: None   Collection Time: 09/30/14 11:47 PM  Result Value Ref Range Status   C difficile by pcr NEGATIVE NEGATIVE Final     Studies:  Recent x-ray studies have been reviewed in detail by the Attending Physician  Scheduled Meds:  Scheduled Meds: . cefTRIAXone (ROCEPHIN)  IV  2 g Intravenous Q24H  . enoxaparin (LOVENOX) injection  40 mg Subcutaneous Q24H  . furosemide  80 mg Intravenous Q12H  . glipiZIDE  10 mg Oral Q breakfast  . hydrocerin  1 application Topical BID  . metoprolol tartrate  25 mg Oral BID  . potassium chloride  40 mEq Oral Daily  . potassium chloride  40 mEq Oral Q4H    Time spent on care of this patient: 35 mins  Debbe Odea, MD [facility] For Consults/Admissions - Flow Manager [phone] Office  [phone]  Contact MD directly via text page:      [email or web address]      password [facility]  10/01/2014, 2:50 PM   LOS: 5 days

## 2014-10-02 LAB — BASIC METABOLIC PANEL
Anion gap: 10 (ref 5–15)
BUN: 23 mg/dL (ref 6–23)
CO2: 37 mmol/L — ABNORMAL HIGH (ref 19–32)
Calcium: 8.2 mg/dL — ABNORMAL LOW (ref 8.4–10.5)
Chloride: 91 mmol/L — ABNORMAL LOW (ref 96–112)
Creatinine, Ser: 2.36 mg/dL — ABNORMAL HIGH (ref 0.50–1.10)
GFR, EST AFRICAN AMERICAN: 23 mL/min — AB (ref >90)
GFR, EST NON AFRICAN AMERICAN: 20 mL/min — AB (ref >90)
GLUCOSE: 229 mg/dL — AB (ref 70–99)
POTASSIUM: 3.2 mmol/L — AB (ref 3.5–5.1)
SODIUM: 138 mmol/L (ref 135–145)

## 2014-10-02 LAB — CULTURE, BLOOD (ROUTINE X 2): Culture: NO GROWTH

## 2014-10-02 LAB — GLUCOSE, CAPILLARY
GLUCOSE-CAPILLARY: 242 mg/dL — AB (ref 70–99)
Glucose-Capillary: 265 mg/dL — ABNORMAL HIGH (ref 70–99)
Glucose-Capillary: 226 mg/dL — ABNORMAL HIGH (ref 70–99)
Glucose-Capillary: 232 mg/dL — ABNORMAL HIGH (ref 70–99)

## 2014-10-02 LAB — CBC
HCT: 38.8 % (ref 36.0–46.0)
HEMOGLOBIN: 12 g/dL (ref 12.0–15.0)
MCH: 25.8 pg — ABNORMAL LOW (ref 26.0–34.0)
MCHC: 30.9 g/dL (ref 30.0–36.0)
MCV: 83.4 fL (ref 78.0–100.0)
Platelets: 327 10*3/uL (ref 150–400)
RBC: 4.65 MIL/uL (ref 3.87–5.11)
RDW: 13.2 % (ref 11.5–15.5)
WBC: 13.9 10*3/uL — AB (ref 4.0–10.5)

## 2014-10-02 MED ORDER — INSULIN ASPART 100 UNIT/ML ~~LOC~~ SOLN
4.0000 [IU] | Freq: Three times a day (TID) | SUBCUTANEOUS | Status: DC
  Administered 2014-10-02 – 2014-10-04 (×6): 4 [IU] via SUBCUTANEOUS

## 2014-10-02 MED ORDER — POTASSIUM CHLORIDE CRYS ER 20 MEQ PO TBCR
40.0000 meq | EXTENDED_RELEASE_TABLET | Freq: Three times a day (TID) | ORAL | Status: DC
  Administered 2014-10-02 – 2014-10-03 (×3): 40 meq via ORAL
  Filled 2014-10-02 (×5): qty 2

## 2014-10-02 NOTE — Progress Notes (Signed)
PT Lymphedema Note Continuing to monitor patient for appropriateness of lymphedema wrapping.  I am still concerned about the amount of fluid overload and do not want to overtax kidneys.  Labs values still high, will continue to hold off. I was able to find a therapist in the home health community that can do lymphedema wrapping.  [facility] offers this service.  Spoke with CM and informed of this.  Will continue to monitor. THank you, 10/02/2014 Jodi Underwood, [place]

## 2014-10-02 NOTE — Progress Notes (Signed)
[place] TEAM 1 - Stepdown/ICU TEAM Progress Note  Jodi Underwood [id number] DOB: 05-Jan-1946 DOA: 09/25/2014 PCP: Antony Blackbird, MD  Admit HPI / Brief Narrative: 69 y.o. F Hx Type II DM, Essential HTN, Asthma, Morbid Obesity, and Chronic Lymphedema of the BLEs who presented to the ED with complaints of fevers and chills and lower ABD pain and palpitations that started the day of her admit. She was found to have a fever to 102.5 in the ED, and an initial lactate level of 4.27. A sepsis work up was initiated and she was placed on IV Vancomycin and Zosyn. A UA was positive.   HPI/Subjective: States she is feeling much better today. Strength is good and so is PO intake. She is happy with the decreased edema in her legs.   Assessment/Plan:  Sepsis due to UTI/ pyelonephritis- K pneumoniae bacteremia -Urine culture not helpful in identifying the offending organism, but UA was very strongly + - abx started 3/11 - repeat UA on 3/15 looks like infection is clearing - one set bld cx  With K pneumoniae- sensitive to Rocephin- repeating cultures to document clearing- will need 2 wks of antibiotics-   Staph bacteremia? - 1/4 blood cx + Is coag negative stap and therefore a contaminant  Acute on chronic renal failure/Diastolic CHF ? Vs fluid overload -Discontinue Ibesartan -follow w/ diuresis as pt appear volume overloaded on exam - weight is up by 16 lbs - unable to follow I and O as she is incontinent in the bed- follow daily weights and renal function -Echocardiogram-TTE- reports normal systolic fxn - no mention of diastolic failure but has LA dilatation which is highly suggestive of diastolic CHF - I and O inaccurate as she is incontinent most of the time -  placed foley t to follow I and O as weight also appears inaccurate- she is laying in urine all of the time due to her incontinence - Cr , weight and I and O have been showing slow improvement for the past few days-  cont to diurese  Hypokalemia - due to Lasix- cont to replace  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 - likely from morbid obesity, diastolic chf - likely has sleep apnea as well  DM type 2, uncontrolled, with neuropathy -A1c 9.5 -CBG very poorly controlled - oddly she states sugars were very controlled at home on Glipizide and Metformin but we have been holding both and giving insulin and sugars are quite high - resumed Glipizide on 3/15 with significant improvement in sugars - suspect Insluin is not being absorbed (due to her obesity?)- - will need to hold Metformin due to renal failure - cont insulin  Essential hypertension -poorly controlled - follow w/ diuresis   Lymphedema?- possibly venous stasis  -Consulted WOC/PT/OT for lymphedema wraps/unna boots - they are recommending we wait until fluid overload resolves prior to placing the wraps- it appears now that she may not needed them as edema has resolved.  Acute respiratory distress -Patient had acute asthma attack+ flash pulmonary edema from fluid overload -continue attempts at diuresis   Transaminitis  Fatty liver? - mild elevation persists  Asthma Albuterol Nebs PRN  Morbid obesity - Body mass index is 62.98 kg/(m^2).   Code Status: FULL Family Communication: no family present at time of exam Disposition Plan: declines SNF - will go back home with HHPT- wheelchair at home  Consultants: NA  Procedure/Significant Events: 3/12 Acute abdominal series; negative 3/13 echocardiogram;Left ventricle:  moderatefocal basal and moderate concentric hypertrophy of septum. -  Systolic function was normal.  - Left atrium: severely dilated. -- Right atrium:moderately dilated. - Pulmonary PA peak pressure: 38 mm Hg (S).  Antibiotics: Anti-infectives    Start     Dose/Rate Route Frequency Ordered Stop   10/01/14 1700  cefTRIAXone (ROCEPHIN) 2 g in dextrose 5 % 50 mL IVPB - Premix     2 g 100 mL/hr over 30 Minutes  Intravenous Every 24 hours 09/30/14 1659     09/28/14 1700  cefTRIAXone (ROCEPHIN) 1 g in dextrose 5 % 50 mL IVPB - Premix  Status:  Discontinued     1 g 100 mL/hr over 30 Minutes Intravenous Every 24 hours 09/28/14 1432 09/30/14 1659   09/26/14 2230  vancomycin (VANCOCIN) 1,750 mg in sodium chloride 0.9 % 500 mL IVPB  Status:  Discontinued     1,750 mg 250 mL/hr over 120 Minutes Intravenous Every 24 hours 09/26/14 0202 09/29/14 1623   09/26/14 0330  piperacillin-tazobactam (ZOSYN) IVPB 3.375 g  Status:  Discontinued     3.375 g 12.5 mL/hr over 240 Minutes Intravenous Every 8 hours 09/25/14 2129 09/28/14 1432   09/25/14 2130  piperacillin-tazobactam (ZOSYN) IVPB 3.375 g     3.375 g 100 mL/hr over 30 Minutes Intravenous  Once 09/25/14 2119 09/25/14 2233   09/25/14 2130  vancomycin (VANCOCIN) IVPB 1000 mg/200 mL premix  Status:  Discontinued     1,000 mg 200 mL/hr over 60 Minutes Intravenous  Once 09/25/14 2119 09/25/14 2125   09/25/14 2130  vancomycin (VANCOCIN) 2,500 mg in sodium chloride 0.9 % 500 mL IVPB     2,500 mg 250 mL/hr over 120 Minutes Intravenous  Once 09/25/14 2125 09/26/14 0107      DVT prophylaxis: Lovenox  Objective: Blood pressure 159/54, pulse 91, temperature 98.6 F (37 C), temperature source Oral, resp. rate 18, height 5\' 6"  (1.676 m), weight 176.903 kg (390 lb), SpO2 100 %.  Intake/Output Summary (Last 24 hours) at 10/02/14 1234 Last data filed at 10/02/14 1210  Gross per 24 hour  Intake    770 ml  Output   7550 ml  Net  -6780 ml   Exam: General: no acute resp distress - alert and conversant  Lungs: distant breath sounds Cardiovascular:  Distant HS - RRR w/o appreciable M  Abdomen: Nontender, morbidly obese, soft, bowel sounds positive, no rebound Extremities: no further pedal edema  Data Reviewed: Basic Metabolic Panel:  Recent Labs Lab 09/27/14 0358 09/28/14 0334 09/29/14 0554 09/30/14 0607 10/01/14 0633 10/02/14 0613  NA 138 135 135 139 138  138  K 3.5 4.1 4.2 3.4* 3.0* 3.2*  CL 107 104 102 101 94* 91*  CO2 23 19 25 29 29  37*  GLUCOSE 223* 382* 354* 137* 236* 229*  BUN 15 20 32* 30* 27* 23  CREATININE 2.10* 2.61* 2.69* 2.60* 2.36* 2.36*  CALCIUM 7.7* 7.7* 7.8* 8.1* 8.0* 8.2*  MG 1.2* 1.6  --   --   --   --    Liver Function Tests:  Recent Labs Lab 09/25/14 2131 09/27/14 0358 09/28/14 0334 09/29/14 0554  AST 20 20 57* 40*  ALT 15 15 40* 54*  ALKPHOS 74 76 177* 167*  BILITOT 1.2 1.2 1.5* 0.4  PROT 6.5 5.6* 6.2 6.1  ALBUMIN 3.2* 2.4* 2.6* 2.5*    Recent Labs Lab 09/27/14 0358  LIPASE 16   CBC:  Recent Labs Lab 09/25/14 2131  09/27/14 0358 09/28/14 0334 09/29/14 0554 09/30/14 0607 10/01/14 0633 10/02/14 0613  WBC 14.7*  < >  14.4* 17.2* 15.8* 15.0* 13.3* 13.9*  NEUTROABS 13.5*  --  11.4* 15.7*  --   --   --   --   HGB 12.4  < > 10.0* 10.8* 10.4* 10.9* 11.3* 12.0  HCT 40.0  < > 32.7* 34.6* 33.4* 35.1* 35.4* 38.8  MCV 84.6  < > 83.8 85.6 82.5 82.6 82.5 83.4  PLT 297  < > 209 198 261 303 294 327  < > = values in this interval not displayed. CBG:  Recent Labs Lab 10/01/14 1210 10/01/14 1726 10/01/14 2155 10/02/14 0804 10/02/14 1207  GLUCAP 269* 318* 274* 242* 265*    Recent Results (from the past 240 hour(s))  Blood Culture (routine x 2)     Status: None   Collection Time: 09/25/14  9:43 PM  Result Value Ref Range Status   Specimen Description BLOOD ARM RIGHT  Final   Special Requests BOTTLES DRAWN AEROBIC AND ANAEROBIC 10CC  Final   Culture   Final    KLEBSIELLA PNEUMONIAE Note: Gram Stain Report Called to,Read Back By and Verified With: Minna Merritts [facility] 09/29/14 0950 BY SMITHERSJ Performed at [facility]    Report Status 10/01/2014 FINAL  Final   Organism ID, Bacteria KLEBSIELLA PNEUMONIAE  Final      Susceptibility   Klebsiella pneumoniae - MIC*    AMPICILLIN >=32 RESISTANT Resistant     AMPICILLIN/SULBACTAM >=32 RESISTANT Resistant     CEFAZOLIN <=4 SENSITIVE Sensitive      CEFEPIME <=1 SENSITIVE Sensitive     CEFTAZIDIME <=1 SENSITIVE Sensitive     CEFTRIAXONE <=1 SENSITIVE Sensitive     CIPROFLOXACIN <=0.25 SENSITIVE Sensitive     GENTAMICIN <=1 SENSITIVE Sensitive     IMIPENEM <=0.25 SENSITIVE Sensitive     PIP/TAZO 8 SENSITIVE Sensitive     TOBRAMYCIN <=1 SENSITIVE Sensitive     TRIMETH/SULFA <=20 SENSITIVE Sensitive     * KLEBSIELLA PNEUMONIAE  Blood Culture (routine x 2)     Status: None   Collection Time: 09/25/14  9:58 PM  Result Value Ref Range Status   Specimen Description BLOOD HAND RIGHT  Final   Special Requests BOTTLES DRAWN AEROBIC AND ANAEROBIC 5CC  Final   Culture   Final    STAPHYLOCOCCUS SPECIES (COAGULASE NEGATIVE) Note: THE SIGNIFICANCE OF ISOLATING THIS ORGANISM FROM A SINGLE SET OF BLOOD CULTURES WHEN MULTIPLE SETS ARE DRAWN IS UNCERTAIN. PLEASE NOTIFY THE MICROBIOLOGY DEPARTMENT WITHIN ONE WEEK IF SPECIATION AND SENSITIVITIES ARE REQUIRED. Note: Gram Stain Report Called to,Read Back By and Verified With: Corie Chiquito RN @1105PM  Thelma Comp [id number] Performed at [facility]    Report Status 09/29/2014 FINAL  Final  Urine culture     Status: None   Collection Time: 09/25/14 10:20 PM  Result Value Ref Range Status   Specimen Description URINE, RANDOM  Final   Special Requests NONE  Final   Colony Count NO GROWTH Performed at [facility]   Final   Culture NO GROWTH Performed at [facility]   Final   Report Status 09/27/2014 FINAL  Final  MRSA PCR Screening     Status: None   Collection Time: 09/26/14  5:20 AM  Result Value Ref Range Status   MRSA by PCR NEGATIVE NEGATIVE Final    Comment:        The GeneXpert MRSA Assay (FDA approved for NASAL specimens only), is one component of a comprehensive MRSA colonization surveillance program. It is not intended to diagnose MRSA infection nor to  guide or monitor treatment for MRSA infections.   Culture, blood (routine x 2)     Status: None (Preliminary result)     Collection Time: 09/26/14  3:30 PM  Result Value Ref Range Status   Specimen Description BLOOD RIGHT ASSIST CONTROL  Final   Special Requests   Final    BOTTLES DRAWN AEROBIC AND ANAEROBIC 2CC BLUE 5CC RED   Culture   Final    GRAM NEGATIVE RODS Note: Gram Stain Report Called to,Read Back By and Verified With: Justine Null [id number]  BJENN Performed at [facility]    Report Status PENDING  Incomplete  Culture, blood (routine x 2)     Status: None   Collection Time: 09/26/14  3:42 PM  Result Value Ref Range Status   Specimen Description BLOOD RIGHT HAND  Final   Special Requests BOTTLES DRAWN AEROBIC ONLY [place]  Final   Culture   Final    NO GROWTH 5 DAYS Performed at [facility]    Report Status 10/02/2014 FINAL  Final  Clostridium Difficile by PCR     Status: None   Collection Time: 09/30/14 11:47 PM  Result Value Ref Range Status   C difficile by pcr NEGATIVE NEGATIVE Final     Studies:  Recent x-ray studies have been reviewed in detail by the Attending Physician  Scheduled Meds:  Scheduled Meds: . cefTRIAXone (ROCEPHIN)  IV  2 g Intravenous Q24H  . enoxaparin (LOVENOX) injection  40 mg Subcutaneous Q24H  . furosemide  80 mg Intravenous Q12H  . glipiZIDE  10 mg Oral Q breakfast  . hydrocerin  1 application Topical BID  . insulin aspart  0-15 Units Subcutaneous TID WC  . insulin aspart  0-5 Units Subcutaneous QHS  . insulin detemir  10 Units Subcutaneous QHS  . metoprolol tartrate  25 mg Oral BID  . potassium chloride  40 mEq Oral Daily    Time spent on care of this patient: 35 mins  Debbe Odea, MD [facility] For Consults/Admissions - Flow Manager - [phone] Office  [phone]  Contact MD directly via text page:      [email or web address]      password [facility]  10/02/2014, 12:34 PM   LOS: 6 days

## 2014-10-02 NOTE — Progress Notes (Signed)
MD notified that patient's DBP was 45 and 25mg  lopressor is scheduled.  MD stated to hold medication for tonight.  Will continue to monitor patient.

## 2014-10-02 NOTE — Progress Notes (Signed)
NCM spoke with patient , she states she has co pay of $3 with her medicaid and she can not afford $3.  NCM asked if there was someone she could borrow the money from, she said no, NCM informed the MD.

## 2014-10-02 NOTE — Progress Notes (Signed)
Medicare Important Message given?  YES (If response is "NO", the following Medicare IM given date fields will be blank) Date Medicare IM given:  10/02/14 Medicare IM given by:  Sabriah Hobbins 

## 2014-10-02 NOTE — Progress Notes (Signed)
Verdis Frederickson from lab called about patient's anaerobic blood cultures showing gram negative rods. Patient is already on IV rocephin. On-call clinician text-paged. No new orders.

## 2014-10-03 LAB — CBC
HCT: 41.3 % (ref 36.0–46.0)
Hemoglobin: 12.6 g/dL (ref 12.0–15.0)
MCH: 26.1 pg (ref 26.0–34.0)
MCHC: 30.5 g/dL (ref 30.0–36.0)
MCV: 85.5 fL (ref 78.0–100.0)
PLATELETS: 374 10*3/uL (ref 150–400)
RBC: 4.83 MIL/uL (ref 3.87–5.11)
RDW: 13.2 % (ref 11.5–15.5)
WBC: 12.3 10*3/uL — ABNORMAL HIGH (ref 4.0–10.5)

## 2014-10-03 LAB — GLUCOSE, CAPILLARY
GLUCOSE-CAPILLARY: 275 mg/dL — AB (ref 70–99)
GLUCOSE-CAPILLARY: 279 mg/dL — AB (ref 70–99)
GLUCOSE-CAPILLARY: 200 mg/dL — AB (ref 70–99)
GLUCOSE-CAPILLARY: 214 mg/dL — AB (ref 70–99)
Glucose-Capillary: 301 mg/dL — ABNORMAL HIGH (ref 70–99)

## 2014-10-03 LAB — BASIC METABOLIC PANEL
Anion gap: 14 (ref 5–15)
BUN: 23 mg/dL (ref 6–23)
CO2: 31 mmol/L (ref 19–32)
Calcium: 8.2 mg/dL — ABNORMAL LOW (ref 8.4–10.5)
Chloride: 91 mmol/L — ABNORMAL LOW (ref 96–112)
Creatinine, Ser: 2.5 mg/dL — ABNORMAL HIGH (ref 0.50–1.10)
GFR calc Af Amer: 21 mL/min — ABNORMAL LOW (ref >90)
GFR, EST NON AFRICAN AMERICAN: 19 mL/min — AB (ref >90)
Glucose, Bld: 302 mg/dL — ABNORMAL HIGH (ref 70–99)
Potassium: 3.6 mmol/L (ref 3.5–5.1)
SODIUM: 136 mmol/L (ref 135–145)

## 2014-10-03 LAB — CULTURE, BLOOD (ROUTINE X 2)

## 2014-10-03 MED ORDER — FUROSEMIDE 10 MG/ML IJ SOLN
40.0000 mg | Freq: Two times a day (BID) | INTRAMUSCULAR | Status: DC
  Administered 2014-10-03 – 2014-10-04 (×2): 40 mg via INTRAVENOUS
  Filled 2014-10-03 (×3): qty 4

## 2014-10-03 MED ORDER — INSULIN DETEMIR 100 UNIT/ML ~~LOC~~ SOLN
20.0000 [IU] | Freq: Every day | SUBCUTANEOUS | Status: DC
  Administered 2014-10-03: 20 [IU] via SUBCUTANEOUS
  Filled 2014-10-03 (×2): qty 0.2

## 2014-10-03 MED ORDER — POTASSIUM CHLORIDE CRYS ER 20 MEQ PO TBCR
40.0000 meq | EXTENDED_RELEASE_TABLET | Freq: Two times a day (BID) | ORAL | Status: DC
  Administered 2014-10-03 – 2014-10-04 (×2): 40 meq via ORAL
  Filled 2014-10-03 (×2): qty 2

## 2014-10-03 NOTE — Progress Notes (Signed)
[facility] TEAM 1 - Stepdown/ICU TEAM Progress Note  Jodi Underwood [id number] DOB: [birth year] DOA: 09/25/2014 PCP: Antony Blackbird, MD  Admit HPI / Brief Narrative: 69 y.o. F Hx Type II DM, Essential HTN, Asthma, Morbid Obesity, and Chronic Lymphedema of the BLEs who presented to the ED with complaints of fevers and chills and lower ABD pain and palpitations that started the day of her admit. She was found to have a fever to 102.5 in the ED, and an initial lactate level of 4.27. A sepsis work up was initiated and she was placed on IV Vancomycin and Zosyn. A UA was positive.   HPI/Subjective: Denies shortness of breath, denies any fever or chills. Feels better, lower extremity swelling is much better.  Assessment/Plan:  Sepsis due to UTI/ pyelonephritis- K pneumoniae bacteremia -Urine culture not helpful in identifying the offending organism, but UA was very strongly + - abx started 3/11 - repeat UA on 3/15 looks like infection is clearing - Patient is on Rocephin, continue current regimen.  Klebsiella pneumoniae bacteremia Patient is on Rocephin, likely to be discharged on Ceftin to complete total 14 days of antibiotics. Has had 1/6 bottles positive for coagulase-negative staph, likely contaminant.  Acute on chronic renal failure/Diastolic CHF ? Vs fluid overload -Discontinue Ibesartan -Echocardiogram-TTE- reports normal systolic fxn - no mention of diastolic failure but has LA dilatation which is highly suggestive of diastolic CHF -Weight using standing standard scale is down from 380 pounds in admission to 355 pounds today. -Decrease Lasix dose to 40 twice a day today.  Hypokalemia -Secondary to diuresis, replace with oral supplements, add one dose today. Check BMP in a.m.  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  Essential hypertension -poorly controlled - follow w/ diuresis   Lymphedema?- possibly venous stasis  -Consulted [facility]/PT/OT for lymphedema wraps/unna boots - they are recommending we wait until fluid overload resolves prior to placing the wraps- it appears now that she may not needed them as edema has resolved.  Acute respiratory distress -Patient had acute asthma attack+ flash pulmonary edema from fluid overload -continue attempts at diuresis   Transaminitis  Check CMP in a.m.  Asthma Albuterol Nebs PRN  Morbid obesity - Body mass index is 57.41 kg/(m^2).   Code Status: FULL Family Communication: no family present at time of exam Disposition Plan: declines SNF - will go back home with HHPT- wheelchair at home  Consultants: NA  Procedure/Significant Events: 3/12 Acute abdominal series; negative 3/13 echocardiogram;Left ventricle:  moderatefocal basal and moderate concentric hypertrophy of septum. -Systolic function was normal.  - Left atrium: severely dilated. -- Right atrium:moderately dilated. - Pulmonary PA peak pressure: 38 mm Hg (S).  Antibiotics: Anti-infectives    Start     Dose/Rate Route Frequency Ordered Stop   10/01/14 1700  cefTRIAXone (ROCEPHIN) 2 g in dextrose 5 % 50 mL IVPB - Premix     2 g 100 mL/hr over 30 Minutes Intravenous Every 24 hours 09/30/14 1659     09/28/14 1700  cefTRIAXone (ROCEPHIN) 1 g in dextrose 5 % 50 mL IVPB - Premix  Status:  Discontinued  1 g 100 mL/hr over 30 Minutes Intravenous Every 24 hours 09/28/14 1432 09/30/14 1659   09/26/14 2230  vancomycin (VANCOCIN) 1,750 mg in sodium chloride 0.9 % 500 mL IVPB  Status:  Discontinued     1,750 mg 250 mL/hr over 120 Minutes Intravenous  Every 24 hours 09/26/14 0202 09/29/14 1623   09/26/14 0330  piperacillin-tazobactam (ZOSYN) IVPB 3.375 g  Status:  Discontinued     3.375 g 12.5 mL/hr over 240 Minutes Intravenous Every 8 hours 09/25/14 2129 09/28/14 1432   09/25/14 2130  piperacillin-tazobactam (ZOSYN) IVPB 3.375 g     3.375 g 100 mL/hr over 30 Minutes Intravenous  Once 09/25/14 2119 09/25/14 2233   09/25/14 2130  vancomycin (VANCOCIN) IVPB 1000 mg/200 mL premix  Status:  Discontinued     1,000 mg 200 mL/hr over 60 Minutes Intravenous  Once 09/25/14 2119 09/25/14 2125   09/25/14 2130  vancomycin (VANCOCIN) 2,500 mg in sodium chloride 0.9 % 500 mL IVPB     2,500 mg 250 mL/hr over 120 Minutes Intravenous  Once 09/25/14 2125 09/26/14 0107      DVT prophylaxis: Lovenox  Objective: Blood pressure 151/58, pulse 96, temperature 98.5 F (36.9 C), temperature source Oral, resp. rate 18, height 5\' 6"  (1.676 m), weight 161.254 kg (355 lb 8 oz), SpO2 94 %.  Intake/Output Summary (Last 24 hours) at 10/03/14 1322 Last data filed at 10/03/14 0928  Gross per 24 hour  Intake   1440 ml  Output   3450 ml  Net  -2010 ml   Exam: General: no acute resp distress - alert and conversant  Lungs: distant breath sounds Cardiovascular:  Distant HS - RRR w/o appreciable M  Abdomen: Nontender, morbidly obese, soft, bowel sounds positive, no rebound Extremities: no further pedal edema  Data Reviewed: Basic Metabolic Panel:  Recent Labs Lab 09/27/14 0358 09/28/14 0334 09/29/14 0554 09/30/14 0607 10/01/14 0633 10/02/14 0613 10/03/14 0525  NA 138 135 135 139 138 138 136  K 3.5 4.1 4.2 3.4* 3.0* 3.2* 3.6  CL 107 104 102 101 94* 91* 91*  CO2 23 19 25 29 29  37* 31  GLUCOSE 223* 382* 354* 137* 236* 229* 302*  BUN 15 20 32* 30* 27* 23 23  CREATININE 2.10* 2.61* 2.69* 2.60* 2.36* 2.36* 2.50*  CALCIUM 7.7* 7.7* 7.8* 8.1* 8.0* 8.2* 8.2*  MG 1.2* 1.6  --   --   --   --   --    Liver Function Tests:  Recent Labs Lab 09/27/14 0358  09/28/14 0334 09/29/14 0554  AST 20 57* 40*  ALT 15 40* 54*  ALKPHOS 76 177* 167*  BILITOT 1.2 1.5* 0.4  PROT 5.6* 6.2 6.1  ALBUMIN 2.4* 2.6* 2.5*    Recent Labs Lab 09/27/14 0358  LIPASE 16   CBC:  Recent Labs Lab 09/27/14 0358 09/28/14 0334 09/29/14 0554 09/30/14 0607 10/01/14 0633 10/02/14 0613 10/03/14 0525  WBC 14.4* 17.2* 15.8* 15.0* 13.3* 13.9* 12.3*  NEUTROABS 11.4* 15.7*  --   --   --   --   --   HGB 10.0* 10.8* 10.4* 10.9* 11.3* 12.0 12.6  HCT 32.7* 34.6* 33.4* 35.1* 35.4* 38.8 41.3  MCV 83.8 85.6 82.5 82.6 82.5 83.4 85.5  PLT 209 198 261 303 294 327 374   CBG:  Recent Labs Lab 10/02/14 1714 10/02/14 2245 10/03/14 0456 10/03/14 0746 10/03/14 1145  GLUCAP 226* 232* 275* 301* 279*    Recent Results (from the past 240 hour(s))  Blood Culture (  routine x 2)     Status: None   Collection Time: 09/25/14  9:43 PM  Result Value Ref Range Status   Specimen Description BLOOD ARM RIGHT  Final   Special Requests BOTTLES DRAWN AEROBIC AND ANAEROBIC 10CC  Final   Culture   Final    KLEBSIELLA PNEUMONIAE Note: Gram Stain Report Called to,Read Back By and Verified With: Minna Merritts [facility] 09/29/14 0950 BY SMITHERSJ Performed at [facility]    Report Status 10/01/2014 FINAL  Final   Organism ID, Bacteria KLEBSIELLA PNEUMONIAE  Final      Susceptibility   Klebsiella pneumoniae - MIC*    AMPICILLIN >=32 RESISTANT Resistant     AMPICILLIN/SULBACTAM >=32 RESISTANT Resistant     CEFAZOLIN <=4 SENSITIVE Sensitive     CEFEPIME <=1 SENSITIVE Sensitive     CEFTAZIDIME <=1 SENSITIVE Sensitive     CEFTRIAXONE <=1 SENSITIVE Sensitive     CIPROFLOXACIN <=0.25 SENSITIVE Sensitive     GENTAMICIN <=1 SENSITIVE Sensitive     IMIPENEM <=0.25 SENSITIVE Sensitive     PIP/TAZO 8 SENSITIVE Sensitive     TOBRAMYCIN <=1 SENSITIVE Sensitive     TRIMETH/SULFA <=20 SENSITIVE Sensitive     * KLEBSIELLA PNEUMONIAE  Blood Culture (routine x 2)     Status: None   Collection  Time: 09/25/14  9:58 PM  Result Value Ref Range Status   Specimen Description BLOOD HAND RIGHT  Final   Special Requests BOTTLES DRAWN AEROBIC AND ANAEROBIC 5CC  Final   Culture   Final    STAPHYLOCOCCUS SPECIES (COAGULASE NEGATIVE) Note: THE SIGNIFICANCE OF ISOLATING THIS ORGANISM FROM A SINGLE SET OF BLOOD CULTURES WHEN MULTIPLE SETS ARE DRAWN IS UNCERTAIN. PLEASE NOTIFY THE MICROBIOLOGY DEPARTMENT WITHIN ONE WEEK IF SPECIATION AND SENSITIVITIES ARE REQUIRED. Note: Gram Stain Report Called to,Read Back By and Verified With: TIM IRBY RN @1105PM  Thelma Comp [id number] Performed at [facility]    Report Status 09/29/2014 FINAL  Final  Urine culture     Status: None   Collection Time: 09/25/14 10:20 PM  Result Value Ref Range Status   Specimen Description URINE, RANDOM  Final   Special Requests NONE  Final   Colony Count NO GROWTH Performed at [facility]   Final   Culture NO GROWTH Performed at [facility]   Final   Report Status 09/27/2014 FINAL  Final  MRSA PCR Screening     Status: None   Collection Time: 09/26/14  5:20 AM  Result Value Ref Range Status   MRSA by PCR NEGATIVE NEGATIVE Final    Comment:        The GeneXpert MRSA Assay (FDA approved for NASAL specimens only), is one component of a comprehensive MRSA colonization surveillance program. It is not intended to diagnose MRSA infection nor to guide or monitor treatment for MRSA infections.   Culture, blood (routine x 2)     Status: None   Collection Time: 09/26/14  3:30 PM  Result Value Ref Range Status   Specimen Description BLOOD RIGHT ASSIST CONTROL  Final   Special Requests   Final    BOTTLES DRAWN AEROBIC AND ANAEROBIC 2CC BLUE 5CC RED   Culture   Final    KLEBSIELLA PNEUMONIAE Note: Gram Stain Report Called to,Read Back By and Verified With: Justine Null [id number]  BJENN Performed at [facility]    Report Status 10/03/2014 FINAL  Final   Organism ID, Bacteria KLEBSIELLA  PNEUMONIAE  Final      Susceptibility  Klebsiella pneumoniae - MIC*    AMPICILLIN >=32 RESISTANT Resistant     AMPICILLIN/SULBACTAM >=32 RESISTANT Resistant     CEFAZOLIN <=4 SENSITIVE Sensitive     CEFEPIME <=1 SENSITIVE Sensitive     CEFTAZIDIME <=1 SENSITIVE Sensitive     CEFTRIAXONE <=1 SENSITIVE Sensitive     CIPROFLOXACIN <=0.25 SENSITIVE Sensitive     GENTAMICIN <=1 SENSITIVE Sensitive     IMIPENEM <=0.25 SENSITIVE Sensitive     PIP/TAZO 8 SENSITIVE Sensitive     TOBRAMYCIN <=1 SENSITIVE Sensitive     TRIMETH/SULFA <=20 SENSITIVE Sensitive     * KLEBSIELLA PNEUMONIAE  Culture, blood (routine x 2)     Status: None   Collection Time: 09/26/14  3:42 PM  Result Value Ref Range Status   Specimen Description BLOOD RIGHT HAND  Final   Special Requests BOTTLES DRAWN AEROBIC ONLY [place]  Final   Culture   Final    NO GROWTH 5 DAYS Performed at [facility]    Report Status 10/02/2014 FINAL  Final  Clostridium Difficile by PCR     Status: None   Collection Time: 09/30/14 11:47 PM  Result Value Ref Range Status   C difficile by pcr NEGATIVE NEGATIVE Final  Culture, blood (routine x 2)     Status: None (Preliminary result)   Collection Time: 10/01/14  5:00 PM  Result Value Ref Range Status   Specimen Description BLOOD RIGHT HAND  Final   Special Requests BOTTLES DRAWN AEROBIC ONLY 6CC  Final   Culture   Final           BLOOD CULTURE RECEIVED NO GROWTH TO DATE CULTURE WILL BE HELD FOR 5 DAYS BEFORE ISSUING A FINAL NEGATIVE REPORT Performed at [facility]    Report Status PENDING  Incomplete  Culture, blood (routine x 2)     Status: None (Preliminary result)   Collection Time: 10/01/14  5:10 PM  Result Value Ref Range Status   Specimen Description BLOOD RIGHT FOREARM  Final   Special Requests BOTTLES DRAWN AEROBIC ONLY 3CC  Final   Culture   Final           BLOOD CULTURE RECEIVED NO GROWTH TO DATE CULTURE WILL BE HELD FOR 5 DAYS BEFORE ISSUING A FINAL NEGATIVE  REPORT Performed at [facility]    Report Status PENDING  Incomplete     Studies:  Recent x-ray studies have been reviewed in detail by the Attending Physician  Scheduled Meds:  Scheduled Meds: . cefTRIAXone (ROCEPHIN)  IV  2 g Intravenous Q24H  . enoxaparin (LOVENOX) injection  40 mg Subcutaneous Q24H  . furosemide  80 mg Intravenous Q12H  . glipiZIDE  10 mg Oral Q breakfast  . hydrocerin  1 application Topical BID  . insulin aspart  0-15 Units Subcutaneous TID WC  . insulin aspart  0-5 Units Subcutaneous QHS  . insulin aspart  4 Units Subcutaneous TID WC  . insulin detemir  10 Units Subcutaneous QHS  . metoprolol tartrate  25 mg Oral BID  . potassium chloride  40 mEq Oral TID    Time spent on care of this patient: 35 mins  Debbe Odea, MD [facility] For Consults/Admissions - Flow Manager - [phone] Office  [phone]  Contact MD directly via text page:      [email or web address]      password [facility]  10/03/2014, 1:22 PM   LOS: 7 days

## 2014-10-03 NOTE — Progress Notes (Signed)
MD notified that patient had an orthostatic episode this am while standing for a daily weight and sitting on BSC.  Lying in the bed initially SBP was in 180s, Sitting BP was in 90s/40s with a pulse of 125.  She was diaphoretic.  BP rechecked after patient placed back in the bed and BP was 120s/80s with a pulse in 90s.  MD will notify am rounding team.  No new orders given.  Will continue to monitor patient.

## 2014-10-03 NOTE — Progress Notes (Signed)
Pt has been a 2+ assist with nursing staff to roll, sit, or stand. Very concerned about pt's ability to live home alone, even with [facility] services. Even a transfer to a vehicle would seem to be very difficult/risky.

## 2014-10-04 LAB — CBC
HEMATOCRIT: 40.2 % (ref 36.0–46.0)
Hemoglobin: 12.3 g/dL (ref 12.0–15.0)
MCH: 26.3 pg (ref 26.0–34.0)
MCHC: 30.6 g/dL (ref 30.0–36.0)
MCV: 86.1 fL (ref 78.0–100.0)
Platelets: 375 10*3/uL (ref 150–400)
RBC: 4.67 MIL/uL (ref 3.87–5.11)
RDW: 13.3 % (ref 11.5–15.5)
WBC: 12 10*3/uL — AB (ref 4.0–10.5)

## 2014-10-04 LAB — COMPREHENSIVE METABOLIC PANEL
ALT: 20 U/L (ref 0–35)
ANION GAP: 9 (ref 5–15)
AST: 19 U/L (ref 0–37)
Albumin: 2.6 g/dL — ABNORMAL LOW (ref 3.5–5.2)
Alkaline Phosphatase: 133 U/L — ABNORMAL HIGH (ref 39–117)
BUN: 25 mg/dL — ABNORMAL HIGH (ref 6–23)
CO2: 38 mmol/L — ABNORMAL HIGH (ref 19–32)
Calcium: 8.2 mg/dL — ABNORMAL LOW (ref 8.4–10.5)
Chloride: 90 mmol/L — ABNORMAL LOW (ref 96–112)
Creatinine, Ser: 2.47 mg/dL — ABNORMAL HIGH (ref 0.50–1.10)
GFR calc Af Amer: 22 mL/min — ABNORMAL LOW (ref >90)
GFR calc non Af Amer: 19 mL/min — ABNORMAL LOW (ref >90)
GLUCOSE: 245 mg/dL — AB (ref 70–99)
Potassium: 3.9 mmol/L (ref 3.5–5.1)
SODIUM: 137 mmol/L (ref 135–145)
TOTAL PROTEIN: 6.7 g/dL (ref 6.0–8.3)
Total Bilirubin: 0.5 mg/dL (ref 0.3–1.2)

## 2014-10-04 LAB — GLUCOSE, CAPILLARY
GLUCOSE-CAPILLARY: 238 mg/dL — AB (ref 70–99)
Glucose-Capillary: 308 mg/dL — ABNORMAL HIGH (ref 70–99)

## 2014-10-04 MED ORDER — INSULIN ASPART 100 UNIT/ML ~~LOC~~ SOLN: 8.0000 [IU] | Freq: Three times a day (TID) | SUBCUTANEOUS | Status: DC

## 2014-10-04 MED ORDER — METOPROLOL TARTRATE 25 MG PO TABS: 25.0000 mg | ORAL_TABLET | Freq: Two times a day (BID) | ORAL | Status: DC

## 2014-10-04 MED ORDER — FUROSEMIDE 40 MG PO TABS: 40.0000 mg | ORAL_TABLET | Freq: Every day | ORAL | Status: DC

## 2014-10-04 MED ORDER — INSULIN DETEMIR 100 UNIT/ML ~~LOC~~ SOLN: 25.0000 [IU] | Freq: Every day | SUBCUTANEOUS | Status: DC

## 2014-10-04 MED ORDER — CIPROFLOXACIN HCL 500 MG PO TABS: 500.0000 mg | ORAL_TABLET | Freq: Two times a day (BID) | ORAL | Status: DC

## 2014-10-04 NOTE — Progress Notes (Signed)
Patient discharge teaching given, including activity, diet, follow-up appoints, and medications. Patient verbalized understanding of all discharge instructions. IV access was d/c'd. Vitals are stable. Skin is intact except as charted in most recent assessments. Pt to be escorted out by NT, to be driven home by family. 

## 2014-10-04 NOTE — Progress Notes (Signed)
Patient refused to stand for weight this AM. Patient states " it's to early, I'm tired, maybe later". Will notify oncoming RN to attempt weight later this morning. Jodi Underwood [facility]

## 2014-10-04 NOTE — Discharge Summary (Signed)
Physician Discharge Summary  Jodi Underwood [id number] DOB: 1946/03/28 DOA: 09/25/2014  PCP: Antony Blackbird, MD  Admit date: 09/25/2014 Discharge date: 10/04/2014  Time spent: 40 minutes  Recommendations for Outpatient Follow-up:  1. Follow-up with primary physician within one week. 2. Home health services for PT/OT and aide. 3. BMP in 1 week, follow up on creatinine and electrolytes.  Discharge Diagnoses:  Principal Problem:   Sepsis Active Problems:   DM type 2, uncontrolled, with neuropathy   Morbid obesity   Essential hypertension   Lymphedema   UTI (urinary tract infection)   Asthma   Asthma with acute exacerbation   Blood poisoning   Urinary tract infectious disease   Diabetes type 2, uncontrolled   Acute on chronic renal failure   Essential hypertension, benign   Respiratory distress   Acute cystitis without hematuria   Discharge Condition: Stable  Diet recommendation: Carb mod diet and heart healthy   Filed Weights   09/30/14 0556 09/30/14 1831 10/03/14 0521  Weight: 186 kg (410 lb 0.9 oz) 176.903 kg (390 lb) 161.254 kg (355 lb 8 oz)    History of present illness:  Jodi Underwood is a 69 y.o. female with a history of Type II DM, Essential HTN, Asthma, Morbid Obesity, and Chronic Lymphedema of the BLEs who presents to the ED with complaints of fevers and chills and lower ABD pain and palpitations that started today. She was found to have a fever to 102.5 in the ED, and an initial lactate level of 4.27. A Sepsis work up was initiated and she was placed on IV Vancomycin and Zosyn. A UA was positive.   Hospital Course:   Sepsis due to UTI/ pyelonephritis -Urine culture not helpful in identifying the offending organism, but UA was very strongly + -Abx started 3/11 -Repeat UA on 3/15 looks like infection is clearing -Patient is on Rocephin,  discharge on Cipro to complete 14 days of antibiotics  Klebsiella pneumoniae bacteremia -Patient is on  Rocephin, discharge on ciprofloxacin to complete total 14 days of antibiotics for bacteremia. -Has had 1/6 bottles positive for coagulase-negative staph, likely contaminant.  Acute on chronic renal failure/Diastolic CHF ? Vs fluid overload -Discontinue Ibesartan -Echocardiogram-TTE- reports normal systolic fxn - no mention of diastolic failure but has LA dilatation which is highly suggestive of diastolic CHF -Weight using standing standard scale is down from 380 pounds in admission to 355 pounds today. -Discharged home on 40 mg of Lasix daily, instructed not to drink more than 1500 mL of fluids per day. -Follow-up with primary care physician for further adjustment of Lasix, he needs blood work to be done in 1 week.  Hypokalemia -Secondary to diuresis, replaced with oral supplements.   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  Essential hypertension -poorly controlled - follow w/ diuresis   Lymphedema?- possibly venous stasis  -Consulted [facility]/PT/OT for lymphedema wraps/unna boots - they are recommending we wait until fluid overload resolves prior to placing the wraps- it appears now that she may not needed them as edema has resolved. -Home health services to follow for lymphedema.  Acute respiratory distress -Patient had acute asthma attack+ flash pulmonary edema from  fluid overloadiuresed very well, on discharge placed on 40 mg of Lasix daily.  Transaminitis was probably from congestive hepatopathy, AST and ALT returned to normal prior to discharge.  Asthma Albuterol Nebs PRN  Morbid obesity - Body mass index is  57.41 kg/(m^2).   Procedures:  None  Consultations:    Discharge Exam: Filed Vitals:   10/04/14 0900  BP: 149/60  Pulse: 95  Temp:   Resp:    General: Alert and awake, oriented x3, not in any acute distress. HEENT: anicteric sclera, pupils reactive to light and accommodation, EOMI CVS: S1-S2 clear, no murmur rubs or gallops Chest: clear to auscultation bilaterally, no wheezing, rales or rhonchi Abdomen: soft nontender, nondistended, normal bowel sounds, no organomegaly Extremities: Lower extremity chronic swelling Neuro: Cranial nerves II-XII intact, no focal neurological deficits   Discharge Instructions   Discharge Instructions    Diet - low sodium heart healthy    Complete by:  As directed      Increase activity slowly    Complete by:  As directed           Current Discharge Medication List    START taking these medications   Details  ciprofloxacin (CIPRO) 500 MG tablet Take 1 tablet (500 mg total) by mouth 2 (two) times daily. Qty: 14 tablet, Refills: 0    furosemide (LASIX) 40 MG tablet Take 1 tablet (40 mg total) by mouth daily. Qty: 30 tablet, Refills: 0    metoprolol tartrate (LOPRESSOR) 25 MG tablet Take 1 tablet (25 mg total) by mouth 2 (two) times daily. Qty: 60 tablet, Refills: 0      CONTINUE these medications which have NOT CHANGED   Details  acetaminophen (TYLENOL) 325 MG tablet Take 2 tablets (650 mg total) by mouth every 6 (six) hours as needed. Qty: 30 tablet, Refills: 0    albuterol (PROVENTIL HFA;VENTOLIN HFA) 108 (90 BASE) MCG/ACT inhaler Inhale 2 puffs into the lungs every 6 (six) hours as needed. Qty: 18 g, Refills: 1    glipiZIDE (GLUCOTROL XL) 10 MG 24 hr tablet Take 10 mg by mouth daily with breakfast.     metFORMIN (GLUCOPHAGE-XR) 500 MG 24 hr tablet Take 1,000 mg by mouth daily with breakfast.     traMADol (ULTRAM) 50 MG tablet Take 50 mg by mouth every 6 (six) hours as needed for moderate pain.     docusate sodium 100 MG  CAPS Take 100 mg by mouth 2 (two) times daily. Qty: 10 capsule, Refills: 0    DULoxetine (CYMBALTA) 30 MG capsule Take 1 capsule (30 mg total) by mouth daily. Qty: 30 capsule, Refills: 0    gabapentin (NEURONTIN) 100 MG capsule Take 100-200 mg by mouth 2 (two) times daily.    hydrALAZINE (APRESOLINE) 25 MG tablet Take 1 tablet (25 mg total) by mouth every 8 (eight) hours. Qty: 90 tablet, Refills: 0    hydrocerin (EUCERIN) CREA Apply 1 application topically 2 (two) times daily. Qty: 113 g, Refills: 0    insulin aspart (NOVOLOG) 100 UNIT/ML injection Inject 0-20 Units into the skin 3 (three) times daily with meals. Qty: 1 vial, Refills: 12    insulin detemir (LEVEMIR) 100 UNIT/ML injection Inject 0.2 mLs (20 Units total) into the skin at bedtime. Qty: 10 mL, Refills: 12    oxyCODONE 10 MG TABS Take 1 tablet (10 mg total) by mouth every 4 (four) hours as needed. Qty: 30 tablet, Refills: 0    polyethylene glycol (MIRALAX / GLYCOLAX) packet Take 17  g by mouth 2 (two) times daily. Qty: 14 each, Refills: 0    sitaGLIPtin (JANUVIA) 100 MG tablet Take 1 tablet (100 mg total) by mouth daily. Qty: 90 tablet, Refills: 0    traZODone (DESYREL) 50 MG tablet Take 1 tablet (50 mg total) by mouth at bedtime as needed for sleep (insomnia). Qty: 30 tablet, Refills: 0      STOP taking these medications     valsartan (DIOVAN) 80 MG tablet      doxycycline (VIBRAMYCIN) 50 MG capsule      glipiZIDE (GLUCOTROL) 5 MG tablet      naproxen sodium (ANAPROX) 220 MG tablet        No Known Allergies Follow-up Information    Follow up with [facility].   Why:  hhot for lymphadema wraps also /aide/ hhpt   Contact information:   [address] [place] [address] [phone]       Follow up with FULP, CAMMIE, MD In 1 week.   Specialty:  Family Medicine   Contact information:   [address] [place] [address] [phone]        The results of significant  diagnostics from this hospitalization (including imaging, microbiology, ancillary and laboratory) are listed below for reference.    Significant Diagnostic Studies: Dg Chest Port 1 View  09/27/2014   CLINICAL DATA:  Acute onset of respiratory distress/ shortness of breath. Current history of diabetes, hypertension and acute on chronic renal failure.  EXAM: PORTABLE CHEST - 1 VIEW  COMPARISON:  09/25/2014.  FINDINGS: Interval development of pulmonary venous hypertension and mild interstitial pulmonary edema. No visible pleural effusions. No confluent airspace consolidation. Cardiac silhouette moderately enlarged but stable.  No made of calcification at the insertion of the right supraspinatus tendon on the greater tuberosity of the right humerus.  IMPRESSION: Mild CHF, with stable moderate cardiomegaly and new mild interstitial pulmonary edema since the examination 2 days ago.   Electronically Signed   By: Evangeline Dakin M.D.   On: 09/27/2014 15:32   Dg Chest Port 1 View  09/25/2014   CLINICAL DATA:  69 year old female with nausea, vomiting and fever since yesterday.  EXAM: PORTABLE CHEST - 1 VIEW  COMPARISON:  No priors.  FINDINGS: Study is limited by patient rotation. With these limitations in mind, there is no definite acute consolidative airspace disease. No pleural effusions. No evidence of pulmonary edema. Mild cardiomegaly. Upper mediastinal contours are distorted by patient positioning.  IMPRESSION: 1. No radiographic evidence of acute cardiopulmonary disease. 2. Mild cardiomegaly.   Electronically Signed   By: Vinnie Langton M.D.   On: 09/25/2014 22:28   Dg Abd 2 Views  09/26/2014   CLINICAL DATA:  Reason for exam: Small bowel obstruction. Patient states she is having pain located throughout the abdominal area and is having SOB. Images were best obtainable due to patient condition, unable to tolerate holding a position due to pain and patient body habitus.  EXAM: ABDOMEN - 2 VIEW  COMPARISON:   Chest x-ray 09/25/2014 and lumbar spine 01/16/2013  FINDINGS: Bowel gas pattern is nonobstructive. No free peritoneal air. 1.7 cm round calcification over the right lateral abdomen unchanged. Degenerative changes of the spine and hips.  IMPRESSION: Nonobstructive bowel gas pattern.   Electronically Signed   By: Marin Olp M.D.   On: 09/26/2014 18:44    Microbiology: Recent Results (from the past 240 hour(s))  Blood Culture (routine x 2)     Status: None  Collection Time: 09/25/14  9:43 PM  Result Value Ref Range Status   Specimen Description BLOOD ARM RIGHT  Final   Special Requests BOTTLES DRAWN AEROBIC AND ANAEROBIC 10CC  Final   Culture   Final    KLEBSIELLA PNEUMONIAE Note: Gram Stain Report Called to,Read Back By and Verified With: Minna Merritts [facility] 09/29/14 0950 BY SMITHERSJ Performed at [facility]    Report Status 10/01/2014 FINAL  Final   Organism ID, Bacteria KLEBSIELLA PNEUMONIAE  Final      Susceptibility   Klebsiella pneumoniae - MIC*    AMPICILLIN >=32 RESISTANT Resistant     AMPICILLIN/SULBACTAM >=32 RESISTANT Resistant     CEFAZOLIN <=4 SENSITIVE Sensitive     CEFEPIME <=1 SENSITIVE Sensitive     CEFTAZIDIME <=1 SENSITIVE Sensitive     CEFTRIAXONE <=1 SENSITIVE Sensitive     CIPROFLOXACIN <=0.25 SENSITIVE Sensitive     GENTAMICIN <=1 SENSITIVE Sensitive     IMIPENEM <=0.25 SENSITIVE Sensitive     PIP/TAZO 8 SENSITIVE Sensitive     TOBRAMYCIN <=1 SENSITIVE Sensitive     TRIMETH/SULFA <=20 SENSITIVE Sensitive     * KLEBSIELLA PNEUMONIAE  Blood Culture (routine x 2)     Status: None   Collection Time: 09/25/14  9:58 PM  Result Value Ref Range Status   Specimen Description BLOOD HAND RIGHT  Final   Special Requests BOTTLES DRAWN AEROBIC AND ANAEROBIC 5CC  Final   Culture   Final    STAPHYLOCOCCUS SPECIES (COAGULASE NEGATIVE) Note: THE SIGNIFICANCE OF ISOLATING THIS ORGANISM FROM A SINGLE SET OF BLOOD CULTURES WHEN MULTIPLE SETS ARE DRAWN IS UNCERTAIN.  PLEASE NOTIFY THE MICROBIOLOGY DEPARTMENT WITHIN ONE WEEK IF SPECIATION AND SENSITIVITIES ARE REQUIRED. Note: Gram Stain Report Called to,Read Back By and Verified With: Corie Chiquito RN @1105PM  Thelma Comp [id number] Performed at [facility]    Report Status 09/29/2014 FINAL  Final  Urine culture     Status: None   Collection Time: 09/25/14 10:20 PM  Result Value Ref Range Status   Specimen Description URINE, RANDOM  Final   Special Requests NONE  Final   Colony Count NO GROWTH Performed at [facility]   Final   Culture NO GROWTH Performed at [facility]   Final   Report Status 09/27/2014 FINAL  Final  MRSA PCR Screening     Status: None   Collection Time: 09/26/14  5:20 AM  Result Value Ref Range Status   MRSA by PCR NEGATIVE NEGATIVE Final    Comment:        The GeneXpert MRSA Assay (FDA approved for NASAL specimens only), is one component of a comprehensive MRSA colonization surveillance program. It is not intended to diagnose MRSA infection nor to guide or monitor treatment for MRSA infections.   Culture, blood (routine x 2)     Status: None   Collection Time: 09/26/14  3:30 PM  Result Value Ref Range Status   Specimen Description BLOOD RIGHT ASSIST CONTROL  Final   Special Requests   Final    BOTTLES DRAWN AEROBIC AND ANAEROBIC 2CC BLUE 5CC RED   Culture   Final    KLEBSIELLA PNEUMONIAE Note: Gram Stain Report Called to,Read Back By and Verified With: Justine Null [id number]  BJENN Performed at [facility]    Report Status 10/03/2014 FINAL  Final   Organism ID, Bacteria KLEBSIELLA PNEUMONIAE  Final      Susceptibility   Klebsiella pneumoniae - MIC*    AMPICILLIN >=32 RESISTANT  Resistant     AMPICILLIN/SULBACTAM >=32 RESISTANT Resistant     CEFAZOLIN <=4 SENSITIVE Sensitive     CEFEPIME <=1 SENSITIVE Sensitive     CEFTAZIDIME <=1 SENSITIVE Sensitive     CEFTRIAXONE <=1 SENSITIVE Sensitive     CIPROFLOXACIN <=0.25 SENSITIVE Sensitive      GENTAMICIN <=1 SENSITIVE Sensitive     IMIPENEM <=0.25 SENSITIVE Sensitive     PIP/TAZO 8 SENSITIVE Sensitive     TOBRAMYCIN <=1 SENSITIVE Sensitive     TRIMETH/SULFA <=20 SENSITIVE Sensitive     * KLEBSIELLA PNEUMONIAE  Culture, blood (routine x 2)     Status: None   Collection Time: 09/26/14  3:42 PM  Result Value Ref Range Status   Specimen Description BLOOD RIGHT HAND  Final   Special Requests BOTTLES DRAWN AEROBIC ONLY [place]  Final   Culture   Final    NO GROWTH 5 DAYS Performed at [facility]    Report Status 10/02/2014 FINAL  Final  Clostridium Difficile by PCR     Status: None   Collection Time: 09/30/14 11:47 PM  Result Value Ref Range Status   C difficile by pcr NEGATIVE NEGATIVE Final  Culture, blood (routine x 2)     Status: None (Preliminary result)   Collection Time: 10/01/14  5:00 PM  Result Value Ref Range Status   Specimen Description BLOOD RIGHT HAND  Final   Special Requests BOTTLES DRAWN AEROBIC ONLY 6CC  Final   Culture   Final           BLOOD CULTURE RECEIVED NO GROWTH TO DATE CULTURE WILL BE HELD FOR 5 DAYS BEFORE ISSUING A FINAL NEGATIVE REPORT Performed at [facility]    Report Status PENDING  Incomplete  Culture, blood (routine x 2)     Status: None (Preliminary result)   Collection Time: 10/01/14  5:10 PM  Result Value Ref Range Status   Specimen Description BLOOD RIGHT FOREARM  Final   Special Requests BOTTLES DRAWN AEROBIC ONLY 3CC  Final   Culture   Final           BLOOD CULTURE RECEIVED NO GROWTH TO DATE CULTURE WILL BE HELD FOR 5 DAYS BEFORE ISSUING A FINAL NEGATIVE REPORT Performed at [facility]    Report Status PENDING  Incomplete     Labs: Basic Metabolic Panel:  Recent Labs Lab 09/28/14 0334  09/30/14 0607 10/01/14 0633 10/02/14 0613 10/03/14 0525 10/04/14 0600  NA 135  < > 139 138 138 136 137  K 4.1  < > 3.4* 3.0* 3.2* 3.6 3.9  CL 104  < > 101 94* 91* 91* 90*  CO2 19  < > 29 29 37* 31 38*   GLUCOSE 382*  < > 137* 236* 229* 302* 245*  BUN 20  < > 30* 27* 23 23 25*  CREATININE 2.61*  < > 2.60* 2.36* 2.36* 2.50* 2.47*  CALCIUM 7.7*  < > 8.1* 8.0* 8.2* 8.2* 8.2*  MG 1.6  --   --   --   --   --   --   < > = values in this interval not displayed. Liver Function Tests:  Recent Labs Lab 09/28/14 0334 09/29/14 0554 10/04/14 0600  AST 57* 40* 19  ALT 40* 54* 20  ALKPHOS 177* 167* 133*  BILITOT 1.5* 0.4 0.5  PROT 6.2 6.1 6.7  ALBUMIN 2.6* 2.5* 2.6*   No results for input(s): LIPASE, AMYLASE in the last 168 hours. No results  for input(s): AMMONIA in the last 168 hours. CBC:  Recent Labs Lab 09/28/14 0334  09/30/14 0607 10/01/14 0633 10/02/14 0613 10/03/14 0525 10/04/14 0600  WBC 17.2*  < > 15.0* 13.3* 13.9* 12.3* 12.0*  NEUTROABS 15.7*  --   --   --   --   --   --   HGB 10.8*  < > 10.9* 11.3* 12.0 12.6 12.3  HCT 34.6*  < > 35.1* 35.4* 38.8 41.3 40.2  MCV 85.6  < > 82.6 82.5 83.4 85.5 86.1  PLT 198  < > 303 294 327 374 375  < > = values in this interval not displayed. Cardiac Enzymes: No results for input(s): CKTOTAL, CKMB, CKMBINDEX, TROPONINI in the last 168 hours. BNP: BNP (last 3 results) No results for input(s): BNP in the last 8760 hours.  ProBNP (last 3 results) No results for input(s): PROBNP in the last 8760 hours.  CBG:  Recent Labs Lab 10/03/14 0746 10/03/14 1145 10/03/14 1711 10/03/14 2111 10/04/14 0804  GLUCAP 301* 279* 200* 214* 238*       Signed:  Natalija Mavis A  [facility] 10/04/2014, 11:28 AM

## 2014-10-05 ENCOUNTER — Other Ambulatory Visit: Payer: Self-pay

## 2014-10-05 ENCOUNTER — Encounter (HOSPITAL_BASED_OUTPATIENT_CLINIC_OR_DEPARTMENT_OTHER): Payer: Medicare Other

## 2014-10-05 NOTE — Patient Outreach (Signed)
CSW called patient. CSW informed patient that patient's Medicare plan no longer supported [facility] services. CSW linked patient with home health provider [facility] for further community resources. Patient reports being grateful for assistance with achieving goals.

## 2014-10-08 LAB — CULTURE, BLOOD (ROUTINE X 2)
CULTURE: NO GROWTH
CULTURE: NO GROWTH

## 2014-11-06 ENCOUNTER — Other Ambulatory Visit: Payer: Self-pay | Admitting: Family Medicine

## 2014-11-06 DIAGNOSIS — R229 Localized swelling, mass and lump, unspecified: Secondary | ICD-10-CM

## 2015-09-22 NOTE — Addendum Note (Signed)
Encounter addended by: Leeroy Cha, PT on: 09/22/2015 11:52 AM<BR>     Documentation filed: Episodes, Clinical Notes, Flowsheet VN

## 2015-09-22 NOTE — Addendum Note (Signed)
Encounter addended by: Leeroy Cha, PT on: 09/22/2015 11:53 AM<BR>     Documentation filed: Episodes

## 2016-04-27 ENCOUNTER — Emergency Department (HOSPITAL_COMMUNITY): Payer: Medicare Other

## 2016-04-27 ENCOUNTER — Inpatient Hospital Stay (HOSPITAL_COMMUNITY)
Admission: EM | Admit: 2016-04-27 | Discharge: 2016-05-03 | DRG: 690 | Disposition: A | Discharge status: Home Health | Payer: Medicare Other | Attending: Family Medicine | Admitting: Family Medicine

## 2016-04-27 ENCOUNTER — Encounter (HOSPITAL_COMMUNITY): Payer: Self-pay | Admitting: Emergency Medicine

## 2016-04-27 DIAGNOSIS — Z794 Long term (current) use of insulin: Secondary | ICD-10-CM

## 2016-04-27 DIAGNOSIS — Z79899 Other long term (current) drug therapy: Secondary | ICD-10-CM

## 2016-04-27 DIAGNOSIS — I10 Essential (primary) hypertension: Secondary | ICD-10-CM | POA: Diagnosis not present

## 2016-04-27 DIAGNOSIS — E1165 Type 2 diabetes mellitus with hyperglycemia: Secondary | ICD-10-CM | POA: Diagnosis present

## 2016-04-27 DIAGNOSIS — N3289 Other specified disorders of bladder: Secondary | ICD-10-CM | POA: Diagnosis present

## 2016-04-27 DIAGNOSIS — N179 Acute kidney failure, unspecified: Secondary | ICD-10-CM | POA: Diagnosis not present

## 2016-04-27 DIAGNOSIS — L03115 Cellulitis of right lower limb: Secondary | ICD-10-CM | POA: Diagnosis present

## 2016-04-27 DIAGNOSIS — R935 Abnormal findings on diagnostic imaging of other abdominal regions, including retroperitoneum: Secondary | ICD-10-CM

## 2016-04-27 DIAGNOSIS — N390 Urinary tract infection, site not specified: Principal | ICD-10-CM | POA: Diagnosis present

## 2016-04-27 DIAGNOSIS — M7989 Other specified soft tissue disorders: Secondary | ICD-10-CM | POA: Diagnosis not present

## 2016-04-27 DIAGNOSIS — IMO0002 Reserved for concepts with insufficient information to code with codable children: Secondary | ICD-10-CM | POA: Diagnosis present

## 2016-04-27 DIAGNOSIS — L8990 Pressure ulcer of unspecified site, unspecified stage: Secondary | ICD-10-CM | POA: Insufficient documentation

## 2016-04-27 DIAGNOSIS — J45909 Unspecified asthma, uncomplicated: Secondary | ICD-10-CM | POA: Diagnosis present

## 2016-04-27 DIAGNOSIS — Z6841 Body Mass Index (BMI) 40.0 and over, adult: Secondary | ICD-10-CM

## 2016-04-27 DIAGNOSIS — R1030 Lower abdominal pain, unspecified: Secondary | ICD-10-CM | POA: Diagnosis present

## 2016-04-27 DIAGNOSIS — E1140 Type 2 diabetes mellitus with diabetic neuropathy, unspecified: Secondary | ICD-10-CM | POA: Diagnosis not present

## 2016-04-27 DIAGNOSIS — N3000 Acute cystitis without hematuria: Secondary | ICD-10-CM

## 2016-04-27 DIAGNOSIS — T501X5A Adverse effect of loop [high-ceiling] diuretics, initial encounter: Secondary | ICD-10-CM | POA: Diagnosis not present

## 2016-04-27 DIAGNOSIS — L89892 Pressure ulcer of other site, stage 2: Secondary | ICD-10-CM | POA: Diagnosis present

## 2016-04-27 DIAGNOSIS — K5900 Constipation, unspecified: Secondary | ICD-10-CM | POA: Diagnosis not present

## 2016-04-27 DIAGNOSIS — I890 Lymphedema, not elsewhere classified: Secondary | ICD-10-CM

## 2016-04-27 DIAGNOSIS — L03116 Cellulitis of left lower limb: Secondary | ICD-10-CM | POA: Diagnosis present

## 2016-04-27 LAB — CBC WITH DIFFERENTIAL/PLATELET
Basophils Absolute: 0 10*3/uL (ref 0.0–0.1)
Basophils Relative: 0 %
EOS ABS: 0.3 10*3/uL (ref 0.0–0.7)
EOS PCT: 3 %
HCT: 38.8 % (ref 36.0–46.0)
HEMOGLOBIN: 11.8 g/dL — AB (ref 12.0–15.0)
LYMPHS ABS: 1.8 10*3/uL (ref 0.7–4.0)
Lymphocytes Relative: 20 %
MCH: 26.4 pg (ref 26.0–34.0)
MCHC: 30.4 g/dL (ref 30.0–36.0)
MCV: 86.8 fL (ref 78.0–100.0)
MONO ABS: 1.1 10*3/uL — AB (ref 0.1–1.0)
Monocytes Relative: 12 %
NEUTROS PCT: 65 %
Neutro Abs: 5.9 10*3/uL (ref 1.7–7.7)
Platelets: 290 10*3/uL (ref 150–400)
RBC: 4.47 MIL/uL (ref 3.87–5.11)
RDW: 14 % (ref 11.5–15.5)
WBC: 9.1 10*3/uL (ref 4.0–10.5)

## 2016-04-27 LAB — COMPREHENSIVE METABOLIC PANEL
ALK PHOS: 81 U/L (ref 38–126)
ALT: 16 U/L (ref 14–54)
AST: 24 U/L (ref 15–41)
Albumin: 3.2 g/dL — ABNORMAL LOW (ref 3.5–5.0)
Anion gap: 7 (ref 5–15)
BUN: 16 mg/dL (ref 6–20)
CO2: 26 mmol/L (ref 22–32)
CREATININE: 0.94 mg/dL (ref 0.44–1.00)
Calcium: 8.7 mg/dL — ABNORMAL LOW (ref 8.9–10.3)
Chloride: 106 mmol/L (ref 101–111)
GFR calc Af Amer: >60 mL/min (ref >60)
GFR calc non Af Amer: 60 mL/min — ABNORMAL LOW (ref >60)
Glucose, Bld: 119 mg/dL — ABNORMAL HIGH (ref 65–99)
Potassium: 4.4 mmol/L (ref 3.5–5.1)
SODIUM: 139 mmol/L (ref 135–145)
Total Bilirubin: 0.3 mg/dL (ref 0.3–1.2)
Total Protein: 6.5 g/dL (ref 6.5–8.1)

## 2016-04-27 LAB — URINALYSIS, ROUTINE W REFLEX MICROSCOPIC
BILIRUBIN URINE: NEGATIVE
Glucose, UA: NEGATIVE mg/dL
KETONES UR: NEGATIVE mg/dL
Nitrite: POSITIVE — AB
Protein, ur: 30 mg/dL — AB
SPECIFIC GRAVITY, URINE: 1.017 (ref 1.005–1.030)
pH: 6 (ref 5.0–8.0)

## 2016-04-27 LAB — URINE MICROSCOPIC-ADD ON

## 2016-04-27 LAB — POC OCCULT BLOOD, ED: Fecal Occult Bld: NEGATIVE

## 2016-04-27 LAB — TROPONIN I: Troponin I: 0.03 ng/mL (ref <0.03)

## 2016-04-27 LAB — LIPASE, BLOOD: LIPASE: 26 U/L (ref 11–51)

## 2016-04-27 LAB — BRAIN NATRIURETIC PEPTIDE: B Natriuretic Peptide: 44.9 pg/mL (ref 0.0–100.0)

## 2016-04-27 MED ORDER — IOPAMIDOL (ISOVUE-300) INJECTION 61%
100.0000 mL | Freq: Once | INTRAVENOUS | Status: AC | PRN
  Administered 2016-04-27: 100 mL via INTRAVENOUS

## 2016-04-27 MED ORDER — PHENAZOPYRIDINE HCL 200 MG PO TABS
200.0000 mg | ORAL_TABLET | Freq: Three times a day (TID) | ORAL | Status: DC
  Administered 2016-04-28 – 2016-05-03 (×19): 200 mg via ORAL
  Filled 2016-04-27 (×18): qty 1

## 2016-04-27 MED ORDER — CEPHALEXIN 500 MG PO CAPS
1000.0000 mg | ORAL_CAPSULE | Freq: Once | ORAL | Status: AC
  Administered 2016-04-27: 1000 mg via ORAL
  Filled 2016-04-27: qty 2

## 2016-04-27 MED ORDER — CEPHALEXIN 500 MG PO CAPS: 1000.0000 mg | ORAL_CAPSULE | Freq: Two times a day (BID) | ORAL | 0 refills | Status: DC

## 2016-04-27 MED ORDER — FUROSEMIDE 10 MG/ML IJ SOLN: 40.0000 mg | Freq: Once | INTRAMUSCULAR | Status: DC

## 2016-04-27 NOTE — ED Triage Notes (Signed)
Per [facility]-patient complaining of constipation and possible UTI-patient states she is not having a "complete" BM-recently started on tramadol

## 2016-04-27 NOTE — ED Notes (Signed)
Hospitatlist at bedside.

## 2016-04-27 NOTE — H&P (Signed)
History and Physical    Burnetta Kohls INO:[id number] DOB: 1945/11/28 DOA: 04/27/2016  PCP: Antony Blackbird, MD  Patient coming from: home  Chief Complaint:  Lower abdominal pain, legs swollen  HPI: Manpreet Kemmer is a 70 y.o. female with medical history significant of chronic lymphedema, morbid obesity, htn comes in from home where she lives alone for worsening leg swelling and suprapubic pain.  Pt reports dysuria.  No sob.  No fevers.  No cough.  She use to have PT/HH come to her house to wrap her legs regularly but then they got better and they stopped coming over 6 weeks ago.  She hired someone to wrap her legs, but that person does not really know how to wrap her legs very well.  Since then her legs have gotten more swollen.  Pt takes her lasix regularly at home.  Also for the last several days she has had dysuria and bladder spasms.  Pt has a uti.  Pt was in the process of being discharged when she told dr Colvin Caroli she would not go home.  CM was called and advised pt to be observed overnight.  Pt is tolerating po.    Review of Systems: As per HPI otherwise 10 point review of systems negative.   Past Medical History:  Diagnosis Date  . Anemia   . Arthritis   . Asthma   . Degenerative disc disease   . Diabetes mellitus   . Diabetic neuropathy ([place])   . Hypertension   . Lymphedema   . Pneumonia   . Urinary incontinence     Past Surgical History:  Procedure Laterality Date  . ABDOMINAL HYSTERECTOMY     COMPLETE     reports that she has never smoked. She has never used smokeless tobacco. She reports that she drinks alcohol. She reports that she does not use drugs.  No Known Allergies  Family History  Problem Relation Age of Onset  . Adopted: Yes    Prior to Admission medications   Medication Sig Start Date End Date Taking? Authorizing Provider  acetaminophen (TYLENOL) 325 MG tablet Take 2 tablets (650 mg total) by mouth every 6 (six) hours as needed. 01/27/13   Robbie Lis, MD  albuterol (PROVENTIL HFA;VENTOLIN HFA) 108 (90 BASE) MCG/ACT inhaler Inhale 2 puffs into the lungs every 6 (six) hours as needed. 07/24/11   [name], MD  cephALEXin (KEFLEX) 500 MG capsule Take 2 capsules (1,000 mg total) by mouth 2 (two) times daily. 04/27/16   Charlesetta Shanks, MD  ciprofloxacin (CIPRO) 500 MG tablet Take 1 tablet (500 mg total) by mouth 2 (two) times daily. 10/04/14   [name], MD  furosemide (LASIX) 40 MG tablet Take 1 tablet (40 mg total) by mouth daily. 10/04/14   [name], MD  gabapentin (NEURONTIN) 100 MG capsule Take 100-200 mg by mouth 2 (two) times daily.    Historical Provider, MD  hydrocerin (EUCERIN) CREA Apply 1 application topically 2 (two) times daily. Patient not taking: Reported on 09/25/2014 01/27/13   Robbie Lis, MD  insulin aspart (NOVOLOG) 100 UNIT/ML injection Inject 8 Units into the skin 3 (three) times daily with meals. 10/04/14   [name], MD  insulin detemir (LEVEMIR) 100 UNIT/ML injection Inject 0.25 mLs (25 Units total) into the skin at bedtime. 10/04/14   [name], MD  metFORMIN (GLUCOPHAGE-XR) 500 MG 24 hr tablet Take 1,000 mg by mouth daily with breakfast.  09/18/14   Historical Provider, MD  metoprolol  tartrate (LOPRESSOR) 25 MG tablet Take 1 tablet (25 mg total) by mouth 2 (two) times daily. 10/04/14   [name], MD  traMADol (ULTRAM) 50 MG tablet Take 50 mg by mouth every 6 (six) hours as needed for moderate pain.  09/18/14   Historical Provider, MD    Physical Exam: Vitals:   04/27/16 1603 04/27/16 1604 04/27/16 1950 04/27/16 2143  BP: 156/59  169/70 174/72  Pulse: 92  92 95  Resp: 16  13 18   Temp: 98.2 F (36.8 C)  98.3 F (36.8 C) 98.3 F (36.8 C)  TempSrc: Oral  Oral Oral  SpO2: 96%  97% 97%  Weight:  (!) 181.4 kg (400 lb)    Height:  5\' 6"  (1.676 m)        Constitutional: NAD, calm, comfortable Vitals:   04/27/16 1603 04/27/16 1604 04/27/16 1950 04/27/16 2143  BP: 156/59  169/70 174/72    Pulse: 92  92 95  Resp: 16  13 18   Temp: 98.2 F (36.8 C)  98.3 F (36.8 C) 98.3 F (36.8 C)  TempSrc: Oral  Oral Oral  SpO2: 96%  97% 97%  Weight:  (!) 181.4 kg (400 lb)    Height:  5\' 6"  (1.676 m)     Eyes: PERRL, lids and conjunctivae normal ENMT: Mucous membranes are moist. Posterior pharynx clear of any exudate or lesions.Normal dentition.  Neck: normal, supple, no masses, no thyromegaly Respiratory: clear to auscultation bilaterally, no wheezing, no crackles. Normal respiratory effort. No accessory muscle use.  Cardiovascular: Regular rate and rhythm, no murmurs / rubs / gallops. No extremity edema. 2+ pedal pulses. No carotid bruits.  Abdomen: no tenderness, no masses palpated. No hepatosplenomegaly. Bowel sounds positive.  Musculoskeletal: no clubbing / cyanosis. No joint deformity upper and lower extremities. Good ROM, no contractures. Normal muscle tone.  Skin: no rashes, lesions, ulcers. No induration  chronic lymphedema present Neurologic: CN 2-12 grossly intact. Sensation intact, DTR normal. Strength 5/5 in all 4.  Psychiatric: Normal judgment and insight. Alert and oriented x 3. Normal mood.    Labs on Admission: I have personally reviewed following labs and imaging studies  CBC:  Recent Labs Lab 04/27/16 1632  WBC 9.1  NEUTROABS 5.9  HGB 11.8*  HCT 38.8  MCV 86.8  PLT 222   Basic Metabolic Panel:  Recent Labs Lab 04/27/16 1632  NA 139  K 4.4  CL 106  CO2 26  GLUCOSE 119*  BUN 16  CREATININE 0.94  CALCIUM 8.7*   GFR: Estimated Creatinine Clearance: 93.7 mL/min (by C-G formula based on SCr of 0.94 mg/dL). Liver Function Tests:  Recent Labs Lab 04/27/16 1632  AST 24  ALT 16  ALKPHOS 81  BILITOT 0.3  PROT 6.5  ALBUMIN 3.2*    Recent Labs Lab 04/27/16 1632  LIPASE 26   Cardiac Enzymes:  Recent Labs Lab 04/27/16 1632  TROPONINI 0.03*   Urine analysis:    Component Value Date/Time   COLORURINE YELLOW 04/27/2016 2027    APPEARANCEUR CLOUDY (A) 04/27/2016 2027   LABSPEC 1.017 04/27/2016 2027   PHURINE 6.0 04/27/2016 2027   GLUCOSEU NEGATIVE 04/27/2016 2027   HGBUR MODERATE (A) 04/27/2016 2027   BILIRUBINUR NEGATIVE 04/27/2016 2027   KETONESUR NEGATIVE 04/27/2016 2027   PROTEINUR 30 (A) 04/27/2016 2027   UROBILINOGEN 0.2 09/29/2014 1224   NITRITE POSITIVE (A) 04/27/2016 2027   LEUKOCYTESUR MODERATE (A) 04/27/2016 2027     Radiological Exams on Admission: Ct Abdomen Pelvis W Contrast  Result Date: 04/27/2016 CLINICAL DATA:  Constipation and possible UTI EXAM: CT ABDOMEN AND PELVIS WITH CONTRAST TECHNIQUE: Multidetector CT imaging of the abdomen and pelvis was performed using the standard protocol following bolus administration of intravenous contrast. CONTRAST:  161mL ISOVUE-300 IOPAMIDOL (ISOVUE-300) INJECTION 61% COMPARISON:  09/26/2014 radiograph FINDINGS: Lower chest: Mild areas of linear atelectasis in the lingula and bilateral lower lobes. No acute consolidation or pleural effusion at the lung bases. The heart does not appear enlarged. Mitral annular calcifications are present. There are partially visualize coronary artery calcifications. Hepatobiliary: No focal hepatic abnormality is visualized. No calcified gallstones. No intra or extrahepatic biliary dilatation. Pancreas: Unremarkable. No pancreatic ductal dilatation or surrounding inflammatory changes. Spleen: Normal in size without focal abnormality. Adrenals/Urinary Tract: Bilateral adrenal glands are within normal limits. 1 cm cortical hypodense lesion in the lower pole of right kidney. Vague hypodense mass within the upper pole of the left kidney measuring approximately 3.4 by 2.8 cm, series 2, image number 31. This is better seen on delayed images were excreted contrast is being displaced by the mass. No hydronephrosis. Urinary bladder is distended. Stomach/Bowel: The stomach is nondilated. There is no dilated small bowel to suggest an obstruction.  Moderate stool in the colon. Appendix is not well visualized. Vascular/Lymphatic: Scattered atherosclerotic vascular calcifications. No significantly enlarged retroperitoneal or mesenteric lymph nodes. Reproductive: Non visualized and presumed surgically absent. Other: No free air or free fluid. Large infraumbilical ventral hernia containing only fat. No herniated bowel loops. Musculoskeletal: Advanced multilevel degenerative changes of the thoracolumbar spine with multilevel lumbar canal stenosis. Questionable lucent lesion in the anterior aspect of T9 vertebral body. IMPRESSION: 1. No CT evidence for bowel obstruction, free-air, or free-fluid. 2. Vague hypodense indeterminate mass within the upper pole of the left kidney, recommend further evaluation with non emergent MRI. 3. Infra umbilical fat containing ventral hernia. 4. Questionable lucent lesion anterior aspect of T9. Nonemergent MRI may be considered for further evaluation. Electronically Signed   By: Donavan Foil M.D.   On: 04/27/2016 18:58   Dg Chest Port 1 View  Result Date: 04/27/2016 CLINICAL DATA:  Abdominal pain and lower extremity swelling. Asthma, diabetes, and hypertension. EXAM: PORTABLE CHEST 1 VIEW COMPARISON:  09/27/2014 FINDINGS: Patient is partially rotated to the right. Heart size is stable. Both lungs are clear. No pneumothorax or pleural effusion identified. IMPRESSION: No active disease. Electronically Signed   By: Earle Gell M.D.   On: 04/27/2016 18:14    EKG: Independently reviewed. nsr no acute issues  Assessment/Plan 70 yo female with morbid obesity, worsening of her lymphedema and a uti  Principal Problem:   UTI (urinary tract infection)- po keflex  Active Problems:   DM type 2, uncontrolled, with neuropathy ([place])- cont insulin at home   Morbid obesity ([place])- noted   Essential hypertension- noted   Asthma- stable   lower abdominal pain- likely bladder spasms.  Pyridium and abx   Chronic lymphedema - wrap  legs.    PT evaluation,  CM to address d/c plan   DVT prophylaxis:  scds Code Status:  full Family Communication:  none Disposition Plan:  Per day team Consults called:  none Admission status:  obs   DAVID,RACHAL A MD [facility]  If 7PM-7AM, please contact night-coverage [email or web address] Password [email or web address]  04/27/2016, 10:46 PM

## 2016-04-27 NOTE — ED Provider Notes (Signed)
[facility] DEPT Provider Note   CSN: [id number] Arrival date & time: 04/27/16  1542     History   Chief Complaint Chief Complaint  Patient presents with  . Constipation    HPI Jodi Underwood is a 70 y.o. female.  HPI Is a she's had abdominal pain for a week. She feels that she's been constipated. She reports decreased bowel movement. She reports she does continue to have small amounts of stool output. She indicates about the size of her finger. She reports she gets rectal pain and urgency but then does not have a bowel movement. She also reports that she had a bowel movement on route she believes. That did not resolve all of her discomfort however. She also reports she's had pain and burning with urination and believe she has a urinary tract infection. Patient has had nausea without vomiting. No fever but some chills. Patient has chronic and severe lymphedema. She reports she gets home health care assistance with wrapping her legs until they improve and then they stop coming and her daughter tries to help her but she is not very good at it and the wraps slip off , so now her legs are very swollen. Past Medical History:  Diagnosis Date  . Anemia   . Arthritis   . Asthma   . Degenerative disc disease   . Diabetes mellitus   . Diabetic neuropathy ([place])   . Hypertension   . Lymphedema   . Pneumonia   . Urinary incontinence     Patient Active Problem List   Diagnosis Date Noted  . Respiratory distress   . Acute cystitis without hematuria   . Sepsis ([place]) 09/26/2014  . UTI (urinary tract infection) 09/26/2014  . Asthma 09/26/2014  . Asthma with acute exacerbation   . Blood poisoning ([place])   . Urinary tract infectious disease   . Diabetes type 2, uncontrolled ([place])   . Acute on chronic renal failure ([place])   . Essential hypertension, benign   . Acute renal failure ([place]) 01/25/2013  . Cellulitis of leg, bilateral 01/23/2013  . Ulcers of both lower extremities ([place])  01/23/2013  . Lymphedema 06/13/2011  . DM type 2, uncontrolled, with neuropathy ([place]) 06/23/2010  . Morbid obesity ([place]) 06/23/2010  . ANEMIA-NOS 06/23/2010  . Essential hypertension 06/23/2010  . Asthma 06/23/2010    Past Surgical History:  Procedure Laterality Date  . ABDOMINAL HYSTERECTOMY     COMPLETE    OB History    No data available       Home Medications    Prior to Admission medications   Medication Sig Start Date End Date Taking? Authorizing Provider  acetaminophen (TYLENOL) 325 MG tablet Take 2 tablets (650 mg total) by mouth every 6 (six) hours as needed. 01/27/13   Robbie Lis, MD  albuterol (PROVENTIL HFA;VENTOLIN HFA) 108 (90 BASE) MCG/ACT inhaler Inhale 2 puffs into the lungs every 6 (six) hours as needed. 07/24/11   [name], MD  cephALEXin (KEFLEX) 500 MG capsule Take 2 capsules (1,000 mg total) by mouth 2 (two) times daily. 04/27/16   Charlesetta Shanks, MD  ciprofloxacin (CIPRO) 500 MG tablet Take 1 tablet (500 mg total) by mouth 2 (two) times daily. 10/04/14   [name], MD  furosemide (LASIX) 40 MG tablet Take 1 tablet (40 mg total) by mouth daily. 10/04/14   [name], MD  gabapentin (NEURONTIN) 100 MG capsule Take 100-200 mg by mouth 2 (two) times daily.    Historical Provider,  MD  hydrocerin (EUCERIN) CREA Apply 1 application topically 2 (two) times daily. Patient not taking: Reported on 09/25/2014 01/27/13   Robbie Lis, MD  insulin aspart (NOVOLOG) 100 UNIT/ML injection Inject 8 Units into the skin 3 (three) times daily with meals. 10/04/14   [name], MD  insulin detemir (LEVEMIR) 100 UNIT/ML injection Inject 0.25 mLs (25 Units total) into the skin at bedtime. 10/04/14   [name], MD  metFORMIN (GLUCOPHAGE-XR) 500 MG 24 hr tablet Take 1,000 mg by mouth daily with breakfast.  09/18/14   Historical Provider, MD  metoprolol tartrate (LOPRESSOR) 25 MG tablet Take 1 tablet (25 mg total) by mouth 2 (two) times daily. 10/04/14   [name], MD    traMADol (ULTRAM) 50 MG tablet Take 50 mg by mouth every 6 (six) hours as needed for moderate pain.  09/18/14   Historical Provider, MD    Family History Family History  Problem Relation Age of Onset  . Adopted: Yes    Social History Social History  Substance Use Topics  . Smoking status: Never Smoker  . Smokeless tobacco: Never Used  . Alcohol use Yes     Comment: occassionally     Allergies   Review of patient's allergies indicates no known allergies.   Review of Systems Review of Systems 10 Systems reviewed and are negative for acute change except as noted in the HPI.   Physical Exam Updated Vital Signs BP 174/72 (BP Location: Right Arm)   Pulse 95   Temp 98.3 F (36.8 C) (Oral)   Resp 18   Ht 5\' 6"  (1.676 m)   Wt (!) 400 lb (181.4 kg)   SpO2 97%   BMI 64.56 kg/m   Physical Exam  Constitutional: She is oriented to person, place, and time.  Patient is morbidly obese. Patient is alert and nontoxic. Mental status is clear. No respiratory distress.  HENT:  Head: Normocephalic and atraumatic.  Mouth/Throat: Oropharynx is clear and moist.  Eyes: Conjunctivae and EOM are normal.  Cardiovascular: Normal rate, regular rhythm and intact distal pulses.   Murmur heard. 2/6 systolic ejection murmur heard best at right sternal border.  Pulmonary/Chest: Effort normal and breath sounds normal. No respiratory distress.  Abdominal: Soft. Bowel sounds are normal. She exhibits no distension and no mass. There is tenderness. There is no guarding. A hernia is present.  Patient has abdominal wall hernias infraumbilical slightly to the right. These are firm but not significant only tender. Moderate tenderness left mid quadrant. He does have morbidly obese abdomen. No Candida in the pannus folds.  Genitourinary:  Genitourinary Comments: Rectal exam patient did have large amount of soft brown stool in her diaper. Digital examination reveals soft brown stool in the rectal vault with no  impaction. No palpable mass. No visualized blood.  Musculoskeletal:  The patient has severe peripheral edema. Feet are deformed from degree of swelling. Mild diffuse erythema of the lower extremities.  Neurological: She is alert and oriented to person, place, and time. No cranial nerve deficit. She exhibits normal muscle tone. Coordination normal.  Skin: Skin is warm and dry.  Psychiatric: She has a normal mood and affect.     ED Treatments / Results  Labs (all labs ordered are listed, but only abnormal results are displayed) Labs Reviewed  COMPREHENSIVE METABOLIC PANEL - Abnormal; Notable for the following:       Result Value   Glucose, Bld 119 (*)    Calcium 8.7 (*)    Albumin  3.2 (*)    GFR calc non Af Amer 60 (*)    All other components within normal limits  CBC WITH DIFFERENTIAL/PLATELET - Abnormal; Notable for the following:    Hemoglobin 11.8 (*)    Monocytes Absolute 1.1 (*)    All other components within normal limits  URINALYSIS, ROUTINE W REFLEX MICROSCOPIC (NOT AT [facility]) - Abnormal; Notable for the following:    APPearance CLOUDY (*)    Hgb urine dipstick MODERATE (*)    Protein, ur 30 (*)    Nitrite POSITIVE (*)    Leukocytes, UA MODERATE (*)    All other components within normal limits  TROPONIN I - Abnormal; Notable for the following:    Troponin I 0.03 (*)    All other components within normal limits  URINE MICROSCOPIC-ADD ON - Abnormal; Notable for the following:    Squamous Epithelial / LPF 0-5 (*)    Bacteria, UA MANY (*)    All other components within normal limits  URINE CULTURE  LIPASE, BLOOD  BRAIN NATRIURETIC PEPTIDE  POC OCCULT BLOOD, ED    EKG  EKG Interpretation  Date/Time:  Thursday April 27 2016 18:44:51 EDT Ventricular Rate:  90 PR Interval:    QRS Duration: 98 QT Interval:  357 QTC Calculation: 437 R Axis:   46 Text Interpretation:  Sinus rhythm Nonspecific T abnormalities, lateral leads agree. no STEMI Confirmed by Johnney Killian, MD,  Jeannie Done [phone]) on 04/27/2016 7:43:34 PM       Radiology Ct Abdomen Pelvis W Contrast  Result Date: 04/27/2016 CLINICAL DATA:  Constipation and possible UTI EXAM: CT ABDOMEN AND PELVIS WITH CONTRAST TECHNIQUE: Multidetector CT imaging of the abdomen and pelvis was performed using the standard protocol following bolus administration of intravenous contrast. CONTRAST:  14mL ISOVUE-300 IOPAMIDOL (ISOVUE-300) INJECTION 61% COMPARISON:  09/26/2014 radiograph FINDINGS: Lower chest: Mild areas of linear atelectasis in the lingula and bilateral lower lobes. No acute consolidation or pleural effusion at the lung bases. The heart does not appear enlarged. Mitral annular calcifications are present. There are partially visualize coronary artery calcifications. Hepatobiliary: No focal hepatic abnormality is visualized. No calcified gallstones. No intra or extrahepatic biliary dilatation. Pancreas: Unremarkable. No pancreatic ductal dilatation or surrounding inflammatory changes. Spleen: Normal in size without focal abnormality. Adrenals/Urinary Tract: Bilateral adrenal glands are within normal limits. 1 cm cortical hypodense lesion in the lower pole of right kidney. Vague hypodense mass within the upper pole of the left kidney measuring approximately 3.4 by 2.8 cm, series 2, image number 31. This is better seen on delayed images were excreted contrast is being displaced by the mass. No hydronephrosis. Urinary bladder is distended. Stomach/Bowel: The stomach is nondilated. There is no dilated small bowel to suggest an obstruction. Moderate stool in the colon. Appendix is not well visualized. Vascular/Lymphatic: Scattered atherosclerotic vascular calcifications. No significantly enlarged retroperitoneal or mesenteric lymph nodes. Reproductive: Non visualized and presumed surgically absent. Other: No free air or free fluid. Large infraumbilical ventral hernia containing only fat. No herniated bowel loops. Musculoskeletal:  Advanced multilevel degenerative changes of the thoracolumbar spine with multilevel lumbar canal stenosis. Questionable lucent lesion in the anterior aspect of T9 vertebral body. IMPRESSION: 1. No CT evidence for bowel obstruction, free-air, or free-fluid. 2. Vague hypodense indeterminate mass within the upper pole of the left kidney, recommend further evaluation with non emergent MRI. 3. Infra umbilical fat containing ventral hernia. 4. Questionable lucent lesion anterior aspect of T9. Nonemergent MRI may be considered for further evaluation. Electronically Signed  By: Donavan Foil M.D.   On: 04/27/2016 18:58   Dg Chest Port 1 View  Result Date: 04/27/2016 CLINICAL DATA:  Abdominal pain and lower extremity swelling. Asthma, diabetes, and hypertension. EXAM: PORTABLE CHEST 1 VIEW COMPARISON:  09/27/2014 FINDINGS: Patient is partially rotated to the right. Heart size is stable. Both lungs are clear. No pneumothorax or pleural effusion identified. IMPRESSION: No active disease. Electronically Signed   By: Earle Gell M.D.   On: 04/27/2016 18:14    Procedures Procedures (including critical care time)  Medications Ordered in ED Medications  phenazopyridine (PYRIDIUM) tablet 200 mg (not administered)  furosemide (LASIX) injection 40 mg (not administered)  iopamidol (ISOVUE-300) 61 % injection 100 mL (100 mLs Intravenous Contrast Given 04/27/16 1811)  cephALEXin (KEFLEX) capsule 1,000 mg (1,000 mg Oral Given 04/27/16 2226)     Initial Impression / Assessment and Plan / ED Course  I have reviewed the triage vital signs and the nursing notes.  Pertinent labs & imaging results that were available during my care of the patient were reviewed by me and considered in my medical decision making (see chart for details).  Clinical Course  Consult: Case management Consult: [facility] for admission.  Final Clinical Impressions(s) / ED Diagnoses   Final diagnoses:  Acute cystitis without  hematuria  Constipation, unspecified constipation type  Lymphedema of both lower extremities   Patient is alert and appropriate. She is nontoxic. She does not have respiratory distress. Patient reports that she is continuing to get spasmodic pains in her rectal and pelvic area. CT does not show evidence of acute surgical etiology. No stranding or inflammatory changes around the rectum. Digital examination shows the patient not to have impaction. She has soft mobile stool in the vault. I did not appreciate any masses. He is passing feces that is consistency of semi-formed stool. Bowel movement was not frankly diarrheal. Patient also reports her lymphedema is worse than usual. She is having difficulty adequately wrapping her extremities. She cannot do this by herself. Patient is tearful and reports she cannot go home with the amount of pain she has and the swelling which is causing her immobilization. Case management was consulted. Internal medicine consulted for patient admission given patient reports intractable pain and inability to function with ADLs due to lymphedema. New Prescriptions New Prescriptions   CEPHALEXIN (KEFLEX) 500 MG CAPSULE    Take 2 capsules (1,000 mg total) by mouth 2 (two) times daily.     Charlesetta Shanks, MD 04/27/16 2236

## 2016-04-27 NOTE — ED Notes (Signed)
Patient transported to CT 

## 2016-04-27 NOTE — Progress Notes (Addendum)
[facility] consulted for home health needs, however, patient began crying reporting that she is in too much pain to go home.  The patient and her daughter (who was oh the phone)  Reports the swelling that patient is having is not the usual swelling, it's much more.  Patient reports when she is at home she is usually able to care for herself.  She reports she is able to complete her ADL's, make her own meals and do her laundry.  She reports she has been slowly declining over the past week.  She reports the generalized swelling is so bad she cannot get into her mobile wheelchair.  She reports she has had a decreased appetite, "I just don't feel like eating."  She reports increased weakness and fatigue.  She reports she feels that "There is something inflamed down there and I don't know what it is."  Patient reports she is afraid to go home alone tonight while she is in this condition.  Patient noted to have a UTI, pmhx of diabetes.  Patient reports she does not want to go to a nursing facility.  Discussed patient with EDP.  Awaiting disposition.

## 2016-04-28 ENCOUNTER — Observation Stay (HOSPITAL_BASED_OUTPATIENT_CLINIC_OR_DEPARTMENT_OTHER): Payer: Medicare Other

## 2016-04-28 DIAGNOSIS — Z794 Long term (current) use of insulin: Secondary | ICD-10-CM | POA: Diagnosis not present

## 2016-04-28 DIAGNOSIS — E1165 Type 2 diabetes mellitus with hyperglycemia: Secondary | ICD-10-CM | POA: Diagnosis present

## 2016-04-28 DIAGNOSIS — T501X5A Adverse effect of loop [high-ceiling] diuretics, initial encounter: Secondary | ICD-10-CM | POA: Diagnosis not present

## 2016-04-28 DIAGNOSIS — L8990 Pressure ulcer of unspecified site, unspecified stage: Secondary | ICD-10-CM | POA: Insufficient documentation

## 2016-04-28 DIAGNOSIS — N179 Acute kidney failure, unspecified: Secondary | ICD-10-CM | POA: Diagnosis not present

## 2016-04-28 DIAGNOSIS — Z79899 Other long term (current) drug therapy: Secondary | ICD-10-CM | POA: Diagnosis not present

## 2016-04-28 DIAGNOSIS — R1030 Lower abdominal pain, unspecified: Secondary | ICD-10-CM

## 2016-04-28 DIAGNOSIS — R935 Abnormal findings on diagnostic imaging of other abdominal regions, including retroperitoneum: Secondary | ICD-10-CM | POA: Diagnosis not present

## 2016-04-28 DIAGNOSIS — I10 Essential (primary) hypertension: Secondary | ICD-10-CM | POA: Diagnosis present

## 2016-04-28 DIAGNOSIS — E1140 Type 2 diabetes mellitus with diabetic neuropathy, unspecified: Secondary | ICD-10-CM | POA: Diagnosis present

## 2016-04-28 DIAGNOSIS — I890 Lymphedema, not elsewhere classified: Secondary | ICD-10-CM | POA: Diagnosis present

## 2016-04-28 DIAGNOSIS — N3289 Other specified disorders of bladder: Secondary | ICD-10-CM | POA: Diagnosis present

## 2016-04-28 DIAGNOSIS — Z6841 Body Mass Index (BMI) 40.0 and over, adult: Secondary | ICD-10-CM | POA: Diagnosis not present

## 2016-04-28 DIAGNOSIS — K5900 Constipation, unspecified: Secondary | ICD-10-CM | POA: Diagnosis present

## 2016-04-28 DIAGNOSIS — M7989 Other specified soft tissue disorders: Secondary | ICD-10-CM | POA: Diagnosis present

## 2016-04-28 DIAGNOSIS — L03116 Cellulitis of left lower limb: Secondary | ICD-10-CM | POA: Diagnosis present

## 2016-04-28 DIAGNOSIS — N3000 Acute cystitis without hematuria: Secondary | ICD-10-CM | POA: Diagnosis not present

## 2016-04-28 DIAGNOSIS — N390 Urinary tract infection, site not specified: Secondary | ICD-10-CM | POA: Diagnosis present

## 2016-04-28 DIAGNOSIS — J45909 Unspecified asthma, uncomplicated: Secondary | ICD-10-CM | POA: Diagnosis present

## 2016-04-28 DIAGNOSIS — L03115 Cellulitis of right lower limb: Secondary | ICD-10-CM | POA: Diagnosis present

## 2016-04-28 DIAGNOSIS — L89892 Pressure ulcer of other site, stage 2: Secondary | ICD-10-CM | POA: Diagnosis present

## 2016-04-28 LAB — GLUCOSE, CAPILLARY
GLUCOSE-CAPILLARY: 99 mg/dL (ref 65–99)
GLUCOSE-CAPILLARY: 133 mg/dL — AB (ref 65–99)
Glucose-Capillary: 102 mg/dL — ABNORMAL HIGH (ref 65–99)
Glucose-Capillary: 112 mg/dL — ABNORMAL HIGH (ref 65–99)
Glucose-Capillary: 113 mg/dL — ABNORMAL HIGH (ref 65–99)

## 2016-04-28 MED ORDER — CEPHALEXIN 500 MG PO CAPS
500.0000 mg | ORAL_CAPSULE | Freq: Two times a day (BID) | ORAL | Status: DC
  Administered 2016-04-28: 500 mg via ORAL
  Filled 2016-04-28: qty 1

## 2016-04-28 MED ORDER — METOPROLOL TARTRATE 25 MG PO TABS
25.0000 mg | ORAL_TABLET | Freq: Two times a day (BID) | ORAL | Status: DC
  Administered 2016-04-28 – 2016-05-03 (×12): 25 mg via ORAL
  Filled 2016-04-28 (×12): qty 1

## 2016-04-28 MED ORDER — INSULIN ASPART 100 UNIT/ML ~~LOC~~ SOLN
8.0000 [IU] | Freq: Three times a day (TID) | SUBCUTANEOUS | Status: DC
  Administered 2016-04-28 – 2016-05-03 (×7): 8 [IU] via SUBCUTANEOUS

## 2016-04-28 MED ORDER — GABAPENTIN 100 MG PO CAPS: 100.0000 mg | ORAL_CAPSULE | Freq: Two times a day (BID) | ORAL | Status: DC

## 2016-04-28 MED ORDER — TRAMADOL HCL 50 MG PO TABS
100.0000 mg | ORAL_TABLET | Freq: Four times a day (QID) | ORAL | Status: DC | PRN
Start: 2016-04-28 — End: 2016-05-03
  Administered 2016-04-28 – 2016-05-01 (×7): 100 mg via ORAL
  Filled 2016-04-28 (×7): qty 2

## 2016-04-28 MED ORDER — CEFAZOLIN SODIUM-DEXTROSE 2-4 GM/100ML-% IV SOLN
2.0000 g | Freq: Three times a day (TID) | INTRAVENOUS | Status: DC
  Administered 2016-04-28 – 2016-05-02 (×13): 2 g via INTRAVENOUS
  Filled 2016-04-28 (×17): qty 100

## 2016-04-28 MED ORDER — FUROSEMIDE 40 MG PO TABS
40.0000 mg | ORAL_TABLET | Freq: Every day | ORAL | Status: DC
  Administered 2016-04-28: 40 mg via ORAL
  Filled 2016-04-28: qty 1

## 2016-04-28 MED ORDER — FUROSEMIDE 10 MG/ML IJ SOLN
40.0000 mg | Freq: Every day | INTRAMUSCULAR | Status: DC
  Administered 2016-04-28: 40 mg via INTRAVENOUS
  Filled 2016-04-28: qty 4

## 2016-04-28 MED ORDER — TRAMADOL HCL 50 MG PO TABS
100.0000 mg | ORAL_TABLET | Freq: Once | ORAL | Status: AC | PRN
  Administered 2016-04-28: 100 mg via ORAL
  Filled 2016-04-28: qty 2

## 2016-04-28 MED ORDER — SODIUM CHLORIDE 0.9% FLUSH
3.0000 mL | Freq: Two times a day (BID) | INTRAVENOUS | Status: DC
  Administered 2016-04-28 – 2016-05-02 (×8): 3 mL via INTRAVENOUS

## 2016-04-28 MED ORDER — ACETAMINOPHEN 325 MG PO TABS
650.0000 mg | ORAL_TABLET | Freq: Four times a day (QID) | ORAL | Status: DC | PRN
  Filled 2016-04-28: qty 2

## 2016-04-28 MED ORDER — ZOLPIDEM TARTRATE 5 MG PO TABS
5.0000 mg | ORAL_TABLET | Freq: Every evening | ORAL | Status: DC | PRN
  Administered 2016-04-28 – 2016-05-01 (×4): 5 mg via ORAL
  Filled 2016-04-28 (×4): qty 1

## 2016-04-28 MED ORDER — SODIUM CHLORIDE 0.9 % IV SOLN: 250.0000 mL | INTRAVENOUS | Status: DC | PRN

## 2016-04-28 MED ORDER — SODIUM CHLORIDE 0.9% FLUSH: 3.0000 mL | INTRAVENOUS | Status: DC | PRN

## 2016-04-28 MED ORDER — INSULIN ASPART 100 UNIT/ML ~~LOC~~ SOLN
0.0000 [IU] | Freq: Three times a day (TID) | SUBCUTANEOUS | Status: DC
  Administered 2016-04-29: 1 [IU] via SUBCUTANEOUS
  Administered 2016-04-29: 2 [IU] via SUBCUTANEOUS
  Administered 2016-04-29: 1 [IU] via SUBCUTANEOUS
  Administered 2016-04-30: 2 [IU] via SUBCUTANEOUS
  Administered 2016-04-30 – 2016-05-02 (×6): 1 [IU] via SUBCUTANEOUS
  Administered 2016-05-02: 2 [IU] via SUBCUTANEOUS
  Administered 2016-05-03: 1 [IU] via SUBCUTANEOUS
  Administered 2016-05-03: 2 [IU] via SUBCUTANEOUS

## 2016-04-28 MED ORDER — ENOXAPARIN SODIUM 40 MG/0.4ML ~~LOC~~ SOLN
40.0000 mg | SUBCUTANEOUS | Status: DC
  Administered 2016-04-28 – 2016-04-29 (×2): 40 mg via SUBCUTANEOUS
  Filled 2016-04-28 (×2): qty 0.4

## 2016-04-28 NOTE — Care Management Obs Status (Signed)
[place] NOTIFICATION   Patient Details  Name: Jodi Underwood MRN: [id number] Date of Birth: 07/01/1946   Medicare Observation Status Notification Given:       Lynnell Catalan, RN 04/28/2016, 1:25 PM

## 2016-04-28 NOTE — Progress Notes (Addendum)
PROGRESS NOTE    Jodi Underwood  GQQ:[id number] DOB: 07/02/1946 DOA: 04/27/2016 PCP: Antony Blackbird, MD    Brief Narrative: Jodi Underwood is a 70 y.o. female with medical history significant of chronic lymphedema, morbid obesity, htn comes in from home where she lives alone for worsening leg swelling and suprapubic pain.   Assessment & Plan:   Principal Problem:   UTI (urinary tract infection) Active Problems:   DM type 2, uncontrolled, with neuropathy ([place])   Morbid obesity ([place])   Essential hypertension   Asthma   Intractable lower abdominal pain   Pressure injury of skin  Lymphadema of the lower extremities, with mild to mod cellulitis: - started on IV ancef.  - IV lasix for diuresis.  - Venous duplex for evaluation of DVT.    UTI: Cultures done, on IV ancef.    Diabetes mellitus: CBG (last 3)   Recent Labs  04/28/16 0759 04/28/16 1215 04/28/16 1720  GLUCAP 112* 99 102*    Resume SSI.  hgba1c ordered.    Lower abdominal pain: Probably sec to constipation.  Stool softeners ordered    DVT prophylaxis: (lovenox.  Code Status: (Full) Family Communication: none at bedside.  Disposition Plan: pending further evaluation.    Consultants:   None.    Procedures: none.    Antimicrobials:  IV ancef.    Subjective: abd pain and leg pain.   Objective: Vitals:   04/27/16 1950 04/27/16 2143 04/28/16 0029 04/28/16 0509  BP: 169/70 174/72 (!) 175/76 130/61  Pulse: 92 95 94 96  Resp: 13 18 18 18   Temp: 98.3 F (36.8 C) 98.3 F (36.8 C) 98 F (36.7 C) 99 F (37.2 C)  TempSrc: Oral Oral Oral Oral  SpO2: 97% 97% 97% 95%  Weight:   (!) 200.5 kg (442 lb)   Height:   5\' 6"  (1.676 m)     Intake/Output Summary (Last 24 hours) at 04/28/16 1833 Last data filed at 04/28/16 1500  Gross per 24 hour  Intake              343 ml  Output             3300 ml  Net            -2957 ml   Filed Weights   04/27/16 1604 04/28/16 0029  Weight: (!)  181.4 kg (400 lb) (!) 200.5 kg (442 lb)    Examination:  General exam: Appears calm and comfortable  Respiratory system: Clear to auscultation. Respiratory effort normal. Cardiovascular system: S1 & S2 heard, RRR. No JVD, murmurs, rubs, gallops or clicks. No pedal edema. Gastrointestinal system: Abdomen is nondistended, soft and nontender. No organomegaly or masses felt. Normal bowel sounds heard. Central nervous system: Alert and oriented. No focal neurological deficits. Extremities: Symmetric 5 x 5 power.bilateral lower extremity lymphadema.   Psychiatry: Judgement and insight appear normal. Mood & affect appropriate.     Data Reviewed: I have personally reviewed following labs and imaging studies  CBC:  Recent Labs Lab 04/27/16 1632  WBC 9.1  NEUTROABS 5.9  HGB 11.8*  HCT 38.8  MCV 86.8  PLT 671   Basic Metabolic Panel:  Recent Labs Lab 04/27/16 1632  NA 139  K 4.4  CL 106  CO2 26  GLUCOSE 119*  BUN 16  CREATININE 0.94  CALCIUM 8.7*   GFR: Estimated Creatinine Clearance: 100.3 mL/min (by C-G formula based on SCr of 0.94 mg/dL). Liver Function Tests:  Recent Labs Lab 04/27/16 [id number]  AST 24  ALT 16  ALKPHOS 81  BILITOT 0.3  PROT 6.5  ALBUMIN 3.2*    Recent Labs Lab 04/27/16 1632  LIPASE 26   No results for input(s): AMMONIA in the last 168 hours. Coagulation Profile: No results for input(s): INR, PROTIME in the last 168 hours. Cardiac Enzymes:  Recent Labs Lab 04/27/16 1632  TROPONINI 0.03*   BNP (last 3 results) No results for input(s): PROBNP in the last 8760 hours. HbA1C: No results for input(s): HGBA1C in the last 72 hours. CBG:  Recent Labs Lab 04/28/16 0025 04/28/16 0759 04/28/16 1215 04/28/16 1720  GLUCAP 113* 112* 99 102*   Lipid Profile: No results for input(s): CHOL, HDL, LDLCALC, TRIG, CHOLHDL, LDLDIRECT in the last 72 hours. Thyroid Function Tests: No results for input(s): TSH, T4TOTAL, FREET4, T3FREE, THYROIDAB in  the last 72 hours. Anemia Panel: No results for input(s): VITAMINB12, FOLATE, FERRITIN, TIBC, IRON, RETICCTPCT in the last 72 hours. Sepsis Labs: No results for input(s): PROCALCITON, LATICACIDVEN in the last 168 hours.  No results found for this or any previous visit (from the past 240 hour(s)).       Radiology Studies: Ct Abdomen Pelvis W Contrast  Result Date: 04/27/2016 CLINICAL DATA:  Constipation and possible UTI EXAM: CT ABDOMEN AND PELVIS WITH CONTRAST TECHNIQUE: Multidetector CT imaging of the abdomen and pelvis was performed using the standard protocol following bolus administration of intravenous contrast. CONTRAST:  160mL ISOVUE-300 IOPAMIDOL (ISOVUE-300) INJECTION 61% COMPARISON:  09/26/2014 radiograph FINDINGS: Lower chest: Mild areas of linear atelectasis in the lingula and bilateral lower lobes. No acute consolidation or pleural effusion at the lung bases. The heart does not appear enlarged. Mitral annular calcifications are present. There are partially visualize coronary artery calcifications. Hepatobiliary: No focal hepatic abnormality is visualized. No calcified gallstones. No intra or extrahepatic biliary dilatation. Pancreas: Unremarkable. No pancreatic ductal dilatation or surrounding inflammatory changes. Spleen: Normal in size without focal abnormality. Adrenals/Urinary Tract: Bilateral adrenal glands are within normal limits. 1 cm cortical hypodense lesion in the lower pole of right kidney. Vague hypodense mass within the upper pole of the left kidney measuring approximately 3.4 by 2.8 cm, series 2, image number 31. This is better seen on delayed images were excreted contrast is being displaced by the mass. No hydronephrosis. Urinary bladder is distended. Stomach/Bowel: The stomach is nondilated. There is no dilated small bowel to suggest an obstruction. Moderate stool in the colon. Appendix is not well visualized. Vascular/Lymphatic: Scattered atherosclerotic vascular  calcifications. No significantly enlarged retroperitoneal or mesenteric lymph nodes. Reproductive: Non visualized and presumed surgically absent. Other: No free air or free fluid. Large infraumbilical ventral hernia containing only fat. No herniated bowel loops. Musculoskeletal: Advanced multilevel degenerative changes of the thoracolumbar spine with multilevel lumbar canal stenosis. Questionable lucent lesion in the anterior aspect of T9 vertebral body. IMPRESSION: 1. No CT evidence for bowel obstruction, free-air, or free-fluid. 2. Vague hypodense indeterminate mass within the upper pole of the left kidney, recommend further evaluation with non emergent MRI. 3. Infra umbilical fat containing ventral hernia. 4. Questionable lucent lesion anterior aspect of T9. Nonemergent MRI may be considered for further evaluation. Electronically Signed   By: Donavan Foil M.D.   On: 04/27/2016 18:58   Dg Chest Port 1 View  Result Date: 04/27/2016 CLINICAL DATA:  Abdominal pain and lower extremity swelling. Asthma, diabetes, and hypertension. EXAM: PORTABLE CHEST 1 VIEW COMPARISON:  09/27/2014 FINDINGS: Patient is partially rotated to the right. Heart size is stable. Both lungs  are clear. No pneumothorax or pleural effusion identified. IMPRESSION: No active disease. Electronically Signed   By: Earle Gell M.D.   On: 04/27/2016 18:14        Scheduled Meds: .  ceFAZolin (ANCEF) IV  2 g Intravenous Q8H  . furosemide  40 mg Intravenous Daily  . insulin aspart  0-9 Units Subcutaneous TID WC  . insulin aspart  8 Units Subcutaneous TID WC  . metoprolol tartrate  25 mg Oral BID  . phenazopyridine  200 mg Oral TID WC  . sodium chloride flush  3 mL Intravenous Q12H   Continuous Infusions:    LOS: 0 days    Time spent: 25 minutes.     [name], MD [facility] Pager [phone]  If 7PM-7AM, please contact night-coverage [email or web address] Password [facility] 04/28/2016, 6:33 PM

## 2016-04-28 NOTE — Progress Notes (Signed)
*  PRELIMINARY RESULTS* Vascular Ultrasound Lower extremity venous duplex has been completed.  Preliminary findings: Very limited study due to body habitus and edema. Only bilateral common femoral veins and proximal femoral veins were visualized and no DVT is noted.    Landry Mellow, RDMS, RVT  04/28/2016, 3:13 PM

## 2016-04-28 NOTE — Care Management Note (Signed)
Case Management Note  Patient Details  Name: Jodi Underwood MRN: [id number] Date of Birth: 01-Dec-1945  Subjective/Objective:     70 yo admitted with UTI and lymphadema               Action/Plan: From home alone. Pt has a hospital bed and wheel chair at home. SNF was recommended during PT eval however pt is in observation status and would not qualify for SNF unless inpatient status for 3 days. Pt was offered choice for [facility] services and states that she has used [place] in the past and would like to use them again. [facility] rep contacted for referral and MD orders were received. CM will continue to follow.  Expected Discharge Date:                  Expected Discharge Plan:  [place]  In-House Referral:     Discharge planning Services  CM Consult  Post Acute Care Choice:  Home Health Choice offered to:  Patient  DME Arranged:    DME Agency:     HH Arranged:  RN, PT, OT, Nurse's Aide [place] Agency:  [facility] (now [facility])  Status of Service:  In process, will continue to follow  If discussed at Long Length of Stay Meetings, dates discussed:    Additional CommentsLynnell Catalan, RN 04/28/2016, 2:31 PM  [phone]

## 2016-04-28 NOTE — Progress Notes (Signed)
   04/28/16 1000  Clinical Encounter Type  Visited With Patient  Visit Type Spiritual support  Referral From Nurse  Consult/Referral To Chaplain  Spiritual Encounters  Spiritual Needs Prayer;Emotional  Stress Factors  Patient Stress Factors Loss of control;Health changes  CHP responded to [facility] . Provided ministry of presence, listening, encouragement and prayer.  Lovely patient with strong personal faith.

## 2016-04-28 NOTE — Clinical Social Work Note (Signed)
Clinical Social Work Assessment  Patient Details  Name: Jodi Underwood MRN: [id number] Date of Birth: October 04, 1945  Date of referral:  04/28/16               Reason for consult:  Care Management Concerns                Permission sought to share information with:    Permission granted to share information::     Name::        Agency::     Relationship::     Contact Information:     Housing/Transportation Living arrangements for the past 2 months:  Single Family Home Source of Information:  Patient, Medical Team Patient Interpreter Needed:  None Criminal Activity/Legal Involvement Pertinent to Current Situation/Hospitalization:  No - Comment as needed Significant Relationships:  Adult Children Lives with:  Self Do you feel safe going back to the place where you live?  Yes Need for family participation in patient care:  Yes (Comment)  Care giving concerns:  Patient reports she lives at home and needs to 24 hour supervision and care. Patient is reluctant to go to SNF due to a bad experience and their inability to care for as she feels appropriate. The patient has family in area but they work and cannot provide care for patient. The patient worked with PT and recommended SNF.    Social Worker assessment / plan:  LCSWA met with patient at bedside and explained reason for consult. Patient reports she is going home with Home Health in order to to continue with her needed care outpatient treatment. Patient is worried about transportaiton and has looking to find agency that will help her transport.  LCSWA provided support and education about SNF process in hopes patient will reconsider in future.   Employment status:  Retired Forensic scientist:  [employer] PT Recommendations:  Research officer, trade union, [place] / Referral to community resources:     Patient/Family's Response to care:  Agreeable.   Patient/Family's Understanding of and Emotional Response to  Diagnosis, Current Treatment, and Prognosis:  " They will not give me proper care at nursing facilities. I want to continue with my treatment at the cancer center."  Emotional Assessment Appearance:  Appears stated age Attitude/Demeanor/Rapport:    Affect (typically observed):  Calm Orientation:  Oriented to Self, Oriented to Place, Oriented to  Time, Oriented to Situation Alcohol / Substance use:  Never Used Psych involvement (Current and /or in the community):  No (Comment)  Discharge Needs  Concerns to be addressed:  Care Coordination Readmission within the last 30 days:  No Current discharge risk:  Dependent with Mobility, Lives alone Barriers to Discharge:      Lia Hopping, LCSW 04/28/2016, 5:33 PM

## 2016-04-28 NOTE — Evaluation (Signed)
Physical Therapy Evaluation Patient Details Name: Jodi Underwood MRN: [id number] DOB: [birth year] Today's Date: 04/28/2016   History of Present Illness  70 y.o. female admitted with UTI, BLE lymphedema. PMH of DM2, asthma, HTN, morbid obesity, chronic lymphedema BLEs  Clinical Impression  Pt admitted with above diagnosis. Pt currently with functional limitations due to the deficits listed below (see PT Problem List). Pt requiring max assist to roll in bed for linen changes. During PT eval she was not able to attempt mobility due to severe bladder spasm pain at rest. Pain meds requested. She is not able to care for herself at home where she lives alone, cannot fit into her WC due to progressing BLE lymphedema, and sleeps in her urine at night. SNF recommended. She is agreeable to SNF but stated she needs to go somewhere that could do proper lymphedema wrapping for her legs. She stated her daughter has been doing her wrapping but doesn't do it correctly and the edema has gotten progressively worse.  Pt will benefit from skilled PT to increase their independence and safety with mobility to allow discharge to the venue listed below.       Follow Up Recommendations Supervision/Assistance - 24 hour;SNF    Equipment Recommendations  Wheelchair (measurements PT)    Recommendations for Other Services       Precautions / Restrictions Precautions Precautions: Fall Restrictions Weight Bearing Restrictions: No      Mobility  Bed Mobility Overal bed mobility: +2 for physical assistance             General bed mobility comments: unable to attempt 2* painful bladder spasms at rest in bed. Pt stated she needs total assist to roll for linen changes, she can initiate roll with her trunk but requires assist for moving her legs  Transfers                    Ambulation/Gait                Stairs            Wheelchair Mobility    Modified Rankin (Stroke Patients  Only)       Balance                                             Pertinent Vitals/Pain Pain Assessment: 0-10 Pain Score: 7  Pain Location: bladder/rectum Pain Descriptors / Indicators: Spasm Pain Intervention(s): Limited activity within patient's tolerance;Monitored during session;Patient requesting pain meds-RN notified    Home Living Family/patient expects to be discharged to:: Private residence Living Arrangements: Alone Available Help at Discharge: Family;Available PRN/intermittently   Home Access: Ramped entrance     Home Layout: One level Home Equipment: Bedside commode;Wheelchair - power;Walker - 2 wheels;Walker - 4 wheels Additional Comments: has been using 3 in 1, daughters can at times get groceries, has some groceries delivered, had used public transportation but last few weeks LE edema has worsened and she can't fit all the way into her WC    Prior Function Level of Independence: Independent with assistive device(s)         Comments: walked only short distances with RW, sleeps in chair (stated she urinates in chair during the night), uses power WC which doesn't fit her well, has hospital bed but can't lift her legs into it bc they're so swollen  Hand Dominance        Extremity/Trunk Assessment   Upper Extremity Assessment: Overall WFL for tasks assessed           Lower Extremity Assessment: RLE deficits/detail;LLE deficits/detail RLE Deficits / Details: sensation intact to light touch BLEs, BLEs profoundly edematous, stated her daughter had been taught how to do lymphedema wrapping but doesn't do it correctly, can actively PF/DF ankles, B knee flesion AAROM 30* limited by soft tissue, hip ABD AAROM 15* B limited by pain (L worse than R)       Communication   Communication: No difficulties  Cognition Arousal/Alertness: Awake/alert Behavior During Therapy: WFL for tasks assessed/performed Overall Cognitive Status: Within  Functional Limits for tasks assessed                      General Comments      Exercises     Assessment/Plan    PT Assessment Patient needs continued PT services  PT Problem List Decreased strength;Decreased range of motion;Decreased activity tolerance;Decreased mobility;Pain          PT Treatment Interventions DME instruction;Functional mobility training;Therapeutic exercise;Therapeutic activities;Gait training;Patient/family education    PT Goals (Current goals can be found in the Care Plan section)  Acute Rehab PT Goals Patient Stated Goal: return to independence PT Goal Formulation: With patient Time For Goal Achievement: 05/12/16 Potential to Achieve Goals: Fair    Frequency Min 3X/week   Barriers to discharge Decreased caregiver support lives alone, daughter can only assist occasionally, pt not able to care for self (unable to walk, cannot fit into her power WC, sleeps in urine)    Co-evaluation               End of Session   Activity Tolerance: Patient limited by pain Patient left: in bed;with call bell/phone within reach;with nursing/sitter in room Nurse Communication: Mobility status;Need for lift equipment;Patient requests pain meds    Functional Assessment Tool Used: clinical judgement Functional Limitation: Mobility: Walking and moving around Mobility: Walking and Moving Around Current Status [phone]): At least 80 percent but less than 100 percent impaired, limited or restricted Mobility: Walking and Moving Around Goal Status [phone]): At least 40 percent but less than 60 percent impaired, limited or restricted    Time: 1018-1040 PT Time Calculation (min) (ACUTE ONLY): 22 min   Charges:   PT Evaluation $PT Eval Moderate Complexity: 1 Procedure     PT G Codes:   PT G-Codes **NOT FOR INPATIENT CLASS** Functional Assessment Tool Used: clinical judgement Functional Limitation: Mobility: Walking and moving around Mobility: Walking and  Moving Around Current Status ([id number]): At least 80 percent but less than 100 percent impaired, limited or restricted Mobility: Walking and Moving Around Goal Status [phone]): At least 40 percent but less than 60 percent impaired, limited or restricted    Philomena Doheny 04/28/2016, 10:52 AM [phone]

## 2016-04-29 ENCOUNTER — Ambulatory Visit (HOSPITAL_COMMUNITY): Admit: 2016-04-29 | Payer: Medicare Other

## 2016-04-29 LAB — GLUCOSE, CAPILLARY
GLUCOSE-CAPILLARY: 125 mg/dL — AB (ref 65–99)
GLUCOSE-CAPILLARY: 161 mg/dL — AB (ref 65–99)
GLUCOSE-CAPILLARY: 132 mg/dL — AB (ref 65–99)
Glucose-Capillary: 135 mg/dL — ABNORMAL HIGH (ref 65–99)

## 2016-04-29 LAB — BASIC METABOLIC PANEL
ANION GAP: 7 (ref 5–15)
BUN: 15 mg/dL (ref 6–20)
CHLORIDE: 99 mmol/L — AB (ref 101–111)
CO2: 31 mmol/L (ref 22–32)
Calcium: 8.5 mg/dL — ABNORMAL LOW (ref 8.9–10.3)
Creatinine, Ser: 1.19 mg/dL — ABNORMAL HIGH (ref 0.44–1.00)
GFR calc non Af Amer: 45 mL/min — ABNORMAL LOW (ref >60)
GFR, EST AFRICAN AMERICAN: 52 mL/min — AB (ref >60)
GLUCOSE: 117 mg/dL — AB (ref 65–99)
POTASSIUM: 3.8 mmol/L (ref 3.5–5.1)
Sodium: 137 mmol/L (ref 135–145)

## 2016-04-29 LAB — CBC
HEMATOCRIT: 34.8 % — AB (ref 36.0–46.0)
HEMOGLOBIN: 10.5 g/dL — AB (ref 12.0–15.0)
MCH: 26.3 pg (ref 26.0–34.0)
MCHC: 30.2 g/dL (ref 30.0–36.0)
MCV: 87.2 fL (ref 78.0–100.0)
Platelets: 293 10*3/uL (ref 150–400)
RBC: 3.99 MIL/uL (ref 3.87–5.11)
RDW: 13.7 % (ref 11.5–15.5)
WBC: 8.5 10*3/uL (ref 4.0–10.5)

## 2016-04-29 LAB — URINE CULTURE

## 2016-04-29 MED ORDER — DEXTROSE 5 % IV SOLN
500.0000 mg | Freq: Four times a day (QID) | INTRAVENOUS | Status: DC | PRN
  Administered 2016-04-29: 500 mg via INTRAVENOUS
  Filled 2016-04-29 (×2): qty 5

## 2016-04-29 MED ORDER — MORPHINE SULFATE 10 MG/5ML PO SOLN
5.0000 mg | Freq: Once | ORAL | Status: AC
  Administered 2016-05-01: 5 mg via ORAL
  Filled 2016-04-29: qty 5

## 2016-04-29 MED ORDER — MORPHINE SULFATE 15 MG PO TABS: 15.0000 mg | ORAL_TABLET | Freq: Once | ORAL | Status: DC

## 2016-04-29 MED ORDER — METHOCARBAMOL 1000 MG/10ML IJ SOLN
500.0000 mg | Freq: Three times a day (TID) | INTRAVENOUS | Status: DC | PRN
  Administered 2016-04-29: 500 mg via INTRAVENOUS
  Filled 2016-04-29: qty 550
  Filled 2016-04-29: qty 5

## 2016-04-29 MED ORDER — ALPRAZOLAM 0.5 MG PO TABS
0.5000 mg | ORAL_TABLET | Freq: Once | ORAL | Status: DC
  Filled 2016-04-29 (×2): qty 1

## 2016-04-29 NOTE — Progress Notes (Signed)
PROGRESS NOTE    Melizza Kanode  MPN:[id number] DOB: 19-Oct-1945 DOA: 04/27/2016 PCP: Antony Blackbird, MD    Brief Narrative: Jodi Underwood is a 70 y.o. female with medical history significant of chronic lymphedema, morbid obesity, htn comes in from home where she lives alone for worsening leg swelling and suprapubic pain.   Assessment & Plan:   Principal Problem:   UTI (urinary tract infection) Active Problems:   DM type 2, uncontrolled, with neuropathy ([place])   Morbid obesity ([place])   Essential hypertension   Asthma   Intractable lower abdominal pain   Pressure injury of skin  Lymphadema of the lower extremities, with mild to mod cellulitis: - started on IV ancef.  - she was started on IV lasix, but had to be discontinued for worsening creatinine.  - Venous duplex negative for DVT.    UTI: Cultures done, on IV ancef. Cultures show multiple bacterial morphotypes, repeat cultures will be sent.  Added pyridium and robaxin for bladder spasms,   Diabetes mellitus: CBG (last 3)   Recent Labs  04/28/16 2254 04/29/16 0739 04/29/16 1244  GLUCAP 133* 125* 161*    Resume SSI.  hgba1c ordered.    Lower abdominal pain: Probably sec to constipation vs bladder spasms.  Stool softeners ordered   Abnormal CT abd showing hypodense mass on the left kidney and lucency on t9. MRI abd and t spine ordered.    Acute kidney injury; Probably from lasix.   Repeat labs in am. .  Stopped lasix.    DVT prophylaxis: (lovenox.  Code Status: (Full) Family Communication: none at bedside.  Disposition Plan: SNF when stable.    Consultants:   None.    Procedures: none.    Antimicrobials:  IV ancef.    Subjective: abd pain and leg pain.   Objective: Vitals:   04/28/16 1430 04/28/16 2148 04/29/16 0500 04/29/16 1012  BP: 128/68 134/64 126/63 (!) 120/103  Pulse: 90 80 78 79  Resp: 18 15 17    Temp: 98.6 F (37 C) 98.4 F (36.9 C) 98.3 F (36.8 C)   TempSrc:  Oral Oral Oral   SpO2: 95% 95% 98%   Weight:      Height:        Intake/Output Summary (Last 24 hours) at 04/29/16 1524 Last data filed at 04/29/16 1400  Gross per 24 hour  Intake              830 ml  Output             2600 ml  Net            -1770 ml   Filed Weights   04/27/16 1604 04/28/16 0029  Weight: (!) 181.4 kg (400 lb) (!) 200.5 kg (442 lb)    Examination:  General exam: Appears calm and comfortable  Respiratory system: Clear to auscultation. Respiratory effort normal. Cardiovascular system: S1 & S2 heard, RRR. No JVD, murmurs, rubs, gallops or clicks. No pedal edema. Gastrointestinal system: Abdomen is nondistended, soft and nontender. No organomegaly or masses felt. Normal bowel sounds heard. Central nervous system: Alert and oriented. No focal neurological deficits. Extremities: Symmetric 5 x 5 power.bilateral lower extremity lymphadema.   Psychiatry: Judgement and insight appear normal. Mood & affect appropriate.     Data Reviewed: I have personally reviewed following labs and imaging studies  CBC:  Recent Labs Lab 04/27/16 1632 04/29/16 0421  WBC 9.1 8.5  NEUTROABS 5.9  --   HGB 11.8* 10.5*  HCT 38.8  34.8*  MCV 86.8 87.2  PLT 290 443   Basic Metabolic Panel:  Recent Labs Lab 04/27/16 1632 04/29/16 0421  NA 139 137  K 4.4 3.8  CL 106 99*  CO2 26 31  GLUCOSE 119* 117*  BUN 16 15  CREATININE 0.94 1.19*  CALCIUM 8.7* 8.5*   GFR: Estimated Creatinine Clearance: 79.3 mL/min (by C-G formula based on SCr of 1.19 mg/dL (H)). Liver Function Tests:  Recent Labs Lab 04/27/16 1632  AST 24  ALT 16  ALKPHOS 81  BILITOT 0.3  PROT 6.5  ALBUMIN 3.2*    Recent Labs Lab 04/27/16 1632  LIPASE 26   No results for input(s): AMMONIA in the last 168 hours. Coagulation Profile: No results for input(s): INR, PROTIME in the last 168 hours. Cardiac Enzymes:  Recent Labs Lab 04/27/16 1632  TROPONINI 0.03*   BNP (last 3 results) No results  for input(s): PROBNP in the last 8760 hours. HbA1C: No results for input(s): HGBA1C in the last 72 hours. CBG:  Recent Labs Lab 04/28/16 1215 04/28/16 1720 04/28/16 2254 04/29/16 0739 04/29/16 1244  GLUCAP 99 102* 133* 125* 161*   Lipid Profile: No results for input(s): CHOL, HDL, LDLCALC, TRIG, CHOLHDL, LDLDIRECT in the last 72 hours. Thyroid Function Tests: No results for input(s): TSH, T4TOTAL, FREET4, T3FREE, THYROIDAB in the last 72 hours. Anemia Panel: No results for input(s): VITAMINB12, FOLATE, FERRITIN, TIBC, IRON, RETICCTPCT in the last 72 hours. Sepsis Labs: No results for input(s): PROCALCITON, LATICACIDVEN in the last 168 hours.  Recent Results (from the past 240 hour(s))  Urine culture     Status: Abnormal   Collection Time: 04/27/16  8:41 PM  Result Value Ref Range Status   Specimen Description URINE, CLEAN CATCH  Final   Special Requests NONE  Final   Culture MULTIPLE SPECIES PRESENT, SUGGEST RECOLLECTION (A)  Final   Report Status 04/29/2016 FINAL  Final         Radiology Studies: Ct Abdomen Pelvis W Contrast  Result Date: 04/27/2016 CLINICAL DATA:  Constipation and possible UTI EXAM: CT ABDOMEN AND PELVIS WITH CONTRAST TECHNIQUE: Multidetector CT imaging of the abdomen and pelvis was performed using the standard protocol following bolus administration of intravenous contrast. CONTRAST:  160mL ISOVUE-300 IOPAMIDOL (ISOVUE-300) INJECTION 61% COMPARISON:  09/26/2014 radiograph FINDINGS: Lower chest: Mild areas of linear atelectasis in the lingula and bilateral lower lobes. No acute consolidation or pleural effusion at the lung bases. The heart does not appear enlarged. Mitral annular calcifications are present. There are partially visualize coronary artery calcifications. Hepatobiliary: No focal hepatic abnormality is visualized. No calcified gallstones. No intra or extrahepatic biliary dilatation. Pancreas: Unremarkable. No pancreatic ductal dilatation or  surrounding inflammatory changes. Spleen: Normal in size without focal abnormality. Adrenals/Urinary Tract: Bilateral adrenal glands are within normal limits. 1 cm cortical hypodense lesion in the lower pole of right kidney. Vague hypodense mass within the upper pole of the left kidney measuring approximately 3.4 by 2.8 cm, series 2, image number 31. This is better seen on delayed images were excreted contrast is being displaced by the mass. No hydronephrosis. Urinary bladder is distended. Stomach/Bowel: The stomach is nondilated. There is no dilated small bowel to suggest an obstruction. Moderate stool in the colon. Appendix is not well visualized. Vascular/Lymphatic: Scattered atherosclerotic vascular calcifications. No significantly enlarged retroperitoneal or mesenteric lymph nodes. Reproductive: Non visualized and presumed surgically absent. Other: No free air or free fluid. Large infraumbilical ventral hernia containing only fat. No herniated bowel loops. Musculoskeletal:  Advanced multilevel degenerative changes of the thoracolumbar spine with multilevel lumbar canal stenosis. Questionable lucent lesion in the anterior aspect of T9 vertebral body. IMPRESSION: 1. No CT evidence for bowel obstruction, free-air, or free-fluid. 2. Vague hypodense indeterminate mass within the upper pole of the left kidney, recommend further evaluation with non emergent MRI. 3. Infra umbilical fat containing ventral hernia. 4. Questionable lucent lesion anterior aspect of T9. Nonemergent MRI may be considered for further evaluation. Electronically Signed   By: Donavan Foil M.D.   On: 04/27/2016 18:58   Dg Chest Port 1 View  Result Date: 04/27/2016 CLINICAL DATA:  Abdominal pain and lower extremity swelling. Asthma, diabetes, and hypertension. EXAM: PORTABLE CHEST 1 VIEW COMPARISON:  09/27/2014 FINDINGS: Patient is partially rotated to the right. Heart size is stable. Both lungs are clear. No pneumothorax or pleural effusion  identified. IMPRESSION: No active disease. Electronically Signed   By: Earle Gell M.D.   On: 04/27/2016 18:14        Scheduled Meds: .  ceFAZolin (ANCEF) IV  2 g Intravenous Q8H  . enoxaparin (LOVENOX) injection  40 mg Subcutaneous Q24H  . insulin aspart  0-9 Units Subcutaneous TID WC  . insulin aspart  8 Units Subcutaneous TID WC  . metoprolol tartrate  25 mg Oral BID  . morphine  5 mg Oral Once  . phenazopyridine  200 mg Oral TID WC  . sodium chloride flush  3 mL Intravenous Q12H   Continuous Infusions:    LOS: 1 day    Time spent: 25 minutes.     [name], MD [facility] Pager [phone]  If 7PM-7AM, please contact night-coverage [email or web address] Password [email or web address] 04/29/2016, 3:24 PM

## 2016-04-30 LAB — BASIC METABOLIC PANEL
ANION GAP: 6 (ref 5–15)
BUN: 14 mg/dL (ref 6–20)
CALCIUM: 8.6 mg/dL — AB (ref 8.9–10.3)
CO2: 32 mmol/L (ref 22–32)
Chloride: 99 mmol/L — ABNORMAL LOW (ref 101–111)
Creatinine, Ser: 1.02 mg/dL — ABNORMAL HIGH (ref 0.44–1.00)
GFR calc Af Amer: >60 mL/min (ref >60)
GFR, EST NON AFRICAN AMERICAN: 54 mL/min — AB (ref >60)
GLUCOSE: 139 mg/dL — AB (ref 65–99)
Potassium: 3.8 mmol/L (ref 3.5–5.1)
SODIUM: 137 mmol/L (ref 135–145)

## 2016-04-30 LAB — GLUCOSE, CAPILLARY
GLUCOSE-CAPILLARY: 111 mg/dL — AB (ref 65–99)
Glucose-Capillary: 123 mg/dL — ABNORMAL HIGH (ref 65–99)
Glucose-Capillary: 160 mg/dL — ABNORMAL HIGH (ref 65–99)
Glucose-Capillary: 141 mg/dL — ABNORMAL HIGH (ref 65–99)

## 2016-04-30 MED ORDER — ENOXAPARIN SODIUM 100 MG/ML ~~LOC~~ SOLN
100.0000 mg | SUBCUTANEOUS | Status: DC
  Administered 2016-04-30 – 2016-05-02 (×3): 100 mg via SUBCUTANEOUS
  Filled 2016-04-30 (×3): qty 1

## 2016-04-30 MED ORDER — ALUM & MAG HYDROXIDE-SIMETH 200-200-20 MG/5ML PO SUSP
30.0000 mL | Freq: Four times a day (QID) | ORAL | Status: DC | PRN
  Filled 2016-04-30: qty 30

## 2016-04-30 MED ORDER — FUROSEMIDE 40 MG PO TABS
40.0000 mg | ORAL_TABLET | Freq: Every day | ORAL | Status: DC
  Administered 2016-04-30 – 2016-05-03 (×4): 40 mg via ORAL
  Filled 2016-04-30 (×4): qty 1

## 2016-04-30 NOTE — Progress Notes (Signed)
Pt offered a bath and she refused.

## 2016-04-30 NOTE — Progress Notes (Signed)
PROGRESS NOTE    Jodi Underwood  VZD:[id number] DOB: 24-Sep-1945 DOA: 04/27/2016 PCP: Antony Blackbird, MD    Brief Narrative: Jodi Underwood is a 70 y.o. female with medical history significant of chronic lymphedema, morbid obesity, htn comes in from home where she lives alone for worsening leg swelling and suprapubic pain.   Assessment & Plan:   Principal Problem:   UTI (urinary tract infection) Active Problems:   DM type 2, uncontrolled, with neuropathy ([place])   Morbid obesity ([place])   Essential hypertension   Asthma   Intractable lower abdominal pain   Pressure injury of skin  Lymphadema of the lower extremities, with mild to mod cellulitis: - started on IV ancef.  - she was started on IV lasix, but had to be discontinued for worsening creatinine. Creatinine stabilized , lasix restarted.  - Venous duplex negative for DVT.  - cellulitis improving.   UTI: Cultures done, on IV ancef. Cultures show multiple bacterial morphotypes, repeat cultures will be sent.  Added pyridium and robaxin for bladder spasms, pt reports the spasms are better.    Diabetes mellitus: CBG (last 3)   Recent Labs  04/29/16 2145 04/30/16 0852 04/30/16 1203  GLUCAP 135* 123* 160*    Resume SSI.  hgba1c pending.    Lower abdominal pain: Probably sec to constipation vs bladder spasms.  Stool softeners ordered   Abnormal CT abd showing hypodense mass on the left kidney and lucency on t9. MRI abd and t spine ordered, pending, .    Acute kidney injury; Probably from lasix.   Stopped IV lasix, and creatinine improved. Restarted lasix at a lower dose oral.    DVT prophylaxis: (lovenox.  Code Status: (Full) Family Communication: none at bedside.  Disposition Plan: SNF tomorrow.    Consultants:   None.    Procedures: none.    Antimicrobials:  IV ancef.    Subjective: abd cramps improved. Leg pain has improved.   Objective: Vitals:   04/29/16 1900 04/30/16 0625 04/30/16  1130 04/30/16 1400  BP: (!) 172/86 (!) 151/63 (!) 158/69 (!) 146/61  Pulse:  77 79 66  Resp: 17 18  17   Temp: 98.3 F (36.8 C) 98.2 F (36.8 C)  98.1 F (36.7 C)  TempSrc: Oral Oral  Oral  SpO2: 96% 92%  97%  Weight:      Height:        Intake/Output Summary (Last 24 hours) at 04/30/16 1639 Last data filed at 04/30/16 1300  Gross per 24 hour  Intake              575 ml  Output             2125 ml  Net            -1550 ml   Filed Weights   04/27/16 1604 04/28/16 0029  Weight: (!) 181.4 kg (400 lb) (!) 200.5 kg (442 lb)    Examination:  General exam: Appears calm and comfortable  Respiratory system: Clear to auscultation. Respiratory effort normal. Cardiovascular system: S1 & S2 heard, RRR. No JVD, murmurs, rubs, gallops or clicks. No pedal edema. Gastrointestinal system: Abdomen is nondistended, soft and nontender. No organomegaly or masses felt. Normal bowel sounds heard. Central nervous system: Alert and oriented. No focal neurological deficits. Extremities: Symmetric 5 x 5 power.bilateral lower extremity lymphadema. Redness and tenderness improved.   Psychiatry: Judgement and insight appear normal. Mood & affect appropriate.     Data Reviewed: I have personally reviewed following labs  and imaging studies  CBC:  Recent Labs Lab 04/27/16 1632 04/29/16 0421  WBC 9.1 8.5  NEUTROABS 5.9  --   HGB 11.8* 10.5*  HCT 38.8 34.8*  MCV 86.8 87.2  PLT 290 150   Basic Metabolic Panel:  Recent Labs Lab 04/27/16 1632 04/29/16 0421 04/30/16 0347  NA 139 137 137  K 4.4 3.8 3.8  CL 106 99* 99*  CO2 26 31 32  GLUCOSE 119* 117* 139*  BUN 16 15 14   CREATININE 0.94 1.19* 1.02*  CALCIUM 8.7* 8.5* 8.6*   GFR: Estimated Creatinine Clearance: 92.5 mL/min (by C-G formula based on SCr of 1.02 mg/dL (H)). Liver Function Tests:  Recent Labs Lab 04/27/16 1632  AST 24  ALT 16  ALKPHOS 81  BILITOT 0.3  PROT 6.5  ALBUMIN 3.2*    Recent Labs Lab 04/27/16 1632    LIPASE 26   No results for input(s): AMMONIA in the last 168 hours. Coagulation Profile: No results for input(s): INR, PROTIME in the last 168 hours. Cardiac Enzymes:  Recent Labs Lab 04/27/16 1632  TROPONINI 0.03*   BNP (last 3 results) No results for input(s): PROBNP in the last 8760 hours. HbA1C: No results for input(s): HGBA1C in the last 72 hours. CBG:  Recent Labs Lab 04/29/16 1244 04/29/16 1716 04/29/16 2145 04/30/16 0852 04/30/16 1203  GLUCAP 161* 132* 135* 123* 160*   Lipid Profile: No results for input(s): CHOL, HDL, LDLCALC, TRIG, CHOLHDL, LDLDIRECT in the last 72 hours. Thyroid Function Tests: No results for input(s): TSH, T4TOTAL, FREET4, T3FREE, THYROIDAB in the last 72 hours. Anemia Panel: No results for input(s): VITAMINB12, FOLATE, FERRITIN, TIBC, IRON, RETICCTPCT in the last 72 hours. Sepsis Labs: No results for input(s): PROCALCITON, LATICACIDVEN in the last 168 hours.  Recent Results (from the past 240 hour(s))  Urine culture     Status: Abnormal   Collection Time: 04/27/16  8:41 PM  Result Value Ref Range Status   Specimen Description URINE, CLEAN CATCH  Final   Special Requests NONE  Final   Culture MULTIPLE SPECIES PRESENT, SUGGEST RECOLLECTION (A)  Final   Report Status 04/29/2016 FINAL  Final         Radiology Studies: No results found.      Scheduled Meds: . ALPRAZolam  0.5 mg Oral Once  .  ceFAZolin (ANCEF) IV  2 g Intravenous Q8H  . enoxaparin (LOVENOX) injection  100 mg Subcutaneous Q24H  . furosemide  40 mg Oral Daily  . insulin aspart  0-9 Units Subcutaneous TID WC  . insulin aspart  8 Units Subcutaneous TID WC  . metoprolol tartrate  25 mg Oral BID  . morphine  5 mg Oral Once  . phenazopyridine  200 mg Oral TID WC  . sodium chloride flush  3 mL Intravenous Q12H   Continuous Infusions:    LOS: 2 days    Time spent: 25 minutes.     [name], MD [facility] Pager [phone]  If 7PM-7AM,  please contact night-coverage [email or web address] Password [facility] 04/30/2016, 4:39 PM

## 2016-05-01 ENCOUNTER — Ambulatory Visit (HOSPITAL_COMMUNITY): Admit: 2016-05-01 | Payer: Medicare Other

## 2016-05-01 ENCOUNTER — Ambulatory Visit (HOSPITAL_COMMUNITY)
Admit: 2016-05-01 | Discharge: 2016-05-01 | Disposition: A | Discharge status: Home / Self Care | Payer: Medicare Other | Attending: Internal Medicine | Admitting: Internal Medicine

## 2016-05-01 LAB — HEMOGLOBIN A1C
HEMOGLOBIN A1C: 6.6 % — AB (ref 4.8–5.6)
MEAN PLASMA GLUCOSE: 143 mg/dL

## 2016-05-01 LAB — GLUCOSE, CAPILLARY
GLUCOSE-CAPILLARY: 137 mg/dL — AB (ref 65–99)
GLUCOSE-CAPILLARY: 126 mg/dL — AB (ref 65–99)
GLUCOSE-CAPILLARY: 148 mg/dL — AB (ref 65–99)
Glucose-Capillary: 149 mg/dL — ABNORMAL HIGH (ref 65–99)

## 2016-05-01 MED ORDER — TRAMADOL HCL 50 MG PO TABS: 100.0000 mg | ORAL_TABLET | Freq: Four times a day (QID) | ORAL | 0 refills | Status: DC | PRN

## 2016-05-01 MED ORDER — LORAZEPAM 1 MG PO TABS
1.0000 mg | ORAL_TABLET | Freq: Once | ORAL | Status: AC
  Administered 2016-05-01: 1 mg via ORAL
  Filled 2016-05-01: qty 1

## 2016-05-01 MED ORDER — METOPROLOL TARTRATE 25 MG PO TABS: 25.0000 mg | ORAL_TABLET | Freq: Two times a day (BID) | ORAL | 1 refills | Status: DC

## 2016-05-01 MED ORDER — INSULIN ASPART 100 UNIT/ML ~~LOC~~ SOLN: 8.0000 [IU] | Freq: Three times a day (TID) | SUBCUTANEOUS | 11 refills | Status: DC

## 2016-05-01 NOTE — Progress Notes (Signed)
MRI ATTEMPTED PT WOULD NOT FIT IN SCANNER AND SHE ALSO REFUSED EXAM UPON MOVING PT TO SCANNER TABLE, COMPLAINED OF SEVERE BACK PAIN, AND UNABLE TO LAY FLAT

## 2016-05-01 NOTE — Consult Note (Signed)
[place] Nurse wound consult note Reason for Consult:Bialteral lymphedema (chronic), no wounds. Patient was seen at the outpatient wound care center in [facility]; they assisted her to obtain lymphedema pumps very recently and discharged her.  She used the pumps only one time at home, she reports that her motorized recliner is no longer working. She reports that she ordered a new recliner chair and that it will be delivered in 3 weeks. Wound type:No open wounds Pressure Ulcer POA: No Measurement:N/A Wound bed:N/A Drainage (amount, consistency, odor) None Periwound: N/A Dressing procedure/placement/frequency: We do not provide compression wrapping in acute care, her LEs would need to be wrapped from toe to hip.  She will have less edema here since she will be in bed. She discussed that she is being considered for outpatient lymphedema clinic at [facility]:  They should be notified that patient already has lymphedema pumps. I have asked Nursing to cleanse and moisturize her buttocks and LEs, also to float the heels so that we do not get a pressure injury in house. Patient should follow up with either the [place] lymphedema or [facility] wound clinic should she sustain a wound or require additional instruction on the use of the pumps.   [place] nursing team will not follow, but will remain available to this patient, the nursing and medical teams.  Please re-consult if needed. Thanks, Maudie Flakes, MSN, RN, [place], Arther Abbott  Pager# [phone]

## 2016-05-01 NOTE — Progress Notes (Signed)
LCSWA met with patient, she is agreeable to SNF at this time. Faxed out clinicals to SNF. CSW will follow up with patient in the a.m.

## 2016-05-01 NOTE — Progress Notes (Signed)
PT Cancellation Note  Patient Details Name: Analya Louissaint MRN: [id number] DOB: June 16, 1946   Cancelled Treatment:     MRI @ [facility].  Will check back another day as schedule permits.  Pt has been evaluated by LPT rec SNF   Rica Koyanagi  PTA [facility]  Acute  Rehab Pager      [phone]

## 2016-05-01 NOTE — Progress Notes (Signed)
PROGRESS NOTE    Jodi Underwood  FIE:[id number] DOB: 1946-05-18 DOA: 04/27/2016 PCP: Antony Blackbird, MD    Brief Narrative: Jodi Underwood is a 70 y.o. female with medical history significant of chronic lymphedema, morbid obesity, htn comes in from home where she lives alone for worsening leg swelling and suprapubic pain.   Assessment & Plan:   Principal Problem:   UTI (urinary tract infection) Active Problems:   DM type 2, uncontrolled, with neuropathy ([place])   Morbid obesity ([place])   Essential hypertension   Asthma   Intractable lower abdominal pain   Pressure injury of skin  Lymphadema of the lower extremities, with mild to mod cellulitis: - started on IV ancef.  - she was started on IV lasix, but had to be discontinued for worsening creatinine. Creatinine stabilized , lasix restarted.  - Venous duplex negative for DVT.  - cellulitis improving.  - plan to change to oral antibiotics in am.   UTI: Cultures done, on IV ancef. Cultures show multiple bacterial morphotypes, repeat cultures will be sent.  Added pyridium and robaxin for bladder spasms, pt reports the spasms are better.    Diabetes mellitus: CBG (last 3)   Recent Labs  05/01/16 0742 05/01/16 1245 05/01/16 1732  GLUCAP 149* 137* 126*    Resume SSI.  hgba1c 6.6   Lower abdominal pain: Probably sec to constipation vs bladder spasms.  Stool softeners ordered . Resolved.   Abnormal CT abd showing hypodense mass on the left kidney and lucency on t9. MRI abd and t spine ordered, pt couldn't fit in the machine and she refused in the end. Meanwhile we will schedule it as outpatient.    Acute kidney injury; Probably from lasix.   Stopped IV lasix, and creatinine improved. Restarted lasix at a lower dose orally.   Chronic lymphadema of the legs: lymphadema wraps.  And outpatient clinic appt will be scheduled on discharged.    DVT prophylaxis: (lovenox.  Code Status: (Full) Family Communication:  none at bedside.  Disposition Plan: SNF tomorrow.    Consultants:   None.    Procedures: none.    Antimicrobials:  IV ancef.    Subjective: abd cramps improved. Leg pain has improved.   Objective: Vitals:   04/30/16 2117 05/01/16 0604 05/01/16 1118 05/01/16 1536  BP: (!) 171/73 (!) 158/69 (!) 150/79 (!) 133/52  Pulse: 75 90 83 74  Resp: 16 16  16   Temp: 98.6 F (37 C) 99.1 F (37.3 C)  98 F (36.7 C)  TempSrc: Oral Oral  Oral  SpO2: 96% 95%  95%  Weight:      Height:        Intake/Output Summary (Last 24 hours) at 05/01/16 1749 Last data filed at 05/01/16 1458  Gross per 24 hour  Intake              595 ml  Output             3750 ml  Net            -3155 ml   Filed Weights   04/27/16 1604 04/28/16 0029  Weight: (!) 181.4 kg (400 lb) (!) 200.5 kg (442 lb)    Examination:  General exam: Appears calm and comfortable  Respiratory system: Clear to auscultation. Respiratory effort normal. Cardiovascular system: S1 & S2 heard, RRR. No JVD, murmurs, rubs, gallops or clicks. No pedal edema. Gastrointestinal system: Abdomen is nondistended, soft and nontender. No organomegaly or masses felt. Normal bowel sounds heard.  Central nervous system: Alert and oriented. No focal neurological deficits. Extremities: Symmetric 5 x 5 power.bilateral lower extremity lymphadema. Redness and tenderness improved.   Psychiatry: Judgement and insight appear normal. Mood & affect appropriate.     Data Reviewed: I have personally reviewed following labs and imaging studies  CBC:  Recent Labs Lab 04/27/16 1632 04/29/16 0421  WBC 9.1 8.5  NEUTROABS 5.9  --   HGB 11.8* 10.5*  HCT 38.8 34.8*  MCV 86.8 87.2  PLT 290 099   Basic Metabolic Panel:  Recent Labs Lab 04/27/16 1632 04/29/16 0421 04/30/16 0347  NA 139 137 137  K 4.4 3.8 3.8  CL 106 99* 99*  CO2 26 31 32  GLUCOSE 119* 117* 139*  BUN 16 15 14   CREATININE 0.94 1.19* 1.02*  CALCIUM 8.7* 8.5* 8.6*    GFR: Estimated Creatinine Clearance: 92.5 mL/min (by C-G formula based on SCr of 1.02 mg/dL (H)). Liver Function Tests:  Recent Labs Lab 04/27/16 1632  AST 24  ALT 16  ALKPHOS 81  BILITOT 0.3  PROT 6.5  ALBUMIN 3.2*    Recent Labs Lab 04/27/16 1632  LIPASE 26   No results for input(s): AMMONIA in the last 168 hours. Coagulation Profile: No results for input(s): INR, PROTIME in the last 168 hours. Cardiac Enzymes:  Recent Labs Lab 04/27/16 1632  TROPONINI 0.03*   BNP (last 3 results) No results for input(s): PROBNP in the last 8760 hours. HbA1C:  Recent Labs  04/30/16 0347  HGBA1C 6.6*   CBG:  Recent Labs Lab 04/30/16 1729 04/30/16 2107 05/01/16 0742 05/01/16 1245 05/01/16 1732  GLUCAP 111* 141* 149* 137* 126*   Lipid Profile: No results for input(s): CHOL, HDL, LDLCALC, TRIG, CHOLHDL, LDLDIRECT in the last 72 hours. Thyroid Function Tests: No results for input(s): TSH, T4TOTAL, FREET4, T3FREE, THYROIDAB in the last 72 hours. Anemia Panel: No results for input(s): VITAMINB12, FOLATE, FERRITIN, TIBC, IRON, RETICCTPCT in the last 72 hours. Sepsis Labs: No results for input(s): PROCALCITON, LATICACIDVEN in the last 168 hours.  Recent Results (from the past 240 hour(s))  Urine culture     Status: Abnormal   Collection Time: 04/27/16  8:41 PM  Result Value Ref Range Status   Specimen Description URINE, CLEAN CATCH  Final   Special Requests NONE  Final   Culture MULTIPLE SPECIES PRESENT, SUGGEST RECOLLECTION (A)  Final   Report Status 04/29/2016 FINAL  Final         Radiology Studies: No results found.      Scheduled Meds: . ALPRAZolam  0.5 mg Oral Once  .  ceFAZolin (ANCEF) IV  2 g Intravenous Q8H  . enoxaparin (LOVENOX) injection  100 mg Subcutaneous Q24H  . furosemide  40 mg Oral Daily  . insulin aspart  0-9 Units Subcutaneous TID WC  . insulin aspart  8 Units Subcutaneous TID WC  . metoprolol tartrate  25 mg Oral BID  .  phenazopyridine  200 mg Oral TID WC  . sodium chloride flush  3 mL Intravenous Q12H   Continuous Infusions:    LOS: 3 days    Time spent: 25 minutes.     [name], MD [facility] Pager [phone]  If 7PM-7AM, please contact night-coverage [email or web address] Password [email or web address] 05/01/2016, 5:49 PM

## 2016-05-01 NOTE — NC FL2 (Signed)
[place] LEVEL OF CARE SCREENING TOOL     IDENTIFICATION  Patient Name: Jodi Underwood Birthdate: 06/06/1946 Sex: female Admission Date (Current Location): 04/27/2016  [facility] and [place] Number:  Herbalist and Address:  [facility],  [place] [address], [place]      Provider Number: [id number]  Attending Physician Name and Address:  [name], MD  Relative Name and Phone Number:       Current Level of Care: Hospital Recommended Level of Care: [place] Prior Approval Number:    Date Approved/Denied:   PASRR Number:    Discharge Plan: SNF    Current Diagnoses: Patient Active Problem List   Diagnosis Date Noted  . Pressure injury of skin 04/28/2016  . Intractable lower abdominal pain 04/27/2016  . Constipation   . Respiratory distress   . Acute cystitis without hematuria   . Sepsis ([place]) 09/26/2014  . UTI (urinary tract infection) 09/26/2014  . Asthma 09/26/2014  . Asthma with acute exacerbation   . Blood poisoning ([place])   . Urinary tract infectious disease   . Diabetes type 2, uncontrolled ([place])   . Acute on chronic renal failure ([place])   . Essential hypertension, benign   . Acute renal failure ([place]) 01/25/2013  . Cellulitis of leg, bilateral 01/23/2013  . Ulcers of both lower extremities ([place]) 01/23/2013  . Lymphedema 06/13/2011  . DM type 2, uncontrolled, with neuropathy ([place]) 06/23/2010  . Morbid obesity ([place]) 06/23/2010  . ANEMIA-NOS 06/23/2010  . Essential hypertension 06/23/2010  . Asthma 06/23/2010    Orientation RESPIRATION BLADDER Height & Weight     Self, Time, Situation, Place  Normal Continent Weight: (!) 442 lb (200.5 kg) Height:  5\' 6"  (167.6 cm)  BEHAVIORAL SYMPTOMS/MOOD NEUROLOGICAL BOWEL NUTRITION STATUS      Incontinent  (Healthy Carb Modified)  AMBULATORY STATUS COMMUNICATION OF NEEDS Skin   Extensive Assist Verbally PU Stage and Appropriate Care (Partial thickness  of dermis presenting as a shallow open ulcer with a red pink wound bed with without slough small circular 1cmx 1cm)   PU Stage 2 Dressing:  (PRN)                   Personal Care Assistance Level of Assistance  Bathing, Feeding, Dressing Bathing Assistance: Maximum assistance Feeding assistance: Independent Dressing Assistance: Maximum assistance     Functional Limitations Info  Sight, Hearing, Speech Sight Info: Adequate Hearing Info: Adequate Speech Info: Adequate    SPECIAL CARE FACTORS FREQUENCY  PT (By licensed PT)     PT Frequency: 5              Contractures Contractures Info: Present    Additional Factors Info  Code Status Code Status Info: No kown allergies              Current Medications (05/01/2016):  This is the current hospital active medication list Current Facility-Administered Medications  Medication Dose Route Frequency Provider Last Rate Last Dose  . 0.9 %  sodium chloride infusion  250 mL Intravenous PRN Phillips Grout, MD      . acetaminophen (TYLENOL) tablet 650 mg  650 mg Oral Q6H PRN Gardiner Barefoot, NP      . ALPRAZolam Duanne Moron) tablet 0.5 mg  0.5 mg Oral Once Jani Gravel, MD      . alum & mag hydroxide-simeth (MAALOX/MYLANTA) 200-200-20 MG/5ML suspension 30 mL  30 mL Oral Q6H PRN [name], MD      .  ceFAZolin (ANCEF) IVPB 2g/100 mL premix  2 g Intravenous Q8H [name], MD   2 g at 05/01/16 1523  . enoxaparin (LOVENOX) injection 100 mg  100 mg Subcutaneous Q24H [name], MD   100 mg at 04/30/16 1949  . furosemide (LASIX) tablet 40 mg  40 mg Oral Daily [name], MD   40 mg at 05/01/16 1114  . insulin aspart (novoLOG) injection 0-9 Units  0-9 Units Subcutaneous TID WC Phillips Grout, MD   1 Units at 05/01/16 1325  . insulin aspart (novoLOG) injection 8 Units  8 Units Subcutaneous TID WC Phillips Grout, MD   8 Units at 04/29/16 1838  . methocarbamol (ROBAXIN) 500 mg in dextrose 5 % 50 mL IVPB  500 mg Intravenous Q6H PRN  [name], MD   500 mg at 04/29/16 1840  . metoprolol tartrate (LOPRESSOR) tablet 25 mg  25 mg Oral BID Phillips Grout, MD   25 mg at 05/01/16 1114  . phenazopyridine (PYRIDIUM) tablet 200 mg  200 mg Oral TID WC Charlesetta Shanks, MD   200 mg at 05/01/16 1114  . sodium chloride flush (NS) 0.9 % injection 3 mL  3 mL Intravenous Q12H Phillips Grout, MD   3 mL at 04/29/16 2200  . sodium chloride flush (NS) 0.9 % injection 3 mL  3 mL Intravenous PRN Phillips Grout, MD      . traMADol Veatrice Bourbon) tablet 100 mg  100 mg Oral Q6H PRN [name], MD   100 mg at 05/01/16 0759  . zolpidem (AMBIEN) tablet 5 mg  5 mg Oral QHS PRN Gardiner Barefoot, NP   5 mg at 05/01/16 0114     Discharge Medications: Please see discharge summary for a list of discharge medications.  Relevant Imaging Results:  Relevant Lab Results:   Additional Information ss# [id number]  Lia Hopping, LCSW

## 2016-05-01 NOTE — Progress Notes (Signed)
MRI set up for 0900 @ [facility] schedules for 0830 pickup.

## 2016-05-02 DIAGNOSIS — R935 Abnormal findings on diagnostic imaging of other abdominal regions, including retroperitoneum: Secondary | ICD-10-CM

## 2016-05-02 DIAGNOSIS — N3000 Acute cystitis without hematuria: Secondary | ICD-10-CM

## 2016-05-02 DIAGNOSIS — K5900 Constipation, unspecified: Secondary | ICD-10-CM

## 2016-05-02 DIAGNOSIS — I890 Lymphedema, not elsewhere classified: Secondary | ICD-10-CM

## 2016-05-02 LAB — GLUCOSE, CAPILLARY
GLUCOSE-CAPILLARY: 158 mg/dL — AB (ref 65–99)
Glucose-Capillary: 140 mg/dL — ABNORMAL HIGH (ref 65–99)
Glucose-Capillary: 121 mg/dL — ABNORMAL HIGH (ref 65–99)
Glucose-Capillary: 107 mg/dL — ABNORMAL HIGH (ref 65–99)

## 2016-05-02 MED ORDER — CEPHALEXIN 500 MG PO CAPS: 500.0000 mg | ORAL_CAPSULE | Freq: Two times a day (BID) | ORAL | 0 refills | Status: DC

## 2016-05-02 MED ORDER — CEPHALEXIN 500 MG PO CAPS
500.0000 mg | ORAL_CAPSULE | Freq: Two times a day (BID) | ORAL | Status: DC
  Administered 2016-05-02 – 2016-05-03 (×2): 500 mg via ORAL
  Filled 2016-05-02 (×2): qty 1

## 2016-05-02 NOTE — Care Management Note (Signed)
Case Management Note  Patient Details  Name: Jodi Underwood MRN: [id number] Date of Birth: 1946/01/16    Expected Discharge Date:                  Expected Discharge Plan:  [place]  In-House Referral:  Clinical Social Work  Discharge planning Services  CM Consult  Post Acute Care Choice:    Choice offered to:     DME Arranged:    DME Agency:     HH Arranged:    [place] Agency:     Status of Service:  In process, will continue to follow  If discussed at Long Length of Stay Meetings, dates discussed:    Additional Comments: DC plan has changed from home with home health to SNF. [facility] rep made aware.  Lynnell Catalan, RN 05/02/2016, 10:45 AM  [phone]

## 2016-05-02 NOTE — Care Management Important Message (Signed)
Important Message  Patient Details  Name: Jodi Underwood MRN: [id number] Date of Birth: 01-23-1946   Medicare Important Message Given:  Yes    [name] 05/02/2016, 11:07 AMImportant Message  Patient Details  Name: Jodi Underwood MRN: [id number] Date of Birth: Sep 20, 1945   Medicare Important Message Given:  Yes    [name] 05/02/2016, 11:07 AM

## 2016-05-02 NOTE — Progress Notes (Signed)
Physical Therapy Treatment Patient Details Name: Jodi Underwood MRN: [id number] DOB: Dec 05, 1945 Today's Date: 05/02/2016    History of Present Illness 70 y.o. female admitted with UTI, BLE lymphedema. PMH of DM2, asthma, HTN, morbid obesity, chronic lymphedema BLEs    PT Comments    Assisted pt OOB to recliner.  Performed well considering pt has not been OOB since admission.  Pt able to to get self to EOB and able to stand and turn to recliner supporting self.  Will attempt amb next visit.    Follow Up Recommendations  SNF     Equipment Recommendations       Recommendations for Other Services       Precautions / Restrictions Precautions Precautions: Fall Restrictions Weight Bearing Restrictions: No    Mobility  Bed Mobility Overal bed mobility: Needs Assistance Bed Mobility: Rolling;Supine to Sit Rolling: Mod assist;Max assist   Supine to sit: Max assist     General bed mobility comments: pt able to assist using bed rails.  side to side rolling to use bed pan and supine to sit to EOB required assist for L LE and assist for upper body.  Otherwise, performed well.    Transfers Overall transfer level: Needs assistance Equipment used: Rolling walker (2 wheeled) Transfers: Sit to/from [facility] Sit to Stand: Min guard;Min assist Stand pivot transfers: Min guard;Min assist       General transfer comment: using forward momentum pt was able to self rise from bed using bariatric walker to steady self and able to weigh shift and support self upright to complete 1/4 turn to recliner.  VC's for direction only.  completed control desend.    Ambulation/Gait             General Gait Details: will attempt next session   Stairs            Wheelchair Mobility    Modified Rankin (Stroke Patients Only)       Balance                                    Cognition Arousal/Alertness: Awake/alert Behavior During Therapy: WFL  for tasks assessed/performed Overall Cognitive Status: Within Functional Limits for tasks assessed                      Exercises      General Comments        Pertinent Vitals/Pain Pain Assessment: Faces Faces Pain Scale: Hurts a little bit Pain Location: L LE Pain Descriptors / Indicators: Tender Pain Intervention(s): Monitored during session;Repositioned    Home Living                      Prior Function            PT Goals (current goals can now be found in the care plan section) Progress towards PT goals: Progressing toward goals    Frequency    Min 3X/week      PT Plan Current plan remains appropriate    Co-evaluation             End of Session   Activity Tolerance: Patient tolerated treatment well Patient left: in chair;with chair alarm set     Time: 1225-1250 PT Time Calculation (min) (ACUTE ONLY): 25 min  Charges:  $Therapeutic Activity: 23-37 mins  G Codes:      Rica Koyanagi  PTA [facility]  Acute  Rehab pager      [phone]

## 2016-05-02 NOTE — Discharge Summary (Signed)
Physician Discharge Summary  Jodi Underwood KXF:[id number] DOB: Mar 06, 1946 DOA: 04/27/2016  PCP: No primary care provider on file.  Admit date: 04/27/2016 Discharge date: 05/03/2016  Admitted From: Home.  Disposition:  snf when bed available.   Recommendations for Outpatient Follow-up:  1. Follow up with PCP in 1-2 weeks 2. Please obtain BMP/CBC in one week 3. Please follow up with lymphadema clinic in one week.  4. Please get MRI of the thoracic spine and abdomen for evaluation of hypodense area on the kidney and lucency over the thoracic vertebra.     Discharge Condition:stable.  CODE STATUS:full code. ) Diet recommendation: Heart Healthy / Carb Modified    Brief/Interim Summary: Jodi Underwood a 70 y.o.femalewith medical history significant of chronic lymphedema, morbid obesity, htn comes in from home where she lives alone for worsening leg swelling and suprapubic pain.  Discharge Diagnoses:  Principal Problem:   UTI (urinary tract infection) Active Problems:   DM type 2, uncontrolled, with neuropathy ([place])   Morbid obesity ([place])   Essential hypertension   Asthma   Intractable lower abdominal pain   Pressure injury of skin   Lymphadema of the lower extremities, with mild to mod cellulitis: - started on IV ancef.  - she was started on IV lasix, but had to be discontinued for worsening creatinine. Creatinine stabilized , lasix restarted.  - Venous duplex negative for DVT.  - cellulitis improving.  -change to oral in am.   UTI: Cultures done, on IV ancef. Cultures show multiple species.  Added pyridium and robaxin for bladder spasms, pt reports the spasms are better.    Diabetes mellitus: CBG (last 3)   Recent Labs (last 2 labs)    Recent Labs  05/01/16 0742 05/01/16 1245 05/01/16 1732  GLUCAP 149* 137* 126*      Resume SSI.  hgba1c 6.6   Lower abdominal pain: Probably sec to constipation vs bladder spasms.  Stool softeners  ordered . Resolved.   Abnormal CT abd showing hypodense mass on the left kidney and lucency on t9. MRI abd and t spine ordered, pt couldn't fit in the machine and she refused in the end. Meanwhile we will schedule it as outpatient.    Acute kidney injury; Probably from lasix.   Stopped IV lasix, and creatinine improved. Restarted lasix at a lower dose orally.   Chronic lymphadema of the legs: lymphadema wraps.  And outpatient clinic appt to be scheduled on discharge.      Discharge Instructions  Discharge Instructions    Discharge instructions    Complete by:  As directed    Follow up with PCP in one week.  Please follow up with lymphadema clinic.       Medication List    STOP taking these medications   naproxen sodium 220 MG tablet Commonly known as:  ANAPROX     TAKE these medications   albuterol 108 (90 Base) MCG/ACT inhaler Commonly known as:  PROVENTIL HFA;VENTOLIN HFA Inhale 2 puffs into the lungs every 6 (six) hours as needed.   carbamazepine 300 MG 12 hr capsule Commonly known as:  CARBATROL Take 1 capsule by mouth 2 (two) times daily.   cephALEXin 500 MG capsule Commonly known as:  KEFLEX Take 1 capsule (500 mg total) by mouth every 12 (twelve) hours.   furosemide 40 MG tablet Commonly known as:  LASIX Take 1 tablet (40 mg total) by mouth daily.   insulin aspart 100 UNIT/ML injection Commonly known as:  novoLOG Inject 8 Units  into the skin 3 (three) times daily with meals.   metoprolol tartrate 25 MG tablet Commonly known as:  LOPRESSOR Take 1 tablet (25 mg total) by mouth 2 (two) times daily.   NOVOLIN N RELION 100 UNIT/ML injection Generic drug:  insulin NPH Human Inject 50 Units into the skin 2 (two) times daily before a meal.   traMADol 50 MG tablet Commonly known as:  ULTRAM Take 2 tablets (100 mg total) by mouth every 6 (six) hours as needed for severe pain.   valsartan 80 MG tablet Commonly known as:  DIOVAN Take 1 tablet by  mouth daily.      Follow-up Information    Schedule an appointment as soon as possible for a visit today with FULP, CAMMIE, MD.   Specialty:  Family Medicine Why:  Call for appointment in next 3-5 days. Contact information: [address] [address] [phone]        [facility] .   Why:  [facility] Contact information: [place] [address] [phone]        Please follow up.   Why:  Pt needs to call [facility] for an appt to be sen re: lymphadema [phone]         No Known Allergies  Consultations:  none   Procedures/Studies: Ct Abdomen Pelvis W Contrast  Result Date: 04/27/2016 CLINICAL DATA:  Constipation and possible UTI EXAM: CT ABDOMEN AND PELVIS WITH CONTRAST TECHNIQUE: Multidetector CT imaging of the abdomen and pelvis was performed using the standard protocol following bolus administration of intravenous contrast. CONTRAST:  173mL ISOVUE-300 IOPAMIDOL (ISOVUE-300) INJECTION 61% COMPARISON:  09/26/2014 radiograph FINDINGS: Lower chest: Mild areas of linear atelectasis in the lingula and bilateral lower lobes. No acute consolidation or pleural effusion at the lung bases. The heart does not appear enlarged. Mitral annular calcifications are present. There are partially visualize coronary artery calcifications. Hepatobiliary: No focal hepatic abnormality is visualized. No calcified gallstones. No intra or extrahepatic biliary dilatation. Pancreas: Unremarkable. No pancreatic ductal dilatation or surrounding inflammatory changes. Spleen: Normal in size without focal abnormality. Adrenals/Urinary Tract: Bilateral adrenal glands are within normal limits. 1 cm cortical hypodense lesion in the lower pole of right kidney. Vague hypodense mass within the upper pole of the left kidney measuring approximately 3.4 by 2.8 cm, series 2, image number 31. This is better seen on delayed images were excreted contrast is  being displaced by the mass. No hydronephrosis. Urinary bladder is distended. Stomach/Bowel: The stomach is nondilated. There is no dilated small bowel to suggest an obstruction. Moderate stool in the colon. Appendix is not well visualized. Vascular/Lymphatic: Scattered atherosclerotic vascular calcifications. No significantly enlarged retroperitoneal or mesenteric lymph nodes. Reproductive: Non visualized and presumed surgically absent. Other: No free air or free fluid. Large infraumbilical ventral hernia containing only fat. No herniated bowel loops. Musculoskeletal: Advanced multilevel degenerative changes of the thoracolumbar spine with multilevel lumbar canal stenosis. Questionable lucent lesion in the anterior aspect of T9 vertebral body. IMPRESSION: 1. No CT evidence for bowel obstruction, free-air, or free-fluid. 2. Vague hypodense indeterminate mass within the upper pole of the left kidney, recommend further evaluation with non emergent MRI. 3. Infra umbilical fat containing ventral hernia. 4. Questionable lucent lesion anterior aspect of T9. Nonemergent MRI may be considered for further evaluation. Electronically Signed   By: Donavan Foil M.D.   On: 04/27/2016 18:58   Dg Chest Port 1 View  Result Date: 04/27/2016 CLINICAL DATA:  Abdominal pain and lower extremity swelling. Asthma, diabetes, and hypertension. EXAM: PORTABLE CHEST 1 VIEW COMPARISON:  09/27/2014 FINDINGS: Patient is partially rotated to the right. Heart size is stable. Both lungs are clear. No pneumothorax or pleural effusion identified. IMPRESSION: No active disease. Electronically Signed   By: Earle Gell M.D.   On: 04/27/2016 18:14      Subjective: No new compaints  Discharge Exam: Vitals:   05/02/16 0431 05/02/16 1255  BP: (!) 147/70 (!) 153/73  Pulse: 76 76  Resp: 16 18  Temp: 98 F (36.7 C) 98.3 F (36.8 C)   Vitals:   05/01/16 1536 05/01/16 2045 05/02/16 0431 05/02/16 1255  BP: (!) 133/52 (!) 150/60 (!) 147/70  (!) 153/73  Pulse: 74 80 76 76  Resp: 16 16 16 18   Temp: 98 F (36.7 C) 98.8 F (37.1 C) 98 F (36.7 C) 98.3 F (36.8 C)  TempSrc: Oral Oral Oral Oral  SpO2: 95% 97% 98% 96%  Weight:      Height:        General: Pt is alert, awake, not in acute distress Cardiovascular: RRR, S1/S2 +, no rubs, no gallops Respiratory: CTA bilaterally, no wheezing, no rhonchi Abdominal: Soft, NT, ND, bowel sounds + Extremities: no edema, no cyanosis    The results of significant diagnostics from this hospitalization (including imaging, microbiology, ancillary and laboratory) are listed below for reference.     Microbiology: Recent Results (from the past 240 hour(s))  Urine culture     Status: Abnormal   Collection Time: 04/27/16  8:41 PM  Result Value Ref Range Status   Specimen Description URINE, CLEAN CATCH  Final   Special Requests NONE  Final   Culture MULTIPLE SPECIES PRESENT, SUGGEST RECOLLECTION (A)  Final   Report Status 04/29/2016 FINAL  Final     Labs: BNP (last 3 results)  Recent Labs  04/27/16 1632  BNP 37.1   Basic Metabolic Panel:  Recent Labs Lab 04/27/16 1632 04/29/16 0421 04/30/16 0347  NA 139 137 137  K 4.4 3.8 3.8  CL 106 99* 99*  CO2 26 31 32  GLUCOSE 119* 117* 139*  BUN 16 15 14   CREATININE 0.94 1.19* 1.02*  CALCIUM 8.7* 8.5* 8.6*   Liver Function Tests:  Recent Labs Lab 04/27/16 1632  AST 24  ALT 16  ALKPHOS 81  BILITOT 0.3  PROT 6.5  ALBUMIN 3.2*    Recent Labs Lab 04/27/16 1632  LIPASE 26   No results for input(s): AMMONIA in the last 168 hours. CBC:  Recent Labs Lab 04/27/16 1632 04/29/16 0421  WBC 9.1 8.5  NEUTROABS 5.9  --   HGB 11.8* 10.5*  HCT 38.8 34.8*  MCV 86.8 87.2  PLT 290 293   Cardiac Enzymes:  Recent Labs Lab 04/27/16 1632  TROPONINI 0.03*   BNP: Invalid input(s): POCBNP CBG:  Recent Labs Lab 05/01/16 1732 05/01/16 2107 05/02/16 0730 05/02/16 1258 05/02/16 1709  GLUCAP 126* 148* 140* 158*  121*   D-Dimer No results for input(s): DDIMER in the last 72 hours. Hgb A1c  Recent Labs  04/30/16 0347  HGBA1C 6.6*   Lipid Profile No results for input(s): CHOL, HDL, LDLCALC, TRIG, CHOLHDL, LDLDIRECT in the last 72 hours. Thyroid function studies No results for input(s): TSH, T4TOTAL, T3FREE, THYROIDAB in the last 72 hours.  Invalid input(s): FREET3 Anemia work up No results for input(s): VITAMINB12, FOLATE, FERRITIN, TIBC, IRON, RETICCTPCT in the last 72 hours. Urinalysis    Component Value Date/Time  COLORURINE YELLOW 04/27/2016 2027   APPEARANCEUR CLOUDY (A) 04/27/2016 2027   LABSPEC 1.017 04/27/2016 2027   PHURINE 6.0 04/27/2016 2027   GLUCOSEU NEGATIVE 04/27/2016 2027   HGBUR MODERATE (A) 04/27/2016 2027   BILIRUBINUR NEGATIVE 04/27/2016 2027   KETONESUR NEGATIVE 04/27/2016 2027   PROTEINUR 30 (A) 04/27/2016 2027   UROBILINOGEN 0.2 09/29/2014 1224   NITRITE POSITIVE (A) 04/27/2016 2027   LEUKOCYTESUR MODERATE (A) 04/27/2016 2027   Sepsis Labs Invalid input(s): PROCALCITONIN,  WBC,  LACTICIDVEN Microbiology Recent Results (from the past 240 hour(s))  Urine culture     Status: Abnormal   Collection Time: 04/27/16  8:41 PM  Result Value Ref Range Status   Specimen Description URINE, CLEAN CATCH  Final   Special Requests NONE  Final   Culture MULTIPLE SPECIES PRESENT, SUGGEST RECOLLECTION (A)  Final   Report Status 04/29/2016 FINAL  Final     Time coordinating discharge: Over 30 minutes  SIGNED:   [name], MD  [facility] 05/02/2016, 7:24 PM Pager   If 7PM-7AM, please contact night-coverage [email or web address] Password [email or web address]

## 2016-05-03 DIAGNOSIS — E1165 Type 2 diabetes mellitus with hyperglycemia: Secondary | ICD-10-CM

## 2016-05-03 DIAGNOSIS — I890 Lymphedema, not elsewhere classified: Secondary | ICD-10-CM

## 2016-05-03 DIAGNOSIS — E1140 Type 2 diabetes mellitus with diabetic neuropathy, unspecified: Secondary | ICD-10-CM

## 2016-05-03 DIAGNOSIS — R935 Abnormal findings on diagnostic imaging of other abdominal regions, including retroperitoneum: Secondary | ICD-10-CM

## 2016-05-03 LAB — CBC
HCT: 35 % — ABNORMAL LOW (ref 36.0–46.0)
Hemoglobin: 10.9 g/dL — ABNORMAL LOW (ref 12.0–15.0)
MCH: 26.3 pg (ref 26.0–34.0)
MCHC: 31.1 g/dL (ref 30.0–36.0)
MCV: 84.5 fL (ref 78.0–100.0)
PLATELETS: 278 10*3/uL (ref 150–400)
RBC: 4.14 MIL/uL (ref 3.87–5.11)
RDW: 13 % (ref 11.5–15.5)
WBC: 9.8 10*3/uL (ref 4.0–10.5)

## 2016-05-03 LAB — GLUCOSE, CAPILLARY
GLUCOSE-CAPILLARY: 133 mg/dL — AB (ref 65–99)
Glucose-Capillary: 142 mg/dL — ABNORMAL HIGH (ref 65–99)
Glucose-Capillary: 165 mg/dL — ABNORMAL HIGH (ref 65–99)

## 2016-05-03 MED ORDER — CARBAMAZEPINE ER 300 MG PO CP12
300.0000 mg | ORAL_CAPSULE | Freq: Two times a day (BID) | ORAL | Status: DC
  Filled 2016-05-03 (×2): qty 1

## 2016-05-03 NOTE — Progress Notes (Signed)
Nurse in room to give report to oncoming nurse to find that patient has left facility with daughter. CNA states that patients daughter rolled patient out in wheelchair without notifying nurse.

## 2016-05-03 NOTE — Progress Notes (Signed)
Discharge instructions reviewed with patient. Patient verbalized understanding. Patient awaiting daughters arrival for pick up.

## 2016-05-03 NOTE — Progress Notes (Addendum)
CSW assisting with d/c planning. At present, pt has no SNF bed offers. CSW will contact SNF's that have not yet responded to referral request in hopes of d/c to SNF today.  Werner Lean LCSW [phone]  10:50  Pt has one  bed offer at [facility]. SNF is unable to provide lymphedema wraps. Pt is considering retuning home with [facility] services if she can continue with PT as well as lymphedema wraps at home. RNCM is trying to arrange services. CSW  Will continue to follow until disposition has been determined.  Werner Lean LCSW [phone]

## 2016-05-03 NOTE — Discharge Summary (Addendum)
Physician Discharge Summary  Jodi Underwood OXB:[id number] DOB: 02-Oct-1945 DOA: 04/27/2016  PCP: No primary care provider on file.  Admit date: 04/27/2016 Discharge date: 05/03/2016  Admitted From: Home.  Disposition: Home with home health  Recommendations for Outpatient Follow-up:  1. Follow up with PCP in 1-2 weeks 2. Please obtain BMP/CBC in one week 3. Please follow up with lymphedema clinic in one week.  4. Please get MRI of the thoracic spine and abdomen for evaluation of hypodense area on the kidney and lucency over the thoracic vertebra. Was unable to get this at [facility] due to inability to fit in MRI.  Discharge Condition: stable  CODE STATUS: full code Diet recommendation: Heart Healthy / Carb Modified    Brief/Interim Summary: Jodi Underwood a 70 y.o.femalewith medical history significant of chronic lymphedema, morbid obesity, htn comes in from home where she lives alone for worsening leg swelling and suprapubic pain. She was treated for UTI with ancef, then keflex. Completes 7 days tx 10/18. Initially planned to discharge to SNF, though no facilities were able to provide wrapping of legs for lymphedema. Home health RN has been able to do this in the past, though this service was stopped which is thought to have led to this admission. RN CM has been able to restart [facility] services, so pt will be discharged home with assistance from family.   Discharge Diagnoses:  Principal Problem:   UTI (urinary tract infection) Active Problems:   DM type 2, uncontrolled, with neuropathy ([place])   Morbid obesity ([place])   Essential hypertension   Asthma   Intractable lower abdominal pain   Pressure injury of skin   Abnormal CT of the abdomen   Lymphadema of the lower extremities, with mild to mod cellulitis: - started on IV ancef.  - she was started on IV lasix, but had to be discontinued for worsening creatinine. Creatinine stabilized , lasix restarted.  - Venous duplex negative  for DVT.  - cellulitis improving.  -change to oral in am.   UTI: Cultures done, on IV ancef. Cultures show multiple species.  Added pyridium and robaxin for bladder spasms, pt reports the spasms are better.   Diabetes mellitus: CBG (last 3)   Recent Labs (last 2 labs)    Recent Labs  05/01/16 0742 05/01/16 1245 05/01/16 1732  GLUCAP 149* 137* 126*      Resume SSI.  hgba1c 6.6   Lower abdominal pain: Probably sec to constipation vs bladder spasms.  Stool softeners ordered . Resolved.   Abnormal CT abd showing hypodense mass on the left kidney and lucency on t9. MRI abd and t spine ordered, pt couldn't fit in the machine and she refused in the end. Meanwhile we will need this scheduled as outpatient.    Acute kidney injury; Probably from lasix.   Stopped IV lasix, and creatinine improved. Restarted lasix at a lower dose orally.   Chronic lymphedema of the legs with stage 2 pressure ulcer: To the best of my knowledge this was present on admission. lymphadema wraps.  And outpatient clinic appt to be scheduled on discharge.   Discharge Instructions  Discharge Instructions    Discharge instructions    Complete by:  As directed    Follow up with PCP in one week.  Please follow up with lymphadema clinic.   Discharge instructions    Complete by:  As directed    Finish the course of keflex until no capsules remain. Otherwise, continue your home medications and call to schedule  follow up with your PCP. - Return if your symptoms worsen.       Medication List    STOP taking these medications   naproxen sodium 220 MG tablet Commonly known as:  ANAPROX     TAKE these medications   albuterol 108 (90 Base) MCG/ACT inhaler Commonly known as:  PROVENTIL HFA;VENTOLIN HFA Inhale 2 puffs into the lungs every 6 (six) hours as needed.   carbamazepine 300 MG 12 hr capsule Commonly known as:  CARBATROL Take 1 capsule by mouth 2 (two) times daily.   cephALEXin  500 MG capsule Commonly known as:  KEFLEX Take 1 capsule (500 mg total) by mouth every 12 (twelve) hours.   furosemide 40 MG tablet Commonly known as:  LASIX Take 1 tablet (40 mg total) by mouth daily.   insulin aspart 100 UNIT/ML injection Commonly known as:  novoLOG Inject 8 Units into the skin 3 (three) times daily with meals.   metoprolol tartrate 25 MG tablet Commonly known as:  LOPRESSOR Take 1 tablet (25 mg total) by mouth 2 (two) times daily.   NOVOLIN N RELION 100 UNIT/ML injection Generic drug:  insulin NPH Human Inject 50 Units into the skin 2 (two) times daily before a meal.   traMADol 50 MG tablet Commonly known as:  ULTRAM Take 2 tablets (100 mg total) by mouth every 6 (six) hours as needed for severe pain.   valsartan 80 MG tablet Commonly known as:  DIOVAN Take 1 tablet by mouth daily.      Follow-up Information    Schedule an appointment as soon as possible for a visit today with FULP, CAMMIE, MD.   Specialty:  Family Medicine Why:  Call for appointment in next 3-5 days. Contact information: [address] [address] [phone]        [facility] .   Why:  [facility] Contact information: [address] [place] [address] [phone]        Please follow up.   Why:  Pt needs to call [facility] for an appt to be sen re: lymphadema [phone]         No Known Allergies  Consultations:  none   Procedures/Studies: Ct Abdomen Pelvis W Contrast  Result Date: 04/27/2016 CLINICAL DATA:  Constipation and possible UTI EXAM: CT ABDOMEN AND PELVIS WITH CONTRAST TECHNIQUE: Multidetector CT imaging of the abdomen and pelvis was performed using the standard protocol following bolus administration of intravenous contrast. CONTRAST:  17mL ISOVUE-300 IOPAMIDOL (ISOVUE-300) INJECTION 61% COMPARISON:  09/26/2014 radiograph FINDINGS: Lower chest: Mild areas of linear atelectasis in the lingula and  bilateral lower lobes. No acute consolidation or pleural effusion at the lung bases. The heart does not appear enlarged. Mitral annular calcifications are present. There are partially visualize coronary artery calcifications. Hepatobiliary: No focal hepatic abnormality is visualized. No calcified gallstones. No intra or extrahepatic biliary dilatation. Pancreas: Unremarkable. No pancreatic ductal dilatation or surrounding inflammatory changes. Spleen: Normal in size without focal abnormality. Adrenals/Urinary Tract: Bilateral adrenal glands are within normal limits. 1 cm cortical hypodense lesion in the lower pole of right kidney. Vague hypodense mass within the upper pole of the left kidney measuring approximately 3.4 by 2.8 cm, series 2, image number 31. This is better seen on delayed images were excreted contrast is being displaced by the mass. No hydronephrosis. Urinary bladder is distended. Stomach/Bowel: The stomach is nondilated. There is no dilated small bowel to suggest an obstruction. Moderate stool  in the colon. Appendix is not well visualized. Vascular/Lymphatic: Scattered atherosclerotic vascular calcifications. No significantly enlarged retroperitoneal or mesenteric lymph nodes. Reproductive: Non visualized and presumed surgically absent. Other: No free air or free fluid. Large infraumbilical ventral hernia containing only fat. No herniated bowel loops. Musculoskeletal: Advanced multilevel degenerative changes of the thoracolumbar spine with multilevel lumbar canal stenosis. Questionable lucent lesion in the anterior aspect of T9 vertebral body. IMPRESSION: 1. No CT evidence for bowel obstruction, free-air, or free-fluid. 2. Vague hypodense indeterminate mass within the upper pole of the left kidney, recommend further evaluation with non emergent MRI. 3. Infra umbilical fat containing ventral hernia. 4. Questionable lucent lesion anterior aspect of T9. Nonemergent MRI may be considered for further  evaluation. Electronically Signed   By: Donavan Foil M.D.   On: 04/27/2016 18:58   Dg Chest Port 1 View  Result Date: 04/27/2016 CLINICAL DATA:  Abdominal pain and lower extremity swelling. Asthma, diabetes, and hypertension. EXAM: PORTABLE CHEST 1 VIEW COMPARISON:  09/27/2014 FINDINGS: Patient is partially rotated to the right. Heart size is stable. Both lungs are clear. No pneumothorax or pleural effusion identified. IMPRESSION: No active disease. Electronically Signed   By: Earle Gell M.D.   On: 04/27/2016 18:14      Subjective: Pt without complaints. No leg pain, fever, dysuria.   Discharge Exam: Vitals:   05/02/16 2131 05/03/16 0705  BP: (!) 143/69 (!) 159/99  Pulse: 77 72  Resp: 18 16  Temp: 98.2 F (36.8 C) 99 F (37.2 C)   Vitals:   05/02/16 0431 05/02/16 1255 05/02/16 2131 05/03/16 0705  BP: (!) 147/70 (!) 153/73 (!) 143/69 (!) 159/99  Pulse: 76 76 77 72  Resp: 16 18 18 16   Temp: 98 F (36.7 C) 98.3 F (36.8 C) 98.2 F (36.8 C) 99 F (37.2 C)  TempSrc: Oral Oral Oral Oral  SpO2: 98% 96% 98% 96%  Weight:      Height:        General: Obese, pleasant female in no acute distress Cardiovascular: RRR, S1/S2 +, no rubs, no gallops Respiratory: CTA bilaterally, no wheezing, no rhonchi Abdominal: Soft, NT, ND, bowel sounds + Extremities: Significant pitting bilaterally symmetrical LE lymphedema. Not taut. Minimal erythema without margins, tenderness, or warmth.   The results of significant diagnostics from this hospitalization (including imaging, microbiology, ancillary and laboratory) are listed below for reference.     Microbiology: Recent Results (from the past 240 hour(s))  Urine culture     Status: Abnormal   Collection Time: 04/27/16  8:41 PM  Result Value Ref Range Status   Specimen Description URINE, CLEAN CATCH  Final   Special Requests NONE  Final   Culture MULTIPLE SPECIES PRESENT, SUGGEST RECOLLECTION (A)  Final   Report Status 04/29/2016 FINAL   Final     Labs: BNP (last 3 results)  Recent Labs  04/27/16 1632  BNP 85.8   Basic Metabolic Panel:  Recent Labs Lab 04/27/16 1632 04/29/16 0421 04/30/16 0347  NA 139 137 137  K 4.4 3.8 3.8  CL 106 99* 99*  CO2 26 31 32  GLUCOSE 119* 117* 139*  BUN 16 15 14   CREATININE 0.94 1.19* 1.02*  CALCIUM 8.7* 8.5* 8.6*   Liver Function Tests:  Recent Labs Lab 04/27/16 1632  AST 24  ALT 16  ALKPHOS 81  BILITOT 0.3  PROT 6.5  ALBUMIN 3.2*    Recent Labs Lab 04/27/16 1632  LIPASE 26   No results for input(s): AMMONIA in  the last 168 hours. CBC:  Recent Labs Lab 04/27/16 1632 04/29/16 0421 05/03/16 0348  WBC 9.1 8.5 9.8  NEUTROABS 5.9  --   --   HGB 11.8* 10.5* 10.9*  HCT 38.8 34.8* 35.0*  MCV 86.8 87.2 84.5  PLT 290 293 278   Cardiac Enzymes:  Recent Labs Lab 04/27/16 1632  TROPONINI 0.03*   BNP: Invalid input(s): POCBNP CBG:  Recent Labs Lab 05/02/16 0730 05/02/16 1258 05/02/16 1709 05/02/16 2128 05/03/16 0802  GLUCAP 140* 158* 121* 107* 133*   D-Dimer No results for input(s): DDIMER in the last 72 hours. Hgb A1c No results for input(s): HGBA1C in the last 72 hours. Lipid Profile No results for input(s): CHOL, HDL, LDLCALC, TRIG, CHOLHDL, LDLDIRECT in the last 72 hours. Thyroid function studies No results for input(s): TSH, T4TOTAL, T3FREE, THYROIDAB in the last 72 hours.  Invalid input(s): FREET3 Anemia work up No results for input(s): VITAMINB12, FOLATE, FERRITIN, TIBC, IRON, RETICCTPCT in the last 72 hours. Urinalysis    Component Value Date/Time   COLORURINE YELLOW 04/27/2016 2027   APPEARANCEUR CLOUDY (A) 04/27/2016 2027   LABSPEC 1.017 04/27/2016 2027   PHURINE 6.0 04/27/2016 2027   GLUCOSEU NEGATIVE 04/27/2016 2027   HGBUR MODERATE (A) 04/27/2016 2027   BILIRUBINUR NEGATIVE 04/27/2016 2027   KETONESUR NEGATIVE 04/27/2016 2027   PROTEINUR 30 (A) 04/27/2016 2027   UROBILINOGEN 0.2 09/29/2014 1224   NITRITE POSITIVE (A)  04/27/2016 2027   LEUKOCYTESUR MODERATE (A) 04/27/2016 2027   Sepsis Labs Invalid input(s): PROCALCITONIN,  WBC,  LACTICIDVEN Microbiology Recent Results (from the past 240 hour(s))  Urine culture     Status: Abnormal   Collection Time: 04/27/16  8:41 PM  Result Value Ref Range Status   Specimen Description URINE, CLEAN CATCH  Final   Special Requests NONE  Final   Culture MULTIPLE SPECIES PRESENT, SUGGEST RECOLLECTION (A)  Final   Report Status 04/29/2016 FINAL  Final     Time coordinating discharge: Over 30 minutes  SIGNED:   Vance Gather, MD  [facility] 05/03/2016, 12:14 PM Pager   If 7PM-7AM, please contact night-coverage [email or web address] Password [email or web address]

## 2016-05-03 NOTE — Progress Notes (Signed)
This CM met with pt at bedside along with CSW. Pt would prefer to go home where she can receive her lymphedema wraps by [facility] along with HHPT. Pt is unable to go to the lymphedema clinic to be wrapped and receive any form of physical therapy at the same time as medicare can not be billed twice for physical therapy. Pt understands this and Arville Go was contacted for resumption of care. This CM will also contact [facility] to see if pt qualifies for outpatient services. Pt states he daughter will pick her up this afternoon.  Marney Doctor RN,BSN,NCM [phone]

## 2016-05-16 ENCOUNTER — Ambulatory Visit: Payer: Medicare Other | Attending: Internal Medicine | Admitting: Physical Therapy

## 2016-05-16 DIAGNOSIS — M6281 Muscle weakness (generalized): Secondary | ICD-10-CM | POA: Diagnosis present

## 2016-05-16 DIAGNOSIS — M79604 Pain in right leg: Secondary | ICD-10-CM | POA: Insufficient documentation

## 2016-05-16 DIAGNOSIS — M79605 Pain in left leg: Secondary | ICD-10-CM | POA: Insufficient documentation

## 2016-05-16 DIAGNOSIS — I890 Lymphedema, not elsewhere classified: Secondary | ICD-10-CM | POA: Diagnosis not present

## 2016-05-16 NOTE — Therapy (Signed)
[place], [place], [address] Phone: [phone]   Fax:  [phone]  Physical Therapy Evaluation  Patient Details  Name: Jodi Underwood MRN: [id number] Date of Birth: 1946-03-26 Referring Provider: Charna Archer  Encounter Date: 05/16/2016      PT End of Session - 05/16/16 1805    Visit Number 1   Number of Visits 9   Date for PT Re-Evaluation 07/16/16  visit will be spread out due to transportation issues    PT Start Time 1430   PT Stop Time 1605   PT Time Calculation (min) 95 min   Activity Tolerance Patient limited by pain   Behavior During Therapy [facility] for tasks assessed/performed      Past Medical History:  Diagnosis Date  . Anemia   . Arthritis   . Asthma   . Degenerative disc disease   . Diabetes mellitus   . Diabetic neuropathy ([place])   . Hypertension   . Lymphedema   . Pneumonia   . Urinary incontinence     Past Surgical History:  Procedure Laterality Date  . ABDOMINAL HYSTERECTOMY     COMPLETE    There were no vitals filed for this visit.       Subjective Assessment - 05/16/16 1447    Subjective "I think I've always had swelling"    Pertinent History 2007 quit walking because of pain in knees and she had difficulty at work walking.  She got a wheelchair at the at time.  2013, was in motorized chair for 8-10 hours a day while she worked.  She developed wounds in her legs. She had care at [facility] for lymphedema and wrapping and get results. She had farrow wraps at one time and had results, but was not able to maintain it.  She has recently got comopression pumps but doesn't know how to use them    Limitations Walking;Standing  for a very short time, takes a few steps    Patient Stated Goals to be able to manage the lymphedema at a level that allows me to have a "normal" life.  pt would love to be able to back to water exercise    Currently in Pain? No/denies  had pain  at night and various times duing the day    Pain Score --  pain in hips and legs when she moves            [facility] PT Assessment - 05/16/16 0001      Assessment   Medical Diagnosis Lymphedema of both extremities    Referring Provider Moogali Arvind   Onset Date/Surgical Date 07/18/11  approx    Prior Therapy 2014     Precautions   Precautions Other (comment)   Precaution Comments arthritis in lower extremity joints     Restrictions   Weight Bearing Restrictions No     Balance Screen   Has the patient fallen in the past 6 months No   Has the patient had a decrease in activity level because of a fear of falling?  Yes   Is the patient reluctant to leave their home because of a fear of falling?  Yes     [place] Private residence   Living Arrangements Alone   Available Help at Discharge Family;Friend(s);Personal care attendant  occasionally, personal care assist one time a week, Alexa    Type of [place] entrance   [place]  One level   Research scientist (life sciences) - 2 wheels;Bedside commode;Wheelchair - power;Transport chair     Prior Function   Level of Independence Independent with basic ADLs;Independent with household mobility with device;Independent with homemaking with wheelchair   Leisure loves to read, loves to swim, likes to be around people.      Cognition   Overall Cognitive Status Within Functional Limits for tasks assessed     Observation/Other Assessments   Observations Pt is morbidly obese.  She comes in wheelchair  with socks on and hospital socks that are cut to go over toes. She has multiple lobules with lymphedema in both legs, thigs to feet.  Large lobule in right thigh.  left leg if columnar from knee to foot with large lobule at lateral ankle to bottom of foot and large lobule at dorsum of foot.  Dark area at top of foot and lateral left leg just above where lateral malleolus would be . Dry skin and  keratotic changes especially on left leg               Skin Integrity No visible open areas and pt does not report open areas but skin deep in between skin folds is not visible.    Other Surveys  --  Lymphedema Life Impact Scale 61 or 91% impaired      Coordination   Gross Motor Movements are Fluid and Coordinated --  movements very slow and labored due to hip, knee and leg pai     Posture/Postural Control   Posture/Postural Control Postural limitations   Postural Limitations Increased lumbar lordosis   Posture Comments unable to stand erect in standing      ROM / Strength   AROM / PROM / Strength Strength     Strength   Overall Strength Comments Pt not able to raise her legs to lift feet off the floor to move the wheelchair She reports they hurt too much and her legs are too heavy.  She is not strong enough to move the weight of her body     Transfers   Transfers Sit to Stand   Sit to Stand 6: Modified independent (Device/Increase time)  extra time and effort, small steps to turn body    Comments able to stand for 60 seconds while tranferrring to mat      Ambulation/Gait   Ambulation/Gait --  pt uses wheelchair most of the time., transfers only            LYMPHEDEMA/ONCOLOGY QUESTIONNAIRE - 05/16/16 1517      What other symptoms do you have   Are you Having Heaviness or Tightness Yes   Are you having Pain Yes   Are you having pitting edema --  feels very firm , but does not pit      Right Lower Extremity Lymphedema   10 cm Proximal to Suprapatella 99.5 cm  widest part of thigh thigh over lobules   At Midpatella/Popliteal Crease 77 cm  knee crease with knee bent    30 cm Proximal to Floor at Lateral Plantar Foot 76 cm   20 cm Proximal to Floor at Lateral Plantar Foot 63 1   10 cm Proximal to Floor at Lateral Malleoli 51 cm   5 cm Proximal to 1st MTP Joint 33 cm   Around Proximal Great Toe 10.5 cm   Other measured in sitting position with feet on floor      Left  Lower Extremity Lymphedema   10 cm  Proximal to Suprapatella 87 cm  largest part of thigh    At Midpatella/Popliteal Crease 63 cm   30 cm Proximal to Floor at Lateral Plantar Foot 72 cm   20 cm Proximal to Floor at Lateral Plantar Foot 67 cm   10 cm Proximal to Floor at Lateral Malleoli 63.5 cm   5 cm Proximal to 1st MTP Joint 36.5 cm  around widest part of foot    Around Proximal Great Toe 10 cm   Other measured in sitting position with feet on floor                 [facility] Adult PT Treatment/Exercise - 05/16/16 0001      Self-Care   Self-Care Other Self-Care Comments   Other Self-Care Comments  provided large Tg soft to both legs and extra medium tg soft over dorsum of left foot.  Also issued [employer] gym band for right thigh                    Short Term Clinic Goals - 05/16/16 1821      CC Short Term Goal  #1   Title Pt will verbalized knowledge of conservative treatment of lymphedema with elevation and exercise    Time 4   Period Weeks   Status New     CC Short Term Goal  #2   Title Pt will have a reduction of left leg at 10 cm proximal to the foor of 5 cm    Baseline 63.5   Time 4   Period Weeks   Status [facility] Goals - 05/16/16 1822      CC Long Term Goal  #1   Title Pt will report she can instruct her caregivers to use compression either by bandaging or farrow wraps to help control her lymphedema    Time 8   Period Weeks   Status New     CC Long Term Goal  #2   Title Pt will have a reduction of left leg circumference at 10 cm lateral to the floor by 7 cm    Baseline 63.5      CC Long Term Goal  #3   Title Pt will be independent in an exercise program to help stimulate lymph flow    Time 8   Period Weeks   Status New     CC Long Term Goal  #4   Title Pt will have a reduction in Lymphedema Life impact score to decrease GCode to 60%            Plan - 05/16/16 1806    Clinical Impression Statement Obese  female with bilateral leg lymphedema with keratotic changes especially on left leg.  that may be contributed to by dependent position as she has been keeping her legs down most of the time recently.  She has limited help at home, but plans to bring caregivers in to learn how to bandage her legs.  She has problems with transportation. She does have compression pumps but does not know how to use them well.  She has [employer] she has used in the past. She is thinking about joining [employer] and i told her that losing wieight would help her control her lymphedema. She has significant lobules on both legs, but especially around left foot that will require speciall bandaging.  She may benefit form circaid reduction  kits, especilly to left leg if she can afford them She will have difficutly donning compression garmnes.  I do feel she has potential to reduce the lymphedema in both her legs with bandaging , but it will require more follow up at home to maintain.     Clinical Impairments Affecting Rehab Potential morbid obesity, inability to walk, limited assist at home.    PT Frequency 2x / week  scheduling will be difficult due to transportation issues    PT Duration 4 weeks   PT Treatment/Interventions ADLs/Self Care Home Management;Patient/family education;Taping;Vasopneumatic Device;Orthotic Fit/Training;DME Instruction;Manual techniques;Manual lymph drainage;Compression bandaging;Therapeutic exercise;Therapeutic activities   PT Next Visit Plan instruct in elevation and exercise, manual lymph drainage and bandaging to left leg and to right leg if time    Consulted and Agree with Plan of Care Patient      Patient will benefit from skilled therapeutic intervention in order to improve the following deficits and impairments:  Decreased skin integrity, Increased edema  Visit Diagnosis: Lymphedema, not elsewhere classified  Muscle weakness (generalized)  Pain in left leg  Pain in right leg       G-Codes - 05-21-16 1825    Functional Assessment Tool Used lymphedema life impact scale    Functional Limitation Self care   Self Care Current Status (E0712) At least 80 percent but less than 100 percent impaired, limited or restricted   Self Care Goal Status (R9758) At least 60 percent but less than 80 percent impaired, limited or restricted       Problem List Patient Active Problem List   Diagnosis Date Noted  . Lymphedema of both lower extremities   . Abnormal CT of the abdomen   . Pressure injury of skin 04/28/2016  . Intractable lower abdominal pain 04/27/2016  . Constipation   . Respiratory distress   . Acute cystitis without hematuria   . Sepsis ([place]) 09/26/2014  . UTI (urinary tract infection) 09/26/2014  . Asthma 09/26/2014  . Asthma with acute exacerbation   . Blood poisoning ([place])   . Urinary tract infectious disease   . Diabetes type 2, uncontrolled ([place])   . Acute on chronic renal failure ([place])   . Essential hypertension, benign   . Acute renal failure ([place]) 01/25/2013  . Cellulitis of leg, bilateral 01/23/2013  . Ulcers of both lower extremities ([place]) 01/23/2013  . Lymphedema 06/13/2011  . DM type 2, uncontrolled, with neuropathy ([place]) 06/23/2010  . Morbid obesity ([place]) 06/23/2010  . ANEMIA-NOS 06/23/2010  . Essential hypertension 06/23/2010  . Asthma 06/23/2010    Norwood Levo May 21, 2016, 6:28 PM  [place], [place], [address] Phone: [phone]   Fax:  [phone]  Name: Jodi Underwood MRN: [id number] Date of Birth: May 23, 1946

## 2016-05-23 ENCOUNTER — Ambulatory Visit: Payer: Medicare Other | Admitting: Physical Therapy

## 2016-05-29 ENCOUNTER — Ambulatory Visit: Payer: Medicare Other | Admitting: Physical Therapy

## 2016-05-31 ENCOUNTER — Encounter: Payer: Medicare Other | Admitting: Physical Therapy

## 2016-06-02 ENCOUNTER — Encounter: Payer: Medicare Other | Admitting: Physical Therapy

## 2016-06-05 ENCOUNTER — Encounter: Payer: Medicare Other | Admitting: Physical Therapy

## 2016-06-07 ENCOUNTER — Encounter: Payer: Medicare Other | Admitting: Physical Therapy

## 2016-06-14 ENCOUNTER — Encounter: Payer: Medicare Other | Admitting: Physical Therapy

## 2016-06-16 ENCOUNTER — Encounter: Payer: Medicare Other | Admitting: Physical Therapy

## 2016-08-21 ENCOUNTER — Ambulatory Visit: Payer: Medicare Other | Admitting: Physical Therapy

## 2016-08-23 ENCOUNTER — Ambulatory Visit: Payer: Medicare Other | Attending: Internal Medicine

## 2016-08-23 DIAGNOSIS — M79604 Pain in right leg: Secondary | ICD-10-CM | POA: Diagnosis present

## 2016-08-23 DIAGNOSIS — I890 Lymphedema, not elsewhere classified: Secondary | ICD-10-CM | POA: Diagnosis not present

## 2016-08-23 DIAGNOSIS — M79605 Pain in left leg: Secondary | ICD-10-CM | POA: Diagnosis present

## 2016-08-23 DIAGNOSIS — M6281 Muscle weakness (generalized): Secondary | ICD-10-CM

## 2016-08-23 NOTE — Patient Instructions (Signed)
Circles near neck above collarbones. 5 times   Cancer Rehab [id number] Deep Effective Breath   Standing, sitting, or laying down place both hands on the belly. Take a deep breath IN, expanding the belly; then breath OUT, contracting the belly. Repeat __5__ times. Do __2-3__ sessions per day and before each self massage.  http://gt2.exer.us/866   Copyright  VHI. All rights reserved.  Inguinal Nodes to Axilla - Clear   On involved side, at armpit, make _5__ in-place circles. Then from hip proceed in sections to armpit with stationary circles or pumps _5_ times, this is your pathway. Do _1__ time per day.  Copyright  VHI. All rights reserved.  LEG: Knee to Hip - Clear   Pump up outer thigh of involved leg from knee to outer hip. Then do stationary circles from inner to outer thigh, then do outer thigh again. Next, interlace fingers behind knee IF ABLE and make in-place circles. Do _5_ times of each sequence.  Do _1__ time per day.

## 2016-08-23 NOTE — Therapy (Signed)
[place], [place], [address] Phone: [phone]   Fax:  [phone]  Physical Therapy Treatment  Patient Details  Name: Jodi Underwood MRN: [id number] Date of Birth: [birth year] Referring Provider: Charna Archer  Encounter Date: 08/23/2016      PT End of Session - 08/23/16 1626    Visit Number 2   Number of Visits 18   Date for PT Re-Evaluation 10/18/16  visits will be spread out due to transportation issues   PT Start Time 1301   PT Stop Time 1434   PT Time Calculation (min) 93 min   Activity Tolerance Patient limited by pain   Behavior During Therapy [facility] for tasks assessed/performed      Past Medical History:  Diagnosis Date  . Anemia   . Arthritis   . Asthma   . Degenerative disc disease   . Diabetes mellitus   . Diabetic neuropathy ([place])   . Hypertension   . Lymphedema   . Pneumonia   . Urinary incontinence     Past Surgical History:  Procedure Laterality Date  . ABDOMINAL HYSTERECTOMY     COMPLETE    There were no vitals filed for this visit.      Subjective Assessment - 08/23/16 1308    Subjective I've been wearing the stockinettes since I was here. I had a CNA come to my house and she's wrapped my legs with lymphedema bandages and these have been on for 2 weeks. I had a blister on my Rt ankle and we put some foam there to help cushion the blister against the bandages rubbing. Pt also reports unable to transfer today and needs to stay in her WC.   Patient is accompained by: --  Jana Half, a massage therapist that is certified in MLD    Pertinent History 2007 quit walking because of pain in knees and she had difficulty at work walking.  She got a wheelchair at the at time.  2013, was in motorized chair for 8-10 hours a day while she worked.  She developed wounds in her legs. She had care at [facility] for lymphedema and wrapping and get results. She had farrow wraps at one time and  had results, but was not able to maintain it.  She has recently got comopression pumps but doesn't know how to use them    Limitations Walking;Standing  for a very short time, takes a few steps   Patient Stated Goals to be able to manage the lymphedema at a level that allows me to have a "normal" life.  pt would love to be able to back to water exercise    Currently in Pain? No/denies               LYMPHEDEMA/ONCOLOGY QUESTIONNAIRE - 08/23/16 1327      Right Lower Extremity Lymphedema   10 cm Proximal to Suprapatella 99.4 cm  widest part of thigh around lobules   At Midpatella/Popliteal Crease 79.6 cm  Knee crease with knee bent   30 cm Proximal to Floor at Lateral Plantar Foot 77.1 cm   20 cm Proximal to Floor at Lateral Plantar Foot 67.6 1   10  cm Proximal to Floor at Lateral Malleoli 53.6 cm   5 cm Proximal to 1st MTP Joint 30.9 cm  Around largest part of foot   Around Proximal Great Toe 10.7 cm   Other All done seated in [facility]     Left Lower Extremity Lymphedema  10 cm Proximal to Suprapatella 90.9 cm  Largest part of thigh in sitting   At Midpatella/Popliteal Crease 68.5 cm  knee crease with knee bent   30 cm Proximal to Floor at Lateral Plantar Foot 59.7 cm   20 cm Proximal to Floor at Lateral Plantar Foot 61.5 cm   10 cm Proximal to Floor at Lateral Malleoli 57.6 cm   5 cm Proximal to 1st MTP Joint 34.4 cm  Around widest part of foot   Around Proximal Great Toe 9.4 cm   Other All done seated in [employer]                  [facility] Adult PT Treatment/Exercise - 08/23/16 0001      Self-Care   Self-Care Other Self-Care Comments   Other Self-Care Comments  Demonstrated for pt LE remedial exercises but she was unable to perform most of these as she started having Rt hip pains towards end of session: LAQ, hip abd/add, marching, and ankle pumps to be done with her bandages on.     Manual Therapy   Manual Lymphatic Drainage (MLD) Verbally instructed pt in this while she  returned correct hand placements: Short neck, 5 diaphragmatic breaths, Rt axilla, Rt inguino-axillary anastomosis, and Rt upper thigh lateral and medial aspects.   Compression Bandaging Biotone lotion applied to bil LE's and then to bil LE's: Thick stockinette, Artiflex x2, (no Elastomull as with lobules on dorsums of feet unable to wrap toes), 1-6, 1-8, 1-10, and 1-12 cm short stretch compression bandages from foot to knee instructing pts caregiver throughout.   Other Manual Therapy Removed bandages that pt had had on for 2 weeks that she reports a CNA donned for her. Washed LE's while inspecting skin which overall looked good. She had a healing blister at her Rt lateral malleolus with no drainage present. Circumferences measurements taken today.                PT Education - 08/23/16 1623    Education provided Yes   Education Details Instructed pts caregiver in bandaging sequence and instructed pt in remedial LE exercises to do with bandaging   Person(s) Educated Patient;Caregiver(s)  Jana Half who is a Geophysicist/field seismologist and is certified in manual lymph drainage   Methods Explanation;Demonstration;Handout   Comprehension Verbalized understanding;Need further instruction           Short Term Clinic Goals - 05/16/16 1821      CC Short Term Goal  #1   Title Pt will verbalized knowledge of conservative treatment of lymphedema with elevation and exercise    Time 4   Period Weeks   Status New     CC Short Term Goal  #2   Title Pt will have a reduction of left leg at 10 cm proximal to the foor of 5 cm    Baseline 63.5   Time 4   Period Weeks   Status New             Long Term Clinic Goals - 08/23/16 1639      CC Long Term Goal  #1   Title Pt will report she can instruct her caregivers to use compression either by bandaging or farrow wraps to help control her lymphedema    Baseline Began instruction of bandaging to caregiver who is already doing manual lymph drainage for  pt   Status On-going     CC Long Term Goal  #2   Title Pt will have a  reduction of left leg circumference at 10 cm lateral to the floor by 7 cm    Baseline 63.5; 57.6 cm-08/23/16   Status On-going     CC Long Term Goal  #3   Title Pt will be independent in an exercise program to help stimulate lymph flow    Baseline Instructed pt in this today-08/23/16   Status On-going     CC Long Term Goal  #4   Title Pt will have a reduction in Lymphedema Life impact score to decrease GCode to 60%   Baseline 91% impaired at eval   Status On-going            Plan - 08/23/16 1627    Clinical Impression Statement Pt hasn't been seen at our clinic since evaluation in October. Pt continues with dependent position throughout most of day reporting lately it has been harder for her to get up and walk with legs as swollen as they are. Her circumference measurments were reduced today at her Lt LE but increased on the Rt. Pt came in short stretch compression bandages with isoband type bandage over that, then thick stockinette over bandages "to hold them in place". Pt learned proper sequence today and not to place bandages directly on skin. Her skin overall looked good other than the keratotic changes from her stage of lymphedema but no open sores except healing blister at Rt lateral malleolus. Massage therapist, Jana Half, that accompanied pt seemed to do well with initial instruction of bandaging and plans to help pt with this between appts as she thinks she can only come 2x/week. When asked pt is unsure of where her pump came from but she is going to try to find out when she gets home so we can possibly follow up. (She reports upon first trial it made her recliner uneven and she fell out so stopped using it). She did not have a proper trial, they just dropped it off.   Clinical Impairments Affecting Rehab Potential morbid obesity, inability to walk, limited assist at home.    PT Frequency 2x / week  scheduling may be  difficult due to transportation issues   PT Duration 8 weeks   PT Treatment/Interventions ADLs/Self Care Home Management;Patient/family education;Taping;Vasopneumatic Device;Orthotic Fit/Training;DME Instruction;Manual techniques;Manual lymph drainage;Compression bandaging;Therapeutic exercise;Therapeutic activities   PT Next Visit Plan Renewal done this visit. Review remedial exercises prn, cont with instruction of bandaging with caregiver Jana Half plans to return, another caregiver may come at another time); and cont as time allows manual lymph drainage (encouraging pt to do what she can at home of it) and bandaging of bil LE's to her knees. See if pt found out who her pump is through and try to contact them for a trial for pt.    PT Home Exercise Plan Remedial exercises, Jana Half to rewrap leg in next 2-3 days per handout and pt to begin some self manual lymph drainage as able   Consulted and Agree with Plan of Care Patient;Family member/caregiver   Family Member Consulted Caregiver Jana Half, massage therapist       Patient will benefit from skilled therapeutic intervention in order to improve the following deficits and impairments:  Decreased skin integrity, Increased edema  Visit Diagnosis: Lymphedema, not elsewhere classified - Plan: PT plan of care cert/re-cert  Muscle weakness (generalized) - Plan: PT plan of care cert/re-cert  Pain in left leg - Plan: PT plan of care cert/re-cert  Pain in right leg - Plan: PT plan of care cert/re-cert  G-Codes - 08/23/16 1711    Functional Assessment Tool Used lymphedema life impact scale done at initial eval on 05/16/16   Functional Limitation Self care   Self Care Current Status [id number]) At least 80 percent but less than 100 percent impaired, limited or restricted   Self Care Goal Status (B9390) At least 60 percent but less than 80 percent impaired, limited or restricted      Problem List Patient Active Problem List   Diagnosis Date Noted   . Lymphedema of both lower extremities   . Abnormal CT of the abdomen   . Pressure injury of skin 04/28/2016  . Intractable lower abdominal pain 04/27/2016  . Constipation   . Respiratory distress   . Acute cystitis without hematuria   . Sepsis ([place]) 09/26/2014  . UTI (urinary tract infection) 09/26/2014  . Asthma 09/26/2014  . Asthma with acute exacerbation   . Blood poisoning ([place])   . Urinary tract infectious disease   . Diabetes type 2, uncontrolled ([place])   . Acute on chronic renal failure ([place])   . Essential hypertension, benign   . Acute renal failure ([place]) 01/25/2013  . Cellulitis of leg, bilateral 01/23/2013  . Ulcers of both lower extremities ([place]) 01/23/2013  . Lymphedema 06/13/2011  . DM type 2, uncontrolled, with neuropathy ([place]) 06/23/2010  . Morbid obesity ([place]) 06/23/2010  . ANEMIA-NOS 06/23/2010  . Essential hypertension 06/23/2010  . Asthma 06/23/2010    SALISBURY,DONNA, PTA 08/23/2016, 5:13 PM  [place], [place], [address] Phone: [phone]   Fax:  [phone]  Name: Jodi Underwood MRN: [id number] Date of Birth: 1945-08-17  Serafina Royals, PT 08/23/16 5:13 PM

## 2016-08-25 ENCOUNTER — Encounter: Payer: Medicare Other | Admitting: Physical Therapy

## 2016-08-28 ENCOUNTER — Encounter: Payer: Self-pay | Admitting: Physical Therapy

## 2016-08-28 ENCOUNTER — Ambulatory Visit: Payer: Medicare Other | Admitting: Physical Therapy

## 2016-08-28 DIAGNOSIS — M79605 Pain in left leg: Secondary | ICD-10-CM

## 2016-08-28 DIAGNOSIS — I890 Lymphedema, not elsewhere classified: Secondary | ICD-10-CM | POA: Diagnosis not present

## 2016-08-28 DIAGNOSIS — M79604 Pain in right leg: Secondary | ICD-10-CM

## 2016-08-28 NOTE — Therapy (Signed)
[place], [place], [address] Phone: [phone]   Fax:  [phone]  Physical Therapy Treatment  Patient Details  Name: Jodi Underwood MRN: [id number] Date of Birth: 04-16-1946 Referring Provider: Charna Archer  Encounter Date: 08/28/2016      PT End of Session - 08/28/16 1621    Visit Number 3   Number of Visits 18   Date for PT Re-Evaluation 10/18/16  vists will be spread out due to transportation issues   PT Start Time 1447  pt arrived late   PT Stop Time 1558   PT Time Calculation (min) 71 min   Activity Tolerance Patient tolerated treatment well   Behavior During Therapy [facility] for tasks assessed/performed      Past Medical History:  Diagnosis Date  . Anemia   . Arthritis   . Asthma   . Degenerative disc disease   . Diabetes mellitus   . Diabetic neuropathy ([place])   . Hypertension   . Lymphedema   . Pneumonia   . Urinary incontinence     Past Surgical History:  Procedure Laterality Date  . ABDOMINAL HYSTERECTOMY     COMPLETE    There were no vitals filed for this visit.      Subjective Assessment - 08/28/16 1450    Subjective I have had these bandages on since Wednesday of last week. My CNAs are not with me today to learn how to bandage.    Pertinent History 2007 quit walking because of pain in knees and she had difficulty at work walking.  She got a wheelchair at the at time.  2013, was in motorized chair for 8-10 hours a day while she worked.  She developed wounds in her legs. She had care at [facility] for lymphedema and wrapping and get results. She had farrow wraps at one time and had results, but was not able to maintain it.  She has recently got comopression pumps but doesn't know how to use them    Limitations Walking;Standing   Patient Stated Goals to be able to manage the lymphedema at a level that allows me to have a "normal" life.  pt would love to be able to back to  water exercise    Currently in Pain? Yes   Pain Score 6    Pain Location Leg   Pain Orientation Left;Right   Pain Descriptors / Indicators Tingling   Pain Type Chronic pain   Pain Onset More than a month ago   Pain Frequency Intermittent   Aggravating Factors  worse at night   Pain Relieving Factors tramadol   Effect of Pain on Daily Activities hard to sleep               LYMPHEDEMA/ONCOLOGY QUESTIONNAIRE - 08/28/16 1508      Right Lower Extremity Lymphedema   30 cm Proximal to Floor at Lateral Plantar Foot 77 cm   20 cm Proximal to Floor at Lateral Plantar Foot 70.5 1   10  cm Proximal to Floor at Lateral Malleoli 57.2 cm   5 cm Proximal to 1st MTP Joint 29.7 cm   Around Proximal Great Toe 10.5 cm   Other All done seated in WC     Left Lower Extremity Lymphedema   At Midpatella/Popliteal Crease 70 cm   30 cm Proximal to Floor at Lateral Plantar Foot 66 cm   20 cm Proximal to Floor at Lateral Plantar Foot 62 cm   10 cm Proximal to  Floor at Lateral Malleoli 59 cm   5 cm Proximal to 1st MTP Joint 35 cm   Around Proximal Great Toe 9.5 cm   Other All done seated in WC                  [facility] Adult PT Treatment/Exercise - 08/28/16 0001      Manual Therapy   Compression Bandaging Biotone lotion applied to bil LE's and then to bil LE's: Thick stockinette, Artiflex x2, (no Elastomull as with lobules on dorsums of feet unable to wrap toes), 1-6, 1-8, 1-10, and 1-12 cm short stretch compression bandages from foot to knee instructing pts caregiver throughout.   Other Manual Therapy Removed bandages that pt had intact since last session. Washed LE's while inspecting skin which overall looked good. Circumferences measurements taken today.                   Short Term Clinic Goals - 05/16/16 1821      CC Short Term Goal  #1   Title Pt will verbalized knowledge of conservative treatment of lymphedema with elevation and exercise    Time 4   Period Weeks    Status New     CC Short Term Goal  #2   Title Pt will have a reduction of left leg at 10 cm proximal to the foor of 5 cm    Baseline 63.5   Time 4   Period Weeks   Status New             Long Term Clinic Goals - 08/23/16 1639      CC Long Term Goal  #1   Title Pt will report she can instruct her caregivers to use compression either by bandaging or farrow wraps to help control her lymphedema    Baseline Began instruction of bandaging to caregiver who is already doing manual lymph drainage for pt   Status On-going     CC Long Term Goal  #2   Title Pt will have a reduction of left leg circumference at 10 cm lateral to the floor by 7 cm    Baseline 63.5; 57.6 cm-08/23/16   Status On-going     CC Long Term Goal  #3   Title Pt will be independent in an exercise program to help stimulate lymph flow    Baseline Instructed pt in this today-08/23/16   Status On-going     CC Long Term Goal  #4   Title Pt will have a reduction in Lymphedema Life impact score to decrease GCode to 60%   Baseline 91% impaired at eval   Status On-going            Plan - 08/28/16 1623    Clinical Impression Statement Patients bandages had slid down on her right lower extremity and were loose on her left lower extremity. Pt reports her helper re bandaged her LLE since it was bandaged last at this clinic. Pt's RLE was slightly more reduced at foot and ankle and LLE measurements had increased since last session. Her skin appeared intact this session and legs were washed and moisturized. Patient was unable to bring anyone to her appointment today to instruct in bandaging technique. Bandages were reapplied. Pt forgot to see who manufactures her pump and will let us know next session.    Clinical Impairments Affecting Rehab Potential morbid obesity, inability to walk, limited assist at home.    PT Frequency 2x / week  scheduling may  be difficult due to transportation issues   PT Duration 8 weeks   PT  Treatment/Interventions ADLs/Self Care Home Management;Patient/family education;Taping;Vasopneumatic Device;Orthotic Fit/Training;DME Instruction;Manual techniques;Manual lymph drainage;Compression bandaging;Therapeutic exercise;Therapeutic activities   PT Next Visit Plan Review remedial exercises prn, cont with instruction of bandaging with caregiver Jana Half plans to return, another caregiver may come at another time); and cont as time allows manual lymph drainage (encouraging pt to do what she can at home of it) and bandaging of bil LE's to her knees. See if pt found out who her pump is through and try to contact them for a trial for pt.    PT Home Exercise Plan Remedial exercises, Jana Half to rewrap leg in next 2-3 days per handout and pt to begin some self manual lymph drainage as able   Consulted and Agree with Plan of Care Patient      Patient will benefit from skilled therapeutic intervention in order to improve the following deficits and impairments:  Decreased skin integrity, Increased edema  Visit Diagnosis: Lymphedema, not elsewhere classified  Pain in left leg  Pain in right leg     Problem List Patient Active Problem List   Diagnosis Date Noted  . Lymphedema of both lower extremities   . Abnormal CT of the abdomen   . Pressure injury of skin 04/28/2016  . Intractable lower abdominal pain 04/27/2016  . Constipation   . Respiratory distress   . Acute cystitis without hematuria   . Sepsis ([place]) 09/26/2014  . UTI (urinary tract infection) 09/26/2014  . Asthma 09/26/2014  . Asthma with acute exacerbation   . Blood poisoning ([place])   . Urinary tract infectious disease   . Diabetes type 2, uncontrolled ([place])   . Acute on chronic renal failure ([place])   . Essential hypertension, benign   . Acute renal failure ([place]) 01/25/2013  . Cellulitis of leg, bilateral 01/23/2013  . Ulcers of both lower extremities ([place]) 01/23/2013  . Lymphedema 06/13/2011  . DM type 2, uncontrolled,  with neuropathy ([place]) 06/23/2010  . Morbid obesity ([place]) 06/23/2010  . ANEMIA-NOS 06/23/2010  . Essential hypertension 06/23/2010  . Asthma 06/23/2010    Allyson Sabal [facility] 08/28/2016, 4:27 PM  [place], [place], [address] Phone: [phone]   Fax:  [phone]  Name: Ines Rebel MRN: [id number] Date of Birth: 17-Dec-1945  Manus Gunning, PT 08/28/16 4:27 PM

## 2016-08-30 ENCOUNTER — Encounter: Payer: Medicare Other | Admitting: Physical Therapy

## 2016-08-31 ENCOUNTER — Ambulatory Visit: Payer: Medicare Other

## 2016-09-01 ENCOUNTER — Encounter: Payer: Medicare Other | Admitting: Physical Therapy

## 2016-09-04 ENCOUNTER — Ambulatory Visit: Payer: Medicare Other | Admitting: Physical Therapy

## 2016-09-04 ENCOUNTER — Encounter: Payer: Self-pay | Admitting: Physical Therapy

## 2016-09-04 DIAGNOSIS — M79604 Pain in right leg: Secondary | ICD-10-CM

## 2016-09-04 DIAGNOSIS — I890 Lymphedema, not elsewhere classified: Secondary | ICD-10-CM | POA: Diagnosis not present

## 2016-09-04 DIAGNOSIS — M79605 Pain in left leg: Secondary | ICD-10-CM

## 2016-09-04 NOTE — Therapy (Signed)
[place], [place], [address] Phone: [phone]   Fax:  [phone]  Physical Therapy Treatment  Patient Details  Name: Jodi Underwood MRN: [id number] Date of Birth: [birth year] Referring Provider: Charna Archer  Encounter Date: 09/04/2016      PT End of Session - 09/04/16 1557    Visit Number 4   Number of Visits 18   Date for PT Re-Evaluation 10/18/16   PT Start Time 1301   PT Stop Time 1407   PT Time Calculation (min) 66 min   Activity Tolerance Patient tolerated treatment well   Behavior During Therapy [facility] for tasks assessed/performed      Past Medical History:  Diagnosis Date  . Anemia   . Arthritis   . Asthma   . Degenerative disc disease   . Diabetes mellitus   . Diabetic neuropathy ([place])   . Hypertension   . Lymphedema   . Pneumonia   . Urinary incontinence     Past Surgical History:  Procedure Laterality Date  . ABDOMINAL HYSTERECTOMY     COMPLETE    There were no vitals filed for this visit.      Subjective Assessment - 09/04/16 1303    Subjective The right leg came off on Friday. The lady that was supposed to re wrap me was supposed to come Saturday but she didn't. I took the left leg off today and washed the bandages at 5am. I brought the information for my pump. It's from [employer] [id number]   Pertinent History 2007 quit walking because of pain in knees and she had difficulty at work walking.  She got a wheelchair at the at time.  2013, was in motorized chair for 8-10 hours a day while she worked.  She developed wounds in her legs. She had care at [facility] for lymphedema and wrapping and get results. She had farrow wraps at one time and had results, but was not able to maintain it.  She has recently got comopression pumps but doesn't know how to use them    Patient Stated Goals to be able to manage the lymphedema at a level that allows me to have a "normal"  life.  pt would love to be able to back to water exercise    Currently in Pain? Yes   Pain Score 4    Pain Location Leg   Pain Orientation Left;Right   Pain Descriptors / Indicators Tightness   Pain Type Chronic pain                         [facility] Adult PT Treatment/Exercise - 09/04/16 0001      Manual Therapy   Compression Bandaging to bil LE's: Thick stockinette, Artiflex x2, (no Elastomull as with lobules on dorsums of feet unable to wrap toes), 1-6, 1-8, 1-10, and 1-12 cm short stretch compression bandages from foot to knee -- extra 10 cm bandages added at ankle due to increased swelling bilaterally and extra 12 cm bandage added from ankle to knee due to increased swelling today   Other Manual Therapy washed legs thoroughly and assessed skin- skin was intact but area of fragile skin noted on RLE in crease of anterior ankle bend                PT Education - 09/04/16 1601    Education provided Yes   Education Details encouraged pt to practice standing more at home and to  try to increase standing tolerance at least 1 second every day, importance of exercise in chair   Person(s) Educated Patient   Methods Explanation   Comprehension Verbalized understanding           Short Term Clinic Goals - 05/16/16 1821      CC Short Term Goal  #1   Title Pt will verbalized knowledge of conservative treatment of lymphedema with elevation and exercise    Time 4   Period Weeks   Status New     CC Short Term Goal  #2   Title Pt will have a reduction of left leg at 10 cm proximal to the foor of 5 cm    Baseline 63.5   Time 4   Period Weeks   Status New             Long Term Clinic Goals - 08/23/16 1639      CC Long Term Goal  #1   Title Pt will report she can instruct her caregivers to use compression either by bandaging or farrow wraps to help control her lymphedema    Baseline Began instruction of bandaging to caregiver who is already doing manual lymph  drainage for pt   Status On-going     CC Long Term Goal  #2   Title Pt will have a reduction of left leg circumference at 10 cm lateral to the floor by 7 cm    Baseline 63.5; 57.6 cm-08/23/16   Status On-going     CC Long Term Goal  #3   Title Pt will be independent in an exercise program to help stimulate lymph flow    Baseline Instructed pt in this today-08/23/16   Status On-going     CC Long Term Goal  #4   Title Pt will have a reduction in Lymphedema Life impact score to decrease GCode to 60%   Baseline 91% impaired at eval   Status On-going            Plan - 09/04/16 1557    Clinical Impression Statement Patient was unable to leave RLE bandage intact since last session and had to remove it on Friday because it came loose. Her caregiver did not come to re wrap her. She removed bandages from LLE early this morning to wash her bandages because she had an accident. Pt's bilateral LEs are much more swollen this visit. Used two additional bandages on each LE due to increased swelling and need for more coverage.    Clinical Impairments Affecting Rehab Potential morbid obesity, inability to walk, limited assist at home.    PT Frequency 2x / week  transportation issues   PT Duration 8 weeks   PT Treatment/Interventions ADLs/Self Care Home Management;Patient/family education;Taping;Vasopneumatic Device;Orthotic Fit/Training;DME Instruction;Manual techniques;Manual lymph drainage;Compression bandaging;Therapeutic exercise;Therapeutic activities   PT Next Visit Plan Review remedial exercises prn, cont with instruction of bandaging with caregiver Jana Half plans to return, another caregiver may come at another time); and cont as time allows manual lymph drainage (encouraging pt to do what she can at home of it) and bandaging of bil LE's to her knees. See if pt found out who her pump is through and try to contact them for a trial for pt.    PT Home Exercise Plan Remedial exercises, Jana Half to rewrap  leg in next 2-3 days per handout, and pt to begin some self manual lymph drainage as able   Consulted and Agree with Plan of Care Patient  Patient will benefit from skilled therapeutic intervention in order to improve the following deficits and impairments:  Decreased skin integrity, Increased edema  Visit Diagnosis: Lymphedema, not elsewhere classified  Pain in left leg  Pain in right leg     Problem List Patient Active Problem List   Diagnosis Date Noted  . Lymphedema of both lower extremities   . Abnormal CT of the abdomen   . Pressure injury of skin 04/28/2016  . Intractable lower abdominal pain 04/27/2016  . Constipation   . Respiratory distress   . Acute cystitis without hematuria   . Sepsis ([place]) 09/26/2014  . UTI (urinary tract infection) 09/26/2014  . Asthma 09/26/2014  . Asthma with acute exacerbation   . Blood poisoning ([place])   . Urinary tract infectious disease   . Diabetes type 2, uncontrolled ([place])   . Acute on chronic renal failure ([place])   . Essential hypertension, benign   . Acute renal failure ([place]) 01/25/2013  . Cellulitis of leg, bilateral 01/23/2013  . Ulcers of both lower extremities ([place]) 01/23/2013  . Lymphedema 06/13/2011  . DM type 2, uncontrolled, with neuropathy ([place]) 06/23/2010  . Morbid obesity ([place]) 06/23/2010  . ANEMIA-NOS 06/23/2010  . Essential hypertension 06/23/2010  . Asthma 06/23/2010    Allyson Sabal [facility] 09/04/2016, 4:02 PM  [place], [place], [address] Phone: [phone]   Fax:  [phone]  Name: Jodi Underwood MRN: [id number] Date of Birth: Jul 11, 1946  Manus Gunning, PT 09/04/16 4:03 PM

## 2016-09-07 ENCOUNTER — Ambulatory Visit: Payer: Medicare Other | Admitting: Physical Therapy

## 2016-09-08 ENCOUNTER — Encounter: Payer: Medicare Other | Admitting: Physical Therapy

## 2016-09-11 ENCOUNTER — Encounter: Payer: Self-pay | Admitting: Physical Therapy

## 2016-09-11 ENCOUNTER — Ambulatory Visit: Payer: Medicare Other | Admitting: Physical Therapy

## 2016-09-11 DIAGNOSIS — I890 Lymphedema, not elsewhere classified: Secondary | ICD-10-CM

## 2016-09-11 DIAGNOSIS — M79604 Pain in right leg: Secondary | ICD-10-CM

## 2016-09-11 DIAGNOSIS — M79605 Pain in left leg: Secondary | ICD-10-CM

## 2016-09-11 NOTE — Therapy (Signed)
[place], [place], [address] Phone: [phone]   Fax:  [phone]  Physical Therapy Treatment  Patient Details  Name: Jodi Underwood MRN: [id number] Date of Birth: 01/26/1946 Referring Provider: Charna Archer  Encounter Date: 09/11/2016      PT End of Session - 09/11/16 1424    Visit Number 5   Number of Visits 18   Date for PT Re-Evaluation 10/18/16   PT Start Time 1301   PT Stop Time 1418   PT Time Calculation (min) 77 min   Activity Tolerance Patient tolerated treatment well   Behavior During Therapy [facility] for tasks assessed/performed      Past Medical History:  Diagnosis Date  . Anemia   . Arthritis   . Asthma   . Degenerative disc disease   . Diabetes mellitus   . Diabetic neuropathy ([place])   . Hypertension   . Lymphedema   . Pneumonia   . Urinary incontinence     Past Surgical History:  Procedure Laterality Date  . ABDOMINAL HYSTERECTOMY     COMPLETE    There were no vitals filed for this visit.      Subjective Assessment - 09/11/16 1302    Subjective I seem to be moving so slow. I keep having all these stomach problems and I think it is from nerves. I am not having a lot of help from my family moving into my new apartment.    Pertinent History 2007 quit walking because of pain in knees and she had difficulty at work walking.  She got a wheelchair at the at time.  2013, was in motorized chair for 8-10 hours a day while she worked.  She developed wounds in her legs. She had care at [facility] for lymphedema and wrapping and get results. She had farrow wraps at one time and had results, but was not able to maintain it.  She has recently got comopression pumps but doesn't know how to use them    Patient Stated Goals to be able to manage the lymphedema at a level that allows me to have a "normal" life.  pt would love to be able to back to water exercise    Currently in Pain? Yes   Pain Score 5    Pain Location Hip   Pain Orientation Right;Left   Pain Descriptors / Indicators Aching   Pain Type Chronic pain   Pain Onset More than a month ago               LYMPHEDEMA/ONCOLOGY QUESTIONNAIRE - 09/11/16 1305      Right Lower Extremity Lymphedema   30 cm Proximal to Floor at Lateral Plantar Foot 81 cm   20 cm Proximal to Floor at Lateral Plantar Foot 70.5 1   10  cm Proximal to Floor at Lateral Malleoli 58 cm   5 cm Proximal to 1st MTP Joint 31.5 cm   Around Proximal Great Toe 11.5 cm     Left Lower Extremity Lymphedema   30 cm Proximal to Floor at Lateral Plantar Foot 66 cm   20 cm Proximal to Floor at Lateral Plantar Foot 67.5 cm   10 cm Proximal to Floor at Lateral Malleoli 63.5 cm   5 cm Proximal to 1st MTP Joint 35 cm   Around Proximal Great Toe 9.3 cm                  [facility] Adult PT Treatment/Exercise - 09/11/16 0001  Manual Therapy   Manual therapy comments circumferential measurements taken   Compression Bandaging to bil LE's: Thick stockinette, Artiflex x2, (no Elastomull as with lobules on dorsums of feet unable to wrap toes), 1-6, 1-8, 1-10, and 1-12 cm short stretch compression bandages from foot to knee -- extra 10 cm bandages added at ankle due to increased swelling bilaterally and extra 12 cm bandage added from ankle to knee due to increased swelling today   Other Manual Therapy washed legs thoroughly and assessed skin- skin was intact                    Short Term Clinic Goals - 05/16/16 1821      CC Short Term Goal  #1   Title Pt will verbalized knowledge of conservative treatment of lymphedema with elevation and exercise    Time 4   Period Weeks   Status New     CC Short Term Goal  #2   Title Pt will have a reduction of left leg at 10 cm proximal to the foor of 5 cm    Baseline 63.5   Time 4   Period Weeks   Status New             Long Term Clinic Goals - 09/11/16 1422      CC Long Term Goal   #1   Title Pt will report she can instruct her caregivers to use compression either by bandaging or farrow wraps to help control her lymphedema    Baseline Began instruction of bandaging to caregiver who is already doing manual lymph drainage for pt, 09/11/16- caregiver has not returned to assess indep   Time 8   Period Weeks   Status On-going     CC Long Term Goal  #2   Title Pt will have a reduction of left leg circumference at 10 cm lateral to the floor by 7 cm    Baseline 63.5; 57.6 cm-08/23/16, 09/12/16-63.5 - pt has not had bandages intact for several days due to incontinence issues   Time 8   Period Weeks   Status On-going     CC Long Term Goal  #3   Title Pt will be independent in an exercise program to help stimulate lymph flow    Baseline Instructed pt in this today-08/23/16   Time 8   Period Weeks   Status On-going     CC Long Term Goal  #4   Title Pt will have a reduction in Lymphedema Life impact score to decrease GCode to 60%   Baseline 91% impaired at eval   Time 8   Period Weeks   Status On-going            Plan - 09/11/16 1424    Clinical Impression Statement Patient has issues with incontinence and had to remove her bandages on Thursday of last week. Since then her circumferential measurements have increased further. Discussed with patient difficulty with maintaining consistent bandaging schedule with incontinence issues, lack of consistent caregivers and inability to attend appointments 3x/wk.. Focus now is being placed on getting patient compression garments or a long term management solution. She is supposed to bring her [place] wraps in at next session.   Clinical Impairments Affecting Rehab Potential morbid obesity, inability to walk, limited assist at home.    PT Frequency 2x / week   PT Duration 8 weeks   PT Treatment/Interventions ADLs/Self Care Home Management;Patient/family education;Taping;Vasopneumatic Device;Orthotic Fit/Training;DME Instruction;Manual  techniques;Manual lymph drainage;Compression  bandaging;Therapeutic exercise;Therapeutic activities   PT Next Visit Plan Review remedial exercises prn, cont with instruction of bandaging with caregiver Jana Half plans to return, another caregiver may come at another time); and cont as time allows manual lymph drainage (encouraging pt to do what she can at home of it) and bandaging of bil LE's to her knees. See if pt found out who her pump is through and try to contact them for a trial for pt.    PT Home Exercise Plan Remedial exercises, Jana Half to rewrap leg in next 2-3 days per handout, and pt to begin some self manual lymph drainage as able   Consulted and Agree with Plan of Care Patient      Patient will benefit from skilled therapeutic intervention in order to improve the following deficits and impairments:  Decreased skin integrity, Increased edema  Visit Diagnosis: Lymphedema, not elsewhere classified  Pain in left leg  Pain in right leg     Problem List Patient Active Problem List   Diagnosis Date Noted  . Lymphedema of both lower extremities   . Abnormal CT of the abdomen   . Pressure injury of skin 04/28/2016  . Intractable lower abdominal pain 04/27/2016  . Constipation   . Respiratory distress   . Acute cystitis without hematuria   . Sepsis ([place]) 09/26/2014  . UTI (urinary tract infection) 09/26/2014  . Asthma 09/26/2014  . Asthma with acute exacerbation   . Blood poisoning ([place])   . Urinary tract infectious disease   . Diabetes type 2, uncontrolled ([place])   . Acute on chronic renal failure ([place])   . Essential hypertension, benign   . Acute renal failure ([place]) 01/25/2013  . Cellulitis of leg, bilateral 01/23/2013  . Ulcers of both lower extremities ([place]) 01/23/2013  . Lymphedema 06/13/2011  . DM type 2, uncontrolled, with neuropathy ([place]) 06/23/2010  . Morbid obesity ([place]) 06/23/2010  . ANEMIA-NOS 06/23/2010  . Essential hypertension 06/23/2010  . Asthma 06/23/2010     Allyson Sabal [facility] 09/11/2016, 5:01 PM  [place], [place], [address] Phone: [phone]   Fax:  [phone]  Name: Jodi Underwood MRN: [id number] Date of Birth: 09/05/1945  Manus Gunning, PT 09/11/16 5:02 PM

## 2016-09-14 ENCOUNTER — Ambulatory Visit: Payer: Medicare Other | Admitting: Physical Therapy

## 2016-09-15 ENCOUNTER — Encounter: Payer: Medicare Other | Admitting: Physical Therapy

## 2016-09-18 ENCOUNTER — Ambulatory Visit: Payer: Medicare Other

## 2016-09-21 ENCOUNTER — Ambulatory Visit: Payer: Medicare Other | Attending: Internal Medicine | Admitting: Physical Therapy

## 2016-09-21 DIAGNOSIS — M79604 Pain in right leg: Secondary | ICD-10-CM

## 2016-09-21 DIAGNOSIS — I890 Lymphedema, not elsewhere classified: Secondary | ICD-10-CM

## 2016-09-21 DIAGNOSIS — M79605 Pain in left leg: Secondary | ICD-10-CM | POA: Diagnosis present

## 2016-09-21 DIAGNOSIS — M6281 Muscle weakness (generalized): Secondary | ICD-10-CM

## 2016-09-21 NOTE — Therapy (Addendum)
[place], [place], [address] Phone: [phone]   Fax:  [phone]  Physical Therapy Treatment  Patient Details  Name: Jodi Underwood MRN: [id number] Date of Birth: 04/27/1946 Referring Provider: Charna Archer  Encounter Date: 09/21/2016      PT End of Session - 09/21/16 1745    Visit Number 6   Number of Visits 18   PT Start Time 7169   PT Stop Time 1405   PT Time Calculation (min) 62 min   Activity Tolerance Patient tolerated treatment well   Behavior During Therapy [facility] for tasks assessed/performed      Past Medical History:  Diagnosis Date  . Anemia   . Arthritis   . Asthma   . Degenerative disc disease   . Diabetes mellitus   . Diabetic neuropathy ([place])   . Hypertension   . Lymphedema   . Pneumonia   . Urinary incontinence     Past Surgical History:  Procedure Laterality Date  . ABDOMINAL HYSTERECTOMY     COMPLETE    There were no vitals filed for this visit.      Subjective Assessment - 09/21/16 1739    Subjective Pt is here to get circaid reduction kits for her lower legs and is hopeful they will help her.  She still does not have much help at home    Pertinent History 2007 quit walking because of pain in knees and she had difficulty at work walking.  She got a wheelchair at the at time.  2013, was in motorized chair for 8-10 hours a day while she worked.  She developed wounds in her legs. She had care at [facility] for lymphedema and wrapping and get results. She had farrow wraps at one time and had results, but was not able to maintain it.  She has recently got comopression pumps but doesn't know how to use them    Patient Stated Goals to be able to manage the lymphedema at a level that allows me to have a "normal" life.  pt would love to be able to back to water exercise    Currently in Pain? Yes   Pain Score --  did not rate, has trouble moving around                           [facility] Adult PT Treatment/Exercise - 09/21/16 0001      Transfers   Comments pt was ablet to stand and pivot to mat and return to scooter after orthotic fitting      Self-Care   Other Self-Care Comments  Pt instructed to keep tightening velcro straps as needed to maintain compression on lower legs.      Manual Therapy   Other Manual Therapy fitting of circaid reduciton kit to both lower legs and feet with extra support straps as needed                 PT Education - 09/21/16 1744    Education provided Yes   Education Details tighten velcro straps on garments as needed to maintain compression on legs.    Person(s) Educated Patient   Methods Explanation;Demonstration   Comprehension Verbalized understanding           Short Term Clinic Goals - 05/16/16 1821      CC Short Term Goal  #1   Title Pt will verbalized knowledge of conservative treatment of lymphedema with elevation and  exercise    Time 4   Period Weeks   Status New     CC Short Term Goal  #2   Title Pt will have a reduction of left leg at 10 cm proximal to the foor of 5 cm    Baseline 63.5   Time 4   Period Weeks   Status New             Long Term Clinic Goals - 09/11/16 1422      CC Long Term Goal  #1   Title Pt will report she can instruct her caregivers to use compression either by bandaging or farrow wraps to help control her lymphedema    Baseline Began instruction of bandaging to caregiver who is already doing manual lymph drainage for pt, 09/11/16- caregiver has not returned to assess indep   Time 8   Period Weeks   Status On-going     CC Long Term Goal  #2   Title Pt will have a reduction of left leg circumference at 10 cm lateral to the floor by 7 cm    Baseline 63.5; 57.6 cm-08/23/16, 09/12/16-63.5 - pt has not had bandages intact for several days due to incontinence issues   Time 8   Period Weeks   Status On-going     CC Long Term Goal  #3    Title Pt will be independent in an exercise program to help stimulate lymph flow    Baseline Instructed pt in this today-08/23/16   Time 8   Period Weeks   Status On-going     CC Long Term Goal  #4   Title Pt will have a reduction in Lymphedema Life impact score to decrease GCode to 60%   Baseline 91% impaired at eval   Time 8   Period Weeks   Status On-going        Left leg frontal view     Left leg lateral view   Right leg frontal view    Right leg lateral view       Plan - 09/21/16 1746    Clinical Impression Statement Pt encouraged about use of the reduction kits for her lower legs.  She felt they afforded good compression and that she would be able to maintain them.    Clinical Impairments Affecting Rehab Potential morbid obesity, inability to walk, limited assist at home.    PT Frequency 2x / week   PT Duration 8 weeks   PT Treatment/Interventions ADLs/Self Care Home Management;Patient/family education;Taping;Vasopneumatic Device;Orthotic Fit/Training;DME Instruction;Manual techniques;Manual lymph drainage;Compression bandaging;Therapeutic exercise;Therapeutic activities   PT Next Visit Plan asses compliance with reduction kits.  Remeasure and adjust garments as necessary.  If good reduction, consider more reduction kits for upper legs in the future.    Consulted and Agree with Plan of Care Patient      Patient will benefit from skilled therapeutic intervention in order to improve the following deficits and impairments:  Decreased skin integrity, Increased edema  Visit Diagnosis: Lymphedema, not elsewhere classified  Pain in left leg  Pain in right leg  Muscle weakness (generalized)     Problem List Patient Active Problem List   Diagnosis Date Noted  . Lymphedema of both lower extremities   . Abnormal CT of the abdomen   . Pressure injury of skin 04/28/2016  . Intractable lower abdominal pain 04/27/2016  . Constipation   . Respiratory distress   .  Acute cystitis without hematuria   . Sepsis ([place]) 09/26/2014  .  UTI (urinary tract infection) 09/26/2014  . Asthma 09/26/2014  . Asthma with acute exacerbation   . Blood poisoning ([place])   . Urinary tract infectious disease   . Diabetes type 2, uncontrolled ([place])   . Acute on chronic renal failure ([place])   . Essential hypertension, benign   . Acute renal failure ([place]) 01/25/2013  . Cellulitis of leg, bilateral 01/23/2013  . Ulcers of both lower extremities ([place]) 01/23/2013  . Lymphedema 06/13/2011  . DM type 2, uncontrolled, with neuropathy ([place]) 06/23/2010  . Morbid obesity ([place]) 06/23/2010  . ANEMIA-NOS 06/23/2010  . Essential hypertension 06/23/2010  . Asthma 06/23/2010   Donato Heinz. Owens Shark, PT  [facility] 09/21/2016, 5:50 PM  [place], [place], [address] Phone: [phone]   Fax:  [phone]  Name: Kenora Spayd MRN: [id number] Date of Birth: 1946-06-26  PHYSICAL THERAPY DISCHARGE SUMMARY  Visits from Start of Care: 6  Current functional level related to goals / functional outcomes: unknown   Remaining deficits: unknown   Education / Equipment: Lymphedema management , home exercise program  Plan: Patient agrees to discharge.  Patient goals were partially met. Patient is being discharged due to not returning since the last visit.  ?????    Maudry Diego, PT 12/12/17 10:14 AM

## 2016-09-25 ENCOUNTER — Ambulatory Visit: Payer: Medicare Other | Admitting: Physical Therapy

## 2016-09-28 ENCOUNTER — Ambulatory Visit: Payer: Medicare Other

## 2016-10-06 ENCOUNTER — Telehealth: Payer: Self-pay | Admitting: Physical Therapy

## 2016-10-06 NOTE — Telephone Encounter (Signed)
Pt called this morning to say that she will not be coming back for therapy at this time.  She thinks she may have an infection on her leg and is getting home health nursing and doctor visits to get that attended to.  She has not been using the reduction kit on her leg and her daughter is not sure how to use the kit.   Advised pt she will need a new prescription when she wants to return to make sure the infection is clear and to bring a caregiver with her so that we could teach how to use the Circaid reduction kits. Pt acknowledged she understood.  Maudry Diego, PT 10/06/2016@ 12:40 PM

## 2016-10-12 ENCOUNTER — Encounter: Payer: Medicare Other | Admitting: Physical Therapy

## 2016-10-13 ENCOUNTER — Inpatient Hospital Stay (HOSPITAL_COMMUNITY)
Admission: EM | Admit: 2016-10-13 | Discharge: 2016-10-20 | DRG: 603 | Disposition: A | Discharge status: Home Health | Payer: Medicare Other | Attending: Family Medicine | Admitting: Family Medicine

## 2016-10-13 ENCOUNTER — Encounter (HOSPITAL_COMMUNITY): Payer: Self-pay | Admitting: Emergency Medicine

## 2016-10-13 ENCOUNTER — Other Ambulatory Visit: Payer: Self-pay

## 2016-10-13 DIAGNOSIS — E1165 Type 2 diabetes mellitus with hyperglycemia: Secondary | ICD-10-CM | POA: Diagnosis not present

## 2016-10-13 DIAGNOSIS — L03116 Cellulitis of left lower limb: Principal | ICD-10-CM | POA: Diagnosis present

## 2016-10-13 DIAGNOSIS — D649 Anemia, unspecified: Secondary | ICD-10-CM | POA: Diagnosis present

## 2016-10-13 DIAGNOSIS — I10 Essential (primary) hypertension: Secondary | ICD-10-CM | POA: Diagnosis present

## 2016-10-13 DIAGNOSIS — J4520 Mild intermittent asthma, uncomplicated: Secondary | ICD-10-CM | POA: Diagnosis not present

## 2016-10-13 DIAGNOSIS — E11621 Type 2 diabetes mellitus with foot ulcer: Secondary | ICD-10-CM | POA: Diagnosis present

## 2016-10-13 DIAGNOSIS — E860 Dehydration: Secondary | ICD-10-CM | POA: Diagnosis present

## 2016-10-13 DIAGNOSIS — Z794 Long term (current) use of insulin: Secondary | ICD-10-CM | POA: Diagnosis not present

## 2016-10-13 DIAGNOSIS — IMO0002 Reserved for concepts with insufficient information to code with codable children: Secondary | ICD-10-CM | POA: Diagnosis present

## 2016-10-13 DIAGNOSIS — L03115 Cellulitis of right lower limb: Secondary | ICD-10-CM | POA: Diagnosis present

## 2016-10-13 DIAGNOSIS — Z6841 Body Mass Index (BMI) 40.0 and over, adult: Secondary | ICD-10-CM | POA: Diagnosis not present

## 2016-10-13 DIAGNOSIS — E1140 Type 2 diabetes mellitus with diabetic neuropathy, unspecified: Secondary | ICD-10-CM | POA: Diagnosis present

## 2016-10-13 DIAGNOSIS — B351 Tinea unguium: Secondary | ICD-10-CM | POA: Diagnosis present

## 2016-10-13 DIAGNOSIS — J45909 Unspecified asthma, uncomplicated: Secondary | ICD-10-CM | POA: Diagnosis present

## 2016-10-13 DIAGNOSIS — Z90710 Acquired absence of both cervix and uterus: Secondary | ICD-10-CM

## 2016-10-13 DIAGNOSIS — R600 Localized edema: Secondary | ICD-10-CM

## 2016-10-13 DIAGNOSIS — R0602 Shortness of breath: Secondary | ICD-10-CM

## 2016-10-13 DIAGNOSIS — I890 Lymphedema, not elsewhere classified: Secondary | ICD-10-CM | POA: Diagnosis present

## 2016-10-13 DIAGNOSIS — L03119 Cellulitis of unspecified part of limb: Secondary | ICD-10-CM | POA: Diagnosis not present

## 2016-10-13 DIAGNOSIS — L97519 Non-pressure chronic ulcer of other part of right foot with unspecified severity: Secondary | ICD-10-CM | POA: Diagnosis present

## 2016-10-13 DIAGNOSIS — N179 Acute kidney failure, unspecified: Secondary | ICD-10-CM | POA: Diagnosis present

## 2016-10-13 LAB — CBC WITH DIFFERENTIAL/PLATELET
Basophils Absolute: 0.1 10*3/uL (ref 0.0–0.1)
Basophils Relative: 1 %
Eosinophils Absolute: 0.2 10*3/uL (ref 0.0–0.7)
Eosinophils Relative: 2 %
HCT: 38.5 % (ref 36.0–46.0)
Hemoglobin: 11.9 g/dL — ABNORMAL LOW (ref 12.0–15.0)
Lymphocytes Relative: 24 %
Lymphs Abs: 2 10*3/uL (ref 0.7–4.0)
MCH: 26.3 pg (ref 26.0–34.0)
MCHC: 30.9 g/dL (ref 30.0–36.0)
MCV: 85 fL (ref 78.0–100.0)
Monocytes Absolute: 0.8 10*3/uL (ref 0.1–1.0)
Monocytes Relative: 10 %
Neutro Abs: 5.4 10*3/uL (ref 1.7–7.7)
Neutrophils Relative %: 63 %
Platelets: 307 10*3/uL (ref 150–400)
RBC: 4.53 MIL/uL (ref 3.87–5.11)
RDW: 14.2 % (ref 11.5–15.5)
WBC: 8.5 10*3/uL (ref 4.0–10.5)

## 2016-10-13 LAB — PROCALCITONIN: Procalcitonin: 0.18 ng/mL

## 2016-10-13 LAB — BASIC METABOLIC PANEL
Anion gap: 8 (ref 5–15)
BUN: 24 mg/dL — ABNORMAL HIGH (ref 6–20)
CO2: 28 mmol/L (ref 22–32)
Calcium: 9.4 mg/dL (ref 8.9–10.3)
Chloride: 103 mmol/L (ref 101–111)
Creatinine, Ser: 1.46 mg/dL — ABNORMAL HIGH (ref 0.44–1.00)
GFR calc Af Amer: 41 mL/min — ABNORMAL LOW (ref >60)
GFR calc non Af Amer: 35 mL/min — ABNORMAL LOW (ref >60)
Glucose, Bld: 140 mg/dL — ABNORMAL HIGH (ref 65–99)
Potassium: 4.5 mmol/L (ref 3.5–5.1)
Sodium: 139 mmol/L (ref 135–145)

## 2016-10-13 LAB — SEDIMENTATION RATE: SED RATE: 45 mm/h — AB (ref 0–22)

## 2016-10-13 LAB — LACTIC ACID, PLASMA: Lactic Acid, Venous: 1.2 mmol/L (ref 0.5–1.9)

## 2016-10-13 MED ORDER — INSULIN ASPART 100 UNIT/ML ~~LOC~~ SOLN: 0.0000 [IU] | Freq: Every day | SUBCUTANEOUS | Status: DC

## 2016-10-13 MED ORDER — AMLODIPINE BESYLATE 10 MG PO TABS
10.0000 mg | ORAL_TABLET | Freq: Every day | ORAL | Status: DC
  Administered 2016-10-14: 10 mg via ORAL
  Filled 2016-10-13 (×2): qty 1

## 2016-10-13 MED ORDER — DEXTROSE 5 % IV SOLN
1.0000 g | Freq: Once | INTRAVENOUS | Status: AC
  Administered 2016-10-13: 1 g via INTRAVENOUS
  Filled 2016-10-13: qty 10

## 2016-10-13 MED ORDER — ZOLPIDEM TARTRATE 5 MG PO TABS
5.0000 mg | ORAL_TABLET | Freq: Every evening | ORAL | Status: DC | PRN
  Administered 2016-10-18: 5 mg via ORAL
  Filled 2016-10-13: qty 1

## 2016-10-13 MED ORDER — SODIUM CHLORIDE 0.9% FLUSH
3.0000 mL | Freq: Two times a day (BID) | INTRAVENOUS | Status: DC
  Administered 2016-10-13 – 2016-10-20 (×14): 3 mL via INTRAVENOUS

## 2016-10-13 MED ORDER — INSULIN ASPART 100 UNIT/ML ~~LOC~~ SOLN
0.0000 [IU] | Freq: Three times a day (TID) | SUBCUTANEOUS | Status: DC
  Administered 2016-10-14 (×2): 1 [IU] via SUBCUTANEOUS
  Administered 2016-10-14 – 2016-10-15 (×2): 2 [IU] via SUBCUTANEOUS
  Administered 2016-10-15: 1 [IU] via SUBCUTANEOUS
  Administered 2016-10-15 – 2016-10-16 (×4): 2 [IU] via SUBCUTANEOUS
  Administered 2016-10-17: 3 [IU] via SUBCUTANEOUS
  Administered 2016-10-17 (×2): 2 [IU] via SUBCUTANEOUS
  Administered 2016-10-18: 3 [IU] via SUBCUTANEOUS
  Administered 2016-10-18 (×2): 1 [IU] via SUBCUTANEOUS
  Administered 2016-10-19: 2 [IU] via SUBCUTANEOUS
  Administered 2016-10-19 (×2): 1 [IU] via SUBCUTANEOUS
  Administered 2016-10-20: 5 [IU] via SUBCUTANEOUS
  Administered 2016-10-20: 2 [IU] via SUBCUTANEOUS

## 2016-10-13 MED ORDER — DEXTROSE 5 % IV SOLN
1.0000 g | INTRAVENOUS | Status: DC
  Administered 2016-10-14 – 2016-10-15 (×2): 1 g via INTRAVENOUS
  Filled 2016-10-13 (×2): qty 10

## 2016-10-13 MED ORDER — ONDANSETRON HCL 4 MG/2ML IJ SOLN
4.0000 mg | Freq: Four times a day (QID) | INTRAMUSCULAR | Status: DC | PRN
  Administered 2016-10-17: 4 mg via INTRAVENOUS
  Filled 2016-10-13: qty 2

## 2016-10-13 MED ORDER — DIPHENHYDRAMINE HCL 25 MG PO CAPS
50.0000 mg | ORAL_CAPSULE | Freq: Every evening | ORAL | Status: DC | PRN
Start: 2016-10-13 — End: 2016-10-13

## 2016-10-13 MED ORDER — ACETAMINOPHEN 325 MG PO TABS
650.0000 mg | ORAL_TABLET | Freq: Four times a day (QID) | ORAL | Status: DC | PRN
  Administered 2016-10-18 – 2016-10-20 (×2): 650 mg via ORAL
  Filled 2016-10-13 (×2): qty 2

## 2016-10-13 MED ORDER — DIPHENHYDRAMINE HCL (SLEEP) 25 MG PO CAPS: 50.0000 mg | ORAL_CAPSULE | Freq: Every evening | ORAL | Status: DC | PRN

## 2016-10-13 MED ORDER — ENOXAPARIN SODIUM 100 MG/ML ~~LOC~~ SOLN
100.0000 mg | Freq: Every day | SUBCUTANEOUS | Status: DC
  Administered 2016-10-14 – 2016-10-19 (×7): 100 mg via SUBCUTANEOUS
  Filled 2016-10-13 (×7): qty 1

## 2016-10-13 MED ORDER — ONDANSETRON HCL 4 MG PO TABS: 4.0000 mg | ORAL_TABLET | Freq: Four times a day (QID) | ORAL | Status: DC | PRN

## 2016-10-13 MED ORDER — HYDRALAZINE HCL 20 MG/ML IJ SOLN: 5.0000 mg | INTRAMUSCULAR | Status: DC | PRN

## 2016-10-13 MED ORDER — ALBUTEROL SULFATE (2.5 MG/3ML) 0.083% IN NEBU
2.5000 mg | INHALATION_SOLUTION | Freq: Four times a day (QID) | RESPIRATORY_TRACT | Status: DC | PRN
  Administered 2016-10-15 – 2016-10-19 (×5): 2.5 mg via RESPIRATORY_TRACT
  Filled 2016-10-13 (×5): qty 3

## 2016-10-13 MED ORDER — OXYCODONE-ACETAMINOPHEN 5-325 MG PO TABS
2.0000 | ORAL_TABLET | Freq: Four times a day (QID) | ORAL | Status: DC | PRN
  Administered 2016-10-13: 2 via ORAL
  Filled 2016-10-13: qty 2

## 2016-10-13 MED ORDER — ACETAMINOPHEN 650 MG RE SUPP: 650.0000 mg | Freq: Four times a day (QID) | RECTAL | Status: DC | PRN

## 2016-10-13 NOTE — ED Triage Notes (Signed)
Per [facility] patient from home. Patient had pressure bandage on right lower leg placed a week ago. Today home health came out today to do wound care. Upon removing bandage, patient right foot is black, so Home health called EMS.

## 2016-10-13 NOTE — ED Provider Notes (Signed)
[facility] DEPT Provider Note   CSN: [id number] Arrival date & time: 10/13/16  1817  By signing my name below, I, Jodi Underwood, attest that this documentation has been prepared under the direction and in the presence of Jodi Manifold, MD. Electronically Signed: Reola Underwood, ED Scribe. 10/13/16. 8:14 PM.  History   Chief Complaint Chief Complaint  Patient presents with  . infected leg   The history is provided by the patient and medical records. No language interpreter was used.   HPI Comments: Jodi Underwood is a 71 y.o. female BIB EMS, with a PMHx of morbid obesity, DM, lymphedema, HTN, asthma, who presents to the Emergency Department complaining of worsening wounds to the bilateral lower legs beginning approximately one week ago. Per pt, she states that she has a h/o lymphedema to the BLE, but this has recently been worsening. She reports that she has been tolerating compression therapy for this issue; however, one week ago she states that her daughter attempted to re-wrap one of her legs when she noticed several wounds over the area. She was also seen by her PCP one week ago around this time and she states that her physician recommended local wound care with a home nurse, but her wound care nurse arrived today and called EMS for her to come into the ED d/t "black skin changes" that she had noticed. Pt has not recently been on a course of antibiotics for this issue. She mentions that otherwise that she has been feeling towards her baseline, except that her pain associated w/ her neuropathy has been somewhat acutely worsening recently and has extended higher into her legs than normal. She also notes that her leg has been weeping more recently form their baseline. Pt's pain is worse with ambulation and alleviated at rest. Pt denies fever, or any other associated symptoms.   Past Medical History:  Diagnosis Date  . Anemia   . Arthritis   . Asthma   . Degenerative disc  disease   . Diabetes mellitus   . Diabetic neuropathy ([place])   . Hypertension   . Lymphedema   . Pneumonia   . Urinary incontinence    Patient Active Problem List   Diagnosis Date Noted  . Lymphedema of both lower extremities   . Abnormal CT of the abdomen   . Pressure injury of skin 04/28/2016  . Intractable lower abdominal pain 04/27/2016  . Constipation   . Respiratory distress   . Acute cystitis without hematuria   . Sepsis ([place]) 09/26/2014  . UTI (urinary tract infection) 09/26/2014  . Asthma 09/26/2014  . Asthma with acute exacerbation   . Blood poisoning ([place])   . Urinary tract infectious disease   . Diabetes type 2, uncontrolled ([place])   . Acute on chronic renal failure ([place])   . Essential hypertension, benign   . Acute renal failure ([place]) 01/25/2013  . Cellulitis of leg, bilateral 01/23/2013  . Ulcers of both lower extremities ([place]) 01/23/2013  . Lymphedema 06/13/2011  . DM type 2, uncontrolled, with neuropathy ([place]) 06/23/2010  . Morbid obesity ([place]) 06/23/2010  . ANEMIA-NOS 06/23/2010  . Essential hypertension 06/23/2010  . Asthma 06/23/2010   Past Surgical History:  Procedure Laterality Date  . ABDOMINAL HYSTERECTOMY     COMPLETE   OB History    No data available     Home Medications    Prior to Admission medications   Medication Sig Start Date End Date Taking? Authorizing Provider  albuterol (PROVENTIL HFA;VENTOLIN HFA)  108 (90 BASE) MCG/ACT inhaler Inhale 2 puffs into the lungs every 6 (six) hours as needed. 07/24/11   [name], MD  carbamazepine (CARBATROL) 300 MG 12 hr capsule Take 1 capsule by mouth 2 (two) times daily. 01/30/16   Historical Provider, MD  cephALEXin (KEFLEX) 500 MG capsule Take 1 capsule (500 mg total) by mouth every 12 (twelve) hours. Patient not taking: Reported on 05/16/2016 05/02/16   [name], MD  furosemide (LASIX) 40 MG tablet Take 1 tablet (40 mg total) by mouth daily. 10/04/14   [name], MD  insulin aspart  (NOVOLOG) 100 UNIT/ML injection Inject 8 Units into the skin 3 (three) times daily with meals. Patient not taking: Reported on 05/16/2016 05/01/16   [name], MD  metoprolol tartrate (LOPRESSOR) 25 MG tablet Take 1 tablet (25 mg total) by mouth 2 (two) times daily. Patient not taking: Reported on 05/16/2016 05/01/16   [name], MD  NOVOLIN N RELION 100 UNIT/ML injection Inject 50 Units into the skin 2 (two) times daily before a meal.  03/21/16   Historical Provider, MD  traMADol (ULTRAM) 50 MG tablet Take 2 tablets (100 mg total) by mouth every 6 (six) hours as needed for severe pain. 05/01/16   [name], MD  valsartan (DIOVAN) 80 MG tablet Take 1 tablet by mouth daily. 03/06/16   Historical Provider, MD   Family History Family History  Problem Relation Age of Onset  . Adopted: Yes   Social History Social History  Substance Use Topics  . Smoking status: Never Smoker  . Smokeless tobacco: Never Used  . Alcohol use Yes     Comment: occassionally   Allergies   Patient has no known allergies.  Review of Systems Review of Systems  Constitutional: Negative for fever.  Cardiovascular: Positive for leg swelling (baseline d/t lymphedema).  Musculoskeletal: Positive for myalgias.  Skin: Positive for color change and wound.  All other systems reviewed and are negative.  Physical Exam Updated Vital Signs BP (!) 153/75 (BP Location: Right Arm)   Pulse (!) 104   Temp 97.5 F (36.4 C) (Oral)   Resp 16   Wt (!) 450 lb (204.1 kg)   SpO2 99%   BMI 72.63 kg/m   Physical Exam  Constitutional: She appears well-developed and well-nourished.  HENT:  Head: Normocephalic.  Right Ear: External ear normal.  Left Ear: External ear normal.  Nose: Nose normal.  Mouth/Throat: Oropharynx is clear and moist.  Eyes: Conjunctivae are normal. Right eye exhibits no discharge. Left eye exhibits no discharge.  Neck: Normal range of motion.  Cardiovascular: Normal rate, regular rhythm, normal  heart sounds and intact distal pulses.   No murmur heard. Pulmonary/Chest: Effort normal and breath sounds normal. No respiratory distress. She has no wheezes. She has no rales.  Abdominal: Soft. She exhibits no distension. There is no tenderness. There is no rebound and no guarding.  Musculoskeletal: Normal range of motion. She exhibits tenderness. She exhibits no edema.  Severe bilateral lymphedema, maceration b/t skin folds. Erythema, increased warmth and tenderness to b/l lower legs. No abscess.   Neurological: She is alert. No cranial nerve deficit. Coordination normal.  Skin: Skin is warm and dry. No rash noted. There is erythema. No pallor.  Psychiatric: She has a normal mood and affect. Her behavior is normal.  Nursing note and vitals reviewed.  ED Treatments / Results  DIAGNOSTIC STUDIES: Oxygen Saturation is 99% on RA, normal by my interpretation.   COORDINATION OF CARE: 8:12  PM-Discussed next steps with pt. Pt verbalized understanding and is agreeable with the plan.   Labs (all labs ordered are listed, but only abnormal results are displayed) Labs Reviewed  CBC WITH DIFFERENTIAL/PLATELET  BASIC METABOLIC PANEL   EKG  EKG Interpretation None      Radiology No results found.  Procedures Procedures   Medications Ordered in ED Medications - No data to display  Initial Impression / Assessment and Plan / ED Course  I have reviewed the triage vital signs and the nursing notes.  Pertinent labs & imaging results that were available during my care of the patient were reviewed by me and considered in my medical decision making (see chart for details).     71 year old female with what is clinically cellulitis in the setting severe/chronic lymphedema. She has dopplerable pulses in both feet. Final Clinical Impressions(s) / ED Diagnoses   Final diagnoses:  Lower leg edema  Essential hypertension  Cellulits of b/l LE  New Prescriptions New Prescriptions   No  medications on file   I personally preformed the services scribed in my presence. The recorded information has been reviewed is accurate. Jodi Manifold, MD.      Jodi Manifold, MD 10/16/16 [id number]

## 2016-10-13 NOTE — ED Notes (Signed)
Bed: RESA Expected date: 10/13/16 Expected time: 6:19 PM Means of arrival:  Comments: Decubitus on body, obesity, Sizewise bed requested

## 2016-10-13 NOTE — H&P (Signed)
History and Physical    Jodi Underwood MRN:[id number] DOB: 09/26/1945 DOA: 10/13/2016  Referring MD/NP/PA:   PCP: TAHERI, ARASH, MD   Patient coming from:  The patient is coming from home.  At baseline, pt is partially dependent for most of ADL.   Chief Complaint: Leg pain  HPI: Jodi Underwood is a 71 y.o. female with medical history significant of morbid obesity, lymphedema, hypertension, diabetes mellitus, asthma, anemia, neuropathy, urinary incontinence, who presents with leg pain.  Patient states that she has bilateral lower leg pain since Wednesday. The AP is constantly, 8/10, worsening on the right lower leg. Pt states that her daughter noticed several wounds over her lower legs on Wednesday. She was seen by PCP on Thursday, but it did not give her prescription of antibiotics. He PCP recommended local wound care with a home nurse, but her wound care nurse noticed "black skin changes" and called EMS. Patient also states that her bilateral leg edema has been worsening and her leg has been weeping more recently form their baseline. Patient denies any fever, chills, chest pain, shortness breath, nausea, vomiting, diarrhea, abdominal pain, symptoms of UTI. No unilateral weakness.  ED Course: pt was found to have WBC 8.5, lactate 1.2, acute renal injury with creatinine 1.46, and a urinalysis, temperature normal, tachycardia, oxygen saturation 99% on room air. Patient is admitted to telemetry bed as inpatient  Review of Systems:   General: no fevers, chills, no changes in body weight, has fatigue HEENT: no blurry vision, hearing changes or sore throat Respiratory: no dyspnea, coughing, wheezing CV: no chest pain, no palpitations GI: no nausea, vomiting, abdominal pain, diarrhea, constipation GU: no dysuria, burning on urination, increased urinary frequency, hematuria  Ext: has leg edema and pain Neuro: no unilateral weakness, numbness, or tingling, no vision change or hearing  loss Skin: no rash, no skin tear. MSK: No muscle spasm, no deformity, no limitation of range of movement in spin Heme: No easy bruising.  Travel history: No recent long distant travel.  Allergy: No Known Allergies  Past Medical History:  Diagnosis Date  . Anemia   . Arthritis   . Asthma   . Degenerative disc disease   . Diabetes mellitus   . Diabetic neuropathy ([facility])   . Hypertension   . Lymphedema   . Pneumonia   . Urinary incontinence     Past Surgical History:  Procedure Laterality Date  . ABDOMINAL HYSTERECTOMY     COMPLETE    Social History:  reports that she has never smoked. She has never used smokeless tobacco. She reports that she drinks alcohol. She reports that she does not use drugs.  Family History:  Family History  Problem Relation Age of Onset  . Adopted: Yes     Prior to Admission medications   Medication Sig Start Date End Date Taking? Authorizing Provider  albuterol (PROVENTIL HFA;VENTOLIN HFA) 108 (90 BASE) MCG/ACT inhaler Inhale 2 puffs into the lungs every 6 (six) hours as needed. 07/24/11  Yes Katherine E Tabori, MD  DiphenhydrAMINE HCl, Sleep, (ZZZQUIL) 25 MG CAPS Take 50 mg by mouth at bedtime as needed (sleep).   Yes Historical Provider, MD  naproxen sodium (ANAPROX) 220 MG tablet Take 440 mg by mouth 2 (two) times daily with a meal.   Yes Historical Provider, MD  NOVOLIN N RELION 100 UNIT/ML injection Inject 50 Units into the skin 2 (two) times daily before a meal.  03/21/16  Yes Historical Provider, MD  traMADol (ULTRAM) 50 MG tablet Take   2 tablets (100 mg total) by mouth every 6 (six) hours as needed for severe pain. 05/01/16  Yes [name], MD  valsartan (DIOVAN) 80 MG tablet Take 80 mg by mouth daily.  03/06/16  Yes Historical Provider, MD  cephALEXin (KEFLEX) 500 MG capsule Take 1 capsule (500 mg total) by mouth every 12 (twelve) hours. Patient not taking: Reported on 05/16/2016 05/02/16   [name], MD  furosemide (LASIX) 40 MG tablet  Take 1 tablet (40 mg total) by mouth daily. Patient not taking: Reported on 10/13/2016 10/04/14   [name], MD  insulin aspart (NOVOLOG) 100 UNIT/ML injection Inject 8 Units into the skin 3 (three) times daily with meals. Patient not taking: Reported on 05/16/2016 05/01/16   [name], MD  metoprolol tartrate (LOPRESSOR) 25 MG tablet Take 1 tablet (25 mg total) by mouth 2 (two) times daily. Patient not taking: Reported on 05/16/2016 05/01/16   [name], MD    Physical Exam: Vitals:   10/13/16 1930 10/13/16 2100 10/13/16 2240 10/13/16 2343  BP: (!) 151/90 (!) 139/55 (!) 146/57 (!) 161/48  Pulse: (!) 107 98 (!) 111 (!) 110  Resp:   16 20  Temp:    98.3 F (36.8 C)  TempSrc:    Oral  SpO2: 97% 94% 99% 97%  Weight:       General: Not in acute distress HEENT:       Eyes: PERRL, EOMI, no scleral icterus.       ENT: No discharge from the ears and nose, no pharynx injection, no tonsillar enlargement.        Neck: No JVD, no bruit, no mass felt. Heme: No neck lymph node enlargement. Cardiac: S1/S2, RRR, No murmurs, No gallops or rubs. Respiratory: No rales, wheezing, rhonchi or rubs. GI: Soft, nondistended, nontender, no rebound pain, no organomegaly, BS present. GU: No hematuria Ext: Difficult to feel the DP/PT pulse bilaterally, but per EDP, Dr. Wilson Singer pt's pulses are easily detectable by Doppler. Has severe bilateral lymphedema in legs, maceration b/t skin folds, with erythema, warmth and tenderness to b/l lower legs, worse on the right leg and foot. No abscess.   Musculoskeletal: No joint deformities, No joint redness or warmth, no limitation of ROM in spin. Skin: No rashes.  Neuro: Alert, oriented X3, cranial nerves II-XII grossly intact, moves all extremities normally. Psych: Patient is not psychotic, no suicidal or hemocidal ideation.  Labs on Admission: I have personally reviewed following labs and imaging studies  CBC:  Recent Labs Lab 10/13/16 1958  WBC 8.5   NEUTROABS 5.4  HGB 11.9*  HCT 38.5  MCV 85.0  PLT 128   Basic Metabolic Panel:  Recent Labs Lab 10/13/16 1958  NA 139  K 4.5  CL 103  CO2 28  GLUCOSE 140*  BUN 24*  CREATININE 1.46*  CALCIUM 9.4   GFR: Estimated Creatinine Clearance: 65.4 mL/min (A) (by C-G formula based on SCr of 1.46 mg/dL (H)). Liver Function Tests: No results for input(s): AST, ALT, ALKPHOS, BILITOT, PROT, ALBUMIN in the last 168 hours. No results for input(s): LIPASE, AMYLASE in the last 168 hours. No results for input(s): AMMONIA in the last 168 hours. Coagulation Profile: No results for input(s): INR, PROTIME in the last 168 hours. Cardiac Enzymes: No results for input(s): CKTOTAL, CKMB, CKMBINDEX, TROPONINI in the last 168 hours. BNP (last 3 results) No results for input(s): PROBNP in the last 8760 hours. HbA1C: No results for input(s): HGBA1C in the last 72 hours. CBG:  Recent Labs Lab 10/13/16 2350  GLUCAP 145*   Lipid Profile: No results for input(s): CHOL, HDL, LDLCALC, TRIG, CHOLHDL, LDLDIRECT in the last 72 hours. Thyroid Function Tests: No results for input(s): TSH, T4TOTAL, FREET4, T3FREE, THYROIDAB in the last 72 hours. Anemia Panel: No results for input(s): VITAMINB12, FOLATE, FERRITIN, TIBC, IRON, RETICCTPCT in the last 72 hours. Urine analysis:    Component Value Date/Time   COLORURINE YELLOW 04/27/2016 2027   APPEARANCEUR CLOUDY (A) 04/27/2016 2027   LABSPEC 1.017 04/27/2016 2027   PHURINE 6.0 04/27/2016 2027   GLUCOSEU NEGATIVE 04/27/2016 2027   HGBUR MODERATE (A) 04/27/2016 2027   BILIRUBINUR NEGATIVE 04/27/2016 2027   KETONESUR NEGATIVE 04/27/2016 2027   PROTEINUR 30 (A) 04/27/2016 2027   UROBILINOGEN 0.2 09/29/2014 1224   NITRITE POSITIVE (A) 04/27/2016 2027   LEUKOCYTESUR MODERATE (A) 04/27/2016 2027   Sepsis Labs: @LABRCNTIP(procalcitonin:4,lacticidven:4) )No results found for this or any previous visit (from the past 240 hour(s)).   Radiological Exams on  Admission: No results found.   EKG: Independently reviewed. Sinus rhythm, QTC 457, nonspecific T-wave change.   Assessment/Plan Principal Problem:   Cellulitis of leg, bilateral Active Problems:   DM type 2, uncontrolled, with neuropathy (HCC)   Morbid obesity (HCC)   Essential hypertension   Asthma   Lymphedema   AKI (acute kidney injury) (HCC)   Bilateral leg cellulitis (worse on the right leg) in the setting of severe chronic lymphedema: pt is not septic. Lactic acid is normal. Hemodynamically stable  - will admit to tele bed as inpt - Empiric antimicrobial treatment with rocephine - PRN Percocet for pain - Blood cultures x 2  - ESR and CRP - wound care consult -LE doppler to r/o DVT  DM-II: Last A1c 6.6 on 04/30/16, well controled. Patient is taking novolin one or bid or per day at home -SSI  AKI: Likely due to prerenal secondary to dehydration and continuation of ARB, diruetics, NSAIDs - Check FeUrea - Follow up renal function by BMP - Hold lasix, naproxen and valsartan  HTN: -hold Valsartan and Lasix due to AKI -start amlodipine -IV hydralazine when necessary  Asthma: stable -prn albuterol nebs.   DVT ppx:   SQ Lovenox Code Status: Full code Family Communication:   Yes, patient's daughter at bed side Disposition Plan:  Anticipate discharge back to previous home environment Consults called:  none Admission status: inpatient/tele      Date of Service 10/14/2016    ,  [facility] Pager [phone]  If 7PM-7AM, please contact night-coverage [email or web address] Password [email or web address] 10/14/2016, 2:39 AM  

## 2016-10-13 NOTE — Progress Notes (Signed)
Lovenox per Pharmacy for DVT Prophylaxis    Pharmacy has been consulted from dosing enoxaparin (lovenox) in this patient for DVT prophylaxis.  The pharmacist has reviewed pertinent labs (Hgb _11.9__; PLT_307__), patient weight (__204_kg) and renal function (CrCl_65__mL/min) and decided that enoxaparin _100_mg SQ Q24Hrs is appropriate for this patient.  The pharmacy department will sign off at this time.  Please reconsult pharmacy if status changes or for further issues.  Thank you  Cyndia Diver PharmD, BCPS  10/13/2016, 11:06 PM

## 2016-10-14 ENCOUNTER — Inpatient Hospital Stay (HOSPITAL_COMMUNITY): Payer: Medicare Other

## 2016-10-14 DIAGNOSIS — R600 Localized edema: Secondary | ICD-10-CM

## 2016-10-14 LAB — GLUCOSE, CAPILLARY
GLUCOSE-CAPILLARY: 128 mg/dL — AB (ref 65–99)
GLUCOSE-CAPILLARY: 141 mg/dL — AB (ref 65–99)
GLUCOSE-CAPILLARY: 145 mg/dL — AB (ref 65–99)
GLUCOSE-CAPILLARY: 146 mg/dL — AB (ref 65–99)
GLUCOSE-CAPILLARY: 193 mg/dL — AB (ref 65–99)

## 2016-10-14 LAB — CBC
HEMATOCRIT: 33.3 % — AB (ref 36.0–46.0)
Hemoglobin: 10.4 g/dL — ABNORMAL LOW (ref 12.0–15.0)
MCH: 26.6 pg (ref 26.0–34.0)
MCHC: 31.2 g/dL (ref 30.0–36.0)
MCV: 85.2 fL (ref 78.0–100.0)
Platelets: 303 10*3/uL (ref 150–400)
RBC: 3.91 MIL/uL (ref 3.87–5.11)
RDW: 14.3 % (ref 11.5–15.5)
WBC: 8.4 10*3/uL (ref 4.0–10.5)

## 2016-10-14 LAB — C-REACTIVE PROTEIN: CRP: 1.2 mg/dL — ABNORMAL HIGH (ref <1.0)

## 2016-10-14 LAB — BASIC METABOLIC PANEL
ANION GAP: 8 (ref 5–15)
BUN: 21 mg/dL — AB (ref 6–20)
CALCIUM: 8.9 mg/dL (ref 8.9–10.3)
CO2: 26 mmol/L (ref 22–32)
Chloride: 104 mmol/L (ref 101–111)
Creatinine, Ser: 1.28 mg/dL — ABNORMAL HIGH (ref 0.44–1.00)
GFR calc Af Amer: 48 mL/min — ABNORMAL LOW (ref >60)
GFR, EST NON AFRICAN AMERICAN: 41 mL/min — AB (ref >60)
GLUCOSE: 138 mg/dL — AB (ref 65–99)
POTASSIUM: 3.9 mmol/L (ref 3.5–5.1)
SODIUM: 138 mmol/L (ref 135–145)

## 2016-10-14 LAB — LACTIC ACID, PLASMA: LACTIC ACID, VENOUS: 1.1 mmol/L (ref 0.5–1.9)

## 2016-10-14 LAB — MAGNESIUM: Magnesium: 1.6 mg/dL — ABNORMAL LOW (ref 1.7–2.4)

## 2016-10-14 MED ORDER — SODIUM CHLORIDE 0.9 % IV SOLN
INTRAVENOUS | Status: DC
  Administered 2016-10-14: 12:00:00 via INTRAVENOUS

## 2016-10-14 MED ORDER — AMLODIPINE BESYLATE 10 MG PO TABS
10.0000 mg | ORAL_TABLET | Freq: Every day | ORAL | Status: DC
  Administered 2016-10-14 – 2016-10-19 (×6): 10 mg via ORAL
  Filled 2016-10-14 (×6): qty 1

## 2016-10-14 MED ORDER — AMLODIPINE BESYLATE 10 MG PO TABS: 10.0000 mg | ORAL_TABLET | Freq: Every day | ORAL | Status: DC

## 2016-10-14 NOTE — Progress Notes (Signed)
*  PRELIMINARY RESULTS* Vascular Ultrasound Bilateral lower extremity venous duplex has been completed.  Preliminary findings: VERY LIMITED exam, non diagnostic. Unable to visualize most lower extremity veins due to patient body habitus and pain tolerance, visualized portions appear negative for thrombosis.  Jodi Underwood 10/14/2016, 12:44 PM

## 2016-10-14 NOTE — Progress Notes (Signed)
Pt was lying in bed and awake when I arrived. She confirmed she had requested prayer and asked specifically for prayer for her family, her health, and the world. She was very appreciative of prayer and visit. Please page if additional assistance is needed (as offered). [place], M.Div   10/14/16 1900  Clinical Encounter Type  Visited With Patient

## 2016-10-14 NOTE — Progress Notes (Signed)
Skin assessment limited due to patient non-compliant. Scream with every movement.

## 2016-10-14 NOTE — Progress Notes (Signed)
PROGRESS NOTE    Jodi Underwood  ZHG:[id number] DOB: [birth year] DOA: 10/13/2016 PCP: Lindie Spruce, MD  Brief Narrative:  Jodi Underwood is a 71 y.o. female with medical history significant of morbid obesity, lymphedema, hypertension, diabetes mellitus, asthma, anemia, neuropathy, urinary incontinence, who presents with leg pain. Patient states that she has bilateral lower leg pain since Wednesday. The pain is constantly, 8/10, worsening on the right lower leg. Pt states that her daughter noticed several wounds over her lower legs on Wednesday. She was seen by PCP on Thursday, but it did not give her prescription of antibiotics. He PCP recommended local wound care with a home nurse, but her wound care nurse noticed "black skin changes" and called EMS. Patient also states that her bilateral leg edema has been worsening and her leg has been weeping more recently form their baseline. Patient denies any fever, chills, chest pain, shortness breath, nausea, vomiting, diarrhea, abdominal pain, symptoms of UTI. No unilateral weakness. Was admitted for a suspected Cellulitis in the setting of severe chronic lymphedema.   Assessment & Plan:   Principal Problem:   Cellulitis of leg, bilateral Active Problems:   DM type 2, uncontrolled, with neuropathy ([place])   Morbid obesity ([place])   Essential hypertension   Asthma   Lymphedema   AKI (acute kidney injury) ([place])  Bilateral leg cellulitis (worse on the right leg) in the setting of severe chronic Lymphedema: -Pt is not septic. Lactic acid is normal at 1.1. Hemodynamically stable -Will admit to tele bed as inpt -C/w Empiric antimicrobial treatment with IV Ceftriaxone and consider changing if not improving -PRN Oxycodone-Acetaminophen 2 tablets po q6hprn for Severe Pain -Blood cultures x 2 done and pending -ESR is elevated at 45 and CRP is slightly high at 1.2 -Wound care consult Appreciated -Procalcition was 0.18 -LE Venous Doppler was unable  to visualize most lower extremity veins due to patient body habitus and pain tolerance, visualized portions appear negative for thrombosis -Will culture leg drainage -Consider obtaining Imaging of Leg to r/o Osteomyelitis  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  -Patient is taking Novolin N 50 units bid per day at home -Will start patient on Sensitive Novolog SSI AC HS -CBG's ranging from 141-193  AKI:  -Likely due to prerenal secondary to dehydration and Continuation of ARB, diruetics, NSAIDs -BUN/Cr went from 24/1.46 -> 21/1.28 -Will start gentle IVF Rehydration with NS at 50 mL/hr -Check FeUrea -Hold and Avoid Nephrotoxic Medications including Lasix 40 mg daily, Naproxen 440 mg po BID and Valsartan 80- mg po Daily -Repeat CMP in AM  HTN: -Hold Valsartan and Lasix due to AKI -Start Amlodipine 10 mg po Daily qHS -IV Hydralazine 5 mg IV q2hprn for SBP >175  Asthma:  -Stable -C/w prn Albuterol Nebs 2.5 mg q6hprn for Wheezing or SOB.  Morbid Obesity -Patient's BMI was 72.8 -Weight Loss Counseling given -Nutritionist Consultation Appreciated  Normocytic Anemia -Patient's Hb/Hct went from 11.9/38.5 -> 10.4/33.3 -Continue to Monitor CBC's  DVT prophylaxis: Lovenox 100 mg sq q24h Code Status: FULL CODE Family Communication: No family present at bedside Disposition Plan: Remain Inpatient; Will get PT evaluation  Consultants:   None   Procedures: Bilateral Lower Extremity Venous Duplex Preliminary findings: VERY LIMITED exam, non diagnostic. Unable to visualize most lower extremity veins due to patient body habitus and pain tolerance, visualized portions appear negative for thrombosis.   Antimicrobials:  Anti-infectives    Start     Dose/Rate Route Frequency Ordered Stop  10/14/16 2000  cefTRIAXone (ROCEPHIN) 1 g in dextrose 5 % 50 mL IVPB     1 g 100 mL/hr over 30 Minutes Intravenous Every 24 hours 10/13/16 2156     10/13/16  2030  cefTRIAXone (ROCEPHIN) 1 g in dextrose 5 % 50 mL IVPB     1 g 100 mL/hr over 30 Minutes Intravenous  Once 10/13/16 2025 10/13/16 2136     Subjective: Seen and examined at bedside and states her legs were extremely painful and couldn't move them as well. Had severe pain and swelling in legs and states Right leg started to smell. No nausea, vomiting, or chest pain. No other concerns or complaints at this time.   Objective: Vitals:   10/13/16 2100 10/13/16 2240 10/13/16 2343 10/14/16 0642  BP: (!) 139/55 (!) 146/57 (!) 161/48 (!) 165/54  Pulse: 98 (!) 111 (!) 110 (!) 105  Resp:  _0 Temp:   98.3 F (36.8 C) 98.8 F (37.1 C)  TempSrc:   Oral Oral  SpO2: 94% 99% 97% 99%  Weight:   (!) 204.1 kg (449 lb 15.3 oz)   Height:   _1  (1.676 m)     Intake/Output Summary (Last 24 hours) at 10/14/16 1301 Last data filed at 10/13/16 2136  Gross per 24 hour  Intake               50 ml  Output                0 ml  Net               50 ml   Filed Weights   10/13/16 1822 10/13/16 2343  Weight: (!) 204.1 kg (450 lb) (!) 204.1 kg (449 lb 15.3 oz)   Examination: Physical Exam:  Constitutional: Morbidly obese Female who WN/WD, NAD and appears calm but uncomfortable Eyes: Lids and conjunctivae normal, sclerae anicteric  ENMT: External Ears, Nose appear normal. Neck: Appears normal, supple, no cervical masses, normal ROM, no appreciable thyromegaly, no JVD Respiratory: Diminished to auscultation bilaterally, no wheezing, rales, rhonchi or crackles. Normal respiratory effort and patient is not tachypenic. No accessory muscle use.  Cardiovascular: RRR, no murmurs / rubs / gallops. S1 and S2 auscultated. Severe Bilateral Lymphedema with skin erythema and maceration between skin folds. F  Abdomen: Soft, non-tender, distended due to body habitus. No masses palpated. No appreciable hepatosplenomegaly. Bowel sounds positive x4.   GU: Deferred. Musculoskeletal: No clubbing / cyanosis of  digits/nails. No joint deformity upper and lower extremities. ROM limited due to pain Skin: severe bilateral lymphedema with swelling, warmth, tenderness with skin changes and slight breakdown. No weeping appreciated but patient had a foul odor coming from right leg.   Neurologic: CN 2-12 grossly intact with no focal deficits. Romberg sign cerebellar reflexes not assessed.  Psychiatric: Normal judgment and insight. Alert and oriented x 3. Normal mood and appropriate affect.   Data Reviewed: I have personally reviewed following labs and imaging studies  CBC:  Recent Labs Lab 10/13/16 1958 10/14/16 0510  WBC 8.5 8.4  NEUTROABS 5.4  --   HGB 11.9* 10.4*  HCT 38.5 33.3*  MCV 85.0 85.2  PLT 307 086   Basic Metabolic Panel:  Recent Labs Lab 10/13/16 1958 10/14/16 0510  NA 139 138  K 4.5 3.9  CL 103 104  CO2 28 26  GLUCOSE 140* 138*  BUN 24* 21*  CREATININE 1.46* 1.28*  CALCIUM 9.4 8.9   GFR: Estimated Creatinine  Clearance: 74.6 mL/min (A) (by C-G formula based on SCr of 1.28 mg/dL (H)). Liver Function Tests: No results for input(s): AST, ALT, ALKPHOS, BILITOT, PROT, ALBUMIN in the last 168 hours. No results for input(s): LIPASE, AMYLASE in the last 168 hours. No results for input(s): AMMONIA in the last 168 hours. Coagulation Profile: No results for input(s): INR, PROTIME in the last 168 hours. Cardiac Enzymes: No results for input(s): CKTOTAL, CKMB, CKMBINDEX, TROPONINI in the last 168 hours. BNP (last 3 results) No results for input(s): PROBNP in the last 8760 hours. HbA1C: No results for input(s): HGBA1C in the last 72 hours. CBG:  Recent Labs Lab 10/13/16 2350 10/14/16 0849 10/14/16 1124  GLUCAP 145* 141* 193*   Lipid Profile: No results for input(s): CHOL, HDL, LDLCALC, TRIG, CHOLHDL, LDLDIRECT in the last 72 hours. Thyroid Function Tests: No results for input(s): TSH, T4TOTAL, FREET4, T3FREE, THYROIDAB in the last 72 hours. Anemia Panel: No results for  input(s): VITAMINB12, FOLATE, FERRITIN, TIBC, IRON, RETICCTPCT in the last 72 hours. Sepsis Labs:  Recent Labs Lab 10/13/16 2225 10/14/16 0108  PROCALCITON 0.18  --   LATICACIDVEN 1.2 1.1   No results found for this or any previous visit (from the past 240 hour(s)).   Radiology Studies: No results found.  Scheduled Meds: . amLODipine  10 mg Oral QHS  . cefTRIAXone (ROCEPHIN)  IV  1 g Intravenous Q24H  . enoxaparin (LOVENOX) injection  100 mg Subcutaneous QHS  . insulin aspart  0-5 Units Subcutaneous QHS  . insulin aspart  0-9 Units Subcutaneous TID WC  . sodium chloride flush  3 mL Intravenous Q12H   Continuous Infusions: . sodium chloride 50 mL/hr at 10/14/16 1147    LOS: 1 day   Kerney Elbe, DO [facility] Pager [phone]  If 7PM-7AM, please contact night-coverage [email or web address] Password [email or web address] 10/14/2016, 1:01 PM

## 2016-10-15 LAB — COMPREHENSIVE METABOLIC PANEL
ALK PHOS: 55 U/L (ref 38–126)
ALT: 17 U/L (ref 14–54)
AST: 22 U/L (ref 15–41)
Albumin: 2.7 g/dL — ABNORMAL LOW (ref 3.5–5.0)
Anion gap: 6 (ref 5–15)
BUN: 16 mg/dL (ref 6–20)
CHLORIDE: 105 mmol/L (ref 101–111)
CO2: 28 mmol/L (ref 22–32)
Calcium: 8.6 mg/dL — ABNORMAL LOW (ref 8.9–10.3)
Creatinine, Ser: 1.05 mg/dL — ABNORMAL HIGH (ref 0.44–1.00)
GFR, EST NON AFRICAN AMERICAN: 52 mL/min — AB (ref >60)
Glucose, Bld: 162 mg/dL — ABNORMAL HIGH (ref 65–99)
Potassium: 4.1 mmol/L (ref 3.5–5.1)
SODIUM: 139 mmol/L (ref 135–145)
TOTAL PROTEIN: 5.7 g/dL — AB (ref 6.5–8.1)
Total Bilirubin: 0.7 mg/dL (ref 0.3–1.2)

## 2016-10-15 LAB — GLUCOSE, CAPILLARY
GLUCOSE-CAPILLARY: 195 mg/dL — AB (ref 65–99)
GLUCOSE-CAPILLARY: 172 mg/dL — AB (ref 65–99)
Glucose-Capillary: 150 mg/dL — ABNORMAL HIGH (ref 65–99)
Glucose-Capillary: 165 mg/dL — ABNORMAL HIGH (ref 65–99)

## 2016-10-15 LAB — PHOSPHORUS: PHOSPHORUS: 3.3 mg/dL (ref 2.5–4.6)

## 2016-10-15 LAB — CBC WITH DIFFERENTIAL/PLATELET
BASOS ABS: 0 10*3/uL (ref 0.0–0.1)
Basophils Relative: 1 %
EOS PCT: 3 %
Eosinophils Absolute: 0.3 10*3/uL (ref 0.0–0.7)
HCT: 33.1 % — ABNORMAL LOW (ref 36.0–46.0)
HEMOGLOBIN: 10 g/dL — AB (ref 12.0–15.0)
LYMPHS ABS: 2.6 10*3/uL (ref 0.7–4.0)
LYMPHS PCT: 33 %
MCH: 25.1 pg — AB (ref 26.0–34.0)
MCHC: 30.2 g/dL (ref 30.0–36.0)
MCV: 83 fL (ref 78.0–100.0)
Monocytes Absolute: 0.9 10*3/uL (ref 0.1–1.0)
Monocytes Relative: 12 %
NEUTROS PCT: 51 %
Neutro Abs: 4 10*3/uL (ref 1.7–7.7)
PLATELETS: 281 10*3/uL (ref 150–400)
RBC: 3.99 MIL/uL (ref 3.87–5.11)
RDW: 13.9 % (ref 11.5–15.5)
WBC: 7.8 10*3/uL (ref 4.0–10.5)

## 2016-10-15 MED ORDER — SALINE SPRAY 0.65 % NA SOLN
1.0000 | NASAL | Status: DC | PRN
  Filled 2016-10-15: qty 44

## 2016-10-15 MED ORDER — MAGNESIUM SULFATE 2 GM/50ML IV SOLN
2.0000 g | Freq: Once | INTRAVENOUS | Status: AC
  Administered 2016-10-15: 2 g via INTRAVENOUS
  Filled 2016-10-15: qty 50

## 2016-10-15 MED ORDER — DM-GUAIFENESIN ER 30-600 MG PO TB12
1.0000 | ORAL_TABLET | Freq: Two times a day (BID) | ORAL | Status: DC
  Administered 2016-10-15 – 2016-10-20 (×11): 1 via ORAL
  Filled 2016-10-15 (×11): qty 1

## 2016-10-15 NOTE — Progress Notes (Signed)
PROGRESS NOTE    Jodi Underwood  ACZ:[id number] DOB: [birth year] DOA: 10/13/2016 PCP: Lindie Spruce, MD  Brief Narrative:  Jodi Underwood is a 71 y.o. female with medical history significant of morbid obesity, lymphedema, hypertension, diabetes mellitus, asthma, anemia, neuropathy, urinary incontinence, who presents with leg pain. Patient states that she has bilateral lower leg pain since Wednesday. The pain is constantly, 8/10, worsening on the right lower leg. Pt states that her daughter noticed several wounds over her lower legs on Wednesday. She was seen by PCP on Thursday, but it did not give her prescription of antibiotics. He PCP recommended local wound care with a home nurse, but her wound care nurse noticed "black skin changes" and called EMS. Patient also states that her bilateral leg edema has been worsening and her leg has been weeping more recently form their baseline. Patient denies any fever, chills, chest pain, shortness breath, nausea, vomiting, diarrhea, abdominal pain, symptoms of UTI. No unilateral weakness. Was admitted for a suspected Cellulitis in the setting of severe chronic lymphedema. States she felt a little better today.   Assessment & Plan:   Principal Problem:   Cellulitis of leg, bilateral Active Problems:   DM type 2, uncontrolled, with neuropathy ([place])   Morbid obesity ([place])   Essential hypertension   Asthma   Lymphedema   AKI (acute kidney injury) ([place])  Bilateral leg cellulitis (worse on the right leg) in the setting of severe chronic Lymphedema: -Pt is not septic. Lactic acid is normal at 1.1. Hemodynamically stable -C/w Empiric antimicrobial treatment with IV Ceftriaxone and consider changing if not improving -PRN Oxycodone-Acetaminophen 2 tablets po q6hprn for Severe Pain -Blood cultures x 2 done and showed NGTD x 1 day -ESR is elevated at 45 and CRP is slightly high at 1.2 -Wound care consult Appreciated and waiting for  evaluation -Procalcition was 0.18 -LE Venous Doppler was unable to visualize most lower extremity veins due to patient body habitus and pain tolerance, visualized portions appear negative for thrombosis -Will culture leg drainage -Will Consider obtaining Imaging of Leg to r/o Osteomyelitis  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  -Patient is taking Novolin N 50 units bid per day at home -Will start patient on Sensitive Novolog SSI AC HS -CBG's ranging from 128-195  AKI:  -Likely due to prerenal secondary to dehydration and Continuation of ARB, diruetics, NSAIDs -BUN/Cr went from 24/1.46 -> 21/1.28 -> 16/1.05 -Will D/C IVF Rehydration with NS at 50 mL/hr -Hold and Avoid Nephrotoxic Medications including Lasix 40 mg daily, Naproxen 440 mg po BID and Valsartan 80- mg po Daily -Repeat CMP in AM  HTN: -Hold Valsartan and Lasix due to AKI -C/w Amlodipine 10 mg po Daily qHS -IV Hydralazine 5 mg IV q2hprn for SBP >175  Asthma:  -Stable -C/w prn Albuterol Nebs 2.5 mg q6hprn for Wheezing or SOB.  Morbid Obesity -Patient's BMI was 72.8 -Weight Loss Counseling given -Nutritionist Consultation Appreciated  Normocytic Anemia -Patient's Hb/Hct went from 11.9/38.5 -> 10.4/33.3 -> 10.0/33.1 -Continue to Monitor CBC's  Hypomagnesemia -Mag Level was 1.6 -Replete with IV Mag Sulfate -Repeat Mag Level in AM  DVT prophylaxis: Lovenox 100 mg sq q24h Code Status: FULL CODE Family Communication: No family present at bedside Disposition Plan: Remain Inpatient; Will get PT evaluation  Consultants:   None   Procedures: Bilateral Lower Extremity Venous Duplex Preliminary findings: VERY LIMITED exam, non diagnostic. Unable to visualize most lower extremity veins due to patient body habitus and pain  tolerance, visualized portions appear negative for thrombosis.   Antimicrobials:  Anti-infectives    Start     Dose/Rate Route Frequency Ordered Stop    10/14/16 2000  cefTRIAXone (ROCEPHIN) 1 g in dextrose 5 % 50 mL IVPB     1 g 100 mL/hr over 30 Minutes Intravenous Every 24 hours 10/13/16 2156     10/13/16 2030  cefTRIAXone (ROCEPHIN) 1 g in dextrose 5 % 50 mL IVPB     1 g 100 mL/hr over 30 Minutes Intravenous  Once 10/13/16 2025 10/13/16 2136     Subjective: Seen and examined at bedside and states her legs weren't as painful today. No nausea or vomiting. No CP or SOB or dizziness. Had a hard time ambulating to the restroom.  Objective: Vitals:   10/14/16 2024 10/15/16 0603 10/15/16 0635 10/15/16 1455  BP: (!) 144/47  (!) 155/62 (!) 149/44  Pulse: 92   (!) 107  Resp: 18  20 18   Temp: 98.6 F (37 C)  98.2 F (36.8 C) 99.2 F (37.3 C)  TempSrc: Oral  Oral Oral  SpO2: 98% 93% 94% 97%  Weight:      Height:        Intake/Output Summary (Last 24 hours) at 10/15/16 1627 Last data filed at 10/15/16 1308  Gross per 24 hour  Intake          1164.17 ml  Output                0 ml  Net          1164.17 ml   Filed Weights   10/13/16 1822 10/13/16 2343  Weight: (!) 204.1 kg (450 lb) (!) 204.1 kg (449 lb 15.3 oz)   Examination: Physical Exam:  Constitutional: Morbidly obese Female who WN/WD, NAD and appears calm and comfortable Eyes: Lids and conjunctivae normal, sclerae anicteric  ENMT: External Ears, Nose appear normal. Neck: Appears normal, supple, no cervical masses, normal ROM, no appreciable thyromegaly, no JVD Respiratory: Diminished to auscultation bilaterally, no wheezing, rales, rhonchi or crackles. Normal respiratory effort and patient is not tachypenic. No accessory muscle use.  Cardiovascular: RRR, no murmurs / rubs / gallops. S1 and S2 auscultated. Severe Bilateral Lymphedema with less skin erythema today and maceration between skin folds.  Abdomen: Soft, non-tender, distended due to body habitus. No masses palpated. No appreciable hepatosplenomegaly. Bowel sounds positive x4.   GU: Deferred. Musculoskeletal: No  clubbing / cyanosis of digits/nails. No joint deformity upper and lower extremities. ROM limited due to pain Skin: Severe bilateral lymphedema with swelling,  Less warmth, tenderness with skin changes and slight breakdown. Mild weeping appreciated and continued to have foul odor coming from right leg.   Neurologic: CN 2-12 grossly intact with no focal deficits. Romberg sign cerebellar reflexes not assessed.  Psychiatric: Normal judgment and insight. Alert and oriented x 3. Normal mood and appropriate affect.   Data Reviewed: I have personally reviewed following labs and imaging studies  CBC:  Recent Labs Lab 10/13/16 1958 10/14/16 0510 10/15/16 0523  WBC 8.5 8.4 7.8  NEUTROABS 5.4  --  4.0  HGB 11.9* 10.4* 10.0*  HCT 38.5 33.3* 33.1*  MCV 85.0 85.2 83.0  PLT 307 303 150   Basic Metabolic Panel:  Recent Labs Lab 10/13/16 1958 10/14/16 0510 10/14/16 2034 10/15/16 0523  NA 139 138  --  139  K 4.5 3.9  --  4.1  CL 103 104  --  105  CO2 28 26  --  28  GLUCOSE 140* 138*  --  162*  BUN 24* 21*  --  16  CREATININE 1.46* 1.28*  --  1.05*  CALCIUM 9.4 8.9  --  8.6*  MG  --   --  1.6*  --   PHOS  --   --   --  3.3   GFR: Estimated Creatinine Clearance: 90.9 mL/min (A) (by C-G formula based on SCr of 1.05 mg/dL (H)). Liver Function Tests:  Recent Labs Lab 10/15/16 0523  AST 22  ALT 17  ALKPHOS 55  BILITOT 0.7  PROT 5.7*  ALBUMIN 2.7*   No results for input(s): LIPASE, AMYLASE in the last 168 hours. No results for input(s): AMMONIA in the last 168 hours. Coagulation Profile: No results for input(s): INR, PROTIME in the last 168 hours. Cardiac Enzymes: No results for input(s): CKTOTAL, CKMB, CKMBINDEX, TROPONINI in the last 168 hours. BNP (last 3 results) No results for input(s): PROBNP in the last 8760 hours. HbA1C: No results for input(s): HGBA1C in the last 72 hours. CBG:  Recent Labs Lab 10/14/16 1124 10/14/16 1810 10/14/16 2156 10/15/16 0756  10/15/16 1154  GLUCAP 193* 146* 128* 195* 172*   Lipid Profile: No results for input(s): CHOL, HDL, LDLCALC, TRIG, CHOLHDL, LDLDIRECT in the last 72 hours. Thyroid Function Tests: No results for input(s): TSH, T4TOTAL, FREET4, T3FREE, THYROIDAB in the last 72 hours. Anemia Panel: No results for input(s): VITAMINB12, FOLATE, FERRITIN, TIBC, IRON, RETICCTPCT in the last 72 hours. Sepsis Labs:  Recent Labs Lab 10/13/16 2225 10/14/16 0108  PROCALCITON 0.18  --   LATICACIDVEN 1.2 1.1   Recent Results (from the past 240 hour(s))  Culture, blood (routine x 2)     Status: None (Preliminary result)   Collection Time: 10/13/16 10:25 PM  Result Value Ref Range Status   Specimen Description BLOOD RIGHT ANTECUBITAL  Final   Special Requests BOTTLES DRAWN AEROBIC AND ANAEROBIC 5CC  Final   Culture   Final    NO GROWTH 1 DAY Performed at [facility], [place] [address] [place], [place] [address]    Report Status PENDING  Incomplete  Culture, blood (routine x 2)     Status: None (Preliminary result)   Collection Time: 10/14/16 12:18 AM  Result Value Ref Range Status   Specimen Description BLOOD RIGHT ANTECUBITAL  Final   Special Requests BOTTLES DRAWN AEROBIC ONLY 6CC  Final   Culture   Final    NO GROWTH 1 DAY Performed at [facility], [place] [address], [place], [place] [address]    Report Status PENDING  Incomplete     Radiology Studies: No results found.  Scheduled Meds: . amLODipine  10 mg Oral QHS  . cefTRIAXone (ROCEPHIN)  IV  1 g Intravenous Q24H  . dextromethorphan-guaiFENesin  1 tablet Oral BID  . enoxaparin (LOVENOX) injection  100 mg Subcutaneous QHS  . insulin aspart  0-5 Units Subcutaneous QHS  . insulin aspart  0-9 Units Subcutaneous TID WC  . sodium chloride flush  3 mL Intravenous Q12H   Continuous Infusions: . sodium chloride 50 mL/hr at 10/14/16 1400    LOS: 2 days   Kerney Elbe, DO [facility] Pager [phone]  If 7PM-7AM,  please contact night-coverage [email or web address] Password [facility] 10/15/2016, 4:27 PM

## 2016-10-16 LAB — COMPREHENSIVE METABOLIC PANEL
ALT: 17 U/L (ref 14–54)
ANION GAP: 9 (ref 5–15)
AST: 24 U/L (ref 15–41)
Albumin: 3 g/dL — ABNORMAL LOW (ref 3.5–5.0)
Alkaline Phosphatase: 57 U/L (ref 38–126)
BILIRUBIN TOTAL: 0.6 mg/dL (ref 0.3–1.2)
BUN: 12 mg/dL (ref 6–20)
CO2: 25 mmol/L (ref 22–32)
Calcium: 8.7 mg/dL — ABNORMAL LOW (ref 8.9–10.3)
Chloride: 104 mmol/L (ref 101–111)
Creatinine, Ser: 0.98 mg/dL (ref 0.44–1.00)
GFR, EST NON AFRICAN AMERICAN: 57 mL/min — AB (ref >60)
Glucose, Bld: 160 mg/dL — ABNORMAL HIGH (ref 65–99)
POTASSIUM: 3.9 mmol/L (ref 3.5–5.1)
Sodium: 138 mmol/L (ref 135–145)
TOTAL PROTEIN: 6.4 g/dL — AB (ref 6.5–8.1)

## 2016-10-16 LAB — CBC WITH DIFFERENTIAL/PLATELET
Basophils Absolute: 0 10*3/uL (ref 0.0–0.1)
Basophils Relative: 0 %
EOS PCT: 2 %
Eosinophils Absolute: 0.2 10*3/uL (ref 0.0–0.7)
HEMATOCRIT: 34.1 % — AB (ref 36.0–46.0)
Hemoglobin: 10.6 g/dL — ABNORMAL LOW (ref 12.0–15.0)
LYMPHS PCT: 24 %
Lymphs Abs: 2.3 10*3/uL (ref 0.7–4.0)
MCH: 25.5 pg — ABNORMAL LOW (ref 26.0–34.0)
MCHC: 31.1 g/dL (ref 30.0–36.0)
MCV: 82 fL (ref 78.0–100.0)
MONO ABS: 1.3 10*3/uL — AB (ref 0.1–1.0)
MONOS PCT: 14 %
NEUTROS ABS: 5.7 10*3/uL (ref 1.7–7.7)
Neutrophils Relative %: 60 %
PLATELETS: 300 10*3/uL (ref 150–400)
RBC: 4.16 MIL/uL (ref 3.87–5.11)
RDW: 13.7 % (ref 11.5–15.5)
WBC: 9.5 10*3/uL (ref 4.0–10.5)

## 2016-10-16 LAB — GLUCOSE, CAPILLARY
GLUCOSE-CAPILLARY: 159 mg/dL — AB (ref 65–99)
GLUCOSE-CAPILLARY: 174 mg/dL — AB (ref 65–99)
Glucose-Capillary: 153 mg/dL — ABNORMAL HIGH (ref 65–99)
Glucose-Capillary: 140 mg/dL — ABNORMAL HIGH (ref 65–99)

## 2016-10-16 LAB — MAGNESIUM: MAGNESIUM: 1.7 mg/dL (ref 1.7–2.4)

## 2016-10-16 LAB — PHOSPHORUS: PHOSPHORUS: 2.8 mg/dL (ref 2.5–4.6)

## 2016-10-16 MED ORDER — DEXTROSE 5 % IV SOLN
2.0000 g | INTRAVENOUS | Status: DC
  Administered 2016-10-16 – 2016-10-19 (×4): 2 g via INTRAVENOUS
  Filled 2016-10-16 (×5): qty 2

## 2016-10-16 MED ORDER — GUAIFENESIN 100 MG/5ML PO SOLN
5.0000 mL | ORAL | Status: DC | PRN
  Administered 2016-10-16 – 2016-10-19 (×3): 100 mg via ORAL
  Filled 2016-10-16 (×3): qty 10

## 2016-10-16 MED ORDER — SODIUM CHLORIDE 3 % IN NEBU
4.0000 mL | INHALATION_SOLUTION | Freq: Every day | RESPIRATORY_TRACT | Status: AC
  Administered 2016-10-16 – 2016-10-18 (×3): 4 mL via RESPIRATORY_TRACT
  Filled 2016-10-16 (×3): qty 4

## 2016-10-16 NOTE — Progress Notes (Signed)
PROGRESS NOTE    Jodi Underwood  DEY:[id number] DOB: Sep 13, 1945 DOA: 10/13/2016 PCP: Lindie Spruce, MD  Brief Narrative:  Jodi Underwood is a 71 y.o. female with medical history significant of morbid obesity, lymphedema, hypertension, diabetes mellitus, asthma, anemia, neuropathy, urinary incontinence, who presents with leg pain. Patient states that she has bilateral lower leg pain since Wednesday. The pain is constantly, 8/10, worsening on the right lower leg. Pt states that her daughter noticed several wounds over her lower legs on Wednesday. She was seen by PCP on Thursday, but it did not give her prescription of antibiotics. He PCP recommended local wound care with a home nurse, but her wound care nurse noticed "black skin changes" and called EMS. Patient also states that her bilateral leg edema has been worsening and her leg has been weeping more recently form their baseline. Patient denies any fever, chills, chest pain, shortness breath, nausea, vomiting, diarrhea, abdominal pain, symptoms of UTI. No unilateral weakness. Was admitted for a suspected Cellulitis in the setting of severe chronic lymphedema. States she felt a little better today but had some nasal drainage.   Assessment & Plan:   Principal Problem:   Cellulitis of leg, bilateral Active Problems:   DM type 2, uncontrolled, with neuropathy ([place])   Morbid obesity ([place])   Essential hypertension   Asthma   Lymphedema   AKI (acute kidney injury) ([place])  Bilateral leg cellulitis (worse on the right leg) in the setting of severe chronic Lymphedema: -Pt is not septic. Lactic acid is normal at 1.1. Hemodynamically stable -C/w Empiric antimicrobial treatment with IV Ceftriaxone and consider changing if not improving -PRN Oxycodone-Acetaminophen 2 tablets po q6hprn for Severe Pain -Blood cultures x 2 done and showed NGTD x 2 days -ESR is elevated at 45 and CRP is slightly high at 1.2 -Wound care consult Appreciated ->  Recommended that Nursing to cleanse and moisturize her buttocks and LEs, also to float the heels so that we do not get a pressure injury in house. Needs legs wrapped from toe to Hip and need to follow up with [facility] or [place] for Lymphedema management.  -Procalcition was 0.18 -LE Venous Doppler was unable to visualize most lower extremity veins due to patient body habitus and pain tolerance, visualized portions appear negative for thrombosis -Will culture leg drainage -Will Consider obtaining Imaging of Leg to r/o Osteomyelitis but improving  DM-II complicated by Neuropathy  -Last A1c 6.6 on 04/30/16, well controled.  -Patient is taking Novolin N 50 units bid per day at home -Will start patient on Sensitive Novolog SSI AC HS -CBG's ranging from 150-195  AKI:  -Likely due to prerenal secondary to dehydration and Continuation of ARB, diruetics, NSAIDs -BUN/Cr went from 24/1.46 -> 12/0.98 -Hold and Avoid Nephrotoxic Medications including Lasix 40 mg daily, Naproxen 440 mg po BID and Valsartan 80- mg po Daily -Repeat CMP in AM  HTN: -Hold Valsartan and Lasix due to AKI -C/w Amlodipine 10 mg po Daily qHS -IV Hydralazine 5 mg IV q2hprn for SBP >175  Asthma:  -Stable -C/w prn Albuterol Nebs 2.5 mg q6hprn for Wheezing or SOB.  Morbid Obesity -Patient's BMI was 72.8 -Weight Loss Counseling given -Nutritionist Consultation Appreciated  Normocytic Anemia -Patient's Hb/Hct went from 11.9/38.5 -> 10.4/33.3 -> 10.0/33.1 -> 10.6/34.1 -Continue to Monitor CBC's  Hypomagnesemia -Mag Level was 1.7 -Repeat Mag Level in AM  DVT prophylaxis: Lovenox 100 mg sq q24h Code Status: FULL CODE Family Communication: No family present at bedside Disposition  Plan: Remain Inpatient; Will get PT evaluation  Consultants:   None   Procedures: Bilateral Lower Extremity Venous Duplex Preliminary findings: VERY LIMITED exam, non diagnostic. Unable to visualize most  lower extremity veins due to patient body habitus and pain tolerance, visualized portions appear negative for thrombosis.   Antimicrobials:  Anti-infectives    Start     Dose/Rate Route Frequency Ordered Stop   10/16/16 2000  cefTRIAXone (ROCEPHIN) 2 g in dextrose 5 % 50 mL IVPB     2 g 100 mL/hr over 30 Minutes Intravenous Every 24 hours 10/16/16 1133     10/14/16 2000  cefTRIAXone (ROCEPHIN) 1 g in dextrose 5 % 50 mL IVPB  Status:  Discontinued     1 g 100 mL/hr over 30 Minutes Intravenous Every 24 hours 10/13/16 2156 10/16/16 1133   10/13/16 2030  cefTRIAXone (ROCEPHIN) 1 g in dextrose 5 % 50 mL IVPB     1 g 100 mL/hr over 30 Minutes Intravenous  Once 10/13/16 2025 10/13/16 2136     Subjective: Seen and examined at bedside and states she thinks her legs feel better and are not as sensitive to touch. Stated she had some nasal drainage earlier. No other concerns or complaints.   Objective: Vitals:   10/15/16 2105 10/16/16 0651 10/16/16 1306 10/16/16 1500  BP: (!) 146/70 (!) 142/57  (!) 155/62  Pulse: 93 (!) 112 95   Resp: _0 Temp: 98.3 F (36.8 C) 97.9 F (36.6 C)  97.7 F (36.5 C)  TempSrc: Oral Oral  Axillary  SpO2: 98% 97% 93% 94%  Weight:      Height:        Intake/Output Summary (Last 24 hours) at 10/16/16 2145 Last data filed at 10/16/16 1000  Gross per 24 hour  Intake               50 ml  Output             1000 ml  Net             -950 ml   Filed Weights   10/13/16 1822 10/13/16 2343  Weight: (!) 204.1 kg (450 lb) (!) 204.1 kg (449 lb 15.3 oz)   Examination: Physical Exam:  Constitutional: Morbidly obese Female who WN/WD, NAD and appears calm and comfortable Eyes: Lids and conjunctivae normal, sclerae anicteric  ENMT: External Ears, Nose appear normal. Neck: Appears normal, supple, no cervical masses, normal ROM, no appreciable thyromegaly, no JVD Respiratory: Diminished to auscultation bilaterally, no wheezing, rales, rhonchi or crackles. Normal  respiratory effort and patient is not tachypenic. No accessory muscle use.  Cardiovascular: RRR, no murmurs / rubs / gallops. S1 and S2 auscultated. Severe Bilateral Lymphedema with less skin erythema today and less maceration between skin folds.  Abdomen: Soft, non-tender, distended due to body habitus. No masses palpated. No appreciable hepatosplenomegaly. Bowel sounds positive x4.   GU: Deferred. Musculoskeletal: No clubbing / cyanosis of digits/nails. No joint deformity upper and lower extremities. ROM limited due to pain Skin: Severe bilateral lymphedema with swelling,  Less warmth, less tenderness with skin changes and slight breakdown. No weeping appreciated today and has less of a foul odor coming from right leg.   Neurologic: CN 2-12 grossly intact with no focal deficits. Romberg sign cerebellar reflexes not assessed.  Psychiatric: Normal judgment and insight. Alert and oriented x 3. Normal mood and appropriate affect.   Data Reviewed: I have personally reviewed following labs and imaging studies  CBC:  Recent Labs Lab 10/13/16 1958 10/14/16 0510 10/15/16 0523 10/16/16 0440  WBC 8.5 8.4 7.8 9.5  NEUTROABS 5.4  --  4.0 5.7  HGB 11.9* 10.4* 10.0* 10.6*  HCT 38.5 33.3* 33.1* 34.1*  MCV 85.0 85.2 83.0 82.0  PLT 307 303 281 073   Basic Metabolic Panel:  Recent Labs Lab 10/13/16 1958 10/14/16 0510 10/14/16 2034 10/15/16 0523 10/16/16 0440  NA 139 138  --  139 138  K 4.5 3.9  --  4.1 3.9  CL 103 104  --  105 104  CO2 28 26  --  28 25  GLUCOSE 140* 138*  --  162* 160*  BUN 24* 21*  --  16 12  CREATININE 1.46* 1.28*  --  1.05* 0.98  CALCIUM 9.4 8.9  --  8.6* 8.7*  MG  --   --  1.6*  --  1.7  PHOS  --   --   --  3.3 2.8   GFR: Estimated Creatinine Clearance: 97.4 mL/min (by C-G formula based on SCr of 0.98 mg/dL). Liver Function Tests:  Recent Labs Lab 10/15/16 0523 10/16/16 0440  AST 22 24  ALT 17 17  ALKPHOS 55 57  BILITOT 0.7 0.6  PROT 5.7* 6.4*  ALBUMIN  2.7* 3.0*   No results for input(s): LIPASE, AMYLASE in the last 168 hours. No results for input(s): AMMONIA in the last 168 hours. Coagulation Profile: No results for input(s): INR, PROTIME in the last 168 hours. Cardiac Enzymes: No results for input(s): CKTOTAL, CKMB, CKMBINDEX, TROPONINI in the last 168 hours. BNP (last 3 results) No results for input(s): PROBNP in the last 8760 hours. HbA1C: No results for input(s): HGBA1C in the last 72 hours. CBG:  Recent Labs Lab 10/15/16 1722 10/15/16 2206 10/16/16 0804 10/16/16 1245 10/16/16 1708  GLUCAP 150* 165* 159* 174* 153*   Lipid Profile: No results for input(s): CHOL, HDL, LDLCALC, TRIG, CHOLHDL, LDLDIRECT in the last 72 hours. Thyroid Function Tests: No results for input(s): TSH, T4TOTAL, FREET4, T3FREE, THYROIDAB in the last 72 hours. Anemia Panel: No results for input(s): VITAMINB12, FOLATE, FERRITIN, TIBC, IRON, RETICCTPCT in the last 72 hours. Sepsis Labs:  Recent Labs Lab 10/13/16 2225 10/14/16 0108  PROCALCITON 0.18  --   LATICACIDVEN 1.2 1.1   Recent Results (from the past 240 hour(s))  Culture, blood (routine x 2)     Status: None (Preliminary result)   Collection Time: 10/13/16 10:25 PM  Result Value Ref Range Status   Specimen Description BLOOD RIGHT ANTECUBITAL  Final   Special Requests BOTTLES DRAWN AEROBIC AND ANAEROBIC 5CC  Final   Culture   Final    NO GROWTH 2 DAYS Performed at [facility], [place] [address], [place], [place] [address]    Report Status PENDING  Incomplete  Culture, blood (routine x 2)     Status: None (Preliminary result)   Collection Time: 10/14/16 12:18 AM  Result Value Ref Range Status   Specimen Description BLOOD RIGHT ANTECUBITAL  Final   Special Requests BOTTLES DRAWN AEROBIC ONLY 6CC  Final   Culture   Final    NO GROWTH 2 DAYS Performed at [facility], [place] [address], [place], [place] [address]    Report Status PENDING  Incomplete     Radiology  Studies: No results found.  Scheduled Meds: . amLODipine  10 mg Oral QHS  . cefTRIAXone (ROCEPHIN)  IV  2 g Intravenous Q24H  . dextromethorphan-guaiFENesin  1  tablet Oral BID  . enoxaparin (LOVENOX) injection  100 mg Subcutaneous QHS  . insulin aspart  0-5 Units Subcutaneous QHS  . insulin aspart  0-9 Units Subcutaneous TID WC  . sodium chloride flush  3 mL Intravenous Q12H  . sodium chloride HYPERTONIC  4 mL Nebulization Daily   Continuous Infusions:   LOS: 3 days   Kerney Elbe, DO [facility] Pager [phone]  If 7PM-7AM, please contact night-coverage [email or web address] Password [facility] 10/16/2016, 9:45 PM

## 2016-10-16 NOTE — Consult Note (Signed)
[place] Nurse wound consult note  Reason for Consult:Bialteral lymphedema (chronic), no wounds.  Currently has cellulitis and resulting tenderness to right heel and left plantar foot. Discoloration noted, tender to touch.  Patient was seen at the outpatient wound care center in [facility]; they assisted her to obtain lymphedema pumps very recently and discharged her.  She used the pumps only one time at home,  And that her daughter is unable to wrap her. She has not been using the pumps.  States that she has had [facility] in the past that have helped manage wraps and lymphedema pumps/  Wound type:No open wounds- cellulitic erythema to right lateral foot and left plantar foot.  Pressure Ulcer POA: No Measurement:Left foot with 4 cm x 2 cm dark discoloration noted, tender to touch.  Wound bed:N/A Drainage (amount, consistency, odor) None Periwound: N/A Dressing procedure/placement/frequency: We do not provide compression wrapping in acute care, her LEs would need to be wrapped from toe to hip.  She will have less edema here since she will be in bed. I have asked Nursing to cleanse and moisturize her buttocks and LEs, also to float the heels so that we do not get a pressure injury in house. Patient should follow up with either the [facility] or [facility] services for ongoing lymphedema management.  WIll discuss with case management team.   [place] nursing team will not follow, but will remain available to this patient, the nursing and medical teams.  Please re-consult if needed. Will not follow at this time.  Please re-consult if needed.  Domenic Moras RN BSN [place] Pager [phone]

## 2016-10-17 LAB — PHOSPHORUS: Phosphorus: 3.2 mg/dL (ref 2.5–4.6)

## 2016-10-17 LAB — CBC WITH DIFFERENTIAL/PLATELET
BASOS PCT: 1 %
Basophils Absolute: 0.1 10*3/uL (ref 0.0–0.1)
Eosinophils Absolute: 0.3 10*3/uL (ref 0.0–0.7)
Eosinophils Relative: 3 %
HEMATOCRIT: 35.8 % — AB (ref 36.0–46.0)
Hemoglobin: 11.3 g/dL — ABNORMAL LOW (ref 12.0–15.0)
Lymphocytes Relative: 20 %
Lymphs Abs: 1.8 10*3/uL (ref 0.7–4.0)
MCH: 25.7 pg — ABNORMAL LOW (ref 26.0–34.0)
MCHC: 31.6 g/dL (ref 30.0–36.0)
MCV: 81.5 fL (ref 78.0–100.0)
MONO ABS: 1.3 10*3/uL — AB (ref 0.1–1.0)
MONOS PCT: 15 %
NEUTROS ABS: 5.6 10*3/uL (ref 1.7–7.7)
Neutrophils Relative %: 61 %
PLATELETS: 300 10*3/uL (ref 150–400)
RBC: 4.39 MIL/uL (ref 3.87–5.11)
RDW: 13.4 % (ref 11.5–15.5)
WBC: 9.1 10*3/uL (ref 4.0–10.5)

## 2016-10-17 LAB — COMPREHENSIVE METABOLIC PANEL
ALBUMIN: 3 g/dL — AB (ref 3.5–5.0)
ALT: 20 U/L (ref 14–54)
AST: 23 U/L (ref 15–41)
Alkaline Phosphatase: 63 U/L (ref 38–126)
Anion gap: 9 (ref 5–15)
BUN: 9 mg/dL (ref 6–20)
CHLORIDE: 100 mmol/L — AB (ref 101–111)
CO2: 27 mmol/L (ref 22–32)
CREATININE: 0.91 mg/dL (ref 0.44–1.00)
Calcium: 8.8 mg/dL — ABNORMAL LOW (ref 8.9–10.3)
GFR calc Af Amer: >60 mL/min (ref >60)
GLUCOSE: 175 mg/dL — AB (ref 65–99)
POTASSIUM: 3.8 mmol/L (ref 3.5–5.1)
Sodium: 136 mmol/L (ref 135–145)
Total Bilirubin: 0.5 mg/dL (ref 0.3–1.2)
Total Protein: 6.5 g/dL (ref 6.5–8.1)

## 2016-10-17 LAB — GLUCOSE, CAPILLARY
GLUCOSE-CAPILLARY: 201 mg/dL — AB (ref 65–99)
GLUCOSE-CAPILLARY: 145 mg/dL — AB (ref 65–99)
Glucose-Capillary: 167 mg/dL — ABNORMAL HIGH (ref 65–99)
Glucose-Capillary: 188 mg/dL — ABNORMAL HIGH (ref 65–99)

## 2016-10-17 LAB — MAGNESIUM: MAGNESIUM: 1.5 mg/dL — AB (ref 1.7–2.4)

## 2016-10-17 MED ORDER — MAGNESIUM SULFATE 2 GM/50ML IV SOLN
2.0000 g | Freq: Once | INTRAVENOUS | Status: AC
  Administered 2016-10-17: 2 g via INTRAVENOUS
  Filled 2016-10-17: qty 50

## 2016-10-17 NOTE — Evaluation (Signed)
Physical Therapy Evaluation Patient Details Name: Jodi Underwood MRN: [id number] DOB: [birth year] Today's Date: 10/17/2016   History of Present Illness  Jodi Underwood is a 71 y.o. female with medical history significant of morbid obesity, lymphedema, hypertension, diabetes mellitus, asthma, anemia, neuropathy, urinary incontinence, who presents with leg pain on 3/30.  Clinical Impression  The patient is requiring greater than 3 person assist for mobility  To roll  And sit up for bed to bed changed. The patient is complaining iof severe leg pain and  Felt unable to attempt standing, although this writer deems attempts to stand from the bariatric bed is risky for patient to not be able to sit back down due to height and air mattress surface. Pt admitted with above diagnosis. Pt currently with functional limitations due to the deficits listed below (see PT Problem List).  Pt will benefit from skilled PT to increase their independence and safety with mobility to allow discharge to the venue listed below.        Follow Up Recommendations SNF;Supervision/Assistance - 24 hour    Equipment Recommendations   (patient requests a new power chair.)    Recommendations for Other Services       Precautions / Restrictions Precautions Precaution Comments: painful both legs, weeping right.      Mobility  Bed Mobility Overal bed mobility: Needs Assistance Bed Mobility: Sit to Supine;Supine to Sit;Rolling     Supine to sit: Total assist;+2 for physical assistance;+2 for safety/equipment Sit to supine: Total assist;+2 for physical assistance;+2 for safety/equipment   General bed mobility comments: assist with legs and trunk for rolling for bed to be changed with soaked from urine. only made it to partial turn and sit at the edge, assist with legs back onto bed..   Transfers                 General transfer comment: unable to attempt from bed, air mattress and patient reports that pain  is too great.   Ambulation/Gait                Stairs            Wheelchair Mobility    Modified Rankin (Stroke Patients Only)       Balance                                             Pertinent Vitals/Pain Pain Assessment: 0-10 Pain Score: 10-Worst pain ever Pain Location: both legs when moved Pain Descriptors / Indicators: Burning;Cramping;Crushing Pain Intervention(s): Limited activity within patient's tolerance;Monitored during session;Repositioned    Home Living Family/patient expects to be discharged to:: Private residence Living Arrangements: Alone Available Help at Discharge: Family;Available PRN/intermittently Type of Home: House Home Access: Ramped entrance     Home Layout: One level Home Equipment: Bedside commode;Wheelchair - power;Walker - 2 wheels;Walker - 4 wheels Additional Comments: has been using 3 in 1, daughters can at times get groceries, has some groceries delivered, . Patient states her WC is too small, shecannot et into hospital bed and the recliners are not adequate.    Prior Function Level of Independence: Needs assistance   Gait / Transfers Assistance Needed: states that she uses a Rw to transfer to [facility]           Hand Dominance        Extremity/Trunk Assessment   Upper Extremity  Assessment Upper Extremity Assessment: Overall WFL for tasks assessed    Lower Extremity Assessment Lower Extremity Assessment: RLE deficits/detail;LLE deficits/detail RLE Deficits / Details: needs assist to move leg to bed edge and back onto bed. LLE Deficits / Details: same as right but more painful       Communication      Cognition Arousal/Alertness: Awake/alert Behavior During Therapy: WFL for tasks assessed/performed;Anxious Overall Cognitive Status: Within Functional Limits for tasks assessed                                        General Comments      Exercises     Assessment/Plan     PT Assessment Patient needs continued PT services  PT Problem List Decreased activity tolerance;Decreased mobility;Obesity;Decreased skin integrity;Decreased knowledge of use of DME       PT Treatment Interventions DME instruction;Functional mobility training;Therapeutic activities;Patient/family education    PT Goals (Current goals can be found in the Care Plan section)  Acute Rehab PT Goals Patient Stated Goal: to return home. PT Goal Formulation: With patient Time For Goal Achievement: 10/31/16 Potential to Achieve Goals: Fair    Frequency Min 3X/week   Barriers to discharge Decreased caregiver support      Co-evaluation               End of Session   Activity Tolerance: Patient limited by pain Patient left: in bed;with call bell/phone within reach;with nursing/sitter in room Nurse Communication: Mobility status PT Visit Diagnosis: Difficulty in walking, not elsewhere classified (R26.2)    Time: 1103-1140 PT Time Calculation (min) (ACUTE ONLY): 37 min   Charges:   PT Evaluation $PT Eval High Complexity: 1 Procedure PT Treatments $Therapeutic Activity: 8-22 mins   PT G CodesTresa Endo PT [phone]   Claretha Cooper 10/17/2016, 1:25 PM

## 2016-10-17 NOTE — Progress Notes (Signed)
PROGRESS NOTE    Jodi Underwood  BXU:[id number] DOB: [birth year] DOA: 10/13/2016 PCP: Lindie Spruce, MD  Brief Narrative:  Jodi Underwood is a 71 y.o. female with medical history significant of morbid obesity, lymphedema, hypertension, diabetes mellitus, asthma, anemia, neuropathy, urinary incontinence, who presents with leg pain. Patient states that she has bilateral lower leg pain since Wednesday. The pain is constantly, 8/10, worsening on the right lower leg. Pt states that her daughter noticed several wounds over her lower legs on Wednesday. She was seen by PCP on Thursday, but it did not give her prescription of antibiotics. He PCP recommended local wound care with a home nurse, but her wound care nurse noticed "black skin changes" and called EMS. Patient also states that her bilateral leg edema has been worsening and her leg has been weeping more recently form their baseline. Patient denies any fever, chills, chest pain, shortness breath, nausea, vomiting, diarrhea, abdominal pain, symptoms of UTI. No unilateral weakness. Was admitted for a suspected Cellulitis in the setting of severe chronic lymphedema. States she felt a little nauseous today and vomited once. Thinks she is feeling better. PT Evaluated and patient needs SNF so have consulted Social work for bed placement.   Assessment & Plan:   Principal Problem:   Cellulitis of leg, bilateral Active Problems:   DM type 2, uncontrolled, with neuropathy ([place])   Morbid obesity ([place])   Essential hypertension   Asthma   Lymphedema   AKI (acute kidney injury) ([place])  Bilateral leg cellulitis (worse on the right leg) in the setting of severe chronic Lymphedema, slowly improving -Pt is not septic. Lactic acid is normal at 1.1. Hemodynamically stable -C/w Empiric antimicrobial treatment with IV Ceftriaxone and consider changing if not improving -PRN Oxycodone-Acetaminophen 2 tablets po q6hprn for Severe Pain -Blood cultures x 2 done  and showed NGTD x 3 days -ESR is elevated at 45 and CRP is slightly high at 1.2 -Wound care consult Appreciated -> Recommended that Nursing to cleanse and moisturize her buttocks and LEs, also to float the heels so that we do not get a pressure injury in house. Needs legs wrapped from toe to Hip and need to follow up with [facility] or [facility] for Lymphedema management.  -Procalcition was 0.18 -LE Venous Doppler was unable to visualize most lower extremity veins due to patient body habitus and pain tolerance, visualized portions appear negative for thrombosis -Will culture leg drainage -Will Consider obtaining Imaging of Leg to r/o Osteomyelitis but improving -PT consulted and recommend SNF as she is requiring greater than 3 person for assist for mobility -Corporate treasurer for SNF placement.   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  -Patient is taking Novolin N 50 units bid per day at home -C/w Patient on Sensitive Novolog SSI AC HS -CBG's ranging from 140-188  AKI, improved -Likely due to prerenal secondary to dehydration and Continuation of ARB, diruetics, NSAIDs -BUN/Cr went from 24/1.46 -> 9/0.91 -Hold and Avoid Nephrotoxic Medications including Lasix 40 mg daily, Naproxen 440 mg po BID and Valsartan 80- mg po Daily -Repeat CMP in AM  HTN: -Held Valsartan and Lasix due to AKI; Will likely restart Valsartan in AM -C/w Amlodipine 10 mg po Daily qHS -IV Hydralazine 5 mg IV q2hprn for SBP >175  Asthma:  -Stable -C/w prn Albuterol Nebs 2.5 mg q6hprn for Wheezing or SOB. -Added Hypertonic Saline Nebs  Morbid Obesity -Patient's BMI was 72.8 -Weight Loss Counseling given -  Nutritionist Consultation Appreciated  Normocytic Anemia -Patient's Hb/Hct went from 11.9/38.5 -> 10.4/33.3 -> 10.0/33.1 -> 10.6/34.1 -> 11.3/35.8 -Continue to Monitor CBC's  Hypomagnesemia -Mag Level was 1.5 -Replete with IV Mag Sulfate  2 grams -Repeat Mag Level in AM  DVT prophylaxis: Lovenox 100 mg sq q24h Code Status: FULL CODE Family Communication: No family present at bedside Disposition Plan: Remain Inpatient; Will get PT evaluation  Consultants:   None   Procedures: Bilateral Lower Extremity Venous Duplex Preliminary findings: VERY LIMITED exam, non diagnostic. Unable to visualize most lower extremity veins due to patient body habitus and pain tolerance, visualized portions appear negative for thrombosis.   Antimicrobials:  Anti-infectives    Start     Dose/Rate Route Frequency Ordered Stop   10/16/16 2000  cefTRIAXone (ROCEPHIN) 2 g in dextrose 5 % 50 mL IVPB     2 g 100 mL/hr over 30 Minutes Intravenous Every 24 hours 10/16/16 1133     10/14/16 2000  cefTRIAXone (ROCEPHIN) 1 g in dextrose 5 % 50 mL IVPB  Status:  Discontinued     1 g 100 mL/hr over 30 Minutes Intravenous Every 24 hours 10/13/16 2156 10/16/16 1133   10/13/16 2030  cefTRIAXone (ROCEPHIN) 1 g in dextrose 5 % 50 mL IVPB     1 g 100 mL/hr over 30 Minutes Intravenous  Once 10/13/16 2025 10/13/16 2136     Subjective: Seen and examined at bedside and states she had a rough morning but was doing better. Legs are slightly sensitive but improved. No CP or SOB. States she is coughing slightly.    Objective: Vitals:   10/17/16 0453 10/17/16 0657 10/17/16 1015 10/17/16 1542  BP:  (!) 150/78  (!) 164/66  Pulse:  98  (!) 134  Resp:  18  20  Temp:  97.8 F (36.6 C)  98.2 F (36.8 C)  TempSrc:  Oral  Oral  SpO2: 90% 96% 93% 94%  Weight:      Height:        Intake/Output Summary (Last 24 hours) at 10/17/16 1551 Last data filed at 10/16/16 2200  Gross per 24 hour  Intake               50 ml  Output                0 ml  Net               50 ml   Filed Weights   10/13/16 1822 10/13/16 2343  Weight: (!) 204.1 kg (450 lb) (!) 204.1 kg (449 lb 15.3 oz)   Examination: Physical Exam:  Constitutional: Morbidly obese Female who WN/WD, NAD  and appears calm and comfortable Eyes: Lids and conjunctivae normal, sclerae anicteric  ENMT: External Ears, Nose appear normal. Neck: Appears normal, supple, no cervical masses, normal ROM, no appreciable thyromegaly, no JVD Respiratory: Diminished to auscultation bilaterally, no wheezing, rales, rhonchi or crackles. Normal respiratory effort and patient is not tachypenic. No accessory muscle use.  Cardiovascular: RRR, no murmurs / rubs / gallops. S1 and S2 auscultated. Severe Bilateral Lymphedema with less skin erythema today and less maceration between skin folds.  Abdomen: Soft, non-tender, distended due to body habitus. No masses palpated. No appreciable hepatosplenomegaly. Bowel sounds positive x4.   GU: Deferred. Musculoskeletal: No clubbing / cyanosis of digits/nails. No joint deformity upper and lower extremities. ROM limited due to pain Skin: Severe bilateral lymphedema with some swelling. Leg Erythema and warmth has decreased and there is  less tenderness with skin changes and slight breakdown. No weeping appreciated today. Not as odorous. Neurologic: CN 2-12 grossly intact with no focal deficits. Romberg sign cerebellar reflexes not assessed.  Psychiatric: Normal judgment and insight. Alert and oriented x 3. Normal mood and appropriate affect.   Data Reviewed: I have personally reviewed following labs and imaging studies  CBC:  Recent Labs Lab 10/13/16 1958 10/14/16 0510 10/15/16 0523 10/16/16 0440 10/17/16 0853  WBC 8.5 8.4 7.8 9.5 9.1  NEUTROABS 5.4  --  4.0 5.7 5.6  HGB 11.9* 10.4* 10.0* 10.6* 11.3*  HCT 38.5 33.3* 33.1* 34.1* 35.8*  MCV 85.0 85.2 83.0 82.0 81.5  PLT 307 303 281 300 750   Basic Metabolic Panel:  Recent Labs Lab 10/13/16 1958 10/14/16 0510 10/14/16 2034 10/15/16 0523 10/16/16 0440 10/17/16 0853  NA 139 138  --  139 138 136  K 4.5 3.9  --  4.1 3.9 3.8  CL 103 104  --  105 104 100*  CO2 28 26  --  _0 GLUCOSE 140* 138*  --  162* 160* 175*   BUN 24* 21*  --  _1 CREATININE 1.46* 1.28*  --  1.05* 0.98 0.91  CALCIUM 9.4 8.9  --  8.6* 8.7* 8.8*  MG  --   --  1.6*  --  1.7 1.5*  PHOS  --   --   --  3.3 2.8 3.2   GFR: Estimated Creatinine Clearance: 104.9 mL/min (by C-G formula based on SCr of 0.91 mg/dL). Liver Function Tests:  Recent Labs Lab 10/15/16 0523 10/16/16 0440 10/17/16 0853  AST _2 ALT _3 ALKPHOS 55 57 63  BILITOT 0.7 0.6 0.5  PROT 5.7* 6.4* 6.5  ALBUMIN 2.7* 3.0* 3.0*   No results for input(s): LIPASE, AMYLASE in the last 168 hours. No results for input(s): AMMONIA in the last 168 hours. Coagulation Profile: No results for input(s): INR, PROTIME in the last 168 hours. Cardiac Enzymes: No results for input(s): CKTOTAL, CKMB, CKMBINDEX, TROPONINI in the last 168 hours. BNP (last 3 results) No results for input(s): PROBNP in the last 8760 hours. HbA1C: No results for input(s): HGBA1C in the last 72 hours. CBG:  Recent Labs Lab 10/16/16 1245 10/16/16 1708 10/16/16 2219 10/17/16 0808 10/17/16 1153  GLUCAP 174* 153* 140* 167* 188*   Lipid Profile: No results for input(s): CHOL, HDL, LDLCALC, TRIG, CHOLHDL, LDLDIRECT in the last 72 hours. Thyroid Function Tests: No results for input(s): TSH, T4TOTAL, FREET4, T3FREE, THYROIDAB in the last 72 hours. Anemia Panel: No results for input(s): VITAMINB12, FOLATE, FERRITIN, TIBC, IRON, RETICCTPCT in the last 72 hours. Sepsis Labs:  Recent Labs Lab 10/13/16 2225 10/14/16 0108  PROCALCITON 0.18  --   LATICACIDVEN 1.2 1.1   Recent Results (from the past 240 hour(s))  Culture, blood (routine x 2)     Status: None (Preliminary result)   Collection Time: 10/13/16 10:25 PM  Result Value Ref Range Status   Specimen Description BLOOD RIGHT ANTECUBITAL  Final   Special Requests BOTTLES DRAWN AEROBIC AND ANAEROBIC 5CC  Final   Culture   Final    NO GROWTH 3 DAYS Performed at [facility], [place] [address] [place],   [address]    Report Status PENDING  Incomplete  Culture, blood (routine x 2)     Status: None (Preliminary result)   Collection Time: 10/14/16 12:18 AM  Result Value Ref Range Status  Specimen Description BLOOD RIGHT ANTECUBITAL  Final   Special Requests BOTTLES DRAWN AEROBIC ONLY 6CC  Final   Culture   Final    NO GROWTH 3 DAYS Performed at [facility], [place] [address], [place], [place] [address]    Report Status PENDING  Incomplete    Radiology Studies: No results found.  Scheduled Meds: . amLODipine  10 mg Oral QHS  . cefTRIAXone (ROCEPHIN)  IV  2 g Intravenous Q24H  . dextromethorphan-guaiFENesin  1 tablet Oral BID  . enoxaparin (LOVENOX) injection  100 mg Subcutaneous QHS  . insulin aspart  0-5 Units Subcutaneous QHS  . insulin aspart  0-9 Units Subcutaneous TID WC  . sodium chloride flush  3 mL Intravenous Q12H  . sodium chloride HYPERTONIC  4 mL Nebulization Daily   Continuous Infusions:   LOS: 4 days   Kerney Elbe, DO [facility] Pager [phone]  If 7PM-7AM, please contact night-coverage [email or web address] Password [email or web address] 10/17/2016, 3:51 PM

## 2016-10-18 LAB — PHOSPHORUS: Phosphorus: 3.5 mg/dL (ref 2.5–4.6)

## 2016-10-18 LAB — COMPREHENSIVE METABOLIC PANEL
ALBUMIN: 3 g/dL — AB (ref 3.5–5.0)
ALT: 20 U/L (ref 14–54)
AST: 29 U/L (ref 15–41)
Alkaline Phosphatase: 62 U/L (ref 38–126)
Anion gap: 11 (ref 5–15)
BUN: 10 mg/dL (ref 6–20)
CHLORIDE: 98 mmol/L — AB (ref 101–111)
CO2: 28 mmol/L (ref 22–32)
CREATININE: 1.2 mg/dL — AB (ref 0.44–1.00)
Calcium: 8.6 mg/dL — ABNORMAL LOW (ref 8.9–10.3)
GFR calc Af Amer: 51 mL/min — ABNORMAL LOW (ref >60)
GFR, EST NON AFRICAN AMERICAN: 44 mL/min — AB (ref >60)
Glucose, Bld: 230 mg/dL — ABNORMAL HIGH (ref 65–99)
POTASSIUM: 3.7 mmol/L (ref 3.5–5.1)
SODIUM: 137 mmol/L (ref 135–145)
Total Bilirubin: 0.6 mg/dL (ref 0.3–1.2)
Total Protein: 6.4 g/dL — ABNORMAL LOW (ref 6.5–8.1)

## 2016-10-18 LAB — MAGNESIUM: MAGNESIUM: 1.6 mg/dL — AB (ref 1.7–2.4)

## 2016-10-18 LAB — CBC WITH DIFFERENTIAL/PLATELET
BASOS ABS: 0.1 10*3/uL (ref 0.0–0.1)
BASOS PCT: 1 %
EOS ABS: 0.4 10*3/uL (ref 0.0–0.7)
EOS PCT: 4 %
HCT: 37 % (ref 36.0–46.0)
Hemoglobin: 11.4 g/dL — ABNORMAL LOW (ref 12.0–15.0)
LYMPHS PCT: 14 %
Lymphs Abs: 1.3 10*3/uL (ref 0.7–4.0)
MCH: 26 pg (ref 26.0–34.0)
MCHC: 30.8 g/dL (ref 30.0–36.0)
MCV: 84.5 fL (ref 78.0–100.0)
MONO ABS: 1.3 10*3/uL — AB (ref 0.1–1.0)
Monocytes Relative: 15 %
Neutro Abs: 6 10*3/uL (ref 1.7–7.7)
Neutrophils Relative %: 66 %
PLATELETS: 294 10*3/uL (ref 150–400)
RBC: 4.38 MIL/uL (ref 3.87–5.11)
RDW: 13.6 % (ref 11.5–15.5)
WBC: 9.1 10*3/uL (ref 4.0–10.5)

## 2016-10-18 LAB — GLUCOSE, CAPILLARY
GLUCOSE-CAPILLARY: 178 mg/dL — AB (ref 65–99)
GLUCOSE-CAPILLARY: 137 mg/dL — AB (ref 65–99)
Glucose-Capillary: 202 mg/dL — ABNORMAL HIGH (ref 65–99)
Glucose-Capillary: 133 mg/dL — ABNORMAL HIGH (ref 65–99)

## 2016-10-18 MED ORDER — MAGNESIUM SULFATE 2 GM/50ML IV SOLN
2.0000 g | Freq: Once | INTRAVENOUS | Status: AC
  Administered 2016-10-18: 2 g via INTRAVENOUS
  Filled 2016-10-18: qty 50

## 2016-10-18 MED ORDER — PREMIER PROTEIN SHAKE
11.0000 [oz_av] | Freq: Two times a day (BID) | ORAL | Status: DC
Start: 2016-10-18 — End: 2016-10-20
  Administered 2016-10-19 – 2016-10-20 (×3): 11 [oz_av] via ORAL
  Filled 2016-10-18 (×5): qty 325.31

## 2016-10-18 MED ORDER — FLUCONAZOLE 100 MG PO TABS
200.0000 mg | ORAL_TABLET | Freq: Every day | ORAL | Status: DC
  Administered 2016-10-18 – 2016-10-20 (×3): 200 mg via ORAL
  Filled 2016-10-18 (×3): qty 2

## 2016-10-18 MED ORDER — ADULT MULTIVITAMIN W/MINERALS CH
1.0000 | ORAL_TABLET | Freq: Every day | ORAL | Status: DC
  Administered 2016-10-19 – 2016-10-20 (×2): 1 via ORAL
  Filled 2016-10-18 (×2): qty 1

## 2016-10-18 NOTE — Progress Notes (Signed)
PROGRESS NOTE [facility]   [place]   LFY:[id number] DOB: 24-Sep-1945  DOA: 10/13/2016 PCP: Lindie Spruce, MD   Brief Narrative:  Jodi Underwood a 71 y.o.femalewith medical history significant of morbid obesity, lymphedema, hypertension, diabetes mellitus, asthma, anemia, neuropathy, urinary incontinence, who presents with leg pain. Patient states that she has bilateral lower leg pain since Wednesday. The pain is constantly, 8/10,worsening on the right lower leg. Pt states that her daughter noticed several wounds over her lower legs on Wednesday. She was seen by PCP on Thursday, but it did not give her prescription of antibiotics. He PCPrecommended local wound care with a home nurse, but her wound care nurse noticed "black skin changes" and called EMS.Patient also states that her bilateral leg edema has been worsening and her leg has been weeping more recently form their baseline. Patient denies any fever, chills, chest pain, shortness breath, nausea, vomiting, diarrhea, abdominal pain, symptoms of UTI. No unilateral weakness. Was admitted for a suspected Cellulitis in the setting of severe chronic lymphedema. States she felt a little nauseous today and vomited once. Thinks she is feeling better. PT Evaluated and patient needs SNF so have consulted Social work for bed placement.  Subjective:    Assessment & Plan: Chronic lymphedema with superimposed cellulitis and onychomycosis -  improving -Pt is not septic. Lactic acid is normal at 1.1. Hemodynamically stable -On IV ceftriaxone -, blood cultures so far negative, will switch to oral abx in am  -Will Add diflucan 200 mg qw -PRN Oxycodone-Acetaminophen 2 tablets po q6hprn for Severe Pain -Blood cultures x 2 done and showed NGTD  -Wound care consult Appreciated -> Recommended that Nursing to cleanse and moisturize her buttocks and LEs, also to float the heels so that we do not get a pressure injury in house. Needs  legs wrapped from toe to Hip and need to follow up with [facility] or [facility] for Lymphedema management.  -LE Venous Doppler was unable to visualize most lower extremity veins due to patient body habitus and pain tolerance, visualized portions appear negative for thrombosis -PT consulted and recommend SNF as she is requiring greater than 3 person for assist for mobility -Corporate treasurer for SNF placement.   DM-II complicated by Neuropathy -Last A1c 6.6 on 04/30/16, well controled.  -Patient not taking insulin at home  -SSI   AKI, worsened today Cr slight up to 1.20 -Patient report poor oral intake for the past days  -ARB's and Lasix on hold  -Repeat CMP in AM  HTN: - BP stable off medications  -Continue to hold ARB and Lasix  -C/w Amlodipine 10 mg po Daily qHS -IV Hydralazine 5 mg IV q2hprn for SBP >175  Asthma:  -Stable -C/w prn Albuterol Nebs 2.5 mg q6hprn for Wheezing or SOB.  Morbid Obesity -Patient's BMI was 72.8 -Weight Loss Counseling given -Nutritionist Consultation Appreciated  Hypomagnesemia -Mag Level was 1.6 -Replete with IV Mag Sulfate 2 grams -Repeat Mag Level in AM  DVT prophylaxis: Lovenox  Code Status: Full  Family Communication: None at bedside  Disposition Plan: SNF in next 24-48 hrs   Consultants:   WOC   Procedures:     Antimicrobials:  Rocephin    Objective: Vitals:   10/17/16 2129 10/18/16 0643 10/18/16 1025 10/18/16 1525  BP: (!) 145/67 (!) 151/74  135/87  Pulse: 91 87  81  Resp: 20 20  (!) 24  Temp: 98.5 F (36.9 C) 98.1 F (36.7 C)  98 F (36.7 C)  TempSrc: Oral Oral  Oral  SpO2: 93% 97% 94% 96%  Weight:      Height:        Intake/Output Summary (Last 24 hours) at 10/18/16 1908 Last data filed at 10/18/16 1139  Gross per 24 hour  Intake              170 ml  Output                0 ml  Net              170 ml   Filed Weights   10/13/16 1822 10/13/16 2343  Weight: (!) 204.1 kg  (450 lb) (!) 204.1 kg (449 lb 15.3 oz)    Examination:  General exam: Appears calm and comfortable  Respiratory system: Clear to auscultation. No wheezes,crackle or rhonchi Cardiovascular system: S1 & S2 heard, RRR. No JVD, murmurs, rubs or gallops Gastrointestinal system: Obese, soft NTND  Central nervous system: Alert and oriented. No focal neurological deficits. Extremities: Severe b/l lymphedema, chronic skin changes, onychomycosis  Skin: Skin induration, no erythema noted, diffuse discoloration. Ulceration on r plantar area  Psychiatry: Judgement and insight appear normal. Mood & affect appropriate.    Data Reviewed: I have personally reviewed following labs and imaging studies  CBC:  Recent Labs Lab 10/13/16 1958 10/14/16 0510 10/15/16 0523 10/16/16 0440 10/17/16 0853 10/18/16 0504  WBC 8.5 8.4 7.8 9.5 9.1 9.1  NEUTROABS 5.4  --  4.0 5.7 5.6 6.0  HGB 11.9* 10.4* 10.0* 10.6* 11.3* 11.4*  HCT 38.5 33.3* 33.1* 34.1* 35.8* 37.0  MCV 85.0 85.2 83.0 82.0 81.5 84.5  PLT 307 303 281 300 300 235   Basic Metabolic Panel:  Recent Labs Lab 10/14/16 0510 10/14/16 2034 10/15/16 0523 10/16/16 0440 10/17/16 0853 10/18/16 0504  NA 138  --  139 138 136 137  K 3.9  --  4.1 3.9 3.8 3.7  CL 104  --  105 104 100* 98*  CO2 26  --  28 25 27 28   GLUCOSE 138*  --  162* 160* 175* 230*  BUN 21*  --  16 12 9 10   CREATININE 1.28*  --  1.05* 0.98 0.91 1.20*  CALCIUM 8.9  --  8.6* 8.7* 8.8* 8.6*  MG  --  1.6*  --  1.7 1.5* 1.6*  PHOS  --   --  3.3 2.8 3.2 3.5   GFR: Estimated Creatinine Clearance: 79.6 mL/min (A) (by C-G formula based on SCr of 1.2 mg/dL (H)). Liver Function Tests:  Recent Labs Lab 10/15/16 0523 10/16/16 0440 10/17/16 0853 10/18/16 0504  AST 22 24 23 29   ALT 17 17 20 20   ALKPHOS 55 57 63 62  BILITOT 0.7 0.6 0.5 0.6  PROT 5.7* 6.4* 6.5 6.4*  ALBUMIN 2.7* 3.0* 3.0* 3.0*   No results for input(s): LIPASE, AMYLASE in the last 168 hours. No results for  input(s): AMMONIA in the last 168 hours. Coagulation Profile: No results for input(s): INR, PROTIME in the last 168 hours. Cardiac Enzymes: No results for input(s): CKTOTAL, CKMB, CKMBINDEX, TROPONINI in the last 168 hours. BNP (last 3 results) No results for input(s): PROBNP in the last 8760 hours. HbA1C: No results for input(s): HGBA1C in the last 72 hours. CBG:  Recent Labs Lab 10/17/16 1719 10/17/16 2126 10/18/16 0814 10/18/16 1204 10/18/16 1712  GLUCAP 201* 145* 202* 178* 137*   Lipid Profile: No results for input(s): CHOL, HDL, LDLCALC, TRIG, CHOLHDL, LDLDIRECT in the last 72  hours. Thyroid Function Tests: No results for input(s): TSH, T4TOTAL, FREET4, T3FREE, THYROIDAB in the last 72 hours. Anemia Panel: No results for input(s): VITAMINB12, FOLATE, FERRITIN, TIBC, IRON, RETICCTPCT in the last 72 hours. Sepsis Labs:  Recent Labs Lab 10/13/16 2225 10/14/16 0108  PROCALCITON 0.18  --   LATICACIDVEN 1.2 1.1    Recent Results (from the past 240 hour(s))  Culture, blood (routine x 2)     Status: None (Preliminary result)   Collection Time: 10/13/16 10:25 PM  Result Value Ref Range Status   Specimen Description BLOOD RIGHT ANTECUBITAL  Final   Special Requests BOTTLES DRAWN AEROBIC AND ANAEROBIC 5CC  Final   Culture   Final    NO GROWTH 4 DAYS Performed at [facility], [place] [address] [place], [place] [address]    Report Status PENDING  Incomplete  Culture, blood (routine x 2)     Status: None (Preliminary result)   Collection Time: 10/14/16 12:18 AM  Result Value Ref Range Status   Specimen Description BLOOD RIGHT ANTECUBITAL  Final   Special Requests BOTTLES DRAWN AEROBIC ONLY 6CC  Final   Culture   Final    NO GROWTH 4 DAYS Performed at [facility], [place] [address], [place], [place] [address]    Report Status PENDING  Incomplete    Radiology Studies: No results found.    Scheduled Meds: . amLODipine  10 mg Oral QHS  . cefTRIAXone  (ROCEPHIN)  IV  2 g Intravenous Q24H  . dextromethorphan-guaiFENesin  1 tablet Oral BID  . enoxaparin (LOVENOX) injection  100 mg Subcutaneous QHS  . fluconazole  200 mg Oral Daily  . insulin aspart  0-5 Units Subcutaneous QHS  . insulin aspart  0-9 Units Subcutaneous TID WC  . multivitamin with minerals  1 tablet Oral Daily  . protein supplement shake  11 oz Oral BID BM  . sodium chloride flush  3 mL Intravenous Q12H   Continuous Infusions:   LOS: 5 days    Chipper Oman, MD Pager: Text Page via [email or web address]  [phone]  If 7PM-7AM, please contact night-coverage [email or web address] Password [email or web address] 10/18/2016, 7:08 PM

## 2016-10-18 NOTE — Care Management Important Message (Signed)
Important Message  Patient Details  Name: Emmy Keng MRN: [id number] Date of Birth: May 30, 1946   Medicare Important Message Given:  Yes    Kerin Salen 10/18/2016, 10:21 AMImportant Message  Patient Details  Name: Sheylin Scharnhorst MRN: [id number] Date of Birth: 1946/03/09   Medicare Important Message Given:  Yes    Kerin Salen 10/18/2016, 10:21 AM

## 2016-10-18 NOTE — Progress Notes (Signed)
Inpatient Diabetes Program Recommendations  AACE/ADA: New Consensus Statement on Inpatient Glycemic Control (2015)  Target Ranges:  Prepandial:   less than 140 mg/dL      Peak postprandial:   less than 180 mg/dL (1-2 hours)      Critically ill patients:  140 - 180 mg/dL   Results for Jodi Underwood, Jodi Underwood (MRN [id number]) as of 10/18/2016 14:04  Ref. Range 10/17/2016 08:08 10/17/2016 11:53 10/17/2016 17:19 10/17/2016 21:26  Glucose-Capillary Latest Ref Range: 65 - 99 mg/dL 167 (H) 188 (H) 201 (H) 145 (H)   Results for Jodi Underwood, Jodi Underwood (MRN [id number]) as of 10/18/2016 14:04  Ref. Range 10/18/2016 08:14 10/18/2016 12:04  Glucose-Capillary Latest Ref Range: 65 - 99 mg/dL 202 (H) 178 (H)    Admit with: Bilateral LE Cellulitis/ Lymphedema  History: DM  Home DM Meds: NPH 50 units BID  Current Insulin Orders: Novolog Sensitive Correction Scale/ SSI (0-9 units) TID AC + HS      MD- Patient having occasional elevations of glucose levels.  Takes fair amount of NPH Insulin at home.  Please consider starting a small portion of pt's home dose of NPH Insulin:  Recommend NPH 12 units BID (25% total home dose)     --Will follow patient during hospitalization--  Wyn Quaker RN, MSN, CDE Diabetes Coordinator Inpatient Glycemic Control Team Team Pager: [phone] (8a-5p)

## 2016-10-18 NOTE — Clinical Social Work Note (Signed)
Clinical Social Work Assessment  Patient Details  Name: Jodi Underwood MRN: [id number] Date of Birth: April 14, 1946  Date of referral:  10/18/16               Reason for consult:  Facility Placement                Permission sought to share information with:  Facility Art therapist granted to share information::  Yes, Verbal Permission Granted  Name::        Agency::     Relationship::     Contact Information:     Housing/Transportation Living arrangements for the past 2 months:  Apartment Source of Information:  Patient Patient Interpreter Needed:  None Criminal Activity/Legal Involvement Pertinent to Current Situation/Hospitalization:  No - Comment as needed Significant Relationships:  Adult Children Lives with:  Self Do you feel safe going back to the place where you live?  Yes Need for family participation in patient care:  No (Coment)  Care giving concerns:  Patient resides alone in apartment. Patient reports that she requires assistance with wrapping for her lymphedema. Patient reports that her daughter helps her sometimes but is not able to provide continuous care due to her schedule.    Social Worker assessment / plan:  CSW spoke with patient at bedside regarding SNF placement. CSW explained placement process to patient. Patient reported that she is not aware of any facilities that can care for her lymphedema, CSW agreed to inquire. CSW will start placement process and continue to assist patient with discharge planning.   Employment status:  Retired Forensic scientist:  Medicare PT Recommendations:  [place] / Referral to community resources:  [place]  Patient/Family's Response to care:  Patient agreeable to SNF to receive care for lymphedema and rehab.  Patient/Family's Understanding of and Emotional Response to Diagnosis, Current Treatment, and Prognosis:  Patient reports that she has been dealing with  her lymphedema since 2014. Patient reports that it has been frustrating not been able to get the care that she needs, CSW validated patient's feelings.   Emotional Assessment Appearance:  Appears stated age Attitude/Demeanor/Rapport:  Other (Cooperative) Affect (typically observed):  Calm Orientation:  Oriented to Self, Oriented to Place, Oriented to  Time, Oriented to Situation Alcohol / Substance use:  Not Applicable Psych involvement (Current and /or in the community):  No (Comment)  Discharge Needs  Concerns to be addressed:  No discharge needs identified Readmission within the last 30 days:  No Current discharge risk:  None Barriers to Discharge:  No Barriers Identified   Burnis Medin, LCSW 10/18/2016, 10:41 AM

## 2016-10-18 NOTE — Progress Notes (Signed)
Nutrition Education Note   RD consulted for nutrition education regarding diabetes.   Lab Results  Component Value Date   HGBA1C 6.6 (H) 04/30/2016    Discussed different food groups and their effects on blood sugar, emphasizing carbohydrate-containing foods. Provided list of carbohydrates and recommended serving sizes of common foods.  Discussed importance of controlled and consistent carbohydrate intake throughout the day. Provided examples of ways to balance meals/snacks and encouraged intake of high-fiber, whole grain complex carbohydrates. Teach back method used.  Expect fair compliance.  Body mass index is 72.63 kg/m. Pt meets criteria for morbid obesity based on current BMI.  Current diet order is heart healthy/carbohydrate controlled, patient is consuming approximately 100% of meals at this time. Labs and medications reviewed.   RD following this pt  Koleen Distance, RD, LDN Pager #[phone]

## 2016-10-18 NOTE — Progress Notes (Signed)
Report received from [facility]. No change in assessment. Stacey Drain

## 2016-10-18 NOTE — NC FL2 (Signed)
[place] LEVEL OF CARE SCREENING TOOL     IDENTIFICATION  Patient Name: Jodi Underwood Birthdate: 12-27-1945 Sex: female Admission Date (Current Location): 10/13/2016  [facility] and [place] Number:  Herbalist and Address:  [facility],  [place] [place], [place]      Provider Number: [id number]  Attending Physician Name and Address:  Doreatha Lew, MD  Relative Name and Phone Number:       Current Level of Care: Hospital Recommended Level of Care: [place] Prior Approval Number:    Date Approved/Denied:   PASRR Number: [id number]  Discharge Plan: SNF    Current Diagnoses: Patient Active Problem List   Diagnosis Date Noted  . Cellulitis, leg 10/13/2016  . AKI (acute kidney injury) ([place]) 10/13/2016  . Hypertension   . Lymphedema of both lower extremities   . Abnormal CT of the abdomen   . Pressure injury of skin 04/28/2016  . Intractable lower abdominal pain 04/27/2016  . Constipation   . Respiratory distress   . Acute cystitis without hematuria   . Sepsis ([place]) 09/26/2014  . UTI (urinary tract infection) 09/26/2014  . Asthma 09/26/2014  . Asthma with acute exacerbation   . Blood poisoning ([place])   . Urinary tract infectious disease   . Diabetes type 2, uncontrolled ([place])   . Acute on chronic renal failure ([place])   . Essential hypertension, benign   . Acute renal failure ([place]) 01/25/2013  . Cellulitis of leg, bilateral 01/23/2013  . Ulcers of both lower extremities ([place]) 01/23/2013  . Lymphedema 06/13/2011  . DM type 2, uncontrolled, with neuropathy ([place]) 06/23/2010  . Morbid obesity ([place]) 06/23/2010  . ANEMIA-NOS 06/23/2010  . Essential hypertension 06/23/2010  . Asthma 06/23/2010    Orientation RESPIRATION BLADDER Height & Weight     Self, Time, Situation, Place  Normal Incontinent Weight: (!) 449 lb 15.3 oz (204.1 kg) Height:  5\' 6"  (167.6 cm)  BEHAVIORAL SYMPTOMS/MOOD  NEUROLOGICAL BOWEL NUTRITION STATUS      Incontinent Diet (heart healthy/carb modified )  AMBULATORY STATUS COMMUNICATION OF NEEDS Skin   Total Care Verbally Other (Comment) ( PressureInjury03/31/18StageI-Intactskinwithnon-blanchablerednessofalocalizedareausuallyoverabonyprominence;Wound/Incision(OpenorDehisced)03/31/18Non-pressurewoundThighRight;Wound/Incision(OpenorDehisced)03/31/18Non-) Pressure wound Leg Right Lower;  Wound / Incision (Open or Dehisced) 10/02/14 Other (Comment) Abdomen Left fissure in skin fold    Lymphedema                  Personal Care Assistance Level of Assistance  Bathing, Dressing, Feeding Bathing Assistance: Maximum assistance Feeding assistance: Independent Dressing Assistance: Maximum assistance     Functional Limitations Info             SPECIAL CARE FACTORS FREQUENCY                       Contractures Contractures Info: Not present    Additional Factors Info  Code Status, Allergies Code Status Info: Full Code  Allergies Info: NKA           Current Medications (10/18/2016):  This is the current hospital active medication list Current Facility-Administered Medications  Medication Dose Route Frequency Provider Last Rate Last Dose  . acetaminophen (TYLENOL) tablet 650 mg  650 mg Oral Q6H PRN Ivor Costa, MD   650 mg at 10/18/16 1055   Or  . acetaminophen (TYLENOL) suppository 650 mg  650 mg Rectal Q6H PRN Ivor Costa, MD      . albuterol (PROVENTIL) (2.5 MG/3ML) 0.083% nebulizer solution 2.5 mg  2.5 mg Nebulization  Q6H PRN Ivor Costa, MD   2.5 mg at 10/17/16 0453  . amLODipine (NORVASC) tablet 10 mg  10 mg Oral QHS Omair Latif Sheikh, DO   10 mg at 10/17/16 2010  . cefTRIAXone (ROCEPHIN) 2 g in dextrose 5 % 50 mL IVPB  2 g Intravenous Q24H Espino, DO   2 g at 10/17/16 2010  . dextromethorphan-guaiFENesin (MUCINEX DM) 30-600 MG per 12 hr tablet 1 tablet  1 tablet Oral BID [place],  DO   1 tablet at 10/18/16 1055  . enoxaparin (LOVENOX) injection 100 mg  100 mg Subcutaneous QHS Ivor Costa, MD   100 mg at 10/17/16 2010  . guaiFENesin (ROBITUSSIN) 100 MG/5ML solution 100 mg  5 mL Oral Q4H PRN Bertram Savin Sheikh, DO   100 mg at 10/16/16 0810  . hydrALAZINE (APRESOLINE) injection 5 mg  5 mg Intravenous Q2H PRN Ivor Costa, MD      . insulin aspart (novoLOG) injection 0-5 Units  0-5 Units Subcutaneous QHS Ivor Costa, MD      . insulin aspart (novoLOG) injection 0-9 Units  0-9 Units Subcutaneous TID WC Ivor Costa, MD   3 Units at 10/18/16 1058  . ondansetron (ZOFRAN) tablet 4 mg  4 mg Oral Q6H PRN Ivor Costa, MD       Or  . ondansetron [facility]) injection 4 mg  4 mg Intravenous Q6H PRN Ivor Costa, MD   4 mg at 10/17/16 1102  . oxyCODONE-acetaminophen (PERCOCET/ROXICET) 5-325 MG per tablet 2 tablet  2 tablet Oral Q6H PRN Ivor Costa, MD   2 tablet at 10/13/16 2239  . sodium chloride (OCEAN) 0.65 % nasal spray 1 spray  1 spray Each Nare PRN Omair Latif Sheikh, DO      . sodium chloride flush (NS) 0.9 % injection 3 mL  3 mL Intravenous Q12H Ivor Costa, MD   3 mL at 10/18/16 1058  . zolpidem (AMBIEN) tablet 5 mg  5 mg Oral QHS PRN Ivor Costa, MD         Discharge Medications: Please see discharge summary for a list of discharge medications.  Relevant Imaging Results:  Relevant Lab Results:   Additional Information ss# [id number]  Burnis Medin, LCSW

## 2016-10-18 NOTE — Progress Notes (Signed)
Initial Nutrition Assessment  DOCUMENTATION CODES:   Morbid obesity  INTERVENTION:   Premier Protein BID, each supplement provides 160kcal and 30g protein.   MVI  NUTRITION DIAGNOSIS:   Increased nutrient needs related to wound healing as evidenced by increased estimated needs from protein.  GOAL:   Patient will meet greater than or equal to 90% of their needs  MONITOR:   PO intake, Supplement acceptance, Labs, Skin  REASON FOR ASSESSMENT:   Low [place]aden    ASSESSMENT:   71 y.o. female with medical history significant of morbid obesity, lymphedema, hypertension, diabetes mellitus, asthma, anemia, neuropathy, urinary incontinence, who presents with leg pain.  Admitted for cellulitis    Met with pt in room today. Pt reports poor appetite and oral intake for two weeks pta. Pt currently eating 100% meals. Pt with multiple wounds and cellulitis. Per chart, pt with weight gain. RD discussed with pt the importance of adequate protein intake for wound healing. RD also discussed the detrimental effects of sugar to wound healing. Pt was educated on diabetic diet today. Pt would like to try Premier Protein; pt will need need at least two supplements per day in addition to her regular meals and snacks to try and meet protein needs.   Medications reviewed and include: ceftriaxone, lovenox, insulin  Labs reviewed: Cl 98(L), creat 1.2(H), Ca 8.6(L) adj. 9.4 wnl, Mg 1.6(L), Alb 3.0(L) cbgs- 175, 230 x 24 hrs  Nutrition-Focused physical exam completed. Findings are no fat depletion, no muscle depletion, and moderate edema.   Diet Order:  Diet heart healthy/carb modified Room service appropriate? Yes; Fluid consistency: Thin  Skin:   Stage I heel, non pressure injuries legs and thighs, skin fold fissure, cellulitis leg, and lymphedema   Last BM:  4/2  Height:   Ht Readings from Last 1 Encounters:  10/13/16 5' 6"  (1.676 m)    Weight:   Wt Readings from Last 1 Encounters:  10/13/16  (!) 449 lb 15.3 oz (204.1 kg)    Ideal Body Weight:  59 kg  BMI:  Body mass index is 72.63 kg/m.  Estimated Nutritional Needs:   Kcal:  2500-2800kcal/day   Protein:  122-160g/day   Fluid:  >2.5L/day   EDUCATION NEEDS:   Education needs addressed  Koleen Distance, RD, LDN Pager #- [phone]

## 2016-10-19 ENCOUNTER — Inpatient Hospital Stay (HOSPITAL_COMMUNITY): Payer: Medicare Other

## 2016-10-19 LAB — CULTURE, BLOOD (ROUTINE X 2)
CULTURE: NO GROWTH
CULTURE: NO GROWTH

## 2016-10-19 LAB — GLUCOSE, CAPILLARY
GLUCOSE-CAPILLARY: 190 mg/dL — AB (ref 65–99)
Glucose-Capillary: 145 mg/dL — ABNORMAL HIGH (ref 65–99)
Glucose-Capillary: 146 mg/dL — ABNORMAL HIGH (ref 65–99)

## 2016-10-19 LAB — BASIC METABOLIC PANEL
Anion gap: 9 (ref 5–15)
BUN: 15 mg/dL (ref 6–20)
CHLORIDE: 100 mmol/L — AB (ref 101–111)
CO2: 27 mmol/L (ref 22–32)
CREATININE: 1.19 mg/dL — AB (ref 0.44–1.00)
Calcium: 8.2 mg/dL — ABNORMAL LOW (ref 8.9–10.3)
GFR calc non Af Amer: 45 mL/min — ABNORMAL LOW (ref >60)
GFR, EST AFRICAN AMERICAN: 52 mL/min — AB (ref >60)
Glucose, Bld: 153 mg/dL — ABNORMAL HIGH (ref 65–99)
POTASSIUM: 3.8 mmol/L (ref 3.5–5.1)
SODIUM: 136 mmol/L (ref 135–145)

## 2016-10-19 MED ORDER — IPRATROPIUM-ALBUTEROL 0.5-2.5 (3) MG/3ML IN SOLN
3.0000 mL | Freq: Four times a day (QID) | RESPIRATORY_TRACT | Status: DC | PRN
  Administered 2016-10-19: 3 mL via RESPIRATORY_TRACT

## 2016-10-19 NOTE — Progress Notes (Signed)
PT Cancellation Note  Patient Details Name: Jodi Underwood MRN: [id number] DOB: 28-Jun-1946   Cancelled Treatment:    Reason Eval/Treat Not Completed: Pt declined to participate with PT on today. Will check back another day.    Weston Anna, MPT Pager: [phone]

## 2016-10-19 NOTE — Progress Notes (Signed)
Spoke with representative at [facility] and was informed facility unable to assist with leg wrapping needs. Explained SNF in general unable to provide this service. Discussed with pt at bedside. Pt expressed she prefers to return home rather than transfer to SNF, explains she had begun receiving home health services prior to hospitalization and wishes to continue. Pt expressed frustration in her pursuit of care for her leg, CSW validated pt's emotional response and provided support. CSW signing off.   Sharren Bridge, MSW, LCSW Clinical Social Work 10/19/2016 [phone]

## 2016-10-19 NOTE — Progress Notes (Signed)
Patient has been refusing being turned and also refusing to be cleaned up this AM. She states she had a bad night and was unable to sleep and so does not want to be cleaned up until later today.

## 2016-10-19 NOTE — Progress Notes (Signed)
PROGRESS NOTE [facility]   [place]   IPJ:[id number] DOB: Nov 15, 1945  DOA: 10/13/2016 PCP: Lindie Spruce, MD   Brief Narrative:  Jodi Underwood a 70 y.o.femalewith medical history significant of morbid obesity, lymphedema, hypertension, diabetes mellitus, asthma, anemia, neuropathy, urinary incontinence, who presents with leg pain. Patient states that she has bilateral lower leg pain since Wednesday. The pain is constantly, 8/10,worsening on the right lower leg. Pt states that her daughter noticed several wounds over her lower legs on Wednesday. She was seen by PCP on Thursday, but it did not give her prescription of antibiotics. He PCPrecommended local wound care with a home nurse, but her wound care nurse noticed "black skin changes" and called EMS.Patient also states that her bilateral leg edema has been worsening and her leg has been weeping more recently form their baseline. Patient denies any fever, chills, chest pain, shortness breath, nausea, vomiting, diarrhea, abdominal pain, symptoms of UTI. No unilateral weakness. Was admitted for a suspected Cellulitis in the setting of severe chronic lymphedema. States she felt a little nauseous today and vomited once. Thinks she is feeling better. PT Evaluated and patient needs SNF so have consulted Social work for bed placement.  Subjective: Patient seen and examined, cellulitis has improved, today c/o of mild SOB, nasal congestion and wheezing. Afebrile. Patient refused SNF due to no lymphedema therapy available.   Assessment & Plan: Chronic lymphedema with superimposed cellulitis and onychomycosis -  improving -Pt is not septic. Lactic acid is normal at 1.1. Hemodynamically stable -On IV ceftriaxone -, blood cultures so far negative, will switch to oral abx in am  -Continue diflucan 200 mg qw -PRN Oxycodone-Acetaminophen 2 tablets po q6hprn for Severe Pain -Blood cultures x 2 done and showed NGTD  -Wound care  consult Appreciated -> Recommended that Nursing to cleanse and moisturize her buttocks and LEs, also to float the heels so that we do not get a pressure injury in house. Needs legs wrapped from toe to Hip and need to follow up with [facility] or [facility] for Lymphedema management.  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  -Patient not taking insulin at home  -SSI   AKI - Cr stable -Patient report poor oral intake for the past days  -ARB's and Lasix on hold  -Will give a bolus of IVF  -Check BMP in AM   HTN: - BP stable off medications  -Continue to hold ARB and Lasix  -C/w Amlodipine 10 mg po Daily qHS -IV Hydralazine 5 mg IV q2hprn for SBP >175  Asthma:  -Mild wheezing today  -Give duoneb, Obtained CXR no acute findings -Monitor   Morbid Obesity -Patient's BMI was 72.8 -Weight Loss Counseling given -Nutritionist Consultation Appreciated  Hypomagnesemia -Check Mag in AM   DVT prophylaxis: Lovenox  Code Status: Full  Family Communication: None at bedside  Disposition Plan: Home in AM    Consultants:   WOC   Procedures:     Antimicrobials:  Rocephin    Objective: Vitals:   10/18/16 2130 10/19/16 1042 10/19/16 1502 10/19/16 1655  BP: (!) 128/57  130/72   Pulse: 83  82   Resp: (!) 22  18  Temp: 98.6 F (37 C)  98.1 F (36.7 C)   TempSrc: Oral  Oral   SpO2: 95% 98% 99% 97%  Weight:      Height:        Intake/Output Summary (Last 24 hours) at 10/19/16 1718 Last data filed at 10/18/16 2300  Gross per 24 hour  Intake               50 ml  Output                0 ml  Net               50 ml   Filed Weights     10/13/16 1822 10/13/16 2343  Weight: (!) 204.1 kg (450 lb) (!) 204.1 kg (449 lb 15.3 oz)    Examination:  General exam: Appears to be anxious  Respiratory system: Anterior auscultation inspiratory wheezing. Good air entry  Cardiovascular system: S1 & S2 heard, RRR., murmurs, rubs or gallops Gastrointestinal system: Obese, soft NTND  Central nervous system: Alert and oriented.  Extremities: Severe b/l lymphedema, chronic skin changes, onychomycosis  Skin: Skin induration, no erythema noted, diffuse discoloration. Ulceration on r plantar area  Psychiatry: Judgement and insight appear normal. Mood & affect appropriate.    Data Reviewed: I have personally reviewed following labs and imaging studies  CBC:  Recent Labs Lab 10/13/16 1958 10/14/16 0510 10/15/16 0523 10/16/16 0440 10/17/16 0853 10/18/16 0504  WBC 8.5 8.4 7.8 9.5 9.1 9.1  NEUTROABS 5.4  --  4.0 5.7 5.6 6.0  HGB 11.9* 10.4* 10.0* 10.6* 11.3* 11.4*  HCT 38.5 33.3* 33.1* 34.1* 35.8* 37.0  MCV 85.0 85.2 83.0 82.0 81.5 84.5  PLT 307 303 281 300 300 109   Basic Metabolic Panel:  Recent Labs Lab 10/14/16 2034 10/15/16 0523 10/16/16 0440 10/17/16 0853 10/18/16 0504 10/19/16 0449  NA  --  139 138 136 137 136  K  --  4.1 3.9 3.8 3.7 3.8  CL  --  105 104 100* 98* 100*  CO2  --  28 25 27 28 27   GLUCOSE  --  162* 160* 175* 230* 153*  BUN  --  16 12 9 10 15   CREATININE  --  1.05* 0.98 0.91 1.20* 1.19*  CALCIUM  --  8.6* 8.7* 8.8* 8.6* 8.2*  MG 1.6*  --  1.7 1.5* 1.6*  --   PHOS  --  3.3 2.8 3.2 3.5  --    GFR: Estimated Creatinine Clearance: 80.2 mL/min (A) (by C-G formula based on SCr of 1.19 mg/dL (H)). Liver Function Tests:  Recent Labs Lab 10/15/16 0523 10/16/16 0440 10/17/16 0853 10/18/16 0504  AST 22 24 23 29   ALT 17 17 20 20   ALKPHOS 55 57 63 62  BILITOT 0.7 0.6 0.5 0.6  PROT 5.7* 6.4* 6.5 6.4*  ALBUMIN 2.7* 3.0* 3.0* 3.0*   No results for input(s): LIPASE, AMYLASE in the last 168 hours. No  results for input(s): AMMONIA in the last 168 hours. Coagulation Profile: No results for input(s): INR, PROTIME in the last 168 hours. Cardiac Enzymes: No results for input(s): CKTOTAL, CKMB, CKMBINDEX, TROPONINI in the last 168 hours. BNP (last 3 results) No results for input(s): PROBNP in the last 8760 hours. HbA1C: No results for input(s): HGBA1C in the last 72 hours. CBG:  Recent Labs Lab 10/18/16 1712 10/18/16 2142 10/19/16 0822 10/19/16 1141 10/19/16 1658  GLUCAP 137* 133* 145* 146* 190*   Lipid Profile: No results for  input(s): CHOL, HDL, LDLCALC, TRIG, CHOLHDL, LDLDIRECT in the last 72 hours. Thyroid Function Tests: No results for input(s): TSH, T4TOTAL, FREET4, T3FREE, THYROIDAB in the last 72 hours. Anemia Panel: No results for input(s): VITAMINB12, FOLATE, FERRITIN, TIBC, IRON, RETICCTPCT in the last 72 hours. Sepsis Labs:  Recent Labs Lab 10/13/16 2225 10/14/16 0108  PROCALCITON 0.18  --   LATICACIDVEN 1.2 1.1    Recent Results (from the past 240 hour(s))  Culture, blood (routine x 2)     Status: None   Collection Time: 10/13/16 10:25 PM  Result Value Ref Range Status   Specimen Description BLOOD RIGHT ANTECUBITAL  Final   Special Requests BOTTLES DRAWN AEROBIC AND ANAEROBIC 5CC  Final   Culture   Final    NO GROWTH 5 DAYS Performed at [facility], [place] [address] [place], [place] [address]    Report Status 10/19/2016 FINAL  Final  Culture, blood (routine x 2)     Status: None   Collection Time: 10/14/16 12:18 AM  Result Value Ref Range Status   Specimen Description BLOOD RIGHT ANTECUBITAL  Final   Special Requests BOTTLES DRAWN AEROBIC ONLY 6CC  Final   Culture   Final    NO GROWTH 5 DAYS Performed at [facility], [place] [address] [place], [place] [address]    Report Status 10/19/2016 FINAL  Final    Radiology Studies: Dg Chest Port 1 View  Result Date: 10/19/2016 CLINICAL DATA:  Short of breath. EXAM: PORTABLE CHEST 1 VIEW  COMPARISON:  04/27/2016 FINDINGS: There is mild enlargement of cardiac silhouette similar to the prior study. No mediastinal or hilar masses. No convincing adenopathy. Lungs show prominent bronchovascular markings without overt pulmonary edema. There is no evidence of pneumonia. No pleural effusion or pneumothorax. Skeletal structures are demineralized but grossly intact. IMPRESSION: 1. No acute cardiopulmonary disease.  Stable cardiomegaly. Electronically Signed   By: Lajean Manes M.D.   On: 10/19/2016 14:04    Scheduled Meds: . amLODipine  10 mg Oral QHS  . cefTRIAXone (ROCEPHIN)  IV  2 g Intravenous Q24H  . dextromethorphan-guaiFENesin  1 tablet Oral BID  . enoxaparin (LOVENOX) injection  100 mg Subcutaneous QHS  . fluconazole  200 mg Oral Daily  . insulin aspart  0-5 Units Subcutaneous QHS  . insulin aspart  0-9 Units Subcutaneous TID WC  . multivitamin with minerals  1 tablet Oral Daily  . protein supplement shake  11 oz Oral BID BM  . sodium chloride flush  3 mL Intravenous Q12H   Continuous Infusions:   LOS: 6 days    Chipper Oman, MD Pager: Text Page via [email or web address]  [phone]  If 7PM-7AM, please contact night-coverage [email or web address] Password [email or web address] 10/19/2016, 5:18 PM

## 2016-10-19 NOTE — Progress Notes (Signed)
Pt states she is active with [facility] and will continue. Pt asked for DME gel mattress and hoyer lift for home. Referral given to in house rep with [facility].

## 2016-10-20 LAB — BASIC METABOLIC PANEL
ANION GAP: 6 (ref 5–15)
BUN: 18 mg/dL (ref 6–20)
CALCIUM: 8.3 mg/dL — AB (ref 8.9–10.3)
CO2: 28 mmol/L (ref 22–32)
CREATININE: 1.02 mg/dL — AB (ref 0.44–1.00)
Chloride: 104 mmol/L (ref 101–111)
GFR, EST NON AFRICAN AMERICAN: 54 mL/min — AB (ref >60)
Glucose, Bld: 223 mg/dL — ABNORMAL HIGH (ref 65–99)
Potassium: 4 mmol/L (ref 3.5–5.1)
SODIUM: 138 mmol/L (ref 135–145)

## 2016-10-20 LAB — GLUCOSE, CAPILLARY
GLUCOSE-CAPILLARY: 171 mg/dL — AB (ref 65–99)
Glucose-Capillary: 193 mg/dL — ABNORMAL HIGH (ref 65–99)
Glucose-Capillary: 257 mg/dL — ABNORMAL HIGH (ref 65–99)
Glucose-Capillary: 164 mg/dL — ABNORMAL HIGH (ref 65–99)

## 2016-10-20 LAB — MAGNESIUM: MAGNESIUM: 1.9 mg/dL (ref 1.7–2.4)

## 2016-10-20 MED ORDER — FLUTICASONE PROPIONATE HFA 220 MCG/ACT IN AERO: 2.0000 | INHALATION_SPRAY | Freq: Two times a day (BID) | RESPIRATORY_TRACT | 0 refills | Status: DC

## 2016-10-20 MED ORDER — DOXYCYCLINE HYCLATE 50 MG PO CAPS: 50.0000 mg | ORAL_CAPSULE | Freq: Two times a day (BID) | ORAL | 0 refills | Status: AC

## 2016-10-20 MED ORDER — ALBUTEROL SULFATE HFA 108 (90 BASE) MCG/ACT IN AERS: 2.0000 | INHALATION_SPRAY | Freq: Four times a day (QID) | RESPIRATORY_TRACT | 1 refills | Status: DC | PRN

## 2016-10-20 MED ORDER — FLUTICASONE PROPIONATE 50 MCG/ACT NA SUSP: 2.0000 | Freq: Every day | NASAL | 2 refills | Status: DC

## 2016-10-20 MED ORDER — FLUCONAZOLE 200 MG PO TABS: 200.0000 mg | ORAL_TABLET | ORAL | 0 refills | Status: AC

## 2016-10-20 MED ORDER — IPRATROPIUM-ALBUTEROL 0.5-2.5 (3) MG/3ML IN SOLN: 3.0000 mL | Freq: Four times a day (QID) | RESPIRATORY_TRACT | 0 refills | Status: DC | PRN

## 2016-10-20 MED ORDER — OXYCODONE-ACETAMINOPHEN 5-325 MG PO TABS: 2.0000 | ORAL_TABLET | Freq: Four times a day (QID) | ORAL | 0 refills | Status: DC | PRN

## 2016-10-20 NOTE — Progress Notes (Signed)
[facility]   71 year old female with multiple comorbidities was admitted with suspected cellulitis in setting of severe chronic lymphedema. Treatment with the antibiotic significant improvement. Patient doing better creatinine stable, afebrile, normal white count. Erythema and cellulitis almost resolved. Patient complaining slight pain on the lower extremity but controlled with medication. Patient stable for discharge. Will sent doxycycline x 5 days for cellulitis and Diflucan for onychomycosis . She is to follow up with PCP for lymphedema treatment. Also prescribe Flovent and Duonebs for asthma.    Full d/c summary to follow   Chipper Oman, MD

## 2016-10-20 NOTE — Progress Notes (Signed)
   Patient dc'd to home. via [facility], tele box dc'd, IV dc'd, Patient states " I understand discharge instructions.

## 2016-10-20 NOTE — Progress Notes (Signed)
[facility] called for transportation home 

## 2016-10-20 NOTE — Progress Notes (Signed)
Inpatient Diabetes Program Recommendations  AACE/ADA: New Consensus Statement on Inpatient Glycemic Control (2015)  Target Ranges:  Prepandial:   less than 140 mg/dL      Peak postprandial:   less than 180 mg/dL (1-2 hours)      Critically ill patients:  140 - 180 mg/dL   Lab Results  Component Value Date   GLUCAP 257 (H) 10/20/2016   HGBA1C 6.6 (H) 04/30/2016    Review of Glycemic Control  Not taking insulin at home.  Results for [name], [name] (MRN [id number]) as of 10/20/2016 14:38  Ref. Range 10/19/2016 08:22 10/19/2016 11:41 10/19/2016 16:58 10/19/2016 22:45 10/20/2016 07:56 10/20/2016 12:20  Glucose-Capillary Latest Ref Range: 65 - 99 mg/dL 145 (H) 146 (H) 190 (H) 171 (H) 193 (H) 257 (H)   Needs tight glycemic control for healing.  Inpatient Diabetes Program Recommendations:    Need HgbA1C to assess glycemic control prior to hospitalization - Last one 6 months ago Needs small amount of basal insulin - NPH 12 units bid (25% home dose) If post-prandial blood sugars > 180 mg/dL, add Novolog 3 units tidwc Increase Novolog to 0-15 units tidwc and hs  Will continue to follow. Thank you. Lorenda Peck, RD, LDN, CDE Inpatient Diabetes Coordinator [phone]

## 2016-10-22 NOTE — Discharge Summary (Signed)
Physician Discharge Summary  Jodi Underwood  OIN:[id number]  DOB: [birth year]  DOA: 10/13/2016 PCP: Lindie Spruce, MD  Admit date: 10/13/2016 Discharge date: 10/22/2016  Admitted From: Home Disposition:  Home   Recommendations for Outpatient Follow-up:  1. Follow up with PCP in 1-2 weeks 2. Please obtain BMP/CBC in one week  Discharge Condition: Stable  CODE STATUS: FULL  Diet recommendation: Heart Healthy / Carb Modified / Regular / Dysphagia   Brief/Interim Summary: Jodi Underwood a 71 y.o.femalewith medical history significant of morbid obesity, lymphedema, hypertension, diabetes mellitus, asthma, anemia, neuropathy, urinary incontinence, who presents with leg pain. Patient states that she has bilateral lower leg pain since Wednesday. The pain is constantly, 8/10,worsening on the right lower leg. Pt states that her daughter noticed several wounds over her lower legs on Wednesday. She was seen by PCP on Thursday, but it did not give her prescription of antibiotics. He PCPrecommended local wound care with a home nurse, but her wound care nurse noticed "black skin changes" and called EMS.Patient also states that her bilateral leg edema has been worsening and her leg has been weeping more recently form their baseline. Patient denies any fever, chills, chest pain, shortness breath, nausea, vomiting, diarrhea, abdominal pain, symptoms of UTI. No unilateral weakness. Was admitted for a suspected Cellulitis in the setting of severe chronic lymphedema.   Discharge Diagnoses/Hospital Course:  Chronic lymphedema with superimposed cellulitis and onychomycosis -  improving Pt is not septic. Lactic acid is normal at 1.1. Hemodynamically stable Initially on IV ceftriaxone -, blood cultures so far negative, was transitioned to oral abx doxycycline x 5 days  Continue diflucan 200 mg qw x 6 weeks for onychomycosis  Blood cultures x 2 done and showed NGTD  Wound care consult Appreciated  ->Recommended  cleanse and moisturize her buttocks and LEs, also to float the heels. Needs legs wrapped from toe to Hip and need to follow up with [facility] or [facility] for Lymphedema management.  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 - CBG's where stable during hospital stay  No change in medications where made   AKI - Cr stable at the time of discharge  Likely due to dehydration in setting of ARB and Lasix  Improved with IVF  Check BMP in 1 week   HTN: - BP stable off medications  Resume Diovan  Amlodipine d/ced dut to peripheral edema  Lasix hold until seen by PCP due to recent AKI Monitor BP  Follow up with PCP   Asthma: - Stable  Albuterol and duoneb prescribed  Flovent added  Follow up with PCP   Morbid Obesity BMI 72.8 Weight loss advised   Hypomagnesemia - Resolved   All other chronic medical condition were stable during the hospitalization.  Patient was seen by physical therapy, recommendation was SNF but patient refuses due to no lymphedema treatment availability  On the day of the discharge the patient's vitals were stable, and no other acute medical condition were reported by patient. Patient was felt safe to be discharge to home   Discharge Instructions  You were cared for by a hospitalist during your hospital stay. If you have any questions about your discharge medications or the care you received while you were in the hospital after you are discharged, you can call the unit and asked to speak with the hospitalist on call  if the hospitalist that took care of you is not available. Once you are discharged, your primary care physician will handle any further medical issues. Please note that NO REFILLS for any discharge medications will be authorized once you are discharged, as it  is imperative that you return to your primary care physician (or establish a relationship with a primary care physician if you do not have one) for your aftercare needs so that they can reassess your need for medications and monitor your lab values.  Discharge Instructions    Call MD for:  difficulty breathing, headache or visual disturbances    Complete by:  As directed    Call MD for:  extreme fatigue    Complete by:  As directed    Call MD for:  hives    Complete by:  As directed    Call MD for:  persistant dizziness or light-headedness    Complete by:  As directed    Call MD for:  persistant nausea and vomiting    Complete by:  As directed    Call MD for:  redness, tenderness, or signs of infection (pain, swelling, redness, odor or green/yellow discharge around incision site)    Complete by:  As directed    Call MD for:  severe uncontrolled pain    Complete by:  As directed    Call MD for:  temperature >100.4    Complete by:  As directed    DME Nebulizer machine    Complete by:  As directed    Patient needs a nebulizer to treat with the following condition:  Asthma   Diet - low sodium heart healthy    Complete by:  As directed    Increase activity slowly    Complete by:  As directed      Allergies as of 10/20/2016   No Known Allergies     Medication List    STOP taking these medications   cephALEXin 500 MG capsule Commonly known as:  KEFLEX   furosemide 40 MG tablet Commonly known as:  LASIX   metoprolol tartrate 25 MG tablet Commonly known as:  LOPRESSOR   naproxen sodium 220 MG tablet Commonly known as:  ANAPROX   NOVOLIN N RELION 100 UNIT/ML injection Generic drug:  insulin NPH Human   traMADol 50 MG tablet Commonly known as:  ULTRAM     TAKE these medications   albuterol 108 (90 Base) MCG/ACT inhaler Commonly known as:  PROVENTIL HFA;VENTOLIN HFA Inhale 2 puffs into the lungs every 6 (six) hours as needed.   doxycycline 50 MG capsule Commonly known  as:  VIBRAMYCIN Take 1 capsule (50 mg total) by mouth 2 (two) times daily.   fluconazole 200 MG tablet Commonly known as:  DIFLUCAN Take 1 tablet (200 mg total) by mouth once a week.   fluticasone 220 MCG/ACT inhaler Commonly known as:  FLOVENT HFA Inhale 2 puffs into the lungs 2 (two) times daily.   fluticasone 50 MCG/ACT nasal spray Commonly known as:  FLONASE Place 2 sprays into both nostrils daily.   insulin aspart 100 UNIT/ML injection Commonly known as:  novoLOG Inject 8 Units into the skin 3 (three) times daily with meals.   ipratropium-albuterol 0.5-2.5 (3) MG/3ML Soln Commonly known as:  DUONEB Take 3 mLs by nebulization every 6 (six) hours as needed.   oxyCODONE-acetaminophen 5-325 MG tablet Commonly known as:  PERCOCET/ROXICET Take 2 tablets by mouth every 6 (six) hours as needed for severe pain.  valsartan 80 MG tablet Commonly known as:  DIOVAN Take 80 mg by mouth daily.   ZZZQUIL 25 MG Caps Generic drug:  DiphenhydrAMINE HCl (Sleep) Take 50 mg by mouth at bedtime as needed (sleep).            Durable Medical Equipment        Start     Ordered   10/20/16 0000  DME Nebulizer machine    Question:  Patient needs a nebulizer to treat with the following condition  Answer:  Asthma   10/20/16 1731     Follow-up Information    Lindie Spruce, MD. Schedule an appointment as soon as possible for a visit in 1 week(s).   Specialty:  Internal Medicine Contact information: [place] [address] [phone]          No Known Allergies  Consultations:  None    Procedures/Studies: Dg Chest Port 1 View  Result Date: 10/19/2016 CLINICAL DATA:  Short of breath. EXAM: PORTABLE CHEST 1 VIEW COMPARISON:  04/27/2016 FINDINGS: There is mild enlargement of cardiac silhouette similar to the prior study. No mediastinal or hilar masses. No convincing adenopathy. Lungs show prominent bronchovascular markings without overt pulmonary edema.  There is no evidence of pneumonia. No pleural effusion or pneumothorax. Skeletal structures are demineralized but grossly intact. IMPRESSION: 1. No acute cardiopulmonary disease.  Stable cardiomegaly. Electronically Signed   By: Lajean Manes M.D.   On: 10/19/2016 14:04     Discharge Exam: Vitals:   10/20/16 0624 10/20/16 1433  BP: 118/79 (!) 144/74  Pulse: 79 82  Resp: 17 18  Temp: 98.4 F (36.9 C) 98 F (36.7 C)   Vitals:   10/19/16 1655 10/19/16 2247 10/20/16 0624 10/20/16 1433  BP:  (!) 142/48 118/79 (!) 144/74  Pulse:  84 79 82  Resp:  18 17 18   Temp:  98.6 F (37 C) 98.4 F (36.9 C) 98 F (36.7 C)  TempSrc:  Oral Oral Oral  SpO2: 97% 96% 99% 98%  Weight:      Height:        General: Pt is alert, awake, not in acute distress   The results of significant diagnostics from this hospitalization (including imaging, microbiology, ancillary and laboratory) are listed below for reference.     Microbiology: Recent Results (from the past 240 hour(s))  Culture, blood (routine x 2)     Status: None   Collection Time: 10/13/16 10:25 PM  Result Value Ref Range Status   Specimen Description BLOOD RIGHT ANTECUBITAL  Final   Special Requests BOTTLES DRAWN AEROBIC AND ANAEROBIC 5CC  Final   Culture   Final    NO GROWTH 5 DAYS Performed at [facility], [address], [place], [place] [address]    Report Status 10/19/2016 FINAL  Final  Culture, blood (routine x 2)     Status: None   Collection Time: 10/14/16 12:18 AM  Result Value Ref Range Status   Specimen Description BLOOD RIGHT ANTECUBITAL  Final   Special Requests BOTTLES DRAWN AEROBIC ONLY 6CC  Final   Culture   Final    NO GROWTH 5 DAYS Performed at [facility], [place] [address] [place], [place] [address]    Report Status 10/19/2016 FINAL  Final     Labs: BNP (last 3 results)  Recent Labs  04/27/16 1632  BNP 36.1   Basic Metabolic Panel:  Recent Labs Lab 10/16/16 0440 10/17/16 4431  10/18/16 0504 10/19/16 0449  10/20/16 0538  NA 138 136 137 136 138  K 3.9 3.8 3.7 3.8 4.0  CL 104 100* 98* 100* 104  CO2 25 27 28 27 28   GLUCOSE 160* 175* 230* 153* 223*  BUN 12 9 10 15 18   CREATININE 0.98 0.91 1.20* 1.19* 1.02*  CALCIUM 8.7* 8.8* 8.6* 8.2* 8.3*  MG 1.7 1.5* 1.6*  --  1.9  PHOS 2.8 3.2 3.5  --   --    Liver Function Tests:  Recent Labs Lab 10/16/16 0440 10/17/16 0853 10/18/16 0504  AST 24 23 29   ALT 17 20 20   ALKPHOS 57 63 62  BILITOT 0.6 0.5 0.6  PROT 6.4* 6.5 6.4*  ALBUMIN 3.0* 3.0* 3.0*   No results for input(s): LIPASE, AMYLASE in the last 168 hours. No results for input(s): AMMONIA in the last 168 hours. CBC:  Recent Labs Lab 10/16/16 0440 10/17/16 0853 10/18/16 0504  WBC 9.5 9.1 9.1  NEUTROABS 5.7 5.6 6.0  HGB 10.6* 11.3* 11.4*  HCT 34.1* 35.8* 37.0  MCV 82.0 81.5 84.5  PLT 300 300 294   Cardiac Enzymes: No results for input(s): CKTOTAL, CKMB, CKMBINDEX, TROPONINI in the last 168 hours. BNP: Invalid input(s): POCBNP CBG:  Recent Labs Lab 10/19/16 1658 10/19/16 2245 10/20/16 0756 10/20/16 1220 10/20/16 1740  GLUCAP 190* 171* 193* 257* 164*   D-Dimer No results for input(s): DDIMER in the last 72 hours. Hgb A1c No results for input(s): HGBA1C in the last 72 hours. Lipid Profile No results for input(s): CHOL, HDL, LDLCALC, TRIG, CHOLHDL, LDLDIRECT in the last 72 hours. Thyroid function studies No results for input(s): TSH, T4TOTAL, T3FREE, THYROIDAB in the last 72 hours.  Invalid input(s): FREET3 Anemia work up No results for input(s): VITAMINB12, FOLATE, FERRITIN, TIBC, IRON, RETICCTPCT in the last 72 hours. Urinalysis    Component Value Date/Time   COLORURINE YELLOW 04/27/2016 2027   APPEARANCEUR CLOUDY (A) 04/27/2016 2027   LABSPEC 1.017 04/27/2016 2027   PHURINE 6.0 04/27/2016 2027   GLUCOSEU NEGATIVE 04/27/2016 2027   HGBUR MODERATE (A) 04/27/2016 2027   BILIRUBINUR NEGATIVE 04/27/2016 2027   KETONESUR NEGATIVE  04/27/2016 2027   PROTEINUR 30 (A) 04/27/2016 2027   UROBILINOGEN 0.2 09/29/2014 1224   NITRITE POSITIVE (A) 04/27/2016 2027   LEUKOCYTESUR MODERATE (A) 04/27/2016 2027   Sepsis Labs Invalid input(s): PROCALCITONIN,  WBC,  LACTICIDVEN Microbiology Recent Results (from the past 240 hour(s))  Culture, blood (routine x 2)     Status: None   Collection Time: 10/13/16 10:25 PM  Result Value Ref Range Status   Specimen Description BLOOD RIGHT ANTECUBITAL  Final   Special Requests BOTTLES DRAWN AEROBIC AND ANAEROBIC 5CC  Final   Culture   Final    NO GROWTH 5 DAYS Performed at [facility], [place] [address], [place], [place] [address]    Report Status 10/19/2016 FINAL  Final  Culture, blood (routine x 2)     Status: None   Collection Time: 10/14/16 12:18 AM  Result Value Ref Range Status   Specimen Description BLOOD RIGHT ANTECUBITAL  Final   Special Requests BOTTLES DRAWN AEROBIC ONLY 6CC  Final   Culture   Final    NO GROWTH 5 DAYS Performed at [facility], [place] [address] [place], [place] [address]    Report Status 10/19/2016 FINAL  Final    Time coordinating discharge: 40 minutes  SIGNED:  Chipper Oman, MD  [facility] 10/22/2016, 6:05 PM  Pager please text page  via  [email or web address] Password [email or web address]

## 2017-06-10 ENCOUNTER — Inpatient Hospital Stay (HOSPITAL_COMMUNITY)
Admission: EM | Admit: 2017-06-10 | Discharge: 2017-06-16 | DRG: 308 | Disposition: A | Discharge status: Skilled Nursing Facility | Payer: Medicare Other | Attending: Family Medicine | Admitting: Family Medicine

## 2017-06-10 ENCOUNTER — Encounter (HOSPITAL_COMMUNITY): Payer: Self-pay | Admitting: Emergency Medicine

## 2017-06-10 DIAGNOSIS — Z90710 Acquired absence of both cervix and uterus: Secondary | ICD-10-CM

## 2017-06-10 DIAGNOSIS — J45909 Unspecified asthma, uncomplicated: Secondary | ICD-10-CM | POA: Diagnosis present

## 2017-06-10 DIAGNOSIS — IMO0002 Reserved for concepts with insufficient information to code with codable children: Secondary | ICD-10-CM | POA: Diagnosis present

## 2017-06-10 DIAGNOSIS — I481 Persistent atrial fibrillation: Principal | ICD-10-CM | POA: Diagnosis present

## 2017-06-10 DIAGNOSIS — D649 Anemia, unspecified: Secondary | ICD-10-CM | POA: Diagnosis present

## 2017-06-10 DIAGNOSIS — J9600 Acute respiratory failure, unspecified whether with hypoxia or hypercapnia: Secondary | ICD-10-CM | POA: Diagnosis present

## 2017-06-10 DIAGNOSIS — E662 Morbid (severe) obesity with alveolar hypoventilation: Secondary | ICD-10-CM | POA: Diagnosis present

## 2017-06-10 DIAGNOSIS — R402362 Coma scale, best motor response, obeys commands, at arrival to emergency department: Secondary | ICD-10-CM | POA: Diagnosis present

## 2017-06-10 DIAGNOSIS — E1140 Type 2 diabetes mellitus with diabetic neuropathy, unspecified: Secondary | ICD-10-CM | POA: Diagnosis present

## 2017-06-10 DIAGNOSIS — I959 Hypotension, unspecified: Secondary | ICD-10-CM | POA: Diagnosis present

## 2017-06-10 DIAGNOSIS — R0602 Shortness of breath: Secondary | ICD-10-CM | POA: Diagnosis not present

## 2017-06-10 DIAGNOSIS — Z794 Long term (current) use of insulin: Secondary | ICD-10-CM

## 2017-06-10 DIAGNOSIS — K5900 Constipation, unspecified: Secondary | ICD-10-CM | POA: Diagnosis not present

## 2017-06-10 DIAGNOSIS — R402142 Coma scale, eyes open, spontaneous, at arrival to emergency department: Secondary | ICD-10-CM | POA: Diagnosis present

## 2017-06-10 DIAGNOSIS — M1990 Unspecified osteoarthritis, unspecified site: Secondary | ICD-10-CM | POA: Diagnosis present

## 2017-06-10 DIAGNOSIS — I4891 Unspecified atrial fibrillation: Secondary | ICD-10-CM | POA: Diagnosis present

## 2017-06-10 DIAGNOSIS — R402252 Coma scale, best verbal response, oriented, at arrival to emergency department: Secondary | ICD-10-CM | POA: Diagnosis present

## 2017-06-10 DIAGNOSIS — I10 Essential (primary) hypertension: Secondary | ICD-10-CM | POA: Diagnosis present

## 2017-06-10 DIAGNOSIS — I890 Lymphedema, not elsewhere classified: Secondary | ICD-10-CM | POA: Diagnosis present

## 2017-06-10 DIAGNOSIS — Z6841 Body Mass Index (BMI) 40.0 and over, adult: Secondary | ICD-10-CM

## 2017-06-10 DIAGNOSIS — Z79899 Other long term (current) drug therapy: Secondary | ICD-10-CM

## 2017-06-10 DIAGNOSIS — E1165 Type 2 diabetes mellitus with hyperglycemia: Secondary | ICD-10-CM

## 2017-06-10 HISTORY — DX: Localized edema: R60.0

## 2017-06-10 HISTORY — DX: Unspecified atrial fibrillation: I48.91

## 2017-06-10 NOTE — ED Triage Notes (Signed)
Brought by ems from home for c/o increased edema to lower extremities for the last two weeks.  On arrival of ems found to be in afib w/ rvr.  Reports being in afib many years ago.  Reports being SOB today.  Rate initially 140-170.  Given metoprolol 5mg  IV en route.  Reports rate came down to100.

## 2017-06-11 ENCOUNTER — Encounter (HOSPITAL_COMMUNITY): Payer: Self-pay | Admitting: Cardiology

## 2017-06-11 ENCOUNTER — Observation Stay (HOSPITAL_BASED_OUTPATIENT_CLINIC_OR_DEPARTMENT_OTHER): Payer: Medicare Other

## 2017-06-11 ENCOUNTER — Emergency Department (HOSPITAL_COMMUNITY): Payer: Medicare Other

## 2017-06-11 ENCOUNTER — Other Ambulatory Visit (HOSPITAL_COMMUNITY): Payer: Medicare Other

## 2017-06-11 DIAGNOSIS — J9600 Acute respiratory failure, unspecified whether with hypoxia or hypercapnia: Secondary | ICD-10-CM | POA: Diagnosis present

## 2017-06-11 DIAGNOSIS — Z6841 Body Mass Index (BMI) 40.0 and over, adult: Secondary | ICD-10-CM | POA: Diagnosis not present

## 2017-06-11 DIAGNOSIS — E662 Morbid (severe) obesity with alveolar hypoventilation: Secondary | ICD-10-CM | POA: Diagnosis present

## 2017-06-11 DIAGNOSIS — I340 Nonrheumatic mitral (valve) insufficiency: Secondary | ICD-10-CM | POA: Diagnosis not present

## 2017-06-11 DIAGNOSIS — R609 Edema, unspecified: Secondary | ICD-10-CM | POA: Diagnosis not present

## 2017-06-11 DIAGNOSIS — I10 Essential (primary) hypertension: Secondary | ICD-10-CM | POA: Diagnosis present

## 2017-06-11 DIAGNOSIS — Z79899 Other long term (current) drug therapy: Secondary | ICD-10-CM | POA: Diagnosis not present

## 2017-06-11 DIAGNOSIS — I481 Persistent atrial fibrillation: Secondary | ICD-10-CM | POA: Diagnosis present

## 2017-06-11 DIAGNOSIS — R402252 Coma scale, best verbal response, oriented, at arrival to emergency department: Secondary | ICD-10-CM | POA: Diagnosis present

## 2017-06-11 DIAGNOSIS — M1990 Unspecified osteoarthritis, unspecified site: Secondary | ICD-10-CM | POA: Diagnosis present

## 2017-06-11 DIAGNOSIS — D649 Anemia, unspecified: Secondary | ICD-10-CM

## 2017-06-11 DIAGNOSIS — I890 Lymphedema, not elsewhere classified: Secondary | ICD-10-CM | POA: Diagnosis present

## 2017-06-11 DIAGNOSIS — I959 Hypotension, unspecified: Secondary | ICD-10-CM | POA: Diagnosis present

## 2017-06-11 DIAGNOSIS — Z794 Long term (current) use of insulin: Secondary | ICD-10-CM | POA: Diagnosis not present

## 2017-06-11 DIAGNOSIS — E1165 Type 2 diabetes mellitus with hyperglycemia: Secondary | ICD-10-CM | POA: Diagnosis not present

## 2017-06-11 DIAGNOSIS — I4891 Unspecified atrial fibrillation: Secondary | ICD-10-CM | POA: Diagnosis not present

## 2017-06-11 DIAGNOSIS — K5900 Constipation, unspecified: Secondary | ICD-10-CM | POA: Diagnosis not present

## 2017-06-11 DIAGNOSIS — R0602 Shortness of breath: Secondary | ICD-10-CM | POA: Diagnosis present

## 2017-06-11 DIAGNOSIS — J4520 Mild intermittent asthma, uncomplicated: Secondary | ICD-10-CM | POA: Diagnosis not present

## 2017-06-11 DIAGNOSIS — R402142 Coma scale, eyes open, spontaneous, at arrival to emergency department: Secondary | ICD-10-CM | POA: Diagnosis present

## 2017-06-11 DIAGNOSIS — E1140 Type 2 diabetes mellitus with diabetic neuropathy, unspecified: Secondary | ICD-10-CM | POA: Diagnosis present

## 2017-06-11 DIAGNOSIS — J45909 Unspecified asthma, uncomplicated: Secondary | ICD-10-CM | POA: Diagnosis present

## 2017-06-11 DIAGNOSIS — R402362 Coma scale, best motor response, obeys commands, at arrival to emergency department: Secondary | ICD-10-CM | POA: Diagnosis present

## 2017-06-11 DIAGNOSIS — Z90710 Acquired absence of both cervix and uterus: Secondary | ICD-10-CM | POA: Diagnosis not present

## 2017-06-11 LAB — CBC WITH DIFFERENTIAL/PLATELET
BASOS PCT: 0 %
Basophils Absolute: 0 10*3/uL (ref 0.0–0.1)
EOS ABS: 0.1 10*3/uL (ref 0.0–0.7)
Eosinophils Relative: 1 %
HCT: 41.1 % (ref 36.0–46.0)
HEMOGLOBIN: 11.8 g/dL — AB (ref 12.0–15.0)
LYMPHS PCT: 17 %
Lymphs Abs: 1.5 10*3/uL (ref 0.7–4.0)
MCH: 24.6 pg — AB (ref 26.0–34.0)
MCHC: 28.7 g/dL — ABNORMAL LOW (ref 30.0–36.0)
MCV: 85.6 fL (ref 78.0–100.0)
MONOS PCT: 16 %
Monocytes Absolute: 1.4 10*3/uL — ABNORMAL HIGH (ref 0.1–1.0)
NEUTROS ABS: 5.8 10*3/uL (ref 1.7–7.7)
Neutrophils Relative %: 66 %
Platelets: 261 10*3/uL (ref 150–400)
RBC: 4.8 MIL/uL (ref 3.87–5.11)
RDW: 15.2 % (ref 11.5–15.5)
WBC: 8.7 10*3/uL (ref 4.0–10.5)

## 2017-06-11 LAB — COMPREHENSIVE METABOLIC PANEL
ALBUMIN: 3.3 g/dL — AB (ref 3.5–5.0)
ALK PHOS: 81 U/L (ref 38–126)
ALT: 18 U/L (ref 14–54)
AST: 26 U/L (ref 15–41)
Anion gap: 9 (ref 5–15)
BUN: 21 mg/dL — AB (ref 6–20)
CALCIUM: 9.4 mg/dL (ref 8.9–10.3)
CO2: 27 mmol/L (ref 22–32)
CREATININE: 1.47 mg/dL — AB (ref 0.44–1.00)
Chloride: 105 mmol/L (ref 101–111)
GFR calc Af Amer: 40 mL/min — ABNORMAL LOW (ref >60)
GFR calc non Af Amer: 34 mL/min — ABNORMAL LOW (ref >60)
GLUCOSE: 200 mg/dL — AB (ref 65–99)
Potassium: 4.3 mmol/L (ref 3.5–5.1)
SODIUM: 141 mmol/L (ref 135–145)
Total Bilirubin: 1 mg/dL (ref 0.3–1.2)
Total Protein: 7 g/dL (ref 6.5–8.1)

## 2017-06-11 LAB — HEPARIN LEVEL (UNFRACTIONATED)
HEPARIN UNFRACTIONATED: 0.66 [IU]/mL (ref 0.30–0.70)
Heparin Unfractionated: 0.5 IU/mL (ref 0.30–0.70)

## 2017-06-11 LAB — GLUCOSE, CAPILLARY: Glucose-Capillary: 174 mg/dL — ABNORMAL HIGH (ref 65–99)

## 2017-06-11 LAB — PROTIME-INR
INR: 1.21
Prothrombin Time: 15.2 seconds (ref 11.4–15.2)

## 2017-06-11 LAB — TROPONIN I
TROPONIN I: 0.04 ng/mL — AB (ref <0.03)
TROPONIN I: 0.04 ng/mL — AB (ref <0.03)
Troponin I: 0.05 ng/mL (ref <0.03)

## 2017-06-11 LAB — ECHOCARDIOGRAM COMPLETE
HEIGHTINCHES: 66 in
WEIGHTICAEL: 7184 [oz_av]

## 2017-06-11 LAB — CBG MONITORING, ED
Glucose-Capillary: 203 mg/dL — ABNORMAL HIGH (ref 65–99)
Glucose-Capillary: 200 mg/dL — ABNORMAL HIGH (ref 65–99)

## 2017-06-11 LAB — I-STAT TROPONIN, ED: Troponin i, poc: 0.02 ng/mL (ref 0.00–0.08)

## 2017-06-11 LAB — APTT: aPTT: 33 seconds (ref 24–36)

## 2017-06-11 LAB — BRAIN NATRIURETIC PEPTIDE: B Natriuretic Peptide: 109.9 pg/mL — ABNORMAL HIGH (ref 0.0–100.0)

## 2017-06-11 LAB — MAGNESIUM: Magnesium: 1.6 mg/dL — ABNORMAL LOW (ref 1.7–2.4)

## 2017-06-11 LAB — TSH: TSH: 2.348 u[IU]/mL (ref 0.350–4.500)

## 2017-06-11 MED ORDER — METOPROLOL TARTRATE 25 MG PO TABS: 25.0000 mg | ORAL_TABLET | Freq: Once | ORAL | Status: DC

## 2017-06-11 MED ORDER — METOPROLOL TARTRATE 25 MG PO TABS
25.0000 mg | ORAL_TABLET | Freq: Two times a day (BID) | ORAL | Status: DC
  Administered 2017-06-11: 25 mg via ORAL
  Filled 2017-06-11: qty 1

## 2017-06-11 MED ORDER — MAGNESIUM OXIDE 400 (241.3 MG) MG PO TABS
400.0000 mg | ORAL_TABLET | Freq: Once | ORAL | Status: AC
Start: 2017-06-11 — End: 2017-06-11
  Administered 2017-06-11: 400 mg via ORAL
  Filled 2017-06-11: qty 1

## 2017-06-11 MED ORDER — ALBUTEROL SULFATE (2.5 MG/3ML) 0.083% IN NEBU: 3.0000 mL | INHALATION_SOLUTION | RESPIRATORY_TRACT | Status: DC | PRN

## 2017-06-11 MED ORDER — ONDANSETRON HCL 4 MG/2ML IJ SOLN
4.0000 mg | Freq: Four times a day (QID) | INTRAMUSCULAR | Status: DC | PRN
  Administered 2017-06-11 – 2017-06-13 (×2): 4 mg via INTRAVENOUS
  Filled 2017-06-11 (×2): qty 2

## 2017-06-11 MED ORDER — FUROSEMIDE 10 MG/ML IJ SOLN
40.0000 mg | Freq: Two times a day (BID) | INTRAMUSCULAR | Status: DC
  Administered 2017-06-12 – 2017-06-13 (×3): 40 mg via INTRAVENOUS
  Filled 2017-06-11 (×3): qty 4

## 2017-06-11 MED ORDER — FUROSEMIDE 10 MG/ML IJ SOLN
40.0000 mg | Freq: Once | INTRAMUSCULAR | Status: AC
  Administered 2017-06-11: 40 mg via INTRAVENOUS
  Filled 2017-06-11: qty 4

## 2017-06-11 MED ORDER — SENNA 8.6 MG PO TABS
1.0000 | ORAL_TABLET | Freq: Every day | ORAL | Status: DC
  Administered 2017-06-11 – 2017-06-15 (×4): 8.6 mg via ORAL
  Filled 2017-06-11 (×7): qty 1

## 2017-06-11 MED ORDER — INSULIN ASPART 100 UNIT/ML ~~LOC~~ SOLN
0.0000 [IU] | Freq: Three times a day (TID) | SUBCUTANEOUS | Status: DC
  Administered 2017-06-11: 5 [IU] via SUBCUTANEOUS
  Administered 2017-06-12: 2 [IU] via SUBCUTANEOUS
  Administered 2017-06-12: 3 [IU] via SUBCUTANEOUS
  Administered 2017-06-12: 2 [IU] via SUBCUTANEOUS
  Administered 2017-06-13: 3 [IU] via SUBCUTANEOUS
  Administered 2017-06-14: 2 [IU] via SUBCUTANEOUS
  Administered 2017-06-15 – 2017-06-16 (×3): 3 [IU] via SUBCUTANEOUS
  Administered 2017-06-16: 2 [IU] via SUBCUTANEOUS
  Filled 2017-06-11: qty 1

## 2017-06-11 MED ORDER — HEPARIN (PORCINE) IN NACL 100-0.45 UNIT/ML-% IJ SOLN
1400.0000 [IU]/h | INTRAMUSCULAR | Status: DC
  Administered 2017-06-11 – 2017-06-12 (×2): 1400 [IU]/h via INTRAVENOUS
  Filled 2017-06-11 (×5): qty 250

## 2017-06-11 MED ORDER — INSULIN NPH (HUMAN) (ISOPHANE) 100 UNIT/ML ~~LOC~~ SUSP: 40.0000 [IU] | Freq: Two times a day (BID) | SUBCUTANEOUS | Status: DC

## 2017-06-11 MED ORDER — METOPROLOL TARTRATE 25 MG PO TABS: 12.5000 mg | ORAL_TABLET | Freq: Two times a day (BID) | ORAL | Status: DC

## 2017-06-11 MED ORDER — METOPROLOL TARTRATE 25 MG PO TABS: 50.0000 mg | ORAL_TABLET | Freq: Two times a day (BID) | ORAL | Status: DC

## 2017-06-11 MED ORDER — LORAZEPAM 0.5 MG PO TABS
0.5000 mg | ORAL_TABLET | Freq: Once | ORAL | Status: AC
Start: 2017-06-11 — End: 2017-06-11
  Administered 2017-06-11: 0.5 mg via ORAL
  Filled 2017-06-11: qty 1

## 2017-06-11 MED ORDER — ACETAMINOPHEN 325 MG PO TABS
650.0000 mg | ORAL_TABLET | Freq: Four times a day (QID) | ORAL | Status: DC | PRN
  Administered 2017-06-14 – 2017-06-16 (×2): 650 mg via ORAL
  Filled 2017-06-11 (×2): qty 2

## 2017-06-11 MED ORDER — LOSARTAN POTASSIUM 25 MG PO TABS
25.0000 mg | ORAL_TABLET | Freq: Every day | ORAL | Status: DC
  Administered 2017-06-12 – 2017-06-16 (×5): 25 mg via ORAL
  Filled 2017-06-11 (×5): qty 1

## 2017-06-11 MED ORDER — METOPROLOL TARTRATE 50 MG PO TABS
50.0000 mg | ORAL_TABLET | Freq: Two times a day (BID) | ORAL | Status: DC
  Administered 2017-06-11 – 2017-06-12 (×2): 50 mg via ORAL
  Filled 2017-06-11 (×2): qty 1

## 2017-06-11 MED ORDER — WARFARIN - PHARMACIST DOSING INPATIENT
Freq: Every day | Status: DC
  Administered 2017-06-13: 18:00:00

## 2017-06-11 MED ORDER — INSULIN NPH (HUMAN) (ISOPHANE) 100 UNIT/ML ~~LOC~~ SUSP
30.0000 [IU] | Freq: Two times a day (BID) | SUBCUTANEOUS | Status: DC
  Administered 2017-06-11 – 2017-06-12 (×2): 30 [IU] via SUBCUTANEOUS
  Filled 2017-06-11 (×2): qty 10

## 2017-06-11 MED ORDER — ALPRAZOLAM 0.25 MG PO TABS
0.5000 mg | ORAL_TABLET | Freq: Once | ORAL | Status: AC
  Administered 2017-06-11: 0.5 mg via ORAL
  Filled 2017-06-11: qty 2

## 2017-06-11 MED ORDER — METOPROLOL TARTRATE 5 MG/5ML IV SOLN
5.0000 mg | Freq: Once | INTRAVENOUS | Status: AC
  Administered 2017-06-11: 5 mg via INTRAVENOUS
  Filled 2017-06-11: qty 5

## 2017-06-11 MED ORDER — ACETAMINOPHEN 650 MG RE SUPP: 650.0000 mg | Freq: Four times a day (QID) | RECTAL | Status: DC | PRN

## 2017-06-11 MED ORDER — FUROSEMIDE 40 MG PO TABS
40.0000 mg | ORAL_TABLET | Freq: Every day | ORAL | Status: DC
  Administered 2017-06-11: 40 mg via ORAL
  Filled 2017-06-11: qty 2

## 2017-06-11 MED ORDER — HEPARIN BOLUS VIA INFUSION
5000.0000 [IU] | Freq: Once | INTRAVENOUS | Status: AC
  Administered 2017-06-11: 5000 [IU] via INTRAVENOUS
  Filled 2017-06-11: qty 5000

## 2017-06-11 MED ORDER — PERFLUTREN LIPID MICROSPHERE
1.0000 mL | INTRAVENOUS | Status: AC | PRN
  Administered 2017-06-11: 2 mL via INTRAVENOUS
  Filled 2017-06-11: qty 10

## 2017-06-11 MED ORDER — ONDANSETRON HCL 4 MG PO TABS
4.0000 mg | ORAL_TABLET | Freq: Four times a day (QID) | ORAL | Status: DC | PRN
  Administered 2017-06-14: 4 mg via ORAL
  Filled 2017-06-11: qty 1

## 2017-06-11 MED ORDER — MAGNESIUM SULFATE 2 GM/50ML IV SOLN
2.0000 g | Freq: Once | INTRAVENOUS | Status: AC
  Administered 2017-06-11: 2 g via INTRAVENOUS
  Filled 2017-06-11: qty 50

## 2017-06-11 MED ORDER — FLUTICASONE PROPIONATE 50 MCG/ACT NA SUSP
2.0000 | Freq: Every day | NASAL | Status: DC
  Administered 2017-06-11 – 2017-06-16 (×5): 2 via NASAL
  Filled 2017-06-11 (×2): qty 16

## 2017-06-11 MED ORDER — ZOLPIDEM TARTRATE 5 MG PO TABS
5.0000 mg | ORAL_TABLET | Freq: Once | ORAL | Status: AC
Start: 2017-06-11 — End: 2017-06-11
  Administered 2017-06-11: 5 mg via ORAL
  Filled 2017-06-11: qty 1

## 2017-06-11 MED ORDER — LOSARTAN POTASSIUM 50 MG PO TABS
50.0000 mg | ORAL_TABLET | Freq: Every day | ORAL | Status: DC
  Administered 2017-06-11: 50 mg via ORAL
  Filled 2017-06-11 (×2): qty 1

## 2017-06-11 MED ORDER — WARFARIN SODIUM 7.5 MG PO TABS
7.5000 mg | ORAL_TABLET | Freq: Once | ORAL | Status: DC
  Filled 2017-06-11: qty 1

## 2017-06-11 NOTE — Care Management Note (Signed)
Case Management Note  Patient Details  Name: Jodi Underwood MRN: [id number] Date of Birth: 05-18-1946  Subjective/Objective:                  71 y.o. female with medical history significant of DM type II, asthma, morbid obesity, lymphedema; who presents with complaints of of progressively worsening shortness of breath over the last 3 weeks. From home alone.  Action/Plan: Admit status OBSERVATION (AFIB WITH RVR); anticipate discharge [place].   Expected Discharge Date:  (unknown)               Expected Discharge Plan:  [place]  In-House Referral:  Clinical Social Work  Discharge planning Services  CM Consult  Status of Service:  In process, will continue to follow  If discussed at Long Length of Stay Meetings, dates discussed:    Additional Comments:  Fuller Mandril, RN 06/11/2017, 10:02 AM

## 2017-06-11 NOTE — ED Notes (Signed)
Purewick placed at this time for patient comfort.  Family at the bedside.  Encouraged to call for assistance as needed.

## 2017-06-11 NOTE — ED Notes (Signed)
Placed pt on 3L of O2.

## 2017-06-11 NOTE — H&P (Addendum)
History and Physical    Jodi Underwood VOJ:[id number] DOB: 08-01-1945 DOA: 06/10/2017  Referring MD/NP/PA: Dr. Joseph Berkshire PCP: Jodi Spanish, MD  Patient coming from:`  Chief Complaint:  Shortness of breath have personally briefly reviewed patient's old medical records in [place]   HPI: Jodi Underwood is a 71 y.o. female with medical history significant of DM type II, asthma, morbid obesity, lymphedema; who presents with complaints of of progressively worsening shortness of breath over the last 3 weeks. At baseline patient lives alone and notes that she is normally ambulatory.she complains of noticing worsening lower extremity edema despite taken her diuretics. She is not normally on oxygen but noted her oxygen saturations also were dipping as low as 73% on room air at home. She felt as though she were able to change position shortness of breath symptoms seem to worsen. He complained of associated symptoms of intermittent chest discomfort, generalized weakness, and decreased appetite. The last day she has not been able to get out of her recliner due to symptoms.  Sometime in 2014-2015 followed in the evening [place] patient went into the hospital reports going into atrial fibrillation due to frequent breathing treatments from her history of asthma. She also admits to poor compliance with diabetic diet and does not routinely check her blood glucose levels. Upon EMS arrival patient was noted to be in atrial fibrillation with heart rates 140 to 170s. Given 5 mg of metoprolol IV in route. previously   ED Course: Upon arrival to the emergency department patient was seen to be afebrile with heart rates 88-120, respirations 19-25, blood pressure 123/99 to 146/110, and O2 saturations maintained on room air. Labs revealed WBC 8.7, hemoglobin 11.8, BUN 21, creatinine 1.47, BNP 109.9, and initial troponin i 0.02. Chest x-ray showed cardiomegaly without overt signs of pulmonary  edema. Patient was started on heparin drip given risk factors, given additional 5 mg of metoprolol IV, and TRH called to admit.  Review of Systems  Constitutional: Positive for malaise/fatigue. Negative for chills and fever.  HENT: Negative for ear discharge and nosebleeds.   Eyes: Negative for photophobia and pain.  Respiratory: Positive for shortness of breath.   Cardiovascular: Positive for chest pain and leg swelling. Negative for palpitations.  Gastrointestinal: Positive for constipation and nausea. Negative for abdominal pain.  Genitourinary: Negative for dysuria and frequency.  Musculoskeletal: Positive for joint pain and myalgias.  Skin: Negative for itching and rash.  Neurological: Positive for weakness. Negative for speech change and focal weakness.  Psychiatric/Behavioral: Negative for substance abuse. The patient is nervous/anxious.     Past Medical History:  Diagnosis Date  . Anemia   . Arthritis   . Asthma   . Degenerative disc disease   . Diabetes mellitus   . Diabetic neuropathy ([place])   . Hypertension   . Lymphedema   . Pneumonia   . Urinary incontinence     Past Surgical History:  Procedure Laterality Date  . ABDOMINAL HYSTERECTOMY     COMPLETE     reports that  has never smoked. she has never used smokeless tobacco. She reports that she drinks alcohol. She reports that she does not use drugs.  No Known Allergies  Family History  Adopted: Yes    Prior to Admission medications   Medication Sig Start Date End Date Taking? Authorizing Provider  albuterol (PROVENTIL HFA;VENTOLIN HFA) 108 (90 Base) MCG/ACT inhaler Inhale 2 puffs into the lungs every 6 (six) hours as needed. 10/20/16  Yes Patrecia Pour,  Christean Grief, MD  DiphenhydrAMINE HCl, Sleep, (ZZZQUIL) 25 MG CAPS Take 50 mg by mouth at bedtime as needed (sleep).   Yes [provider]  fluticasone (FLONASE) 50 MCG/ACT nasal spray Place 2 sprays into both nostrils daily. 10/20/16  Yes Patrecia Pour, Christean Grief,  MD  furosemide (LASIX) 40 MG tablet Take 40 mg by mouth daily. 05/22/17  Yes [provider]  losartan (COZAAR) 50 MG tablet Take 50 mg by mouth daily.  03/28/17  Yes [provider]  naproxen sodium (ALEVE) 220 MG tablet Take 440 mg by mouth as needed.   Yes [provider]  NOVOLIN N RELION 100 UNIT/ML injection Inject 50 Units into the skin 2 (two) times daily. 05/18/17  Yes [provider]  nystatin cream (MYCOSTATIN) Apply 1 application topically as needed. 03/27/17  Yes [provider]  fluticasone (FLOVENT HFA) 220 MCG/ACT inhaler Inhale 2 puffs into the lungs 2 (two) times daily. Patient not taking: Reported on 06/11/2017 10/20/16   Patrecia Pour, Christean Grief, MD  insulin aspart (NOVOLOG) 100 UNIT/ML injection Inject 8 Units into the skin 3 (three) times daily with meals. Patient not taking: Reported on 05/16/2016 05/01/16   [name], MD  ipratropium-albuterol (DUONEB) 0.5-2.5 (3) MG/3ML SOLN Take 3 mLs by nebulization every 6 (six) hours as needed. Patient not taking: Reported on 06/11/2017 10/20/16   Patrecia Pour, Christean Grief, MD  oxyCODONE-acetaminophen (PERCOCET/ROXICET) 5-325 MG tablet Take 2 tablets by mouth every 6 (six) hours as needed for severe pain. Patient not taking: Reported on 06/11/2017 10/20/16   Patrecia Pour, Christean Grief, MD    Physical Exam:  Constitutional: Morbidly obese female who appears uncomfortable Vitals:   06/11/17 0030 06/11/17 0115 06/11/17 0145 06/11/17 0200  BP: (!) 146/110 (!) 145/112  (!) 141/92  Pulse: (!) 116 (!) 120 96   Resp: (!) 25 19 (!) 24 (!) 23  Temp:      TempSrc:      SpO2: 97% 96% 100%   Weight:      Height:       Eyes: PERRL, lids and conjunctivae normal ENMT: Mucous membranes are moist. Posterior pharynx clear of any exudate or lesions.  Neck: Increased circumference ofneck, supple, no masses, no thyromegaly Respiratory: Decreased overall aeration without significant wheezes appreciated or crackles. Patient  able to talk in full sentences at this time. Cardiovascular: Irregularly irregular  no murmurs / rubs / gallops. Lymphedema noted of the bilateral lower extremities. 2+ pedal pulses. No carotid bruits.  Abdomen: no tenderness, no masses palpated. No hepatosplenomegaly. Bowel sounds positive.  Musculoskeletal: Decreased range of extremities due to body habitus Skin: Mild erythema noted of the sacrum but no skin breakdown appreciated. Neurologic: CN 2-12 grossly intact. Sensation intact, DTR normal. Strength 5/5 in all 4.  Psychiatric: Normal judgment and insight. Alert and oriented x 3. Normal mood.     Labs on Admission: I have personally reviewed following labs and imaging studies  CBC: Recent Labs  Lab 06/11/17 0037  WBC 8.7  NEUTROABS 5.8  HGB 11.8*  HCT 41.1  MCV 85.6  PLT 341   Basic Metabolic Panel: Recent Labs  Lab 06/11/17 0037  NA 141  K 4.3  CL 105  CO2 27  GLUCOSE 200*  BUN 21*  CREATININE 1.47*  CALCIUM 9.4   GFR: Estimated Creatinine Clearance: 63.9 mL/min (A) (by C-G formula based on SCr of 1.47 mg/dL (H)). Liver Function Tests: Recent Labs  Lab 06/11/17 0037  AST 26  ALT 18  ALKPHOS 81  BILITOT 1.0  PROT 7.0  ALBUMIN 3.3*   No results for input(s): LIPASE, AMYLASE in the last 168 hours. No results for input(s): AMMONIA in the last 168 hours. Coagulation Profile: Recent Labs  Lab 06/11/17 0037  INR 1.21   Cardiac Enzymes: No results for input(s): CKTOTAL, CKMB, CKMBINDEX, TROPONINI in the last 168 hours. BNP (last 3 results) No results for input(s): PROBNP in the last 8760 hours. HbA1C: No results for input(s): HGBA1C in the last 72 hours. CBG: No results for input(s): GLUCAP in the last 168 hours. Lipid Profile: No results for input(s): CHOL, HDL, LDLCALC, TRIG, CHOLHDL, LDLDIRECT in the last 72 hours. Thyroid Function Tests: No results for input(s): TSH, T4TOTAL, FREET4, T3FREE, THYROIDAB in the last 72 hours. Anemia Panel: No  results for input(s): VITAMINB12, FOLATE, FERRITIN, TIBC, IRON, RETICCTPCT in the last 72 hours. Urine analysis:    Component Value Date/Time   COLORURINE YELLOW 04/27/2016 2027   APPEARANCEUR CLOUDY (A) 04/27/2016 2027   LABSPEC 1.017 04/27/2016 2027   PHURINE 6.0 04/27/2016 2027   GLUCOSEU NEGATIVE 04/27/2016 2027   HGBUR MODERATE (A) 04/27/2016 2027   BILIRUBINUR NEGATIVE 04/27/2016 2027   KETONESUR NEGATIVE 04/27/2016 2027   PROTEINUR 30 (A) 04/27/2016 2027   UROBILINOGEN 0.2 09/29/2014 1224   NITRITE POSITIVE (A) 04/27/2016 2027   LEUKOCYTESUR MODERATE (A) 04/27/2016 2027   Sepsis Labs: No results found for this or any previous visit (from the past 240 hour(s)).   Radiological Exams on Admission: Dg Chest Port 1 View  Result Date: 06/11/2017 CLINICAL DATA:  Shortness of breath tonight. EXAM: PORTABLE CHEST 1 VIEW COMPARISON:  10/19/2016 FINDINGS: Examination is technically limited due to patient's body habitus. Cardiac enlargement. No vascular congestion. Lungs appear clear and expanded. No blunting of costophrenic angles. No pneumothorax. Unchanged appearance since previous study. IMPRESSION: Cardiac enlargement.  No evidence of active pulmonary disease. Electronically Signed   By: Lucienne Capers M.D.   On: 06/11/2017 01:21    EKG: Independently reviewed. Atrial fibrillation at 133 bpm  Assessment/Plan A. fib with RVR: Acute. Patient notes previous history of A. fib related with frequent albuterol treatments multiple years ago. She presents with heart rates elevated 140-170. Given 5 mg of metoprolol IV with improvement of heart rates. Patient was started on a heparin drip in the ED due to risk factors. CHADsVASc score = at least 4. - Admit to telemetry bed - Check TSH - Check magnesium level - Correct electrolytes as needed - Continue Heparin drip per protocol - Start on metoprolol 12.5 mg bid in a.m. - Check troponin  - Check echocardiogram - Cardiology consulted in ED  and will see in a.m.    Essential hypertension - Continue losartan and furosemide  Elevated BNP: Acute. ProBNP elevated at 109.9 on admission. Patient with signs of cardiomegaly without overt pulmonary edema on chest x-ray. Currently on physical exam maintaining oxygen saturations on room air. - Follow-up echocardiogram - Patient may warrant IV diuresis   Diabetes mellitus type 2: Patient sounds uncontrolled as she reports poor compliance with diet, intermittently stops insulin, and does not check her blood sugar routinely. - Hypoglycemic protocol - Check hemoglobin A1c -  Reduced home hemoglobin from 50 units down to 40 units bid while hospitalized - CBGs every before meals with moderate SSI - Patient would likely benefit from diabetic education  History of asthma: Patient does not appear to show any overt signs of acute asthma exacerbation at this time. - Albuterol as needed   Anemia:  Hemoglobin 11.8 on admission which appears near patient's baseline. - Continue to monitor  Lymphedema: Chronic.  Morbid obesity: BMI 72.5 on admission  DVT prophylaxis: Heparin gtt   Code Status: Full  Family Communication: No family present at bedside  Disposition Plan: TBD Consults called: Cardiology  Admission status: Observation  Norval Morton MD [facility] Pager [phone]   If 7PM-7AM, please contact night-coverage [email or web address] Password [email or web address]  06/11/2017, 2:51 AM

## 2017-06-11 NOTE — Progress Notes (Signed)
ANTICOAGULATION CONSULT NOTE - Follow Up Consult  Pharmacy Consult for Heparin  Indication: atrial fibrillation  No Known Allergies  Patient Measurements: Height: 5\' 6"  (167.6 cm) Weight: (!) 449 lb (203.7 kg) IBW/kg (Calculated) : 59.3  Vital Signs: BP: 133/83 (11/26 1800) Pulse Rate: 106 (11/26 1800)  Labs: Recent Labs    06/11/17 0037 06/11/17 0423 06/11/17 0918 06/11/17 1815  HGB 11.8*  --   --   --   HCT 41.1  --   --   --   PLT 261  --   --   --   APTT 33  --   --   --   LABPROT 15.2  --   --   --   INR 1.21  --   --   --   HEPARINUNFRC  --   --  0.50 0.66  CREATININE 1.47*  --   --   --   TROPONINI  --  0.05* 0.04*  --    Medical History: Past Medical History:  Diagnosis Date  . Anemia   . Arthritis   . Asthma   . Degenerative disc disease   . Diabetes mellitus   . Diabetic neuropathy ([place])   . Hypertension   . Lymphedema   . Pneumonia   . Urinary incontinence    Assessment: 71 y/o presents with atrial fibrillation on coumadin and heparin. Heparin level confirmed at goal on 1400 units/hr   Goal of Therapy:  Heparin level 0.3-0.7 units/ml Monitor platelets by anticoagulation protocol: Yes   Plan:   Continue heparin drip at 1400 units/hr Daily CBC/HL  Hildred Laser, Pharm D 06/11/2017 7:15 PM

## 2017-06-11 NOTE — Progress Notes (Signed)
ANTICOAGULATION CONSULT NOTE - Initial Consult  Pharmacy Consult for Heparin  Indication: atrial fibrillation  No Known Allergies  Patient Measurements: Height: 5\' 6"  (167.6 cm) Weight: (!) 449 lb (203.7 kg) IBW/kg (Calculated) : 59.3  Vital Signs: Temp: 97.7 F (36.5 C) (11/25 2346) Temp Source: Oral (11/25 2346) BP: 146/110 (11/26 0030) Pulse Rate: 116 (11/26 0030)  Labs: Recent Labs    06/11/17 0037  HGB 11.8*  HCT 41.1  PLT 261  APTT 33  LABPROT 15.2  INR 1.21   Medical History: Past Medical History:  Diagnosis Date  . Anemia   . Arthritis   . Asthma   . Degenerative disc disease   . Diabetes mellitus   . Diabetic neuropathy ([place])   . Hypertension   . Lymphedema   . Pneumonia   . Urinary incontinence    Assessment: 71 y/o presents to the ED in atrial fibrillation, starting heparin per pharmacy, Hgb 11.8, baseline INR 1.21, PTA meds reviewed.   Goal of Therapy:  Heparin level 0.3-0.7 units/ml Monitor platelets by anticoagulation protocol: Yes   Plan:  Heparin 5000 units BOLUS Start heparin drip at 1400 units/hr 0900 HL Daily CBC/HL Monitor for bleeding   Narda Bonds 06/11/2017,1:38 AM

## 2017-06-11 NOTE — Progress Notes (Signed)
Patient admitted after midnight, please see H&P.  ON heparin gtt.  Rate 130s.  Given BB Last PM with results of lowering HR- will increase to 25 for now.  ? If needs to be changed to Cardizem.  Defer to cardiology. Echo pending.  Eulogio Bear DO

## 2017-06-11 NOTE — Progress Notes (Signed)
ANTICOAGULATION CONSULT NOTE - Initial Consult  Pharmacy Consult for Coumadin Indication: atrial fibrillation  No Known Allergies  Patient Measurements: Height: 5\' 6"  (167.6 cm) Weight: (!) 449 lb (203.7 kg) IBW/kg (Calculated) : 59.3  Vital Signs: BP: 127/72 (11/26 1414) Pulse Rate: 114 (11/26 1421)  Labs: Recent Labs    06/11/17 0037 06/11/17 0423 06/11/17 0918  HGB 11.8*  --   --   HCT 41.1  --   --   PLT 261  --   --   APTT 33  --   --   LABPROT 15.2  --   --   INR 1.21  --   --   HEPARINUNFRC  --   --  0.50  CREATININE 1.47*  --   --   TROPONINI  --  0.05* 0.04*    Estimated Creatinine Clearance: 63.9 mL/min (A) (by C-G formula based on SCr of 1.47 mg/dL (H)).   Medical History: Past Medical History:  Diagnosis Date  . Anemia   . Arthritis   . Asthma   . Degenerative disc disease   . Diabetes mellitus   . Diabetic neuropathy ([place])   . Hypertension   . Lymphedema   . Pneumonia   . Urinary incontinence     Assessment: 71 year old female on IV heparin for afib with RVR to now start Coumadin bridge. Baseline INR 1.21. LFTs are within normal limits. Hbg 11.8. Platelets within normal limits. No bleeding noted.   Goal of Therapy:  INR 2-3 Monitor platelets by anticoagulation protocol: Yes   Plan:  Continue heparin at current rate.  Start Coumadin 7.5mg  po x1 today.  Daily PT/INR Continue heparin for 5 days or until INR >=2 for 2 days.   Sloan Leiter, PharmD, BCPS, BCCCP Clinical Pharmacist Clinical phone 06/11/2017 until 11PM [phone] After hours, please call #[phone] 06/11/2017,3:16 PM

## 2017-06-11 NOTE — ED Notes (Signed)
[place] notified of lab results.

## 2017-06-11 NOTE — Consult Note (Signed)
Cardiology Consultation:   Patient ID: Jodi Underwood; [id number]; 10-10-1945   Admit date: 06/10/2017 Date of Consult: 06/11/2017  Primary Care Provider: Guadlupe Spanish, MD Primary Cardiologist: No primary care provider on file. NEW Primary Electrophysiologist:  NA*   Patient Profile:   Jodi Underwood is a 71 y.o. female with a hx of DM, HTN, lymphedema and asthma who is being seen today for the evaluation of atrial fib at the request of Dr. Eliseo Squires..  History of Present Illness:   Jodi Underwood presented to ER by EMS for increasing lower ext. edem afor 2 weeks.  She was found to in in A fib with RVR.  Remote hx of a fib.  HR was 140-170.  She was given 5 mg metoprolol IV and HR came down to 100.   Her PMH is DM, HTN, lymphedema and asthma.  Prior echo in 2016 with moderate concentric hypertrophy of the septum.  PA pressure 38 mmHg  LA was severely dilated. She is on IV heparin  BNP was 109, troponin POC 0.02 and troponin 0.05 and 0.04  Cr 1.47, glucose 200, K+ 4.3, Hgb 11.8   CXR 2 V with cardiac enlargement with no pulmonary disease  EKG:  The EKG was personally reviewed and demonstrates:  A fib with poor R wave progression.  Telemetry:  Telemetry was personally reviewed and demonstrates:  Was in SR for awhile today-  If that is her heart bear but now back in a fib with RVR   Currently uncomfortable in bed help has been called to lift her.  No chest pain and has never had cardiac ischemic disease.  Hx of PAF with pulmonary issue in past.   She has SOB over last 2 weeks and increasing edema.  Not aware of her rapid HR.  She did receive lasix 40 IV twice in ER.  She has female external cath.   Past Medical History:  Diagnosis Date  . Anemia   . Arthritis   . Asthma   . Degenerative disc disease   . Diabetes mellitus   . Diabetic neuropathy ([place])   . Hypertension   . Lymphedema   . Pneumonia   . Urinary incontinence     Past Surgical History:  Procedure  Laterality Date  . ABDOMINAL HYSTERECTOMY     COMPLETE     Home Medications:  Prior to Admission medications   Medication Sig Start Date End Date Taking? Authorizing Provider  albuterol (PROVENTIL HFA;VENTOLIN HFA) 108 (90 Base) MCG/ACT inhaler Inhale 2 puffs into the lungs every 6 (six) hours as needed. 10/20/16  Yes Patrecia Pour, Christean Grief, MD  DiphenhydrAMINE HCl, Sleep, (ZZZQUIL) 25 MG CAPS Take 50 mg by mouth at bedtime as needed (sleep).   Yes [provider]  fluticasone (FLONASE) 50 MCG/ACT nasal spray Place 2 sprays into both nostrils daily. 10/20/16  Yes Patrecia Pour, Christean Grief, MD  furosemide (LASIX) 40 MG tablet Take 40 mg by mouth daily. 05/22/17  Yes [provider]  losartan (COZAAR) 50 MG tablet Take 50 mg by mouth daily.  03/28/17  Yes [provider]  naproxen sodium (ALEVE) 220 MG tablet Take 440 mg by mouth as needed.   Yes [provider]  NOVOLIN N RELION 100 UNIT/ML injection Inject 50 Units into the skin 2 (two) times daily. 05/18/17  Yes [provider]  nystatin cream (MYCOSTATIN) Apply 1 application topically as needed. 03/27/17  Yes [provider]  fluticasone (FLOVENT HFA) 220 MCG/ACT inhaler Inhale 2 puffs into  the lungs 2 (two) times daily. Patient not taking: Reported on 06/11/2017 10/20/16   Patrecia Pour, Christean Grief, MD  insulin aspart (NOVOLOG) 100 UNIT/ML injection Inject 8 Units into the skin 3 (three) times daily with meals. Patient not taking: Reported on 05/16/2016 05/01/16   [name], MD  ipratropium-albuterol (DUONEB) 0.5-2.5 (3) MG/3ML SOLN Take 3 mLs by nebulization every 6 (six) hours as needed. Patient not taking: Reported on 06/11/2017 10/20/16   Patrecia Pour, Christean Grief, MD  oxyCODONE-acetaminophen (PERCOCET/ROXICET) 5-325 MG tablet Take 2 tablets by mouth every 6 (six) hours as needed for severe pain. Patient not taking: Reported on 06/11/2017 10/20/16   Doreatha Lew, MD    Inpatient Medications: Scheduled  Meds: . fluticasone  2 spray Each Nare Daily  . insulin aspart  0-15 Units Subcutaneous TID WC  . insulin NPH Human  30 Units Subcutaneous BID AC & HS  . losartan  50 mg Oral Daily  . metoprolol tartrate  25 mg Oral BID  . senna  1 tablet Oral Daily   Continuous Infusions: . heparin 1,400 Units/hr (06/11/17 0057)   PRN Meds: acetaminophen **OR** acetaminophen, albuterol, ondansetron **OR** ondansetron (ZOFRAN) IV, perflutren lipid microspheres (DEFINITY) IV suspension  Allergies:   No Known Allergies  Social History:   Social History   Socioeconomic History  . Marital status: Divorced    Spouse name: Not on file  . Number of children: Not on file  . Years of education: Not on file  . Highest education level: Not on file  Social Needs  . Financial resource strain: Not on file  . Food insecurity - worry: Not on file  . Food insecurity - inability: Not on file  . Transportation needs - medical: Not on file  . Transportation needs - non-medical: Not on file  Occupational History  . Not on file  Tobacco Use  . Smoking status: Never Smoker  . Smokeless tobacco: Never Used  Substance and Sexual Activity  . Alcohol use: Yes    Comment: occassionally  . Drug use: No  . Sexual activity: Not on file  Other Topics Concern  . Not on file  Social History Narrative  . Not on file    Family History:    Family History  Adopted: Yes     ROS:  Please see the history of present illness.  ROS  General:no colds or fevers, no weight changes Skin:no rashes or ulcers HEENT:no blurred vision, no congestion CV:see HPI PUL:see HPI GI:no diarrhea constipation or melena, no indigestion GU:no hematuria, no dysuria MS:no joint pain, no claudication + lymphedema Neuro:no syncope, no lightheadedness Endo:+ diabetes, no thyroid disease      Physical Exam/Data:   Vitals:   06/11/17 1115 06/11/17 1200 06/11/17 1245 06/11/17 1330  BP:  100/80    Pulse: (!) 104 (!) 103 79 (!) 102    Resp: (!) 25 (!) 23 (!) 21 18  Temp:      TempSrc:      SpO2: 94%  98% 98%  Weight:      Height:        Intake/Output Summary (Last 24 hours) at 06/11/2017 1341 Last data filed at 06/11/2017 1052 Gross per 24 hour  Intake 50 ml  Output -  Net 50 ml   Filed Weights   06/10/17 2347  Weight: (!) 449 lb (203.7 kg)   Body mass index is 72.47 kg/m.  General:  Well nourished, well developed, in no acute distress HEENT: normal Lymph: no adenopathy  Neck: no JVD Endocrine:  No thryomegaly Vascular: No carotid bruits; unable to palpate pedal pulses with edema Cardiac:  normal S1, S2; RRR; no murmur gallup rub or click Lungs:  clear to auscultation bilaterally, no wheezing, rhonchi or rales  Abd: obese, soft, nontender, no hepatomegaly  Ext: + edema and lympedema Musculoskeletal:  No deformities, BUE and BLE strength normal and equal Skin: warm and dry  Neuro:  Alert and oriented X 3 MAE follows commands, + facial symmetry, no focal abnormalities noted Psych:  Normal affect    Relevant CV Studies: Echo 06/11/17  Study Conclusions  - Left ventricle: The cavity size was normal. Systolic function was   normal. The estimated ejection fraction was 50%. Hypokinesis of   the anteroseptal, inferior, and inferoseptal myocardium. The   study is not technically sufficient to allow evaluation of LV   diastolic function. - Aortic valve: Transvalvular velocity was within the normal range.   There was no stenosis. There was no regurgitation. - Mitral valve: Transvalvular velocity was within the normal range.   There was no evidence for stenosis. There was mild regurgitation. - Right ventricle: The cavity size was normal. Wall thickness was   normal. Systolic function was normal. - Tricuspid valve: There was no regurgitation. - Pulmonary arteries: Systolic pressure was within the normal   range. PA peak pressure: 18 mm Hg (S).  Impressions:  - Technically difficult study with poor  visualization of the   endocardium and valves.   ECHO 09/27/14  Study Conclusions  - Left ventricle: The cavity size was normal. There was moderate focal basal and moderate concentric hypertrophy of the septum. Systolic function was normal. Although no diagnostic regional wall motion abnormality was identified, this possibility cannot be completely excluded on the basis of this study. - Aortic valve: Trileaflet; mildly thickened leaflets. Valve area (VTI): 1.92 cm^2. Valve area (Vmax): 1.77 cm^2. Valve area (Vmean): 1.89 cm^2. - Mitral valve: Valve area by continuity equation (using LVOT flow): 2.71 cm^2. - Left atrium: The atrium was severely dilated. - Right atrium: The atrium was moderately dilated. - Pulmonary arteries: Systolic pressure was mildly to moderately increased. PA peak pressure: 38 mm Hg (S).   Laboratory Data:  Chemistry Recent Labs  Lab 06/11/17 0037  NA 141  K 4.3  CL 105  CO2 27  GLUCOSE 200*  BUN 21*  CREATININE 1.47*  CALCIUM 9.4  GFRNONAA 34*  GFRAA 40*  ANIONGAP 9    Recent Labs  Lab 06/11/17 0037  PROT 7.0  ALBUMIN 3.3*  AST 26  ALT 18  ALKPHOS 81  BILITOT 1.0   Hematology Recent Labs  Lab 06/11/17 0037  WBC 8.7  RBC 4.80  HGB 11.8*  HCT 41.1  MCV 85.6  MCH 24.6*  MCHC 28.7*  RDW 15.2  PLT 261   Cardiac Enzymes Recent Labs  Lab 06/11/17 0423 06/11/17 0918  TROPONINI 0.05* 0.04*    Recent Labs  Lab 06/11/17 0053  TROPIPOC 0.02    BNP Recent Labs  Lab 06/11/17 0037  BNP 109.9*    DDimer No results for input(s): DDIMER in the last 168 hours.  Radiology/Studies:  Dg Chest Port 1 View  Result Date: 06/11/2017 CLINICAL DATA:  Shortness of breath tonight. EXAM: PORTABLE CHEST 1 VIEW COMPARISON:  10/19/2016 FINDINGS: Examination is technically limited due to patient's body habitus. Cardiac enlargement. No vascular congestion. Lungs appear clear and expanded. No blunting of costophrenic angles. No  pneumothorax. Unchanged appearance since previous study. IMPRESSION: Cardiac  enlargement.  No evidence of active pulmonary disease. Electronically Signed   By: Lucienne Capers M.D.   On: 06/11/2017 01:21    Assessment and Plan:   1. Atrial fib with RVR now in a fib with rates up to 130 at times on lopressor 25 BID.  Will increase CHA2DS2VASc of 4, HTN, DM, age female.  Would add eliquis but currently on Heparin.  Will switch and ask care manager if she will be able to afford.  May need coumadin instead.    2.        HTN on losartan but will decrease losartan and increase the BB  3.        DM -2 per IM  4.        Lymphedema increased due to a fib.   5.        Difficulties ambulating needs help with motorized chair. Size that fits   For questions or updates, please contact [facility] Please consult [email or web address] for contact info under Cardiology/STEMI.   Signed, Cecilie Kicks, NP  06/11/2017 1:41 PM  Attending Note:   The patient was seen and examined.  Agree with assessment and plan as noted above.  Changes made to the above note as needed.  Patient seen and independently examined with Cecilie Kicks, NP .   We discussed all aspects of the encounter. I agree with the assessment and plan as stated above.  1.  Atrial fibrillation: Jodi Underwood presents with atrial fibrillation.  She is basically asymptomatic and cannot tell that her heart rate is regular.  She presented with worsening leg edema.  It is quite possible that her baseline lymphedema is now superimposed on some diastolic dysfunction from her atrial fibrillation.  I think it may be very difficult to maintain sinus rhythm given her morbid obesity and her comorbid problems.  We will start her on Eliquis 5 mg twice a day for anticoagulation.  Continue to titrate up her beta-blocker as tolerated/as needed for rate control.  We can consider cardioversion after she has been on Eliquis for a month.  2.  Increased leg edema.  She has  very severe lymphedema at baseline.  This is been very poorly controlled over the past several yea  She now states that her leg pain has worsened and this may be due to some diastolic dysfunction.  We will continue Lasix 40 mg IV twice a day and see if she diuresis.  She has normal left ventricular systolic function and normal renal function.   I have spent a total of 40 minutes with patient reviewing hospital  notes , telemetry, EKGs, labs and examining patient as well as establishing an assessment and plan that was discussed with the patient. > 50% of time was spent in direct patient care.  3.  Morbid obesity: This he can be a challenge for her.  Would suggest a nutrition consult.  Thayer Headings, Brooke Bonito., MD, [facility] 06/11/2017, 2:58 PM [address],  [place] Pager [phone]

## 2017-06-11 NOTE — ED Notes (Signed)
Attempted to start second IV and draw blood X 2 attempts without success.  [name] to come try.

## 2017-06-11 NOTE — ED Notes (Signed)
Delay in lab draw EMT at bedside assisting with ADLs.

## 2017-06-11 NOTE — Progress Notes (Addendum)
  Echocardiogram 2D Echocardiogram with definity has been performed.  Technically difficult exam due to patient body habitus and poor positioning.   Ladonte Verstraete L Androw 06/11/2017, 12:48 PM

## 2017-06-11 NOTE — ED Notes (Signed)
Admitting physician at the bedside.

## 2017-06-11 NOTE — ED Provider Notes (Signed)
[place] EMERGENCY DEPARTMENT Provider Note   CSN: [id number] Arrival date & time: 06/10/17  2338     History   Chief Complaint Chief Complaint  Patient presents with  . Shortness of Breath    HPI Jodi Underwood is a 71 y.o. female.  Patient presents to the ER for evaluation of shortness of breath.  Patient reports for the last 2 weeks she has noticed increased swelling of her legs.  She reports that she has been having some shortness of breath that has been building up over time as well.  She has not been having any associated chest pain.  Patient does take diuretic, has not had any changes in her dosing.  Brought to the emergency department by ambulance from home.  EMS has identified her as being in atrial fibrillation with a rapid ventricular response.  Patient to minister Lopressor 5 mg IV.  The heart rate went down from the 170s to the 120s, but she did briefly become hypotensive.  No fluids given because of suspected volume overload, blood blood pressure has improved without intervention.      Past Medical History:  Diagnosis Date  . Anemia   . Arthritis   . Asthma   . Degenerative disc disease   . Diabetes mellitus   . Diabetic neuropathy ([place])   . Hypertension   . Lymphedema   . Pneumonia   . Urinary incontinence     Patient Active Problem List   Diagnosis Date Noted  . Cellulitis, leg 10/13/2016  . AKI (acute kidney injury) ([place]) 10/13/2016  . Hypertension   . Lymphedema of both lower extremities   . Abnormal CT of the abdomen   . Pressure injury of skin 04/28/2016  . Intractable lower abdominal pain 04/27/2016  . Constipation   . Respiratory distress   . Acute cystitis without hematuria   . Sepsis ([place]) 09/26/2014  . UTI (urinary tract infection) 09/26/2014  . Asthma 09/26/2014  . Asthma with acute exacerbation   . Blood poisoning   . Urinary tract infectious disease   . Diabetes type 2, uncontrolled ([place])   . Acute on  chronic renal failure ([place])   . Essential hypertension, benign   . Acute renal failure ([place]) 01/25/2013  . Cellulitis of leg, bilateral 01/23/2013  . Ulcers of both lower extremities ([place]) 01/23/2013  . Lymphedema 06/13/2011  . DM type 2, uncontrolled, with neuropathy ([place]) 06/23/2010  . Morbid obesity ([place]) 06/23/2010  . ANEMIA-NOS 06/23/2010  . Essential hypertension 06/23/2010  . Asthma 06/23/2010    Past Surgical History:  Procedure Laterality Date  . ABDOMINAL HYSTERECTOMY     COMPLETE    OB History    No data available       Home Medications    Prior to Admission medications   Medication Sig Start Date End Date Taking? Authorizing Provider  albuterol (PROVENTIL HFA;VENTOLIN HFA) 108 (90 Base) MCG/ACT inhaler Inhale 2 puffs into the lungs every 6 (six) hours as needed. 10/20/16  Yes Patrecia Pour, Christean Grief, MD  DiphenhydrAMINE HCl, Sleep, (ZZZQUIL) 25 MG CAPS Take 50 mg by mouth at bedtime as needed (sleep).   Yes [provider]  fluticasone (FLONASE) 50 MCG/ACT nasal spray Place 2 sprays into both nostrils daily. 10/20/16  Yes Patrecia Pour, Christean Grief, MD  furosemide (LASIX) 40 MG tablet Take 40 mg by mouth daily. 05/22/17  Yes [provider]  losartan (COZAAR) 50 MG tablet Take 50 mg by mouth daily.  03/28/17  Yes  [provider]  naproxen sodium (ALEVE) 220 MG tablet Take 440 mg by mouth as needed.   Yes [provider]  NOVOLIN N RELION 100 UNIT/ML injection Inject 50 Units into the skin 2 (two) times daily. 05/18/17  Yes [provider]  nystatin cream (MYCOSTATIN) Apply 1 application topically as needed. 03/27/17  Yes [provider]  fluticasone (FLOVENT HFA) 220 MCG/ACT inhaler Inhale 2 puffs into the lungs 2 (two) times daily. Patient not taking: Reported on 06/11/2017 10/20/16   Patrecia Pour, Christean Grief, MD  insulin aspart (NOVOLOG) 100 UNIT/ML injection Inject 8 Units into the skin 3 (three) times daily with meals. Patient not  taking: Reported on 05/16/2016 05/01/16   [name], MD  ipratropium-albuterol (DUONEB) 0.5-2.5 (3) MG/3ML SOLN Take 3 mLs by nebulization every 6 (six) hours as needed. Patient not taking: Reported on 06/11/2017 10/20/16   Patrecia Pour, Christean Grief, MD  oxyCODONE-acetaminophen (PERCOCET/ROXICET) 5-325 MG tablet Take 2 tablets by mouth every 6 (six) hours as needed for severe pain. Patient not taking: Reported on 06/11/2017 10/20/16   Doreatha Lew, MD    Family History Family History  Adopted: Yes    Social History Social History   Tobacco Use  . Smoking status: Never Smoker  . Smokeless tobacco: Never Used  Substance Use Topics  . Alcohol use: Yes    Comment: occassionally  . Drug use: No     Allergies   Patient has no known allergies.   Review of Systems Review of Systems  Respiratory: Positive for shortness of breath.   Cardiovascular: Positive for chest pain.  All other systems reviewed and are negative.    Physical Exam Updated Vital Signs BP (!) 141/92   Pulse 96   Temp 97.7 F (36.5 C) (Oral)   Resp (!) 23   Ht 5\' 6"  (1.676 m)   Wt (!) 203.7 kg (449 lb)   SpO2 100%   BMI 72.47 kg/m   Physical Exam  Constitutional: She is oriented to person, place, and time. She appears well-developed and well-nourished. No distress.  HENT:  Head: Normocephalic and atraumatic.  Right Ear: Hearing normal.  Left Ear: Hearing normal.  Nose: Nose normal.  Mouth/Throat: Oropharynx is clear and moist and mucous membranes are normal.  Eyes: Conjunctivae and EOM are normal. Pupils are equal, round, and reactive to light.  Neck: Normal range of motion. Neck supple.  Cardiovascular: S1 normal and S2 normal. An irregularly irregular rhythm present. Tachycardia present. Exam reveals no gallop and no friction rub.  No murmur heard. Pulmonary/Chest: Effort normal. No respiratory distress. She has rales. She exhibits no tenderness.  Abdominal: Soft. Normal appearance and bowel  sounds are normal. There is no hepatosplenomegaly. There is no tenderness. There is no rebound, no guarding, no tenderness at McBurney's point and negative Murphy's sign. No hernia.  Musculoskeletal: Normal range of motion.       Right lower leg: She exhibits edema.       Left lower leg: She exhibits edema.  Neurological: She is alert and oriented to person, place, and time. She has normal strength. No cranial nerve deficit or sensory deficit. Coordination normal. GCS eye subscore is 4. GCS verbal subscore is 5. GCS motor subscore is 6.  Skin: Skin is warm, dry and intact. No rash noted. No cyanosis.  Psychiatric: She has a normal mood and affect. Her speech is normal and behavior is normal. Thought content normal.  Nursing note and vitals reviewed.    ED Treatments /  Results  Labs (all labs ordered are listed, but only abnormal results are displayed) Labs Reviewed  CBC WITH DIFFERENTIAL/PLATELET - Abnormal; Notable for the following components:      Result Value   Hemoglobin 11.8 (*)    MCH 24.6 (*)    MCHC 28.7 (*)    Monocytes Absolute 1.4 (*)    All other components within normal limits  COMPREHENSIVE METABOLIC PANEL - Abnormal; Notable for the following components:   Glucose, Bld 200 (*)    BUN 21 (*)    Creatinine, Ser 1.47 (*)    Albumin 3.3 (*)    GFR calc non Af Amer 34 (*)    GFR calc Af Amer 40 (*)    All other components within normal limits  BRAIN NATRIURETIC PEPTIDE - Abnormal; Notable for the following components:   B Natriuretic Peptide 109.9 (*)    All other components within normal limits  PROTIME-INR  APTT  HEPARIN LEVEL (UNFRACTIONATED)  I-STAT TROPONIN, ED    EKG  EKG Interpretation  Date/Time:  Sunday June 10 2017 23:38:42 EST Ventricular Rate:  133 PR Interval:    QRS Duration: 94 QT Interval:  313 QTC Calculation: 466 R Axis:   10 Text Interpretation:  Atrial fibrillation Probable anterior infarct, old Abnormal T, consider ischemia, lateral  leads Baseline wander in lead(s) V2 When compared with ECG of 10/13/2016, Atrial fibrillation has replaced Sinus tachycardia Confirmed by Delora Fuel ([id number]) on 06/11/2017 12:48:36 AM       Radiology Dg Chest Port 1 View  Result Date: 06/11/2017 CLINICAL DATA:  Shortness of breath tonight. EXAM: PORTABLE CHEST 1 VIEW COMPARISON:  10/19/2016 FINDINGS: Examination is technically limited due to patient's body habitus. Cardiac enlargement. No vascular congestion. Lungs appear clear and expanded. No blunting of costophrenic angles. No pneumothorax. Unchanged appearance since previous study. IMPRESSION: Cardiac enlargement.  No evidence of active pulmonary disease. Electronically Signed   By: Lucienne Capers M.D.   On: 06/11/2017 01:21    Procedures Procedures (including critical care time)  Medications Ordered in ED Medications  heparin ADULT infusion 100 units/mL (25000 units/243mL sodium chloride 0.45%) (1,400 Units/hr Intravenous New Bag/Given 06/11/17 0057)  metoprolol tartrate (LOPRESSOR) injection 5 mg (not administered)  heparin bolus via infusion 5,000 Units (5,000 Units Intravenous Bolus from Bag 06/11/17 0058)     Initial Impression / Assessment and Plan / ED Course  I have reviewed the triage vital signs and the nursing notes.  Pertinent labs & imaging results that were available during my care of the patient were reviewed by me and considered in my medical decision making (see chart for details).     She presents to the emergency department for evaluation of increased leg swelling with shortness of breath.  Patient has a history of lymphedema and morbid obesity, but she does not have a known history of congestive heart failure.  Her main problem is asthma.  I do not hear any wheezing or bronchospasm currently.  Patient does report a history of atrial fibrillation after receiving continuous nebulizer treatment several years ago, but no recurrence of A. fib.  She was, however,  found to be in A. fib with a rapid ventricular response tonight by EMS.  She was treated with Lopressor with good response of her heart rate.  At arrival, CHF workup was initiated before she was administered an additional rate control.  Chest x-ray does show a large heart but there is no obvious pulmonary edema or active congestive heart failure.  Her BNP is 109.  I do not feel she has a decompensated heart failure at this time and therefore will administer additional Lopressor.  Patient started on heparinization because she is asymptomatic from a rapid heart rate standpoint, it is unclear when this A. fib began.  Patient discussed with cardiology, Dr. Elson Areas.  Agrees with current treatment plan, recommends hospitalist admission and cardiology will consult.   CHA2DS2-VASc Score for Atrial Fibrillation Stroke Risk from [email or web address]  on 06/11/2017 ** All calculations should be rechecked by clinician prior to use **  RESULT SUMMARY: 4 points Stroke risk was 4.8% per year in >90,000 patients (the [place] Atrial Fibrillation Cohort Study) and 6.7% risk of stroke/TIA/systemic embolism.  One recommendation suggests a 0 score is "low" risk and may not require anticoagulation; a 1 score is "low-moderate" risk and should consider antiplatelet or anticoagulation, and score 2 or greater is "moderate-high" risk and should otherwise be an anticoagulation candidate.   INPUTS: Age -> 1 = 65-74 Sex -> 1 = Female <abbr title='Congestive heart failure'>CHF</abbr> history -> 0 = No Hypertension history -> 1 = Yes Stroke / TIA / Thromboembolism history -> 0 = No Vascular disease history -> 0 = No Diabetes history -> 1 = Yes   CRITICAL CARE Performed by: Orpah Greek.   Total critical care time: 30 minutes  Critical care time was exclusive of separately billable procedures and treating other patients.  Critical care was necessary to treat or prevent imminent or life-threatening  deterioration.  Critical care was time spent personally by me on the following activities: development of treatment plan with patient and/or surrogate as well as nursing, discussions with consultants, evaluation of patient's response to treatment, examination of patient, obtaining history from patient or surrogate, ordering and performing treatments and interventions, ordering and review of laboratory studies, ordering and review of radiographic studies, pulse oximetry and re-evaluation of patient's condition.   Final Clinical Impressions(s) / ED Diagnoses   Final diagnoses:  Atrial fibrillation with RVR [facility])  Lymphedema    ED Discharge Orders    None       Orpah Greek, MD 06/11/17 [phone]

## 2017-06-11 NOTE — Progress Notes (Signed)
ANTICOAGULATION CONSULT NOTE - Follow Up Consult  Pharmacy Consult for Heparin  Indication: atrial fibrillation  No Known Allergies  Patient Measurements: Height: 5\' 6"  (167.6 cm) Weight: (!) 449 lb (203.7 kg) IBW/kg (Calculated) : 59.3  Vital Signs: Temp: 97.7 F (36.5 C) (11/25 2346) Temp Source: Oral (11/25 2346) BP: 159/117 (11/26 0730) Pulse Rate: 111 (11/26 0730)  Labs: Recent Labs    06/11/17 0037 06/11/17 0423 06/11/17 0918  HGB 11.8*  --   --   HCT 41.1  --   --   PLT 261  --   --   APTT 33  --   --   LABPROT 15.2  --   --   INR 1.21  --   --   HEPARINUNFRC  --   --  0.50  CREATININE 1.47*  --   --   TROPONINI  --  0.05*  --    Medical History: Past Medical History:  Diagnosis Date  . Anemia   . Arthritis   . Asthma   . Degenerative disc disease   . Diabetes mellitus   . Diabetic neuropathy ([place])   . Hypertension   . Lymphedema   . Pneumonia   . Urinary incontinence    Assessment: 71 y/o presents to the ED in atrial fibrillation. Currently on IV heparin @ 1400 units/hr. Initial HL is therapeutic at 0.5. Hgb 15.2, baseline INR 1.21, PTA meds reviewed.   Goal of Therapy:  Heparin level 0.3-0.7 units/ml Monitor platelets by anticoagulation protocol: Yes   Plan:  Continue heparin drip at 1400 units/hr F/u 8 hr confirmatory HL Daily CBC/HL Monitor for bleeding  Albertina Parr, PharmD., BCPS Clinical Pharmacist Pager [phone]

## 2017-06-11 NOTE — Clinical Social Work Note (Signed)
Clinical Social Work Assessment  Patient Details  Name: Jodi Underwood MRN: [id number] Date of Birth: 11-29-1945  Date of referral:  06/11/17               Reason for consult:  Other (Comment Required)(lives alone and has problems with mobility. )                Permission sought to share information with:  Case Manager Permission granted to share information::  Yes, Verbal Permission Granted  Name::     [place]::     Relationship::     Contact Information:     Housing/Transportation Living arrangements for the past 2 months:  Single Family Home(alone since 2009. ) Source of Information:  Patient Patient Interpreter Needed:  None Criminal Activity/Legal Involvement Pertinent to Current Situation/Hospitalization:  No - Comment as needed Significant Relationships:  Adult Children, Neighbor Lives with:  Self Do you feel safe going back to the place where you live?  Yes Need for family participation in patient care:  Yes (Comment)  Care giving concerns:  CSW spoke with pt at bedside. At this time pt expressed concerns about not being able to be as mobile as pt once was due to not having an appropriate power wheel chair that will allow pt to do the things that pt once did.   Social Worker assessment / plan:  CSW spoke with pt at bedside. During this time CSW was informed that pt is from home alone where pt has support from children as well as pt's neighbor. Pt also informed CSW that pt is not interested in SNF placement at this time. Pt appeared to be concerns about not being able to be as mobile due to not being able to ft into wheelchair at home which would allow for pt to be more active.   Employment status:  Retired Forensic scientist:  Medicare PT Recommendations:  Not assessed at this time Information / Referral to community resources:     Patient/Family's Response to care: Pt appeared to be understanding ang agreeable to plan of care at this time.    Patient/Family's Understanding of and Emotional Response to Diagnosis, Current Treatment, and Prognosis:  No further questions or concerns have been presented to CSW at this time.   Emotional Assessment Appearance:  Appears stated age Attitude/Demeanor/Rapport:    Affect (typically observed):  Pleasant, Hopeful Orientation:  Oriented to Self, Oriented to Situation, Oriented to Place, Oriented to  Time Alcohol / Substance use:  Not Applicable Psych involvement (Current and /or in the community):  No (Comment)  Discharge Needs  Concerns to be addressed:  Other (Comment Required(mobility. ) Readmission within the last 30 days:  No Current discharge risk:  None Barriers to Discharge:  No Barriers Identified   Wetzel Bjornstad, [place] 06/11/2017, 7:38 AM

## 2017-06-12 ENCOUNTER — Other Ambulatory Visit: Payer: Self-pay

## 2017-06-12 ENCOUNTER — Encounter (HOSPITAL_COMMUNITY): Payer: Self-pay | Admitting: General Practice

## 2017-06-12 LAB — GLUCOSE, CAPILLARY
GLUCOSE-CAPILLARY: 141 mg/dL — AB (ref 65–99)
GLUCOSE-CAPILLARY: 140 mg/dL — AB (ref 65–99)
Glucose-Capillary: 186 mg/dL — ABNORMAL HIGH (ref 65–99)
Glucose-Capillary: 102 mg/dL — ABNORMAL HIGH (ref 65–99)

## 2017-06-12 LAB — BASIC METABOLIC PANEL
Anion gap: 7 (ref 5–15)
BUN: 24 mg/dL — AB (ref 6–20)
CHLORIDE: 102 mmol/L (ref 101–111)
CO2: 30 mmol/L (ref 22–32)
Calcium: 8.8 mg/dL — ABNORMAL LOW (ref 8.9–10.3)
Creatinine, Ser: 1.65 mg/dL — ABNORMAL HIGH (ref 0.44–1.00)
GFR calc Af Amer: 35 mL/min — ABNORMAL LOW (ref >60)
GFR calc non Af Amer: 30 mL/min — ABNORMAL LOW (ref >60)
GLUCOSE: 154 mg/dL — AB (ref 65–99)
POTASSIUM: 4.3 mmol/L (ref 3.5–5.1)
Sodium: 139 mmol/L (ref 135–145)

## 2017-06-12 LAB — CBC
HEMATOCRIT: 41.6 % (ref 36.0–46.0)
Hemoglobin: 11.4 g/dL — ABNORMAL LOW (ref 12.0–15.0)
MCH: 24.4 pg — AB (ref 26.0–34.0)
MCHC: 27.4 g/dL — AB (ref 30.0–36.0)
MCV: 89.1 fL (ref 78.0–100.0)
Platelets: 291 10*3/uL (ref 150–400)
RBC: 4.67 MIL/uL (ref 3.87–5.11)
RDW: 15.5 % (ref 11.5–15.5)
WBC: 8.1 10*3/uL (ref 4.0–10.5)

## 2017-06-12 LAB — HEMOGLOBIN A1C
Hgb A1c MFr Bld: 7.4 % — ABNORMAL HIGH (ref 4.8–5.6)
MEAN PLASMA GLUCOSE: 166 mg/dL

## 2017-06-12 LAB — HEPARIN LEVEL (UNFRACTIONATED)
HEPARIN UNFRACTIONATED: 0.8 [IU]/mL — AB (ref 0.30–0.70)
HEPARIN UNFRACTIONATED: 0.75 [IU]/mL — AB (ref 0.30–0.70)

## 2017-06-12 LAB — PROTIME-INR
INR: 1.27
Prothrombin Time: 15.8 seconds — ABNORMAL HIGH (ref 11.4–15.2)

## 2017-06-12 MED ORDER — WARFARIN SODIUM 7.5 MG PO TABS: 7.5000 mg | ORAL_TABLET | Freq: Once | ORAL | Status: DC

## 2017-06-12 MED ORDER — CAMPHOR-MENTHOL 0.5-0.5 % EX LOTN
TOPICAL_LOTION | CUTANEOUS | Status: DC | PRN
  Administered 2017-06-12 – 2017-06-13 (×2): via TOPICAL
  Administered 2017-06-14: 1 via TOPICAL
  Administered 2017-06-15: 09:00:00 via TOPICAL
  Filled 2017-06-12: qty 222

## 2017-06-12 MED ORDER — INSULIN NPH (HUMAN) (ISOPHANE) 100 UNIT/ML ~~LOC~~ SUSP
40.0000 [IU] | Freq: Two times a day (BID) | SUBCUTANEOUS | Status: DC
  Filled 2017-06-12 (×2): qty 10

## 2017-06-12 MED ORDER — WARFARIN VIDEO
Freq: Once | Status: AC
  Administered 2017-06-12: 18:00:00

## 2017-06-12 MED ORDER — COUMADIN BOOK
Freq: Once | Status: DC
  Filled 2017-06-12 (×2): qty 1

## 2017-06-12 MED ORDER — WARFARIN SODIUM 7.5 MG PO TABS
7.5000 mg | ORAL_TABLET | Freq: Once | ORAL | Status: AC
  Administered 2017-06-12: 7.5 mg via ORAL
  Filled 2017-06-12: qty 1

## 2017-06-12 MED ORDER — METOPROLOL TARTRATE 25 MG PO TABS
25.0000 mg | ORAL_TABLET | Freq: Once | ORAL | Status: AC
  Administered 2017-06-12: 25 mg via ORAL
  Filled 2017-06-12: qty 1

## 2017-06-12 MED ORDER — METOPROLOL TARTRATE 50 MG PO TABS: 75.0000 mg | ORAL_TABLET | Freq: Two times a day (BID) | ORAL | Status: DC

## 2017-06-12 MED ORDER — HEPARIN (PORCINE) IN NACL 100-0.45 UNIT/ML-% IJ SOLN
1250.0000 [IU]/h | INTRAMUSCULAR | Status: DC
  Administered 2017-06-13 (×2): 1150 [IU]/h via INTRAVENOUS
  Administered 2017-06-14: 1400 [IU]/h via INTRAVENOUS
  Administered 2017-06-14: 1150 [IU]/h via INTRAVENOUS
  Administered 2017-06-15: 1400 [IU]/h via INTRAVENOUS
  Filled 2017-06-12 (×5): qty 250

## 2017-06-12 MED ORDER — METOPROLOL TARTRATE 100 MG PO TABS
100.0000 mg | ORAL_TABLET | Freq: Two times a day (BID) | ORAL | Status: DC
  Administered 2017-06-12 – 2017-06-13 (×2): 100 mg via ORAL
  Filled 2017-06-12 (×2): qty 1

## 2017-06-12 NOTE — Evaluation (Signed)
Occupational Therapy Evaluation Patient Details Name: Jodi Underwood MRN: [id number] DOB: Dec 27, 1945 Today's Date: 06/12/2017    History of Present Illness Patient is a 71 y/o female who presents with edema BLEs and SOB. Found to be in A-fib with RVR. PMH includes morbid obesity, lymphedema, A-fib, HTN, DM, asthma, neuropathy, UI.   Clinical Impression   Pt lives alone with assist of her neighbors and a paid caregiver who assist with LB ADL. Pt transfers to her motorized w/c and is otherwise independent in self care. Pt presents with significant pain in B LEs limiting tolerance of bed mobility and handling. Pt was not able to achieve EOB. She requires total to max assist with bathing and dressing. Pt will need post acute rehab in SNF.   Follow Up Recommendations  SNF;Supervision/Assistance - 24 hour    Equipment Recommendations       Recommendations for Other Services       Precautions / Restrictions Precautions Precautions: Fall Restrictions Weight Bearing Restrictions: No      Mobility Bed Mobility Overal bed mobility: Needs Assistance             General bed mobility comments: Attempted to assist pt to sit EOB, unable to tolerate due to B LE pain. +2 total assist to pull up in bed and position LEs on pillows.  Transfers                 General transfer comment: unable    Balance                                           ADL either performed or assessed with clinical judgement   ADL Overall ADL's : Needs assistance/impaired Eating/Feeding: Independent;Bed level   Grooming: Set up;Wash/dry hands;Wash/dry face;Bed level   Upper Body Bathing: Total assistance;Bed level   Lower Body Bathing: Maximal assistance;Bed level   Upper Body Dressing : Total assistance;Bed level   Lower Body Dressing: Total assistance;Bed level                 General ADL Comments: Pt reports she has not walked in over a year.     Vision  Patient Visual Report: No change from baseline       Perception     Praxis      Pertinent Vitals/Pain Pain Assessment: Faces Faces Pain Scale: Hurts whole lot Pain Location: B LEs Pain Descriptors / Indicators: Crying;Moaning;Grimacing;Guarding Pain Intervention(s): Monitored during session;Repositioned;Limited activity within patient's tolerance     Hand Dominance Right   Extremity/Trunk Assessment Upper Extremity Assessment Upper Extremity Assessment: Overall WFL for tasks assessed(4/5)   Lower Extremity Assessment Lower Extremity Assessment: Defer to PT evaluation       Communication Communication Communication: No difficulties   Cognition Arousal/Alertness: Awake/alert Behavior During Therapy: WFL for tasks assessed/performed Overall Cognitive Status: Impaired/Different from baseline Area of Impairment: Problem solving;Safety/judgement                         Safety/Judgement: Decreased awareness of safety;Decreased awareness of deficits   Problem Solving: Slow processing General Comments: Pt believes she can manage at home despite limited ability to participate in session.   General Comments       Exercises     Shoulder Instructions      Home Living Family/patient expects to be discharged to:: Private residence Living Arrangements:  Alone Available Help at Discharge: Neighbor;Available PRN/intermittently;Personal care attendant(aide comes twice a week to wash her feet) Type of Home: House Home Access: Ramped entrance     Home Layout: One level     Bathroom Shower/Tub: Teacher, early years/pre: Handicapped height     Home Equipment: Wheelchair - power;Bedside commode;Walker - 2 wheels;Walker - 4 wheels;Cane - single point;Transport chair          Prior Functioning/Environment Level of Independence: Needs assistance  Gait / Transfers Assistance Needed: use motorized, w/c for mobility, performs stand pivot transfers  independently ADL's / Homemaking Assistance Needed: orders groceries, neighbors help with lymphedema sleeves, aide helps with washing feet            OT Problem List: Decreased strength;Decreased activity tolerance;Impaired balance (sitting and/or standing);Obesity;Pain;Increased edema      OT Treatment/Interventions: Self-care/ADL training;Therapeutic activities;Patient/family education;Balance training;Therapeutic exercise    OT Goals(Current goals can be found in the care plan section) Acute Rehab OT Goals Patient Stated Goal: to return home OT Goal Formulation: With patient Time For Goal Achievement: 06/19/17 Potential to Achieve Goals: Fair ADL Goals Pt Will Perform Grooming: with supervision;with set-up;sitting Pt Will Perform Upper Body Bathing: with min assist;sitting Pt Will Perform Upper Body Dressing: with supervision;sitting Pt Will Transfer to Toilet: with +2 assist;with mod assist;stand pivot transfer Pt/caregiver will Perform Home Exercise Program: Increased strength;Both right and left upper extremity;With theraband;With written HEP provided Additional ADL Goal #1: Pt will perform bed mobility with +2 moderate assistance.  OT Frequency: Min 2X/week   Barriers to D/C: Decreased caregiver support          Co-evaluation   Reason for Co-Treatment: For patient/therapist safety;To address functional/ADL transfers PT goals addressed during session: Mobility/safety with mobility        AM-PAC PT "6 Clicks" Daily Activity     Outcome Measure Help from another person eating meals?: None Help from another person taking care of personal grooming?: A Little Help from another person toileting, which includes using toliet, bedpan, or urinal?: Total Help from another person bathing (including washing, rinsing, drying)?: Total Help from another person to put on and taking off regular upper body clothing?: A Lot Help from another person to put on and taking off regular  lower body clothing?: Total 6 Click Score: 12   End of Session Equipment Utilized During Treatment: Oxygen Nurse Communication: Other (comment)(itchy skin, needs bath)  Activity Tolerance: Patient limited by pain Patient left: in bed;with call bell/phone within reach  OT Visit Diagnosis: Muscle weakness (generalized) (M62.81);Pain                Time: 9983-3825 OT Time Calculation (min): 28 min Charges:  OT General Charges $OT Visit: 1 Visit OT Evaluation $OT Eval Moderate Complexity: 1 Mod G-Codes:     2017/06/21 Nestor Lewandowsky, OTR/L Pager: [phone]  Werner Lean Haze [place] 2017/06/21, 3:43 PM

## 2017-06-12 NOTE — Evaluation (Signed)
Physical Therapy Evaluation Patient Details Name: Jodi Underwood MRN: [id number] DOB: 09/12/1945 Today's Date: 06/12/2017   History of Present Illness  Patient is a 71 y/o female who presents with edema BLEs and SOB. Found to be in A-fib with RVR. PMH includes morbid obesity, lymphedema, A-fib, HTN, DM, asthma, neuropathy, UI.  Clinical Impression  Patient presents with generalized weakness, deconditioning, lymphedema BLEs, pain and impaired mobility s/p above. Pt from home alone and requires assist for IADLs and aide for washing feet. Pt uses motorized w/c PTA and transfers Mod I. Pt not able to tolerate getting to EOB due to pain in BLEs but is total A for all care at this time. Not safe to return home alone. Would benefit from SNF to maximize independence and mobility prior to return home. Will follow.     Follow Up Recommendations SNF    Equipment Recommendations  Other (comment)(defer to SNF)    Recommendations for Other Services       Precautions / Restrictions Precautions Precautions: Fall Restrictions Weight Bearing Restrictions: No      Mobility  Bed Mobility Overal bed mobility: Needs Assistance             General bed mobility comments: Attempted to assist pt to sit EOB, unable to tolerate due to B LE pain. +2 total assist to pull up in bed and position LEs on pillows.  Transfers                 General transfer comment: unable  Ambulation/Gait                Stairs            Wheelchair Mobility    Modified Rankin (Stroke Patients Only)       Balance Overall balance assessment: No apparent balance deficits (not formally assessed)                                           Pertinent Vitals/Pain Pain Assessment: Faces Faces Pain Scale: Hurts whole lot Pain Location: B LEs Pain Descriptors / Indicators: Crying;Moaning;Grimacing;Guarding Pain Intervention(s): Monitored during session;Repositioned;Limited  activity within patient's tolerance    Home Living Family/patient expects to be discharged to:: Private residence Living Arrangements: Alone Available Help at Discharge: Neighbor;Available PRN/intermittently;Personal care attendant(aide comes twice/week to wash feet/LEs) Type of Home: House Home Access: Ramped entrance     Home Layout: One level Home Equipment: Wheelchair - power;Bedside commode;Walker - 2 wheels;Walker - 4 wheels;Cane - single point;Transport chair      Prior Function Level of Independence: Needs assistance   Gait / Transfers Assistance Needed: use motorized, w/c for mobility, performs stand pivot transfers independently  ADL's / Homemaking Assistance Needed: orders groceries, neighbors help with lymphedema sleeves, aide helps with washing feet        Hand Dominance   Dominant Hand: Right    Extremity/Trunk Assessment   Upper Extremity Assessment Upper Extremity Assessment: Defer to OT evaluation    Lower Extremity Assessment Lower Extremity Assessment: Generalized weakness(lymphadema present BLEs; able to initiate lifting BLEs for positioning of pillows but not able to lift against gravity without assist. )       Communication   Communication: No difficulties  Cognition Arousal/Alertness: Awake/alert Behavior During Therapy: WFL for tasks assessed/performed Overall Cognitive Status: Impaired/Different from baseline Area of Impairment: Problem solving;Safety/judgement  Safety/Judgement: Decreased awareness of safety;Decreased awareness of deficits   Problem Solving: Slow processing General Comments: Pt believes she can manage at home despite limited ability to participate in session.      General Comments      Exercises     Assessment/Plan    PT Assessment Patient needs continued PT services  PT Problem List Decreased strength;Decreased mobility;Decreased safety awareness;Obesity;Pain;Decreased activity  tolerance;Decreased range of motion;Decreased skin integrity       PT Treatment Interventions Balance training;Patient/family education;Therapeutic activities;Therapeutic exercise;Wheelchair mobility training    PT Goals (Current goals can be found in the Care Plan section)  Acute Rehab PT Goals Patient Stated Goal: to return home PT Goal Formulation: With patient Time For Goal Achievement: 06/26/17 Potential to Achieve Goals: Fair    Frequency Min 2X/week   Barriers to discharge Decreased caregiver support lives alone    Co-evaluation PT/OT/SLP Co-Evaluation/Treatment: Yes Reason for Co-Treatment: For patient/therapist safety;To address functional/ADL transfers PT goals addressed during session: Mobility/safety with mobility         AM-PAC PT "6 Clicks" Daily Activity  Outcome Measure Difficulty turning over in bed (including adjusting bedclothes, sheets and blankets)?: Unable Difficulty moving from lying on back to sitting on the side of the bed? : Unable Difficulty sitting down on and standing up from a chair with arms (e.g., wheelchair, bedside commode, etc,.)?: Unable Help needed moving to and from a bed to chair (including a wheelchair)?: Total Help needed walking in hospital room?: Total Help needed climbing 3-5 steps with a railing? : Total 6 Click Score: 6    End of Session   Activity Tolerance: Patient limited by pain Patient left: in bed;with call bell/phone within reach Nurse Communication: Mobility status;Need for lift equipment PT Visit Diagnosis: Muscle weakness (generalized) (M62.81);Pain Pain - Right/Left: (bilateral) Pain - part of body: Leg    Time: 1445-1513 PT Time Calculation (min) (ACUTE ONLY): 28 min   Charges:   PT Evaluation $PT Eval Moderate Complexity: 1 Mod     PT G CodesWray Kearns, PT, DPT [phone]    Marguarite Arbour A Akima Slaugh 06/12/2017, 3:56 PM

## 2017-06-12 NOTE — Progress Notes (Signed)
ANTICOAGULATION CONSULT NOTE - Follow Up Consult  Pharmacy Consult for Heparin  Indication: atrial fibrillation  No Known Allergies  Patient Measurements: Height: 5\' 6"  (167.6 cm) Weight: (!) 450 lb (204.1 kg) IBW/kg (Calculated) : 59.3  Heparin dosing weight: 113 kg  Vital Signs: Temp: 98.3 F (36.8 C) (11/27 0413) Temp Source: Oral (11/27 0413) BP: 106/72 (11/27 0413) Pulse Rate: 115 (11/27 0413)  Labs: Recent Labs    06/11/17 0037 06/11/17 0423 06/11/17 0918 06/11/17 1815 06/12/17 0254 06/12/17 0850  HGB 11.8*  --   --   --  11.4*  --   HCT 41.1  --   --   --  41.6  --   PLT 261  --   --   --  291  --   APTT 33  --   --   --   --   --   LABPROT 15.2  --   --   --  15.8*  --   INR 1.21  --   --   --  1.27  --   HEPARINUNFRC  --   --  0.50 0.66  --  0.80*  CREATININE 1.47*  --   --   --  1.65*  --   TROPONINI  --  0.05* 0.04* 0.04*  --   --    Medical History: Past Medical History:  Diagnosis Date  . Anemia   . Arthritis   . Asthma   . Atrial fibrillation ([place])   . Degenerative disc disease   . Diabetes mellitus   . Diabetic neuropathy ([place])   . Edema extremities   . Hypertension   . Lymphedema   . Pneumonia   . Urinary incontinence    Assessment: 71 y/o female with atrial fibrillation on coumadin and heparin. Not on anticoagulation prior to admission.  This morning, the heparin level = 0.8 on heparin drip rate 1400 units/hr.   INR 1.27 .  Started on coumadin last night, although dose not charted.  Hgb low/stable and PLTC is within normal limits.  No bleeding noted.   Goal of Therapy:  Heparin level 0.3-0.7 units/ml Monitor platelets by anticoagulation protocol: Yes   Plan:   Decrease heparin drip to 1300 units/hr Check 6 hour heparin level.  Give Coumadin  Daily heparin level, PT/INR and CBC  Nicole Cella, [place] Clinical Pharmacist 8A-4P [phone] 4P-10P [phone] [place] [phone] 06/12/2017 10:13 AM

## 2017-06-12 NOTE — Progress Notes (Signed)
ANTICOAGULATION CONSULT NOTE - Follow Up Consult  Pharmacy Consult for Heparin  Indication: atrial fibrillation  No Known Allergies  Patient Measurements: Height: 5\' 6"  (167.6 cm) Weight: (!) 450 lb (204.1 kg) IBW/kg (Calculated) : 59.3  Heparin dosing weight: 113 kg  Vital Signs: Temp: 98.2 F (36.8 C) (11/27 1850) Temp Source: Oral (11/27 1850) BP: 131/65 (11/27 1850) Pulse Rate: 105 (11/27 1850)  Labs: Recent Labs    06/11/17 0037 06/11/17 0423  06/11/17 0918 06/11/17 1815 06/12/17 0254 06/12/17 0850 06/12/17 1638  HGB 11.8*  --   --   --   --  11.4*  --   --   HCT 41.1  --   --   --   --  41.6  --   --   PLT 261  --   --   --   --  291  --   --   APTT 33  --   --   --   --   --   --   --   LABPROT 15.2  --   --   --   --  15.8*  --   --   INR 1.21  --   --   --   --  1.27  --   --   HEPARINUNFRC  --   --    < > 0.50 0.66  --  0.80* 0.75*  CREATININE 1.47*  --   --   --   --  1.65*  --   --   TROPONINI  --  0.05*  --  0.04* 0.04*  --   --   --    < > = values in this interval not displayed.   Medical History: Past Medical History:  Diagnosis Date  . Anemia   . Arthritis   . Asthma   . Atrial fibrillation ([place])   . Degenerative disc disease   . Diabetes mellitus   . Diabetic neuropathy ([place])   . Edema extremities   . Hypertension   . Lymphedema   . Pneumonia   . Urinary incontinence    Assessment: 71 y/o female with atrial fibrillation on coumadin and heparin. Not on anticoagulation prior to admission.  -heparin level= 0.75 after decrease to 1300 units/hr  Goal of Therapy:  Heparin level 0.3-0.7 units/ml Monitor platelets by anticoagulation protocol: Yes   Plan:   Decrease heparin drip to 1150 units/hr Daily heparin level and CBC  Hildred Laser, Pharm D 06/12/2017 7:38 PM

## 2017-06-12 NOTE — Progress Notes (Signed)
Pt. wanting sleep med and hospitalist informed.

## 2017-06-12 NOTE — Plan of Care (Signed)
  Education: Knowledge of General Education information will improve 06/12/2017 0039 - Progressing by Anson Fret, RN Note POC and orders reviewed with pt.

## 2017-06-12 NOTE — Discharge Instructions (Signed)

## 2017-06-12 NOTE — Progress Notes (Signed)
Progress Note  Patient Name: Jodi Underwood Date of Encounter: 06/12/2017  Primary Cardiologist: new - Dr. Acie Fredrickson  Subjective   Pt states she is breathing better and wants off the oxygen.  Inpatient Medications    Scheduled Meds: . fluticasone  2 spray Each Nare Daily  . furosemide  40 mg Intravenous BID  . insulin aspart  0-15 Units Subcutaneous TID WC  . insulin NPH Human  30 Units Subcutaneous BID AC & HS  . losartan  25 mg Oral Daily  . metoprolol tartrate  25 mg Oral Once  . metoprolol tartrate  50 mg Oral BID  . senna  1 tablet Oral Daily  . warfarin  7.5 mg Oral ONCE-1800  . Warfarin - Pharmacist Dosing Inpatient   Does not apply q1800   Continuous Infusions: . heparin 1,300 Units/hr (06/12/17 1028)   PRN Meds: acetaminophen **OR** acetaminophen, albuterol, ondansetron **OR** ondansetron (ZOFRAN) IV   Vital Signs    Vitals:   06/11/17 1800 06/11/17 1959 06/12/17 0008 06/12/17 0413  BP: 133/83 113/83 (!) 113/36 106/72  Pulse: (!) 106 (!) 110 (!) 57 (!) 115  Resp: 20 20 20 20   Temp:  (!) 97.4 F (36.3 C) 98 F (36.7 C) 98.3 F (36.8 C)  TempSrc:   Oral Oral  SpO2: 99% 94% 95% 96%  Weight:    (!) 450 lb (204.1 kg)  Height:        Intake/Output Summary (Last 24 hours) at 06/12/2017 1029 Last data filed at 06/12/2017 7673 Gross per 24 hour  Intake 458 ml  Output 400 ml  Net 58 ml   Filed Weights   06/10/17 2347 06/12/17 0413  Weight: (!) 449 lb (203.7 kg) (!) 450 lb (204.1 kg)     Physical Exam   General: morbidly obese female appearing in no acute distress. Head: Normocephalic, atraumatic.  Neck: no deformities Lungs:  Resp regular and unlabored, CTA in upper lobes but exam difficult Heart: Irreg. Irreg. , S1, S2, no murmur; no rub. Abdomen: morbidly obese  Extremities: No clubbing, cyanosis, massive  bilateral lymphedema Neuro: Alert and oriented X 3. Difficult to move extremities given obesity and deconditioned state Psych: Normal  affect.  Labs    Chemistry Recent Labs  Lab 06/11/17 0037 06/12/17 0254  NA 141 139  K 4.3 4.3  CL 105 102  CO2 27 30  GLUCOSE 200* 154*  BUN 21* 24*  CREATININE 1.47* 1.65*  CALCIUM 9.4 8.8*  PROT 7.0  --   ALBUMIN 3.3*  --   AST 26  --   ALT 18  --   ALKPHOS 81  --   BILITOT 1.0  --   GFRNONAA 34* 30*  GFRAA 40* 35*  ANIONGAP 9 7     Hematology Recent Labs  Lab 06/11/17 0037 06/12/17 0254  WBC 8.7 8.1  RBC 4.80 4.67  HGB 11.8* 11.4*  HCT 41.1 41.6  MCV 85.6 89.1  MCH 24.6* 24.4*  MCHC 28.7* 27.4*  RDW 15.2 15.5  PLT 261 291    Cardiac Enzymes Recent Labs  Lab 06/11/17 0423 06/11/17 0918 06/11/17 1815  TROPONINI 0.05* 0.04* 0.04*    Recent Labs  Lab 06/11/17 0053  TROPIPOC 0.02     BNP Recent Labs  Lab 06/11/17 0037  BNP 109.9*     DDimer No results for input(s): DDIMER in the last 168 hours.   Radiology    Dg Chest Port 1 View  Result Date: 06/11/2017 CLINICAL DATA:  Shortness  of breath tonight. EXAM: PORTABLE CHEST 1 VIEW COMPARISON:  10/19/2016 FINDINGS: Examination is technically limited due to patient's body habitus. Cardiac enlargement. No vascular congestion. Lungs appear clear and expanded. No blunting of costophrenic angles. No pneumothorax. Unchanged appearance since previous study. IMPRESSION: Cardiac enlargement.  No evidence of active pulmonary disease. Electronically Signed   By: Lucienne Capers M.D.   On: 06/11/2017 01:21     Telemetry    Afib 100s, one spike in the 150s - Personally Reviewed  ECG    No new tracings - Personally Reviewed  Cardiac Studies   Echo 06/11/17  Study Conclusions - Left ventricle: The cavity size was normal. Systolic function was normal. The estimated ejection fraction was 50%. Hypokinesis of the anteroseptal, inferior, and inferoseptal myocardium. The study is not technically sufficient to allow evaluation of LV diastolic function. - Aortic valve: Transvalvular velocity was  within the normal range. There was no stenosis. There was no regurgitation. - Mitral valve: Transvalvular velocity was within the normal range. There was no evidence for stenosis. There was mild regurgitation. - Right ventricle: The cavity size was normal. Wall thickness was normal. Systolic function was normal. - Tricuspid valve: There was no regurgitation. - Pulmonary arteries: Systolic pressure was within the normal range. PA peak pressure: 18 mm Hg (S).  Impressions: - Technically difficult study with poor visualization of the endocardium and valves.   ECHO 09/27/14  Study Conclusions - Left ventricle: The cavity size was normal. There was moderate focal basal and moderate concentric hypertrophy of the septum. Systolic function was normal. Although no diagnostic regional wall motion abnormality was identified, this possibility cannot be completely excluded on the basis of this study. - Aortic valve: Trileaflet; mildly thickened leaflets. Valve area (VTI): 1.92 cm^2. Valve area (Vmax): 1.77 cm^2. Valve area (Vmean): 1.89 cm^2. - Mitral valve: Valve area by continuity equation (using LVOT flow): 2.71 cm^2. - Left atrium: The atrium was severely dilated. - Right atrium: The atrium was moderately dilated. - Pulmonary arteries: Systolic pressure was mildly to moderately increased. PA peak pressure: 38 mm Hg (S).    Patient Profile     71 y.o. female with a hx of DM, HTN, lymphedema and asthma who is being seen today for the evaluation of atrial fib - currently Afib I nthe 100-120s  Assessment & Plan    1. Atrial fibrillation with RVR - pt continues in Afib generally in the 100s, but with spikes in the 120-150s - not sustained - she remains unaware of her rate or rhythm - will increase lopressor to 75 mg BID - This patients CHA2DS2-VASc Score and unadjusted Ischemic Stroke Rate (% per year) is equal to 4.8 % stroke rate/year from a score of 4  (HTN, DM, age, female) - starting coumadin, initial INR 1.27  2. HTN - continue losartan - pressures have been well-controlled  3. DM - insulin per primary team  4. Lymphedema - likely secondary to Afib, but this is a long-standing chronic problem  5. Obesity, poor mobility - nutrition consult - PT/OT - pt currently refusing SNF for rehab - complex social issues   Signed, Courtney Paris 10:29 AM 06/12/2017 Pager: [phone]  Attending Note:   The patient was seen and examined.  Agree with assessment and plan as noted above.  Changes made to the above note as needed.  Patient seen and independently examined with Doreene Adas, PA .   We discussed all aspects of the encounter. I agree with the  assessment and plan as stated above.  1.  Persistent atrial fibrillation: Her heart rate is gradually getting a little slower.  Continue with current medical management. Increase metoprolol to 100 mg twice a day.  2.  Massive lymphedema: Plans per medical doctor.  She needs leg elevation.  I recommended that she get a Lounge Dr. Leg rest.     I have spent a total of 40 minutes with patient reviewing hospital  notes , telemetry, EKGs, labs and examining patient as well as establishing an assessment and plan that was discussed with the patient. > 50% of time was spent in direct patient care.   Thayer Headings, Brooke Bonito., MD, [facility] 06/12/2017, 1:28 PM [address],  [place] Pager [phone]

## 2017-06-12 NOTE — Progress Notes (Signed)
PROGRESS NOTE    Jodi Underwood  ZOX:[id number] DOB: 1946/04/21 DOA: 06/10/2017 PCP: Guadlupe Spanish, MD   Outpatient Specialists:     Brief Narrative:  Jodi Underwood is a 71 y.o. female with medical history significant of DM type II, asthma, morbid obesity, lymphedema; who presents with complaints of of progressively worsening shortness of breath over the last 3 weeks. At baseline patient lives alone and notes that she is normally ambulatory.she complains of noticing worsening lower extremity edema despite taken her diuretics. She is not normally on oxygen but noted her oxygen saturations also were dipping as low as 73% on room air at home. She felt as though she were able to change position shortness of breath symptoms seem to worsen. He complained of associated symptoms of intermittent chest discomfort, generalized weakness, and decreased appetite. The last day she has not been able to get out of her recliner due to symptoms.  Sometime in 2014-2015 followed in the evening [place] patient went into the hospital reports going into atrial fibrillation due to frequent breathing treatments from her history of asthma. She also admits to poor compliance with diabetic diet and does not routinely check her blood glucose levels. Upon EMS arrival patient was noted to be in atrial fibrillation with heart rates 140 to 170s. Given 5 mg of metoprolol IV in route. previously      Assessment & Plan:   Principal Problem:   Atrial fibrillation with RVR ([place]) Active Problems:   DM type 2, uncontrolled, with neuropathy ([place])   Morbid obesity ([place])   ANEMIA-NOS   Essential hypertension   Asthma   Lymphedema   A. fib with RVR: Acute.  CHADsVASc score = at least 4. -TSH normal -  magnesium level repleted - Continue Heparin drip per protocol and transition to coumadin - echocardiogram: Left ventricle: The cavity size was normal. Systolic function was   normal. The estimated ejection  fraction was 50%. Hypokinesis of   the anteroseptal, inferior, and inferoseptal myocardium. The   study is not technically sufficient to allow evaluation of LV   diastolic function. - Cardiology consulted appreciated -lopressor adjustments per cardiology    Acute respiratory failure -may need O2 at home-- wean as tolerated  Essential hypertension - should get better with diuresis  Diabetes mellitus type 2:  - hemoglobin A1c 7.4 -  Reduced home hemoglobin from 50 units down to 40 units bid while hospitalized - CBGs every before meals with moderate SSI  History of asthma: Patient does not appear to show any overt signs of acute asthma exacerbation at this time. - Albuterol as needed   Anemia: Hemoglobin 11.8 on admission which appears near patient's baseline. - Continue to monitor  Lymphedema: Chronic.  Morbid obesity:  Body mass index is 72.63 kg/m.     DVT prophylaxis:  Fully anticoagulated   Code Status: Full Code   Family Communication:   Disposition Plan:  ? Refusing SNF   Consultants:   cards    Subjective: Breathing better  Objective: Vitals:   06/12/17 0413 06/12/17 1207 06/12/17 1250 06/12/17 1252  BP: 106/72 121/77    Pulse: (!) 115 (!) 118    Resp: 20 18    Temp: 98.3 F (36.8 C) 97.8 F (36.6 C)    TempSrc: Oral Oral    SpO2: 96% 96% (!) 89% 96%  Weight: (!) 204.1 kg (450 lb)     Height:        Intake/Output Summary (Last 24 hours) at 06/12/2017 1311  Last data filed at 06/12/2017 6144 Gross per 24 hour  Intake 408 ml  Output 400 ml  Net 8 ml   Filed Weights   06/10/17 2347 06/12/17 0413  Weight: (!) 203.7 kg (449 lb) (!) 204.1 kg (450 lb)    Examination:  General exam: morbidly obese Respiratory system: diminished, difficult due to body habitus Cardiovascular system: irr and fast Gastrointestinal system: +BS, obese Central nervous system: A+OX3 Extremities: moves all 4 ext but has lymph edema in b/l  LE Psychiatry: Judgement and insight appear normal. Mood & affect appropriate.     Data Reviewed: I have personally reviewed following labs and imaging studies  CBC: Recent Labs  Lab 06/11/17 0037 06/12/17 0254  WBC 8.7 8.1  NEUTROABS 5.8  --   HGB 11.8* 11.4*  HCT 41.1 41.6  MCV 85.6 89.1  PLT 261 315   Basic Metabolic Panel: Recent Labs  Lab 06/11/17 0037 06/11/17 0423 06/12/17 0254  NA 141  --  139  K 4.3  --  4.3  CL 105  --  102  CO2 27  --  30  GLUCOSE 200*  --  154*  BUN 21*  --  24*  CREATININE 1.47*  --  1.65*  CALCIUM 9.4  --  8.8*  MG  --  1.6*  --    GFR: Estimated Creatinine Clearance: 57 mL/min (A) (by C-G formula based on SCr of 1.65 mg/dL (H)). Liver Function Tests: Recent Labs  Lab 06/11/17 0037  AST 26  ALT 18  ALKPHOS 81  BILITOT 1.0  PROT 7.0  ALBUMIN 3.3*   No results for input(s): LIPASE, AMYLASE in the last 168 hours. No results for input(s): AMMONIA in the last 168 hours. Coagulation Profile: Recent Labs  Lab 06/11/17 0037 06/12/17 0254  INR 1.21 1.27   Cardiac Enzymes: Recent Labs  Lab 06/11/17 0423 06/11/17 0918 06/11/17 1815  TROPONINI 0.05* 0.04* 0.04*   BNP (last 3 results) No results for input(s): PROBNP in the last 8760 hours. HbA1C: Recent Labs    06/11/17 0423  HGBA1C 7.4*   CBG: Recent Labs  Lab 06/11/17 0951 06/11/17 1153 06/11/17 2144 06/12/17 0752 06/12/17 1209  GLUCAP 203* 200* 174* 141* 186*   Lipid Profile: No results for input(s): CHOL, HDL, LDLCALC, TRIG, CHOLHDL, LDLDIRECT in the last 72 hours. Thyroid Function Tests: Recent Labs    06/11/17 0918  TSH 2.348   Anemia Panel: No results for input(s): VITAMINB12, FOLATE, FERRITIN, TIBC, IRON, RETICCTPCT in the last 72 hours. Urine analysis:    Component Value Date/Time   COLORURINE YELLOW 04/27/2016 2027   APPEARANCEUR CLOUDY (A) 04/27/2016 2027   LABSPEC 1.017 04/27/2016 2027   PHURINE 6.0 04/27/2016 2027   GLUCOSEU NEGATIVE  04/27/2016 2027   HGBUR MODERATE (A) 04/27/2016 2027   BILIRUBINUR NEGATIVE 04/27/2016 2027   KETONESUR NEGATIVE 04/27/2016 2027   PROTEINUR 30 (A) 04/27/2016 2027   UROBILINOGEN 0.2 09/29/2014 1224   NITRITE POSITIVE (A) 04/27/2016 2027   LEUKOCYTESUR MODERATE (A) 04/27/2016 2027     )No results found for this or any previous visit (from the past 240 hour(s)).    Anti-infectives (From admission, onward)   None       Radiology Studies: Dg Chest Port 1 View  Result Date: 06/11/2017 CLINICAL DATA:  Shortness of breath tonight. EXAM: PORTABLE CHEST 1 VIEW COMPARISON:  10/19/2016 FINDINGS: Examination is technically limited due to patient's body habitus. Cardiac enlargement. No vascular congestion. Lungs appear clear and expanded. No blunting  of costophrenic angles. No pneumothorax. Unchanged appearance since previous study. IMPRESSION: Cardiac enlargement.  No evidence of active pulmonary disease. Electronically Signed   By: Lucienne Capers M.D.   On: 06/11/2017 01:21        Scheduled Meds: . fluticasone  2 spray Each Nare Daily  . furosemide  40 mg Intravenous BID  . insulin aspart  0-15 Units Subcutaneous TID WC  . insulin NPH Human  30 Units Subcutaneous BID AC & HS  . losartan  25 mg Oral Daily  . metoprolol tartrate  75 mg Oral BID  . senna  1 tablet Oral Daily  . Warfarin - Pharmacist Dosing Inpatient   Does not apply q1800   Continuous Infusions: . heparin 1,300 Units/hr (06/12/17 1028)     LOS: 1 day    Time spent: 35 min    Geradine Girt, DO [facility] Pager [phone]  If 7PM-7AM, please contact night-coverage [email or web address] Password [email or web address] 06/12/2017, 1:11 PM

## 2017-06-12 NOTE — Care Management Note (Signed)
Case Management Note  Patient Details  Name: Jodi Underwood MRN: [id number] Date of Birth: [birth year]  Action/Plan: Received  Consult-Needs new wheelchair, does not fit; Patient stated that she purchased the wheelchair March 2018 and has been in constant contact with the DME agency [employer]; she was informed that they do have a bigger wheelchair to accommodate her size ( 450Lbs). Since patient recently purchased the wheelchair, CM cannot assist her in a new chair. She is now agreeable to short term SNF placement for rehab, Judson Roch SW called. Aneta Mins [phone]  Expected Discharge Date:  Possibly 06/16/2017              Expected Discharge Plan:  SNF In-House Referral:  Clinical Social Work  Discharge planning Services  CM Consult     Status of Service:  In process, will continue to follow  Sherrilyn Rist [phone] 06/12/2017, 2:03 PM

## 2017-06-13 LAB — BASIC METABOLIC PANEL
ANION GAP: 6 (ref 5–15)
BUN: 29 mg/dL — AB (ref 6–20)
CHLORIDE: 102 mmol/L (ref 101–111)
CO2: 31 mmol/L (ref 22–32)
Calcium: 8.9 mg/dL (ref 8.9–10.3)
Creatinine, Ser: 1.88 mg/dL — ABNORMAL HIGH (ref 0.44–1.00)
GFR calc Af Amer: 30 mL/min — ABNORMAL LOW (ref >60)
GFR, EST NON AFRICAN AMERICAN: 26 mL/min — AB (ref >60)
GLUCOSE: 79 mg/dL (ref 65–99)
POTASSIUM: 4.5 mmol/L (ref 3.5–5.1)
Sodium: 139 mmol/L (ref 135–145)

## 2017-06-13 LAB — GLUCOSE, CAPILLARY
GLUCOSE-CAPILLARY: 57 mg/dL — AB (ref 65–99)
GLUCOSE-CAPILLARY: 60 mg/dL — AB (ref 65–99)
GLUCOSE-CAPILLARY: 119 mg/dL — AB (ref 65–99)
GLUCOSE-CAPILLARY: 117 mg/dL — AB (ref 65–99)
GLUCOSE-CAPILLARY: 147 mg/dL — AB (ref 65–99)
Glucose-Capillary: 60 mg/dL — ABNORMAL LOW (ref 65–99)
Glucose-Capillary: 155 mg/dL — ABNORMAL HIGH (ref 65–99)

## 2017-06-13 LAB — PROTIME-INR
INR: 1.22
PROTHROMBIN TIME: 15.3 s — AB (ref 11.4–15.2)

## 2017-06-13 LAB — HEPARIN LEVEL (UNFRACTIONATED): Heparin Unfractionated: 0.46 IU/mL (ref 0.30–0.70)

## 2017-06-13 LAB — CBC
HEMATOCRIT: 42.6 % (ref 36.0–46.0)
HEMOGLOBIN: 11.4 g/dL — AB (ref 12.0–15.0)
MCH: 24.6 pg — AB (ref 26.0–34.0)
MCHC: 26.8 g/dL — ABNORMAL LOW (ref 30.0–36.0)
MCV: 91.8 fL (ref 78.0–100.0)
Platelets: 291 10*3/uL (ref 150–400)
RBC: 4.64 MIL/uL (ref 3.87–5.11)
RDW: 15.7 % — ABNORMAL HIGH (ref 11.5–15.5)
WBC: 9.1 10*3/uL (ref 4.0–10.5)

## 2017-06-13 MED ORDER — WARFARIN SODIUM 10 MG PO TABS
10.0000 mg | ORAL_TABLET | Freq: Once | ORAL | Status: AC
  Administered 2017-06-13: 10 mg via ORAL
  Filled 2017-06-13: qty 1

## 2017-06-13 MED ORDER — SORBITOL 70 % SOLN
960.0000 mL | TOPICAL_OIL | Freq: Once | ORAL | Status: AC
  Administered 2017-06-13: 960 mL via RECTAL
  Filled 2017-06-13: qty 473

## 2017-06-13 MED ORDER — POLYETHYLENE GLYCOL 3350 17 G PO PACK
17.0000 g | PACK | Freq: Two times a day (BID) | ORAL | Status: DC
  Administered 2017-06-15: 17 g via ORAL
  Filled 2017-06-13 (×6): qty 1

## 2017-06-13 MED ORDER — METOPROLOL TARTRATE 50 MG PO TABS
50.0000 mg | ORAL_TABLET | Freq: Once | ORAL | Status: AC
  Administered 2017-06-13: 50 mg via ORAL
  Filled 2017-06-13: qty 1

## 2017-06-13 MED ORDER — METOPROLOL TARTRATE 50 MG PO TABS
150.0000 mg | ORAL_TABLET | Freq: Two times a day (BID) | ORAL | Status: DC
  Administered 2017-06-13 – 2017-06-16 (×5): 150 mg via ORAL
  Filled 2017-06-13 (×6): qty 1

## 2017-06-13 MED ORDER — TRAZODONE HCL 50 MG PO TABS
50.0000 mg | ORAL_TABLET | Freq: Once | ORAL | Status: AC
  Administered 2017-06-13: 50 mg via ORAL
  Filled 2017-06-13: qty 1

## 2017-06-13 MED ORDER — FUROSEMIDE 40 MG PO TABS
40.0000 mg | ORAL_TABLET | Freq: Every day | ORAL | Status: DC
  Administered 2017-06-14 – 2017-06-16 (×3): 40 mg via ORAL
  Filled 2017-06-13 (×3): qty 1

## 2017-06-13 MED ORDER — INSULIN NPH (HUMAN) (ISOPHANE) 100 UNIT/ML ~~LOC~~ SUSP
30.0000 [IU] | Freq: Two times a day (BID) | SUBCUTANEOUS | Status: DC
  Administered 2017-06-13: 30 [IU] via SUBCUTANEOUS
  Filled 2017-06-13: qty 10

## 2017-06-13 NOTE — Progress Notes (Signed)
Pt older daughter [name] updated on current condition and concerns about immobility, and blood thinner monitoring. Patient is guarded with rehab recommendations. Please call [phone] or local son in law [phone]

## 2017-06-13 NOTE — Social Work (Addendum)
CSW met with pt at bedside to discuss SNF options and placement offers. Pt requested CSW leave packet at bedside for her to look at this afternoon.  CSW followed up with pt daughter Gilmore Laroche, [place] packet was sent electronically as requested to pt daughter.   CSW will continue to follow to support discharge when medically appropriate.   Alexander Mt, [place] Work [phone]

## 2017-06-13 NOTE — Progress Notes (Signed)
PROGRESS NOTE    Jodi Underwood  UVO:[id number] DOB: 1946-06-13 DOA: 06/10/2017 PCP: Guadlupe Spanish, MD   Outpatient Specialists:     Brief Narrative:  Jodi Underwood is a 71 y.o. female with medical history significant of DM type II, asthma, morbid obesity, lymphedema; who presents with complaints of of progressively worsening shortness of breath over the last 3 weeks. At baseline patient lives alone and notes that she is normally ambulatory.she complains of noticing worsening lower extremity edema despite taken her diuretics. She is not normally on oxygen but noted her oxygen saturations also were dipping as low as 73% on room air at home. Upon EMS arrival patient was noted to be in atrial fibrillation with heart rates 140 to 170s.      Assessment & Plan:   Principal Problem:   Atrial fibrillation with RVR (HCC) Active Problems:   DM type 2, uncontrolled, with neuropathy ([place])   Morbid obesity ([place])   ANEMIA-NOS   Essential hypertension   Asthma   Lymphedema   A. fib with RVR: Acute.  CHADsVASc score = at least 4. -TSH normal -  magnesium level repleted- recheck in AM - Continue Heparin drip per protocol and transition to coumadin - echocardiogram: Left ventricle: The cavity size was normal. Systolic function was   normal. The estimated ejection fraction was 50%. Hypokinesis of   the anteroseptal, inferior, and inferoseptal myocardium. The   study is not technically sufficient to allow evaluation of LV   diastolic function. - Cardiology consulted appreciated -lopressor adjustments per cardiology- now up to 150 BID -cardioversion to be considered after 1 month of therapeutic coumadin levels   -lasix adjusted from IV BID to daily  Acute respiratory failure -may need O2 -- wean as tolerated  Essential hypertension - improved  Renal insufficiency -? Baseline -has not improved with diuresis -difficult to tell volume status with morbid  obesity  Constipation -bowel regimen -enema  Diabetes mellitus type 2:  - hemoglobin A1c 7.4 -  Reduced home hemoglobin from 50 units down to 30 units bid while hospitalized due to renal failure - CBGs every before meals with moderate SSI  History of asthma: Patient does not appear to show any overt signs of acute asthma exacerbation at this time. - Albuterol as needed   Anemia: Hemoglobin 11.8 on admission which appears near patient's baseline. - Continue to monitor  Lymphedema: Chronic.  Morbid obesity:  Body mass index is 72.63 kg/m.     DVT prophylaxis:  Fully anticoagulated   Code Status: Full Code   Family Communication: Friend at bedside  Disposition Plan:  ?SNF   Consultants:   cards    Subjective: Breathing better  Objective: Vitals:   06/12/17 2338 06/13/17 0415 06/13/17 0934 06/13/17 1229  BP: 105/79 119/77 120/67 (!) 120/53  Pulse: 93 (!) 101 (!) 113 (!) 115  Resp: 18   20  Temp: 97.6 F (36.4 C) 98 F (36.7 C) 97.8 F (36.6 C) 98.2 F (36.8 C)  TempSrc: Oral Oral Oral Oral  SpO2: 100% 100% 100% 94%  Weight:      Height:        Intake/Output Summary (Last 24 hours) at 06/13/2017 1354 Last data filed at 06/13/2017 7425 Gross per 24 hour  Intake 967.08 ml  Output 400 ml  Net 567.08 ml   Filed Weights   06/10/17 2347 06/12/17 0413  Weight: (!) 203.7 kg (449 lb) (!) 204.1 kg (450 lb)    Examination:  General exam: in bed,  morbidly obese Respiratory system: no wheezing heard Cardiovascular system: irr- around 100 Gastrointestinal system: distended abdomen, large, diminished bowel sounds Central nervous system: A+OX3 Extremities: lymphedema     Data Reviewed: I have personally reviewed following labs and imaging studies  CBC: Recent Labs  Lab 06/11/17 0037 06/12/17 0254 06/13/17 0608  WBC 8.7 8.1 9.1  NEUTROABS 5.8  --   --   HGB 11.8* 11.4* 11.4*  HCT 41.1 41.6 42.6  MCV 85.6 89.1 91.8  PLT 261 291 494    Basic Metabolic Panel: Recent Labs  Lab 06/11/17 0037 06/11/17 0423 06/12/17 0254 06/13/17 0608  NA 141  --  139 139  K 4.3  --  4.3 4.5  CL 105  --  102 102  CO2 27  --  30 31  GLUCOSE 200*  --  154* 79  BUN 21*  --  24* 29*  CREATININE 1.47*  --  1.65* 1.88*  CALCIUM 9.4  --  8.8* 8.9  MG  --  1.6*  --   --    GFR: Estimated Creatinine Clearance: 50 mL/min (A) (by C-G formula based on SCr of 1.88 mg/dL (H)). Liver Function Tests: Recent Labs  Lab 06/11/17 0037  AST 26  ALT 18  ALKPHOS 81  BILITOT 1.0  PROT 7.0  ALBUMIN 3.3*   No results for input(s): LIPASE, AMYLASE in the last 168 hours. No results for input(s): AMMONIA in the last 168 hours. Coagulation Profile: Recent Labs  Lab 06/11/17 0037 06/12/17 0254 06/13/17 0608  INR 1.21 1.27 1.22   Cardiac Enzymes: Recent Labs  Lab 06/11/17 0423 06/11/17 0918 06/11/17 1815  TROPONINI 0.05* 0.04* 0.04*   BNP (last 3 results) No results for input(s): PROBNP in the last 8760 hours. HbA1C: Recent Labs    06/11/17 0423  HGBA1C 7.4*   CBG: Recent Labs  Lab 06/13/17 0750 06/13/17 0807 06/13/17 0829 06/13/17 0917 06/13/17 1127  GLUCAP 60* 60* 57* 119* 117*   Lipid Profile: No results for input(s): CHOL, HDL, LDLCALC, TRIG, CHOLHDL, LDLDIRECT in the last 72 hours. Thyroid Function Tests: Recent Labs    06/11/17 0918  TSH 2.348   Anemia Panel: No results for input(s): VITAMINB12, FOLATE, FERRITIN, TIBC, IRON, RETICCTPCT in the last 72 hours. Urine analysis:    Component Value Date/Time   COLORURINE YELLOW 04/27/2016 2027   APPEARANCEUR CLOUDY (A) 04/27/2016 2027   LABSPEC 1.017 04/27/2016 2027   PHURINE 6.0 04/27/2016 2027   GLUCOSEU NEGATIVE 04/27/2016 2027   HGBUR MODERATE (A) 04/27/2016 2027   BILIRUBINUR NEGATIVE 04/27/2016 2027   KETONESUR NEGATIVE 04/27/2016 2027   PROTEINUR 30 (A) 04/27/2016 2027   UROBILINOGEN 0.2 09/29/2014 1224   NITRITE POSITIVE (A) 04/27/2016 2027    LEUKOCYTESUR MODERATE (A) 04/27/2016 2027     )No results found for this or any previous visit (from the past 240 hour(s)).    Anti-infectives (From admission, onward)   None       Radiology Studies: No results found.      Scheduled Meds: . coumadin book   Does not apply Once  . fluticasone  2 spray Each Nare Daily  . [START ON 06/14/2017] furosemide  40 mg Oral Daily  . insulin aspart  0-15 Units Subcutaneous TID WC  . insulin NPH Human  30 Units Subcutaneous BID AC & HS  . losartan  25 mg Oral Daily  . metoprolol tartrate  150 mg Oral BID  . polyethylene glycol  17 g Oral BID  .  senna  1 tablet Oral Daily  . warfarin  10 mg Oral ONCE-1800  . Warfarin - Pharmacist Dosing Inpatient   Does not apply q1800   Continuous Infusions: . heparin 1,150 Units/hr (06/13/17 0643)     LOS: 2 days    Time spent: 35 min    Geradine Girt, DO [facility] Pager [phone]  If 7PM-7AM, please contact night-coverage [email or web address] Password [email or web address] 06/13/2017, 1:54 PM

## 2017-06-13 NOTE — NC FL2 (Signed)
[place] LEVEL OF CARE SCREENING TOOL     IDENTIFICATION  Patient Name: Jodi Underwood Birthdate: [birth year] Sex: female Admission Date (Current Location): 06/10/2017  [facility] and [place] Number:  Herbalist and Address:  [facility], [place] [address] [place], [place] [address]      Provider Number: [id number]  Attending Physician Name and Address:  Geradine Girt, DO  Relative Name and Phone Number:  Tykia Mellone [phone] daughter    Current Level of Care: Hospital Recommended Level of Care: [place] Prior Approval Number:    Date Approved/Denied: 01/24/13 PASRR Number: [id number]  Discharge Plan: SNF    Current Diagnoses: Patient Active Problem List   Diagnosis Date Noted  . Atrial fibrillation with RVR ([place]) 06/11/2017  . Cellulitis, leg 10/13/2016  . AKI (acute kidney injury) ([place]) 10/13/2016  . Hypertension   . Lymphedema of both lower extremities   . Abnormal CT of the abdomen   . Pressure injury of skin 04/28/2016  . Intractable lower abdominal pain 04/27/2016  . Constipation   . Respiratory distress   . Acute cystitis without hematuria   . Sepsis ([place]) 09/26/2014  . UTI (urinary tract infection) 09/26/2014  . Asthma 09/26/2014  . Asthma with acute exacerbation   . Blood poisoning   . Urinary tract infectious disease   . Diabetes type 2, uncontrolled ([place])   . Acute on chronic renal failure ([place])   . Essential hypertension, benign   . Acute renal failure ([place]) 01/25/2013  . Cellulitis of leg, bilateral 01/23/2013  . Ulcers of both lower extremities ([place]) 01/23/2013  . Lymphedema 06/13/2011  . DM type 2, uncontrolled, with neuropathy ([place]) 06/23/2010  . Morbid obesity ([place]) 06/23/2010  . ANEMIA-NOS 06/23/2010  . Essential hypertension 06/23/2010  . Asthma 06/23/2010    Orientation RESPIRATION BLADDER Height & Weight     Self, Time, Situation, Place  O2(3L nasal  canula) External catheter Weight: (!) 450 lb (204.1 kg) Height:  5\' 6"  (167.6 cm)  BEHAVIORAL SYMPTOMS/MOOD NEUROLOGICAL BOWEL NUTRITION STATUS      Continent Diet(carb modified/cardiac)  AMBULATORY STATUS COMMUNICATION OF NEEDS Skin   Total Care  Verbally Normal                       Personal Care Assistance Level of Assistance  Dressing, Feeding, Bathing Bathing Assistance: Maximum assistance Feeding assistance: Independent Dressing Assistance: Maximum assistance     Functional Limitations Info  Sight, Hearing, Speech Sight Info: Adequate Hearing Info: Adequate Speech Info: Adequate    SPECIAL CARE FACTORS FREQUENCY  OT (By licensed OT), PT (By licensed PT)     PT Frequency: 5x week OT Frequency: 5x week            Contractures Contractures Info: Not present    Additional Factors Info  Code Status, Allergies Code Status Info: Full Code Allergies Info: NKA           Current Medications (06/13/2017):  This is the current hospital active medication list Current Facility-Administered Medications  Medication Dose Route Frequency Provider Last Rate Last Dose  . acetaminophen (TYLENOL) tablet 650 mg  650 mg Oral Q6H PRN Fuller Plan A, MD       Or  . acetaminophen (TYLENOL) suppository 650 mg  650 mg Rectal Q6H PRN Smith, Rondell A, MD      . albuterol (PROVENTIL) (2.5 MG/3ML) 0.083% nebulizer solution 3 mL  3 mL Inhalation  Q4H PRN Norval Morton, MD      . camphor-menthol Timoteo Ace) lotion   Topical PRN Geradine Girt, DO      . coumadin book   Does not apply Once Vann, Jessica U, DO      . fluticasone (FLONASE) 50 MCG/ACT nasal spray 2 spray  2 spray Each Nare Daily Smith, Rondell A, MD   2 spray at 06/11/17 1048  . furosemide (LASIX) injection 40 mg  40 mg Intravenous BID Isaiah Serge, NP   40 mg at 06/13/17 0934  . heparin ADULT infusion 100 units/mL (25000 units/257mL sodium chloride 0.45%)  1,150 Units/hr Intravenous Continuous Kris Mouton, RPH  11.5 mL/hr at 06/13/17 2330 1,150 Units/hr at 06/13/17 0643  . insulin aspart (novoLOG) injection 0-15 Units  0-15 Units Subcutaneous TID WC Norval Morton, MD   2 Units at 06/12/17 1802  . insulin NPH Human (HUMULIN N,NOVOLIN N) injection 30 Units  30 Units Subcutaneous BID AC & HS Vann, Jessica U, DO      . losartan (COZAAR) tablet 25 mg  25 mg Oral Daily Isaiah Serge, NP   25 mg at 06/13/17 0941  . metoprolol tartrate (LOPRESSOR) tablet 100 mg  100 mg Oral BID Nahser, Wonda Cheng, MD   100 mg at 06/13/17 0935  . ondansetron (ZOFRAN) tablet 4 mg  4 mg Oral Q6H PRN Fuller Plan A, MD       Or  . ondansetron (ZOFRAN) injection 4 mg  4 mg Intravenous Q6H PRN Fuller Plan A, MD   4 mg at 06/11/17 1155  . senna (SENOKOT) tablet 8.6 mg  1 tablet Oral Daily Tamala Julian, Rondell A, MD   8.6 mg at 06/13/17 0934  . sorbitol, milk of mag, mineral oil, glycerin (SMOG) enema  960 mL Rectal Once Eulogio Bear U, DO      . warfarin (COUMADIN) tablet 10 mg  10 mg Oral ONCE-1800 Vann, Jessica U, DO      . Warfarin - Pharmacist Dosing Inpatient   Does not apply q1800 Isaiah Serge, NP         Discharge Medications: Please see discharge summary for a list of discharge medications.  Relevant Imaging Results:  Relevant Lab Results:   Additional Information SS# Lupus [place], [place]

## 2017-06-13 NOTE — Progress Notes (Signed)
ANTICOAGULATION CONSULT NOTE - Follow Up Consult  Pharmacy Consult for Heparin and Coumadin Indication: atrial fibrillation  No Known Allergies  Patient Measurements: Height: 5\' 6"  (167.6 cm) Weight: (!) 450 lb (204.1 kg) IBW/kg (Calculated) : 59.3  Heparin dosing weight: 113 kg  Vital Signs: Temp: 97.8 F (36.6 C) (11/28 0934) Temp Source: Oral (11/28 0934) BP: 120/67 (11/28 0934) Pulse Rate: 113 (11/28 0934)  Labs: Recent Labs    06/11/17 0037 06/11/17 0423  06/11/17 4709 06/11/17 1815 06/12/17 0254 06/12/17 0850 06/12/17 1638 06/13/17 0608  HGB 11.8*  --   --   --   --  11.4*  --   --  11.4*  HCT 41.1  --   --   --   --  41.6  --   --  42.6  PLT 261  --   --   --   --  291  --   --  291  APTT 33  --   --   --   --   --   --   --   --   LABPROT 15.2  --   --   --   --  15.8*  --   --  15.3*  INR 1.21  --   --   --   --  1.27  --   --  1.22  HEPARINUNFRC  --   --    < > 0.50 0.66  --  0.80* 0.75* 0.46  CREATININE 1.47*  --   --   --   --  1.65*  --   --  1.88*  TROPONINI  --  0.05*  --  0.04* 0.04*  --   --   --   --    < > = values in this interval not displayed.   Medical History: Past Medical History:  Diagnosis Date  . Anemia   . Arthritis   . Asthma   . Atrial fibrillation ([place])   . Degenerative disc disease   . Diabetes mellitus   . Diabetic neuropathy ([place])   . Edema extremities   . Hypertension   . Lymphedema   . Pneumonia   . Urinary incontinence    Assessment: 71 y/o female with atrial fibrillation on coumadin and heparin. Not on anticoagulation prior to admission.  -heparin level= 0.46 , therapeutic level after heparin drip decreased to 1150 units/hr.   INR = 1.22,  Coumadin started on 11/28. H/H low/stable, pltc wnl. No bleeding noted.   Goal of Therapy:  Heparin level 0.3-0.7 units/ml Monitor platelets by anticoagulation protocol: Yes   Plan:   Continue IV heparin drip 1150 units/hr Coumadin 10 mg po today x1 Daily heparin level,  INR and CBC   Nicole Cella, RPh Clinical Pharmacist 8A-4P [phone] 4P-10P [phone] [facility] [phone] 06/13/2017 10:30 AM

## 2017-06-13 NOTE — Progress Notes (Signed)
Progress Note  Patient Name: Jodi Underwood Date of Encounter: 06/13/2017  Primary Cardiologist: Dr. Acie Fredrickson  Subjective   Pt feeling well, still requiring oxygen. Considering rehab   Inpatient Medications    Scheduled Meds: . coumadin book   Does not apply Once  . fluticasone  2 spray Each Nare Daily  . furosemide  40 mg Intravenous BID  . insulin aspart  0-15 Units Subcutaneous TID WC  . insulin NPH Human  30 Units Subcutaneous BID AC & HS  . losartan  25 mg Oral Daily  . metoprolol tartrate  100 mg Oral BID  . senna  1 tablet Oral Daily  . sorbitol, milk of mag, mineral oil, glycerin (SMOG) enema  960 mL Rectal Once  . warfarin  10 mg Oral ONCE-1800  . Warfarin - Pharmacist Dosing Inpatient   Does not apply q1800   Continuous Infusions: . heparin 1,150 Units/hr (06/13/17 0643)   PRN Meds: acetaminophen **OR** acetaminophen, albuterol, camphor-menthol, ondansetron **OR** ondansetron (ZOFRAN) IV   Vital Signs    Vitals:   06/12/17 2016 06/12/17 2338 06/13/17 0415 06/13/17 0934  BP: 109/81 105/79 119/77 120/67  Pulse: (!) 101 93 (!) 101 (!) 113  Resp: 18 18    Temp: 97.6 F (36.4 C) 97.6 F (36.4 C) 98 F (36.7 C) 97.8 F (36.6 C)  TempSrc: Oral Oral Oral Oral  SpO2: 100% 100% 100% 100%  Weight:      Height:        Intake/Output Summary (Last 24 hours) at 06/13/2017 1148 Last data filed at 06/13/2017 8127 Gross per 24 hour  Intake 967.08 ml  Output 400 ml  Net 567.08 ml   Filed Weights   06/10/17 2347 06/12/17 0413  Weight: (!) 449 lb (203.7 kg) (!) 450 lb (204.1 kg)     Physical Exam   General: morbidly obese , female appearing in no acute distress. Head: Normocephalic, atraumatic.  Neck: exam difficult Lungs:  Resp regular and unlabored. Heart: irregular rate and rhythm, no murmur; no rub. Abdomen: Soft, non-tender, non-distended with normoactive bowel sounds. No hepatomegaly. No rebound/guarding. No obvious abdominal  masses. Extremities: extreme lymphedema bilaterally with chronic skin changes Neuro: Alert and oriented X 3. Moves all extremities spontaneously. Psych: Normal affect.  Labs    Chemistry Recent Labs  Lab 06/11/17 0037 06/12/17 0254 06/13/17 0608  NA 141 139 139  K 4.3 4.3 4.5  CL 105 102 102  CO2 27 30 31   GLUCOSE 200* 154* 79  BUN 21* 24* 29*  CREATININE 1.47* 1.65* 1.88*  CALCIUM 9.4 8.8* 8.9  PROT 7.0  --   --   ALBUMIN 3.3*  --   --   AST 26  --   --   ALT 18  --   --   ALKPHOS 81  --   --   BILITOT 1.0  --   --   GFRNONAA 34* 30* 26*  GFRAA 40* 35* 30*  ANIONGAP 9 7 6      Hematology Recent Labs  Lab 06/11/17 0037 06/12/17 0254 06/13/17 0608  WBC 8.7 8.1 9.1  RBC 4.80 4.67 4.64  HGB 11.8* 11.4* 11.4*  HCT 41.1 41.6 42.6  MCV 85.6 89.1 91.8  MCH 24.6* 24.4* 24.6*  MCHC 28.7* 27.4* 26.8*  RDW 15.2 15.5 15.7*  PLT 261 291 291    Cardiac Enzymes Recent Labs  Lab 06/11/17 0423 06/11/17 0918 06/11/17 1815  TROPONINI 0.05* 0.04* 0.04*    Recent Labs  Lab 06/11/17  0053  TROPIPOC 0.02     BNP Recent Labs  Lab 06/11/17 0037  BNP 109.9*     DDimer No results for input(s): DDIMER in the last 168 hours.   Radiology    No results found.   Telemetry    Afib 100-120s - Personally Reviewed  ECG    No new tracings - Personally Reviewed   Cardiac Studies   Echo 06/11/17 Study Conclusions - Left ventricle: The cavity size was normal. Systolic function was normal. The estimated ejection fraction was 50%. Hypokinesis of the anteroseptal, inferior, and inferoseptal myocardium. The study is not technically sufficient to allow evaluation of LV diastolic function. - Aortic valve: Transvalvular velocity was within the normal range. There was no stenosis. There was no regurgitation. - Mitral valve: Transvalvular velocity was within the normal range. There was no evidence for stenosis. There was mild regurgitation. - Right  ventricle: The cavity size was normal. Wall thickness was normal. Systolic function was normal. - Tricuspid valve: There was no regurgitation. - Pulmonary arteries: Systolic pressure was within the normal range. PA peak pressure: 18 mm Hg (S).  Impressions: - Technically difficult study with poor visualization of the endocardium and valves.  Patient Profile     71 y.o. female with a hx of DM, HTN, lymphedema and asthmawho is being seen today for the evaluation of atrial fib- currently Afib I nthe 100-120s  Assessment & Plan    1. Atrial fibrillation with RVR - Afib in the 100-120s - now on lopressor 100 mg BID - pt tolerating rate and rhythm well This patients CHA2DS2-VASc Score and unadjusted Ischemic Stroke Rate (% per year) is equal to 4.8 % stroke rate/year from a score of 4 (HTN, DM, age, female) - on coumadin, INR 1.22 - may consider cardioversion after 30 days of therapeutic INR  2. HTN - pressures well-controlled - continue losartan  3. DM - per primary team  4. Lymphedema - chronic problem, elevated legs  5. Obesity, poor mobility - nutrition consulted - PT/OT - difficult disposition, pt is now considering rehab/SNF   Signed, Ledora Bottcher , PA-C 11:48 AM 06/13/2017 Pager: [phone]  Attending Note:   The patient was seen and examined.  Agree with assessment and plan as noted above.  Changes made to the above note as needed.  Patient seen and independently examined with Doreene Adas , PA .   We discussed all aspects of the encounter. I agree with the assessment and plan as stated above.  Atrial fibrillation: Currently on Coumadin.  INR subtherapeutic. Continue metoprolol for rate control for now.  Increase metoprolol to 150 bid   Consider cardioversion after she is been therapeutic for a month.  2.  Lymphedema: She needs leg elevation. I recommended lounge Dr. leg rest which was designed and invented by Dr. Gae Gallop.     Go to  [email or web address] or available on [email or web address]        I have spent a total of 40 minutes with patient reviewing hospital  notes , telemetry, EKGs, labs and examining patient as well as establishing an assessment and plan that was discussed with the patient. > 50% of time was spent in direct patient care.    Thayer Headings, Brooke Bonito., MD, [facility] 06/13/2017, 12:15 PM [address]  [place] Pager [phone]

## 2017-06-14 LAB — CBC
HEMATOCRIT: 41.4 % (ref 36.0–46.0)
Hemoglobin: 11.5 g/dL — ABNORMAL LOW (ref 12.0–15.0)
MCH: 25.4 pg — ABNORMAL LOW (ref 26.0–34.0)
MCHC: 27.8 g/dL — ABNORMAL LOW (ref 30.0–36.0)
MCV: 91.6 fL (ref 78.0–100.0)
Platelets: 228 10*3/uL (ref 150–400)
RBC: 4.52 MIL/uL (ref 3.87–5.11)
RDW: 16 % — AB (ref 11.5–15.5)
WBC: 8.4 10*3/uL (ref 4.0–10.5)

## 2017-06-14 LAB — BASIC METABOLIC PANEL
ANION GAP: 7 (ref 5–15)
BUN: 33 mg/dL — AB (ref 6–20)
CO2: 30 mmol/L (ref 22–32)
Calcium: 8.7 mg/dL — ABNORMAL LOW (ref 8.9–10.3)
Chloride: 102 mmol/L (ref 101–111)
Creatinine, Ser: 1.84 mg/dL — ABNORMAL HIGH (ref 0.44–1.00)
GFR calc Af Amer: 30 mL/min — ABNORMAL LOW (ref >60)
GFR, EST NON AFRICAN AMERICAN: 26 mL/min — AB (ref >60)
Glucose, Bld: 120 mg/dL — ABNORMAL HIGH (ref 65–99)
POTASSIUM: 4.9 mmol/L (ref 3.5–5.1)
SODIUM: 139 mmol/L (ref 135–145)

## 2017-06-14 LAB — GLUCOSE, CAPILLARY
GLUCOSE-CAPILLARY: 82 mg/dL (ref 65–99)
GLUCOSE-CAPILLARY: 133 mg/dL — AB (ref 65–99)
GLUCOSE-CAPILLARY: 138 mg/dL — AB (ref 65–99)
Glucose-Capillary: 84 mg/dL (ref 65–99)

## 2017-06-14 LAB — MAGNESIUM: MAGNESIUM: 1.9 mg/dL (ref 1.7–2.4)

## 2017-06-14 LAB — HEPARIN LEVEL (UNFRACTIONATED)
HEPARIN UNFRACTIONATED: 0.57 [IU]/mL (ref 0.30–0.70)
Heparin Unfractionated: 0.23 IU/mL — ABNORMAL LOW (ref 0.30–0.70)

## 2017-06-14 LAB — PROTIME-INR
INR: 1.3
Prothrombin Time: 16.1 seconds — ABNORMAL HIGH (ref 11.4–15.2)

## 2017-06-14 MED ORDER — WARFARIN SODIUM 10 MG PO TABS
10.0000 mg | ORAL_TABLET | Freq: Once | ORAL | Status: AC
  Administered 2017-06-14: 10 mg via ORAL
  Filled 2017-06-14: qty 1

## 2017-06-14 MED ORDER — INSULIN NPH (HUMAN) (ISOPHANE) 100 UNIT/ML ~~LOC~~ SUSP
30.0000 [IU] | Freq: Every day | SUBCUTANEOUS | Status: DC
  Administered 2017-06-15: 30 [IU] via SUBCUTANEOUS
  Filled 2017-06-14: qty 10

## 2017-06-14 NOTE — Progress Notes (Signed)
In discussing care with daughter by phone she expressed concern about the patient being tired sounding. I reevaluated the patient this afternoon. The patient is drowsy but oriented and neurological exam shows no focal deficits in sensory or motor function. She states she did not sleep well last night.  Somnolence: Uncertain cause. CBGs wnl, possibly lower than the patient is used to. No hypoxia, does not appear to be hypercarbic. LFTs normal 11/26. Not appreciably uremic. No offending medications recently started. Fortunately no focal neurological deficits to indicate stroke.  - Neuro checks, would get stat CT head w/o contrast if develops a deficit.  - Will follow up in AM.   Vance Gather, MD 06/14/2017 6:46 PM

## 2017-06-14 NOTE — Social Work (Signed)
CSW met with patient at bedside this morning to check in and follow up about any SNF preferences from offers given.  Pt stated that she has not had a chance to look at the info and asked that besides Gilmore Laroche, that I follow up with [name] (daughter in [place]) to inform her about SNF option and placement choices.   CSW will continue to follow.  Alexander Mt, [place] Work [phone]

## 2017-06-14 NOTE — Progress Notes (Signed)
ANTICOAGULATION CONSULT NOTE  Pharmacy Consult for Heparin Indication: atrial fibrillation  No Known Allergies  Patient Measurements: Height: 5\' 6"  (167.6 cm) Weight: (!) 428 lb (194.1 kg)(bed scale) IBW/kg (Calculated) : 59.3  Heparin dosing weight: 113 kg  Vital Signs: Temp: 97.8 F (36.6 C) (11/29 1204) Temp Source: Oral (11/29 1204) BP: 113/53 (11/29 1204) Pulse Rate: 93 (11/29 1204)  Labs: Recent Labs    06/11/17 1815  06/12/17 0254  06/13/17 0608 06/14/17 0451 06/14/17 1531  HGB  --    < > 11.4*  --  11.4* 11.5*  --   HCT  --   --  41.6  --  42.6 41.4  --   PLT  --   --  291  --  291 228  --   LABPROT  --   --  15.8*  --  15.3* 16.1*  --   INR  --   --  1.27  --  1.22 1.30  --   HEPARINUNFRC 0.66  --   --    < > 0.46 0.23* 0.57  CREATININE  --   --  1.65*  --  1.88* 1.84*  --   TROPONINI 0.04*  --   --   --   --   --   --    < > = values in this interval not displayed.   Assessment: 71 y/o female with atrial fibrillation for Coumadin and heparin. HL therapeutic, cbc stable.  Goal of Therapy:  Heparin level 0.3-0.7 units/ml Monitor platelets by anticoagulation protocol: Yes   Plan:   Continue heparin at 1400 units/hr Daily HL, CBC   Hughes Better, PharmD, BCPS Clinical Pharmacist 06/14/2017 4:51 PM

## 2017-06-14 NOTE — Progress Notes (Signed)
Patient has been very sleepy and drowsy since this morning.Arousable, communicative but easily falls back to sleep. Offered fruit cups and orange juice, able to eat  with 2 fruit cups and orange juice.

## 2017-06-14 NOTE — Progress Notes (Signed)
Patient refused Lopressor tonight. Patient was educated on the importance of the medication and still refused. Patient stated "that she believes the medication is making her drowsy."   Drue Flirt, RN

## 2017-06-14 NOTE — Progress Notes (Signed)
Occupational Therapy Treatment Patient Details Name: Jodi Underwood MRN: [id number] DOB: Nov 07, 1945 Today's Date: 06/14/2017    History of present illness Patient is a 71 y/o female who presents with edema BLEs and SOB. Found to be in A-fib with RVR. PMH includes morbid obesity, lymphedema, A-fib, HTN, DM, asthma, neuropathy, UI.   OT comments  Pt declined OOB this session due to pain BLE, but agreeable to ADL at bed level. Pt educated on benefits of movement fro edema, pain management, respiratory, and digestive health. Pt also educated on BUE exercises (arm circles) and is agreeable to HEP for BUE to be established next session. OT will continue to follow in the acute setting.   Follow Up Recommendations  SNF;Supervision/Assistance - 24 hour    Equipment Recommendations  Other (comment)(defer to next venue - Bariatric equipment needed)    Recommendations for Other Services      Precautions / Restrictions Precautions Precautions: Fall Restrictions Weight Bearing Restrictions: No       Mobility Bed Mobility               General bed mobility comments: Pt declined bed mobility this session  Transfers                 General transfer comment: unable    Balance                                           ADL either performed or assessed with clinical judgement   ADL Overall ADL's : Needs assistance/impaired Eating/Feeding: Bed level;Set up Eating/Feeding Details (indicate cue type and reason): finishing up breakfast when OT arrived in room Grooming: Wash/dry hands;Wash/dry face;Oral care;Bed level;Cueing for sequencing;Minimal assistance Grooming Details (indicate cue type and reason): Pt had trouble organizing ADL items and then sequencing tasks during oral care. Pt required mod verbal cues for task completion and when attempting to wash her hands did not realize that one of her hands was under the blanket still when she was trying to clean  it. Anti-itch lotion applied to right hand and forearm                               General ADL Comments: Pt reports she has not walked in over a year.     Vision   Additional Comments: "I need to have cataract surgery on my eye, I already had one eye done"   Perception     Praxis      Cognition Arousal/Alertness: Awake/alert Behavior During Therapy: WFL for tasks assessed/performed Overall Cognitive Status: Impaired/Different from baseline Area of Impairment: Problem solving;Safety/judgement                         Safety/Judgement: Decreased awareness of safety;Decreased awareness of deficits   Problem Solving: Slow processing;Requires verbal cues General Comments: trouble sequencing normal ADL activities        Exercises Exercises: Other exercises Other Exercises Other Exercises: arm circles at bed level   Shoulder Instructions       General Comments Pt pleasant throughout session but adamently refused OOB or EOB at this time    Pertinent Vitals/ Pain       Pain Assessment: Faces Faces Pain Scale: Hurts a little bit Pain Location: B LEs Pain Descriptors / Indicators: Grimacing;Moaning Pain Intervention(s):  Limited activity within patient's tolerance;Monitored during session  Home Living                                          Prior Functioning/Environment              Frequency  Min 2X/week        Progress Toward Goals  OT Goals(current goals can now be found in the care plan section)  Progress towards OT goals: Progressing toward goals  Acute Rehab OT Goals Patient Stated Goal: to return home OT Goal Formulation: With patient Time For Goal Achievement: 06/19/17 Potential to Achieve Goals: [place] Discharge plan remains appropriate;Frequency remains appropriate    Co-evaluation                 AM-PAC PT "6 Clicks" Daily Activity     Outcome Measure   Help from another person eating  meals?: A Little Help from another person taking care of personal grooming?: A Lot Help from another person toileting, which includes using toliet, bedpan, or urinal?: Total Help from another person bathing (including washing, rinsing, drying)?: A Lot Help from another person to put on and taking off regular upper body clothing?: A Lot Help from another person to put on and taking off regular lower body clothing?: Total 6 Click Score: 11    End of Session Equipment Utilized During Treatment: Oxygen  OT Visit Diagnosis: Muscle weakness (generalized) (M62.81);Pain Pain - Right/Left: (Bilateral) Pain - part of body: Leg   Activity Tolerance Patient limited by pain   Patient Left in bed;with call bell/phone within reach   Nurse Communication Mobility status(in room at EOS)        Time: 6004-5997 OT Time Calculation (min): 24 min  Charges: OT General Charges $OT Visit: 1 Visit OT Treatments $Self Care/Home Management : 8-22 mins  Hulda Humphrey OTR/L [place] 06/14/2017, 11:50 AM

## 2017-06-14 NOTE — Progress Notes (Signed)
Progress Note  Patient Name: Jodi Underwood Date of Encounter: 06/14/2017  Primary Cardiologist: Dr. Acie Fredrickson  Subjective   No new comoplaints  Inpatient Medications    Scheduled Meds: . coumadin book   Does not apply Once  . fluticasone  2 spray Each Nare Daily  . furosemide  40 mg Oral Daily  . insulin aspart  0-15 Units Subcutaneous TID WC  . insulin NPH Human  30 Units Subcutaneous BID AC & HS  . losartan  25 mg Oral Daily  . metoprolol tartrate  150 mg Oral BID  . polyethylene glycol  17 g Oral BID  . senna  1 tablet Oral Daily  . warfarin  10 mg Oral ONCE-1800  . Warfarin - Pharmacist Dosing Inpatient   Does not apply q1800   Continuous Infusions: . heparin 1,400 Units/hr (06/14/17 0800)   PRN Meds: acetaminophen **OR** acetaminophen, albuterol, camphor-menthol, ondansetron **OR** ondansetron (ZOFRAN) IV   Vital Signs    Vitals:   06/13/17 2058 06/13/17 2236 06/14/17 0530 06/14/17 0946  BP: 119/64 109/65 109/61 (!) 106/56  Pulse: 98 100 (!) 110 (!) 103  Resp: 20  20   Temp: 98.4 F (36.9 C)  98.1 F (36.7 C)   TempSrc: Oral  Oral   SpO2: 100%  95%   Weight:      Height:        Intake/Output Summary (Last 24 hours) at 06/14/2017 1054 Last data filed at 06/14/2017 0533 Gross per 24 hour  Intake 276.55 ml  Output 1100 ml  Net -823.45 ml   Filed Weights   06/10/17 2347 06/12/17 0413 06/13/17 1524  Weight: (!) 449 lb (203.7 kg) (!) 450 lb (204.1 kg) (!) 428 lb (194.1 kg)     Physical Exam   General: morbidly obese, female appearing in no acute distress. Head: Normocephalic, atraumatic.  Neck: exam difficult Lungs:  Resp regular and unlabored Heart: irregular heart rhythm, irregular rate Abdomen: normoactive bowel sounds.  Extremities: lymphedema  Neuro: Alert and oriented X 3.  Psych: Normal affect.  Labs    Chemistry Recent Labs  Lab 06/11/17 0037 06/12/17 0254 06/13/17 0608 06/14/17 0451  NA 141 139 139 139  K 4.3 4.3 4.5  4.9  CL 105 102 102 102  CO2 27 30 31 30   GLUCOSE 200* 154* 79 120*  BUN 21* 24* 29* 33*  CREATININE 1.47* 1.65* 1.88* 1.84*  CALCIUM 9.4 8.8* 8.9 8.7*  PROT 7.0  --   --   --   ALBUMIN 3.3*  --   --   --   AST 26  --   --   --   ALT 18  --   --   --   ALKPHOS 81  --   --   --   BILITOT 1.0  --   --   --   GFRNONAA 34* 30* 26* 26*  GFRAA 40* 35* 30* 30*  ANIONGAP 9 7 6 7      Hematology Recent Labs  Lab 06/12/17 0254 06/13/17 0608 06/14/17 0451  WBC 8.1 9.1 8.4  RBC 4.67 4.64 4.52  HGB 11.4* 11.4* 11.5*  HCT 41.6 42.6 41.4  MCV 89.1 91.8 91.6  MCH 24.4* 24.6* 25.4*  MCHC 27.4* 26.8* 27.8*  RDW 15.5 15.7* 16.0*  PLT 291 291 228    Cardiac Enzymes Recent Labs  Lab 06/11/17 0423 06/11/17 0918 06/11/17 1815  TROPONINI 0.05* 0.04* 0.04*    Recent Labs  Lab 06/11/17 0053  TROPIPOC 0.02  BNP Recent Labs  Lab 06/11/17 0037  BNP 109.9*     DDimer No results for input(s): DDIMER in the last 168 hours.   Radiology    No results found.   Telemetry    Afib 90-100s, spikes in the 120s but not sustained - Personally Reviewed  ECG    No new tracings - Personally Reviewed  Cardiac Studies   Echo 06/11/17 Study Conclusions - Left ventricle: The cavity size was normal. Systolic function was normal. The estimated ejection fraction was 50%. Hypokinesis of the anteroseptal, inferior, and inferoseptal myocardium. The study is not technically sufficient to allow evaluation of LV diastolic function. - Aortic valve: Transvalvular velocity was within the normal range. There was no stenosis. There was no regurgitation. - Mitral valve: Transvalvular velocity was within the normal range. There was no evidence for stenosis. There was mild regurgitation. - Right ventricle: The cavity size was normal. Wall thickness was normal. Systolic function was normal. - Tricuspid valve: There was no regurgitation. - Pulmonary arteries: Systolic pressure was  within the normal range. PA peak pressure: 18 mm Hg (S).  Impressions: - Technically difficult study with poor visualization of the endocardium and valves.  Patient Profile     71 y.o. female  with a hx of DM, HTN, lymphedema and asthmawho is being seen today for the evaluation of atrial fib- rate better controlled in the 90-100s  Assessment & Plan    1. Afib RVR - pt continues in Afib, slightly better rate-controlled - 90-100s with spikes in the 120s - Lopressor increased to 150 mg BID yesterday, pt tolerating dose well - This patients CHA2DS2-VASc Score and unadjusted Ischemic Stroke Rate (% per year) is equal to4.8 % stroke rate/year from a score of 4(HTN, DM, age, female) - on coumadin, INR 1.30 - Hb stable at 11.5  2. HTN - pressures well-controlled with systolic pressures in the 100-120s - continue losartan  3. DM - per primary team  4. Lymphedema - discussed education and leg elevation  5.. Obesity, poor mobility - PT/OT - pt daughter received list of SNFs and is visiting facilities   Signed, Courtney Paris 10:54 AM 06/14/2017 Pager: [phone]  Attending Note:   The patient was seen and examined.  Agree with assessment and plan as noted above.  Changes made to the above note as needed.  Patient seen and independently examined with Doreene Adas, PA .   We discussed all aspects of the encounter. I agree with the assessment and plan as stated above.  1. Atrial fib:   HR is better on the higher dose of metoprolol Continue rate control and anticoagulation  2. somulence :   She is very sleepy today.  Her exam is nonfocal so I do not think that she has had a stroke.  We will continue to observe.   I have spent a total of 40 minutes with patient reviewing hospital  notes , telemetry, EKGs, labs and examining patient as well as establishing an assessment and plan that was discussed with the patient. > 50% of time was spent in direct patient  care.    Thayer Headings, Brooke Bonito., MD, [facility] 06/14/2017, 3:46 PM [address],  [place] Pager [phone]

## 2017-06-14 NOTE — Progress Notes (Signed)
PROGRESS NOTE    Jodi Underwood  WVP:[id number] DOB: [birth year] DOA: 06/10/2017 PCP: Guadlupe Spanish, MD   Outpatient Specialists:     Brief Narrative:  Jodi Underwood is a 71 y.o. female with medical history significant of DM type II, asthma, morbid obesity, lymphedema; who presents with complaints of of progressively worsening shortness of breath over the last 3 weeks. At baseline patient lives alone and notes that she is normally ambulatory.she complains of noticing worsening lower extremity edema despite taken her diuretics. She is not normally on oxygen but noted her oxygen saturations also were dipping as low as 73% on room air at home. Upon EMS arrival patient was noted to be in atrial fibrillation with heart rates 140 to 170s.      Assessment & Plan:   Principal Problem:   Atrial fibrillation with RVR (HCC) Active Problems:   DM type 2, uncontrolled, with neuropathy (HCC)   Morbid obesity (HCC)   ANEMIA-NOS   Essential hypertension   Asthma   Lymphedema  A. fib with RVR: Rate control improving with titration of labetalol. TSH wnl.  - Heparin bridge to coumadin CHA2DS2-VASc is 4. Cardiology to consider DCCV after therapeutic anticoagulation. - Monitor Mg, and K, replete as needed - Cardiology consulted appreciated  Acute respiratory failure: Likely multifactorial, states she thinks she needed it before arrival. OHS/OSA, possibly history of asthma likely contributing.  - Wean oxygen as tolerated - Breathing treatments prn. - Continue lasix 40mg  daily  Essential hypertension: Improved - Continue labetalol, losartan  Elevated creatinine: Based on CKD formula, CrCl is >60, elevated due to habitus.  - Monitor  Constipation:  - Continue bowel regimen  T2DM: HbA1c 7.4%. -  Reduced NPH 50u BID > 30u BID (but only given once daily) while hospitalized, suspect more moderate diet. Will continue NPH 30u qHS and moderate SSI, at inpatient goal.   Anemia:  Hemoglobin 11.8 on admission which appears near patient's baseline. - Continue to monitor  Lymphedema: Chronic.  Morbid obesity: Body mass index is 69.08 kg/m. - Nutrition consulted - PT/OT: Anticipate DC to SNF.   DVT prophylaxis: Heparin > coumadin Code Status: Full Code Family Communication: None at bedside. Daughter by phone Disposition Plan: SNF when medically ready  Procedures: Echo 06/11/17 Study Conclusions - Left ventricle: The cavity size was normal. Systolic function was normal. The estimated ejection fraction was 50%. Hypokinesis of the anteroseptal, inferior, and inferoseptal myocardium. The study is not technically sufficient to allow evaluation of LV diastolic function. - Aortic valve: Transvalvular velocity was within the normal range. There was no stenosis. There was no regurgitation. - Mitral valve: Transvalvular velocity was within the normal range. There was no evidence for stenosis. There was mild regurgitation. - Right ventricle: The cavity size was normal. Wall thickness was normal. Systolic function was normal. - Tricuspid valve: There was no regurgitation. - Pulmonary arteries: Systolic pressure was within the normal range. PA peak pressure: 18 mm Hg (S).  Impressions: - Technically difficult study with poor visualization of the endocardium and valves. Consultants: Cardiology  Subjective: No chest pain, palpitations, denies dyspnea currently.   Objective: Vitals:   06/13/17 2236 06/14/17 0530 06/14/17 0946 06/14/17 1204  BP: 109/65 109/61 (!) 106/56 (!) 113/53  Pulse: 100 (!) 110 (!) 103 93  Resp:  20  20  Temp:  98.1 F (36.7 C)  97.8 F (36.6 C)  TempSrc:  Oral  Oral  SpO2:  95%  99%  Weight:      Height:  Intake/Output Summary (Last 24 hours) at 06/14/2017 1700 Last data filed at 06/14/2017 1641 Gross per 24 hour  Intake 516.55 ml  Output 1100 ml  Net -583.45 ml   Filed Weights   06/10/17 2347  06/12/17 0413 06/13/17 1524  Weight: (!) 203.7 kg (449 lb) (!) 204.1 kg (450 lb) (!) 194.1 kg (428 lb)    Examination: Gen: Morbidly obese interactive female in NAD HEENT: MMM, posterior oropharynx clear Pulm: Non-labored with supplemental oxygen, very distant but no crackles or wheezes  CV: Irreg irreg, no murmur appreciated; unable to evaluate JVD, significant adiposity and lymphedema limits ability to reliably determine edema, +DP pulses GI: + BS; obese, soft, non-tender, non-distended Skin: No rashes, wounds, ulcers Neuro: Alert, oriented, conversant this AM, CN II-XII without deficits  CBC: Recent Labs  Lab 06/11/17 0037 06/12/17 0254 06/13/17 0608 06/14/17 0451  WBC 8.7 8.1 9.1 8.4  NEUTROABS 5.8  --   --   --   HGB 11.8* 11.4* 11.4* 11.5*  HCT 41.1 41.6 42.6 41.4  MCV 85.6 89.1 91.8 91.6  PLT 261 291 291 106   Basic Metabolic Panel: Recent Labs  Lab 06/11/17 0037 06/11/17 0423 06/12/17 0254 06/13/17 0608 06/14/17 0451  NA 141  --  139 139 139  K 4.3  --  4.3 4.5 4.9  CL 105  --  102 102 102  CO2 27  --  30 31 30   GLUCOSE 200*  --  154* 79 120*  BUN 21*  --  24* 29* 33*  CREATININE 1.47*  --  1.65* 1.88* 1.84*  CALCIUM 9.4  --  8.8* 8.9 8.7*  MG  --  1.6*  --   --  1.9   GFR: Estimated Creatinine Clearance: 49.4 mL/min (A) (by C-G formula based on SCr of 1.84 mg/dL (H)). Liver Function Tests: Recent Labs  Lab 06/11/17 0037  AST 26  ALT 18  ALKPHOS 81  BILITOT 1.0  PROT 7.0  ALBUMIN 3.3*   Coagulation Profile: Recent Labs  Lab 06/11/17 0037 06/12/17 0254 06/13/17 0608 06/14/17 0451  INR 1.21 1.27 1.22 1.30   Cardiac Enzymes: Recent Labs  Lab 06/11/17 0423 06/11/17 0918 06/11/17 1815  TROPONINI 0.05* 0.04* 0.04*   Urine analysis:    Component Value Date/Time   COLORURINE YELLOW 04/27/2016 2027   APPEARANCEUR CLOUDY (A) 04/27/2016 2027   LABSPEC 1.017 04/27/2016 2027   PHURINE 6.0 04/27/2016 2027   GLUCOSEU NEGATIVE 04/27/2016 2027     HGBUR MODERATE (A) 04/27/2016 2027   BILIRUBINUR NEGATIVE 04/27/2016 2027   KETONESUR NEGATIVE 04/27/2016 2027   PROTEINUR 30 (A) 04/27/2016 2027   UROBILINOGEN 0.2 09/29/2014 1224   NITRITE POSITIVE (A) 04/27/2016 2027   LEUKOCYTESUR MODERATE (A) 04/27/2016 2027   Radiology Studies: No results found.  Scheduled Meds: . coumadin book   Does not apply Once  . fluticasone  2 spray Each Nare Daily  . furosemide  40 mg Oral Daily  . insulin aspart  0-15 Units Subcutaneous TID WC  . insulin NPH Human  30 Units Subcutaneous BID AC & HS  . losartan  25 mg Oral Daily  . metoprolol tartrate  150 mg Oral BID  . polyethylene glycol  17 g Oral BID  . senna  1 tablet Oral Daily  . warfarin  10 mg Oral ONCE-1800  . Warfarin - Pharmacist Dosing Inpatient   Does not apply q1800   Continuous Infusions: . heparin 1,400 Units/hr (06/14/17 0800)     LOS: 3 days  Time spent: 35 min  Vance Gather, MD [facility] Pager [phone]   If 7PM-7AM, please contact night-coverage [email or web address] Password [email or web address] 06/14/2017, 5:00 PM

## 2017-06-14 NOTE — Progress Notes (Signed)
ANTICOAGULATION CONSULT NOTE - Follow Up Consult  Pharmacy Consult for Heparin and Coumadin Indication: atrial fibrillation  No Known Allergies  Patient Measurements: Height: 5\' 6"  (167.6 cm) Weight: (!) 428 lb (194.1 kg)(bed scale) IBW/kg (Calculated) : 59.3  Heparin dosing weight: 113 kg  Vital Signs: Temp: 98.1 F (36.7 C) (11/29 0530) Temp Source: Oral (11/29 0530) BP: 109/61 (11/29 0530) Pulse Rate: 110 (11/29 0530)  Labs: Recent Labs    06/11/17 0918 06/11/17 1815  06/12/17 0254  06/12/17 1638 06/13/17 0608 06/14/17 0451  HGB  --   --    < > 11.4*  --   --  11.4* 11.5*  HCT  --   --   --  41.6  --   --  42.6 41.4  PLT  --   --   --  291  --   --  291 228  LABPROT  --   --   --  15.8*  --   --  15.3* 16.1*  INR  --   --   --  1.27  --   --  1.22 1.30  HEPARINUNFRC 0.50 0.66  --   --    < > 0.75* 0.46 0.23*  CREATININE  --   --   --  1.65*  --   --  1.88* 1.84*  TROPONINI 0.04* 0.04*  --   --   --   --   --   --    < > = values in this interval not displayed.   Assessment: 71 y/o female with atrial fibrillation for Coumadin and heparin. Goal of Therapy:  Heparin level 0.3-0.7 units/ml Monitor platelets by anticoagulation protocol: Yes   Plan:   Increase Heparin 1400 units/hr Check heparin level in 8 hours.  Coumadin 10 mg today  Phillis Knack, PharmD, BCPS  06/14/2017 7:38 AM

## 2017-06-15 DIAGNOSIS — J4520 Mild intermittent asthma, uncomplicated: Secondary | ICD-10-CM

## 2017-06-15 DIAGNOSIS — E1140 Type 2 diabetes mellitus with diabetic neuropathy, unspecified: Secondary | ICD-10-CM

## 2017-06-15 DIAGNOSIS — I10 Essential (primary) hypertension: Secondary | ICD-10-CM

## 2017-06-15 DIAGNOSIS — I890 Lymphedema, not elsewhere classified: Secondary | ICD-10-CM

## 2017-06-15 DIAGNOSIS — E1165 Type 2 diabetes mellitus with hyperglycemia: Secondary | ICD-10-CM

## 2017-06-15 LAB — CBC
HEMATOCRIT: 39.8 % (ref 36.0–46.0)
Hemoglobin: 10.8 g/dL — ABNORMAL LOW (ref 12.0–15.0)
MCH: 24.8 pg — AB (ref 26.0–34.0)
MCHC: 27.1 g/dL — AB (ref 30.0–36.0)
MCV: 91.3 fL (ref 78.0–100.0)
Platelets: 254 10*3/uL (ref 150–400)
RBC: 4.36 MIL/uL (ref 3.87–5.11)
RDW: 15.7 % — AB (ref 11.5–15.5)
WBC: 7.5 10*3/uL (ref 4.0–10.5)

## 2017-06-15 LAB — GLUCOSE, CAPILLARY
GLUCOSE-CAPILLARY: 112 mg/dL — AB (ref 65–99)
GLUCOSE-CAPILLARY: 152 mg/dL — AB (ref 65–99)
GLUCOSE-CAPILLARY: 176 mg/dL — AB (ref 65–99)
Glucose-Capillary: 132 mg/dL — ABNORMAL HIGH (ref 65–99)

## 2017-06-15 LAB — PROTIME-INR
INR: 1.88
Prothrombin Time: 21.5 seconds — ABNORMAL HIGH (ref 11.4–15.2)

## 2017-06-15 LAB — AMMONIA: Ammonia: 52 umol/L — ABNORMAL HIGH (ref 9–35)

## 2017-06-15 LAB — HEPARIN LEVEL (UNFRACTIONATED): Heparin Unfractionated: 0.59 IU/mL (ref 0.30–0.70)

## 2017-06-15 MED ORDER — LACTULOSE 10 GM/15ML PO SOLN
20.0000 g | Freq: Two times a day (BID) | ORAL | Status: DC
Start: 2017-06-15 — End: 2017-06-16
  Administered 2017-06-15: 20 g via ORAL
  Filled 2017-06-15 (×3): qty 30

## 2017-06-15 MED ORDER — WARFARIN SODIUM 2.5 MG PO TABS
2.5000 mg | ORAL_TABLET | Freq: Once | ORAL | Status: AC
  Administered 2017-06-15: 2.5 mg via ORAL
  Filled 2017-06-15: qty 1

## 2017-06-15 NOTE — Progress Notes (Signed)
Physical Therapy Treatment Patient Details Name: Jodi Underwood MRN: [id number] DOB: Nov 22, 1945 Today's Date: 06/15/2017    History of Present Illness Patient is a 71 y/o female who presents with edema BLEs and SOB. Found to be in A-fib with RVR. PMH includes morbid obesity, lymphedema, A-fib, HTN, DM, asthma, neuropathy, UI.   PT Comments    Pt required maxA+2 to sit EOB this session; able to sit >10 minutes (totalA for bathing backside) before fatiguing and needing to return to supine. MaxA+3 to reposition in bed. Will continue to follow acutely to increase activity tolerance.   Follow Up Recommendations  SNF     Equipment Recommendations  Other (comment)(defer to next venue)    Recommendations for Other Services       Precautions / Restrictions Precautions Precautions: Fall Restrictions Weight Bearing Restrictions: No    Mobility  Bed Mobility Overal bed mobility: Needs Assistance Bed Mobility: Supine to Sit;Rolling Rolling: Max assist;+2 for physical assistance(+3)   Supine to sit: Max assist;+2 for physical assistance     General bed mobility comments: MaxA+2 to sit EOB; increased time and effort for all mobility. MaxA+3 (totalA) to slide up in bed and reposition. Once sitting, modA to prevent pt from sliding forward off bed. TotalA for washing back in sitting  Transfers                 General transfer comment: unable  Ambulation/Gait                 Stairs            Wheelchair Mobility    Modified Rankin (Stroke Patients Only)       Balance Overall balance assessment: Needs assistance Sitting-balance support: No upper extremity supported;Feet supported Sitting balance-Leahy Scale: Fair                                      Cognition Arousal/Alertness: Awake/alert Behavior During Therapy: WFL for tasks assessed/performed Overall Cognitive Status: Within Functional Limits for tasks assessed                                         Exercises      General Comments        Pertinent Vitals/Pain Pain Assessment: Faces Faces Pain Scale: Hurts a little bit Pain Location: Generalized  Pain Descriptors / Indicators: Discomfort;Sore Pain Intervention(s): Monitored during session;Repositioned    Home Living                      Prior Function            PT Goals (current goals can now be found in the care plan section) Acute Rehab PT Goals Patient Stated Goal: to return home PT Goal Formulation: With patient Time For Goal Achievement: 06/26/17 Potential to Achieve Goals: Fair Progress towards PT goals: Progressing toward goals    Frequency    Min 2X/week      PT Plan Current plan remains appropriate    Co-evaluation              AM-PAC PT "6 Clicks" Daily Activity  Outcome Measure  Difficulty turning over in bed (including adjusting bedclothes, sheets and blankets)?: Unable Difficulty moving from lying on back to sitting on the side of the bed? :  Unable Difficulty sitting down on and standing up from a chair with arms (e.g., wheelchair, bedside commode, etc,.)?: Unable Help needed moving to and from a bed to chair (including a wheelchair)?: Total Help needed walking in hospital room?: Total Help needed climbing 3-5 steps with a railing? : Total 6 Click Score: 6    End of Session Equipment Utilized During Treatment: Gait belt Activity Tolerance: Patient limited by fatigue Patient left: in bed;with call bell/phone within reach Nurse Communication: Mobility status;Need for lift equipment PT Visit Diagnosis: Muscle weakness (generalized) (M62.81);Pain     Time: 1420-1449 PT Time Calculation (min) (ACUTE ONLY): 29 min  Charges:  $Therapeutic Activity: 23-37 mins                    G Codes:      Jodi Underwood, PT, DPT Acute Rehab Services  Pager: [place] 06/15/2017, 3:15 PM

## 2017-06-15 NOTE — Progress Notes (Signed)
ANTICOAGULATION CONSULT NOTE  Pharmacy Consult for Heparin + warfarin Indication: atrial fibrillation  No Known Allergies  Patient Measurements: Height: 5\' 6"  (167.6 cm) Weight: (!) 421 lb (191 kg) IBW/kg (Calculated) : 59.3  Heparin dosing weight: 113 kg  Vital Signs: Temp: 97.4 F (36.3 C) (11/30 0554) Temp Source: Oral (11/30 0554) BP: 115/84 (11/30 0833) Pulse Rate: 106 (11/30 0833)  Labs: Recent Labs    06/13/17 0608 06/14/17 0451 06/14/17 1531 06/15/17 0518  HGB 11.4* 11.5*  --  10.8*  HCT 42.6 41.4  --  39.8  PLT 291 228  --  254  LABPROT 15.3* 16.1*  --  21.5*  INR 1.22 1.30  --  1.88  HEPARINUNFRC 0.46 0.23* 0.57 0.59  CREATININE 1.88* 1.84*  --   --    Assessment: 71 y/o female with new atrial fibrillation continuing on heparin + warfarin. Heparin level therapeutic x2 on heparin at 1400 units/hr.  INR made large jump this morning - up to 1.88 after one dose of 7.5mg  and two doses of 10mg . Hgb and plts overall stable, no bleeding noted.  Goal of Therapy:  Heparin level 0.3-0.7 units/ml Monitor platelets by anticoagulation protocol: Yes   Plan:   -Continue heparin at 1400 units/hr -Warfarin 2.5mg  po x1 tonight given large INR jump -anticipate to increase further tomorrow d/t last night's dose -Daily heparin level, INR, and CBC  Jessicia Napolitano D. Adiel Erney, PharmD, [place] Clinical Pharmacist Clinical Phone for 06/15/2017 until 3:30pm: [phone] If after 3:30pm, please call main pharmacy at [phone] 06/15/2017 8:45 AM

## 2017-06-15 NOTE — Clinical Social Work Note (Addendum)
CSW met with patient to discuss discharge to SNF. Patient stated she wanted to go home and requesting [facility] at home. Discussed frustrations with how uncomfortable she is. CSW provided emotional support. After further discussion, patient agreeable to SNF but wants to go to [facility] because it is across the street from her home. CSW left message for admissions coordinator. They have not extended a bed offer.  Jodi Underwood, [place] [phone]  12:03 pm CSW spoke with admissions coordinator. She will review referral and asked when she would be ready. CSW told her as soon as she could get a bariatric bed.   Jodi Underwood, [place]  1:28 pm [facility] unsure of when they will be able to get a bariatric bed and stated they would not have a room available until Monday at the earliest. Patient notified and said she will not stay here until Monday. Discussed other bed options. Patient focused on getting out of bed, working with PT, and having a BM. Nurse tech notified.  Jodi Underwood, [place] [phone]  3:17 pm [facility] has retracted their bed offer, stating that their hoyer lift can only hold 400 lbs. CSW notified patient of other three [facility] bed offers: [place], and [facility] has also extended a bed offer. Patient unable to stay awake when CSW when in to speak with her but stated that working with PT went well and gave CSW permission to call family. CSW left voicemail for daughter, Jodi Underwood. Awaiting call back.  Jodi Underwood, [place]  3:43 pm Daughter Jodi Underwood at bedside. She said her sister, Jodi Underwood is [facility] [place] at [employer] today. Hospital liaison notified.  Jodi Underwood, [facility]

## 2017-06-15 NOTE — Progress Notes (Signed)
Progress Note  Patient Name: Jodi Underwood Date of Encounter: 06/15/2017  Primary Cardiologist: new, [place]   Subjective   71 year old female with a history of super morbid obesity, diabetes mellitus, hypertension admitted with new onset atrial fibrillation. Gradually increased her metoprolol.  Her ventricular response is been well controlled.  Inpatient Medications    Scheduled Meds: . coumadin book   Does not apply Once  . fluticasone  2 spray Each Nare Daily  . furosemide  40 mg Oral Daily  . insulin aspart  0-15 Units Subcutaneous TID WC  . insulin NPH Human  30 Units Subcutaneous QHS  . lactulose  20 g Oral BID  . losartan  25 mg Oral Daily  . metoprolol tartrate  150 mg Oral BID  . polyethylene glycol  17 g Oral BID  . senna  1 tablet Oral Daily  . warfarin  2.5 mg Oral ONCE-1800  . Warfarin - Pharmacist Dosing Inpatient   Does not apply q1800   Continuous Infusions: . heparin 1,400 Units/hr (06/15/17 0630)   PRN Meds: acetaminophen **OR** acetaminophen, albuterol, camphor-menthol, ondansetron **OR** ondansetron (ZOFRAN) IV   Vital Signs    Vitals:   06/15/17 0554 06/15/17 0714 06/15/17 0833 06/15/17 1258  BP: 108/85  115/84 118/76  Pulse: (!) 104  (!) 106 98  Resp: 20   18  Temp: (!) 97.4 F (36.3 C)   (!) 97.5 F (36.4 C)  TempSrc: Oral   Oral  SpO2: 100% (!) 87%  96%  Weight: (!) 421 lb (191 kg)     Height:        Intake/Output Summary (Last 24 hours) at 06/15/2017 1359 Last data filed at 06/15/2017 0900 Gross per 24 hour  Intake 653.26 ml  Output 300 ml  Net 353.26 ml   Filed Weights   06/12/17 0413 06/13/17 1524 06/15/17 0554  Weight: (!) 450 lb (204.1 kg) (!) 428 lb (194.1 kg) (!) 421 lb (191 kg)    Telemetry    Atrial fib with well controlled ventricular response- Personally Reviewed  ECG    Atrial for ablation with well-controlled ventricular response- Personally Reviewed  Physical Exam   GEN:  Morbidly obese black  female.  No acute distress. Neck: No JVD Cardiac:  Irregularly irregular.  Very distant heart sounds Respiratory: Clear to auscultation bilaterally. GI: Soft, nontender, non-distended  MS:  Massive lymphedema Neuro:  Nonfocal  Psych: Normal affect   Labs    Chemistry Recent Labs  Lab 06/11/17 0037 06/12/17 0254 06/13/17 0608 06/14/17 0451  NA 141 139 139 139  K 4.3 4.3 4.5 4.9  CL 105 102 102 102  CO2 27 30 31 30   GLUCOSE 200* 154* 79 120*  BUN 21* 24* 29* 33*  CREATININE 1.47* 1.65* 1.88* 1.84*  CALCIUM 9.4 8.8* 8.9 8.7*  PROT 7.0  --   --   --   ALBUMIN 3.3*  --   --   --   AST 26  --   --   --   ALT 18  --   --   --   ALKPHOS 81  --   --   --   BILITOT 1.0  --   --   --   GFRNONAA 34* 30* 26* 26*  GFRAA 40* 35* 30* 30*  ANIONGAP 9 7 6 7      Hematology Recent Labs  Lab 06/13/17 0608 06/14/17 0451 06/15/17 0518  WBC 9.1 8.4 7.5  RBC 4.64 4.52 4.36  HGB 11.4*  11.5* 10.8*  HCT 42.6 41.4 39.8  MCV 91.8 91.6 91.3  MCH 24.6* 25.4* 24.8*  MCHC 26.8* 27.8* 27.1*  RDW 15.7* 16.0* 15.7*  PLT 291 228 254    Cardiac Enzymes Recent Labs  Lab 06/11/17 0423 06/11/17 0918 06/11/17 1815  TROPONINI 0.05* 0.04* 0.04*    Recent Labs  Lab 06/11/17 0053  TROPIPOC 0.02     BNP Recent Labs  Lab 06/11/17 0037  BNP 109.9*     DDimer No results for input(s): DDIMER in the last 168 hours.   Radiology    No results found.  Cardiac Studies     Patient Profile     71 y.o. female with morbid obesity, chronic lymphedema and new onset atrial fibrillation.  Assessment & Plan    1.  Atrial fibrillation with rapid ventricular response.  Her heart rate has improved with increasing doses of metoprolol. On coumadin. I doubt that we will be able to successfully cardiovert her and have her stay in [facility]. I think rate control and anticoagulation are out best strategy.   2. Morbid obesity:   She is very limited in her mobility  I would recommend SNF.      For  questions or updates, please contact [place] Please consult [email or web address] for contact info under Cardiology/STEMI.      Signed, Mertie Moores, MD  06/15/2017, 1:59 PM

## 2017-06-15 NOTE — Care Management Important Message (Signed)
Important Message  Patient Details  Name: Jodi Underwood MRN: [id number] Date of Birth: Nov 30, 1945   Medicare Important Message Given:  Yes    [name] 06/15/2017, 12:05 PM

## 2017-06-15 NOTE — Progress Notes (Signed)
Patient more alert today, with periods of confusion. Verbalized wants to go home, bed in not comfortable.

## 2017-06-15 NOTE — Progress Notes (Signed)
PROGRESS NOTE  Jodi Underwood  QQV:[id number] DOB: 1945-11-20 DOA: 06/10/2017 PCP: Guadlupe Spanish, MD   Outpatient Specialists:   Brief Narrative:  Jodi Underwood is a 71 y.o. female with medical history significant of DM type II, asthma, morbid obesity, lymphedema; who presents with complaints of of progressively worsening shortness of breath over the last 3 weeks. At baseline patient lives alone and notes that she is normally ambulatory.she complains of noticing worsening lower extremity edema despite taken her diuretics. She is not normally on oxygen but noted her oxygen saturations also were dipping as low as 73% on room air at home. Upon EMS arrival patient was noted to be in atrial fibrillation with heart rates 140 to 170s. Cardiology was consulted and rate control was improved with titration of medications including lasix. Heparin bridge to coumadin was started.   Assessment & Plan:   Principal Problem:   Atrial fibrillation with RVR (HCC) Active Problems:   DM type 2, uncontrolled, with neuropathy (HCC)   Morbid obesity (HCC)   ANEMIA-NOS   Essential hypertension   Asthma   Lymphedema  A. fib with RVR: Rate control improving with titration of metoprolol. TSH wnl.  - Heparin bridge to coumadin, will hold once therapeutic, CHA2DS2-VASc is 4. Cardiology could consider DCCV after therapeutic anticoagulation. - Monitor Mg, and K, replete as needed - Cardiology consulted appreciated  Somnolence: Remains nonfocal, and improved today. Appears to have waxing/waning course possibly consistent with delirium. Ammonia mildly elevated. - Started lactulose - Continue monitoring.  - Avoiding provocative medications.   Acute respiratory failure: Likely multifactorial, states she thinks she needed it before arrival. OHS/OSA, possibly history of asthma likely contributing.  - Wean oxygen as tolerated - Breathing treatments prn. - Continue lasix 40mg  daily  Essential  hypertension: Improved - Continue labetalol, losartan  Elevated creatinine: Based on CKD formula, CrCl is >60, elevated due to habitus.  - Monitor  Constipation:  - Continue bowel regimen  T2DM: HbA1c 7.4%. -  Reduced NPH 50u BID > 30u BID (but only given once daily) while hospitalized, suspect more moderate diet. Will continue NPH 30u qHS and moderate SSI, remains at inpatient goal.   Anemia: Hemoglobin 11.8 on admission which appears near patient's baseline. - Continue to monitor  Lymphedema: Chronic.  Morbid obesity and severe deconditioning: Body mass index is 67.95 kg/m. - Nutrition consulted - PT/OT ongoing, recommend DC to SNF. Pt requires max assist x2 to sit EOB, became fatigued in 10 minutes, requiring max assist x3 for bed repositioning.  DVT prophylaxis: Heparin > coumadin Code Status: Full Code Family Communication: None at bedside. Daughter by phone Disposition Plan: SNF recommended, awaiting bed offers and selection by family. She is medically optimized for discharge.  Procedures: Echo 06/11/17 Study Conclusions - Left ventricle: The cavity size was normal. Systolic function was normal. The estimated ejection fraction was 50%. Hypokinesis of the anteroseptal, inferior, and inferoseptal myocardium. The study is not technically sufficient to allow evaluation of LV diastolic function. - Aortic valve: Transvalvular velocity was within the normal range. There was no stenosis. There was no regurgitation. - Mitral valve: Transvalvular velocity was within the normal range. There was no evidence for stenosis. There was mild regurgitation. - Right ventricle: The cavity size was normal. Wall thickness was normal. Systolic function was normal. - Tricuspid valve: There was no regurgitation. - Pulmonary arteries: Systolic pressure was within the normal range. PA peak pressure: 18 mm Hg (S).  Impressions: - Technically difficult study with poor  visualization of  the endocardium and valves.  Consultants: Cardiology  Subjective: More alert this morning and again this afternoon. Complaining of being severely uncomfortable in bed. Denies dyspnea, palpitations, chest pain.   Objective: Vitals:   06/15/17 0554 06/15/17 0714 06/15/17 0833 06/15/17 1258  BP: 108/85  115/84 118/76  Pulse: (!) 104  (!) 106 98  Resp: 20   18  Temp: (!) 97.4 F (36.3 C)   (!) 97.5 F (36.4 C)  TempSrc: Oral   Oral  SpO2: 100% (!) 87%  96%  Weight: (!) 191 kg (421 lb)     Height:        Intake/Output Summary (Last 24 hours) at 06/15/2017 1559 Last data filed at 06/15/2017 0900 Gross per 24 hour  Intake 653.26 ml  Output 300 ml  Net 353.26 ml   Filed Weights   06/12/17 0413 06/13/17 1524 06/15/17 0554  Weight: (!) 204.1 kg (450 lb) (!) 194.1 kg (428 lb) (!) 191 kg (421 lb)    Examination: Gen: Morbidly obese, alert female in NAD HEENT: MMM, posterior oropharynx clear Pulm: Non-labored with supplemental oxygen, very distant but no crackles or wheezes  CV: Irreg irreg, no murmur appreciated; unable to evaluate JVD, significant adiposity and lymphedema limits ability to reliably determine edema, +DP pulses GI: + BS; obese, soft, non-tender, non-distended Skin: No rashes, wounds, ulcers Neuro: Alert, oriented, conversant without focal deficits or asterixis.  CBC: Recent Labs  Lab 06/11/17 0037 06/12/17 0254 06/13/17 0608 06/14/17 0451 06/15/17 0518  WBC 8.7 8.1 9.1 8.4 7.5  NEUTROABS 5.8  --   --   --   --   HGB 11.8* 11.4* 11.4* 11.5* 10.8*  HCT 41.1 41.6 42.6 41.4 39.8  MCV 85.6 89.1 91.8 91.6 91.3  PLT 261 291 291 228 341   Basic Metabolic Panel: Recent Labs  Lab 06/11/17 0037 06/11/17 0423 06/12/17 0254 06/13/17 0608 06/14/17 0451  NA 141  --  139 139 139  K 4.3  --  4.3 4.5 4.9  CL 105  --  102 102 102  CO2 27  --  30 31 30   GLUCOSE 200*  --  154* 79 120*  BUN 21*  --  24* 29* 33*  CREATININE 1.47*  --  1.65*  1.88* 1.84*  CALCIUM 9.4  --  8.8* 8.9 8.7*  MG  --  1.6*  --   --  1.9   GFR: Estimated Creatinine Clearance: 48.9 mL/min (A) (by C-G formula based on SCr of 1.84 mg/dL (H)). Liver Function Tests: Recent Labs  Lab 06/11/17 0037  AST 26  ALT 18  ALKPHOS 81  BILITOT 1.0  PROT 7.0  ALBUMIN 3.3*   Coagulation Profile: Recent Labs  Lab 06/11/17 0037 06/12/17 0254 06/13/17 0608 06/14/17 0451 06/15/17 0518  INR 1.21 1.27 1.22 1.30 1.88   Cardiac Enzymes: Recent Labs  Lab 06/11/17 0423 06/11/17 0918 06/11/17 1815  TROPONINI 0.05* 0.04* 0.04*   Urine analysis:    Component Value Date/Time   COLORURINE YELLOW 04/27/2016 2027   APPEARANCEUR CLOUDY (A) 04/27/2016 2027   LABSPEC 1.017 04/27/2016 2027   PHURINE 6.0 04/27/2016 2027   GLUCOSEU NEGATIVE 04/27/2016 2027   HGBUR MODERATE (A) 04/27/2016 2027   BILIRUBINUR NEGATIVE 04/27/2016 2027   KETONESUR NEGATIVE 04/27/2016 2027   PROTEINUR 30 (A) 04/27/2016 2027   UROBILINOGEN 0.2 09/29/2014 1224   NITRITE POSITIVE (A) 04/27/2016 2027   LEUKOCYTESUR MODERATE (A) 04/27/2016 2027   Radiology Studies: No results found.  Scheduled Meds: . coumadin  book   Does not apply Once  . fluticasone  2 spray Each Nare Daily  . furosemide  40 mg Oral Daily  . insulin aspart  0-15 Units Subcutaneous TID WC  . insulin NPH Human  30 Units Subcutaneous QHS  . lactulose  20 g Oral BID  . losartan  25 mg Oral Daily  . metoprolol tartrate  150 mg Oral BID  . polyethylene glycol  17 g Oral BID  . senna  1 tablet Oral Daily  . warfarin  2.5 mg Oral ONCE-1800  . Warfarin - Pharmacist Dosing Inpatient   Does not apply q1800   Continuous Infusions: . heparin 1,400 Units/hr (06/15/17 1536)     LOS: 4 days   Time spent: 67 min  Vance Gather, MD [facility] Pager [phone]   If 7PM-7AM, please contact night-coverage [email or web address] Password [email or web address] 06/15/2017, 3:59 PM

## 2017-06-15 NOTE — Progress Notes (Signed)
Patient complained of being uncomfortable in the Bariatric bed around 2300 last night. Staff went in and repositioned the patient to a more comfortable position. Patient is now complaining again of being uncomfortable in the bed and is threatening to leave.   Drue Flirt, RN

## 2017-06-16 LAB — CBC
HCT: 39.7 % (ref 36.0–46.0)
Hemoglobin: 11 g/dL — ABNORMAL LOW (ref 12.0–15.0)
MCH: 24.9 pg — ABNORMAL LOW (ref 26.0–34.0)
MCHC: 27.7 g/dL — AB (ref 30.0–36.0)
MCV: 89.8 fL (ref 78.0–100.0)
PLATELETS: 255 10*3/uL (ref 150–400)
RBC: 4.42 MIL/uL (ref 3.87–5.11)
RDW: 15.7 % — AB (ref 11.5–15.5)
WBC: 7.3 10*3/uL (ref 4.0–10.5)

## 2017-06-16 LAB — BASIC METABOLIC PANEL
Anion gap: 7 (ref 5–15)
BUN: 35 mg/dL — ABNORMAL HIGH (ref 6–20)
CALCIUM: 8.7 mg/dL — AB (ref 8.9–10.3)
CO2: 28 mmol/L (ref 22–32)
CREATININE: 1.77 mg/dL — AB (ref 0.44–1.00)
Chloride: 102 mmol/L (ref 101–111)
GFR calc non Af Amer: 28 mL/min — ABNORMAL LOW (ref >60)
GFR, EST AFRICAN AMERICAN: 32 mL/min — AB (ref >60)
Glucose, Bld: 152 mg/dL — ABNORMAL HIGH (ref 65–99)
Potassium: 4.9 mmol/L (ref 3.5–5.1)
SODIUM: 137 mmol/L (ref 135–145)

## 2017-06-16 LAB — GLUCOSE, CAPILLARY
GLUCOSE-CAPILLARY: 143 mg/dL — AB (ref 65–99)
GLUCOSE-CAPILLARY: 125 mg/dL — AB (ref 65–99)
Glucose-Capillary: 162 mg/dL — ABNORMAL HIGH (ref 65–99)

## 2017-06-16 LAB — PROTIME-INR
INR: 2.72
Prothrombin Time: 28.6 seconds — ABNORMAL HIGH (ref 11.4–15.2)

## 2017-06-16 LAB — HEPARIN LEVEL (UNFRACTIONATED): HEPARIN UNFRACTIONATED: 0.75 [IU]/mL — AB (ref 0.30–0.70)

## 2017-06-16 MED ORDER — SENNA 8.6 MG PO TABS: 1.0000 | ORAL_TABLET | Freq: Every day | ORAL | 0 refills | Status: DC

## 2017-06-16 MED ORDER — TRAMADOL HCL 50 MG PO TABS
50.0000 mg | ORAL_TABLET | Freq: Once | ORAL | Status: AC
  Administered 2017-06-16: 50 mg via ORAL
  Filled 2017-06-16: qty 1

## 2017-06-16 MED ORDER — METOPROLOL TARTRATE 75 MG PO TABS: 150.0000 mg | ORAL_TABLET | Freq: Two times a day (BID) | ORAL | Status: DC

## 2017-06-16 MED ORDER — LOSARTAN POTASSIUM 25 MG PO TABS: 25.0000 mg | ORAL_TABLET | Freq: Every day | ORAL | Status: DC

## 2017-06-16 MED ORDER — POLYETHYLENE GLYCOL 3350 17 G PO PACK: 17.0000 g | PACK | Freq: Two times a day (BID) | ORAL | 0 refills | Status: DC

## 2017-06-16 MED ORDER — NOVOLIN N RELION 100 UNIT/ML ~~LOC~~ SUSP: 30.0000 [IU] | Freq: Every day | SUBCUTANEOUS | 0 refills | Status: DC

## 2017-06-16 NOTE — Progress Notes (Signed)
Patient will discharge to [facility] Anticipated discharge date: 12/1 Family notified: pt dtr, [facility] Transportation by Corey Harold- scheduled for 4:45pm  CSW signing off.  Jorge Ny, LCSW Clinical Social Worker [phone]

## 2017-06-16 NOTE — Discharge Summary (Signed)
Physician Discharge Summary  Dhruvi Crenshaw ZES:[id number] DOB: 03/30/1946 DOA: 06/10/2017  PCP: Guadlupe Spanish, MD  Admit date: 06/10/2017 Discharge date: 06/16/2017  Admitted From: Home Disposition: SNF   Recommendations for Outpatient Follow-up:  1. Follow up with PCP in 1-2 weeks 2. Follow up with cardiology in about 4 weeks.  3. Follow up PT/INR, BMP, CBC in next week. Started coumadin for AFib.   Home Health: N/A Equipment/Devices: Per SNF Discharge Condition: Stable CODE STATUS: Full Diet recommendation: Heart healthy, carb-modified  Brief/Interim Summary: Jodi McCampbellis a 71 y.o.femalewith medical history significant ofDM type II, asthma, morbid obesity, lymphedema; who presents with complaints of of progressively worsening shortness of breath over the last 3 weeks. At baseline patient lives alone and notes that she is normally ambulatory.she complains of noticing worsening lower extremity edema despite taken her diuretics. She is not normally on oxygen but noted her oxygen saturations also were dipping as low as 73% on room air at home. Upon EMS arrival patient was noted to be in atrial fibrillation with heart rates 140 to 170s. Cardiology was consulted and rate control was improved with titration of medications including lasix. Heparin bridge to coumadin was started.   Discharge Diagnoses:  Principal Problem:   Atrial fibrillation with RVR ([place]) Active Problems:   DM type 2, uncontrolled, with neuropathy ([place])   Morbid obesity ([place])   ANEMIA-NOS   Essential hypertension   Asthma   Lymphedema  A. fib with UQJ:FHLK control improving with titration of metoprolol. TSH wnl.  - Heparin bridged to coumadin, therapeutic 12/1, so heparin stopped. Continue coumadin and monitoring PT/INR. CHA2DS2-VASc is 4.  - Started metoprolol, so cut down on losartan 50mg  > 25mg , BPs stable. - Cardiology could consider DCCV after therapeutic anticoagulation. - Monitor Mg, and  K, replete as needed  Somnolence: Remains nonfocal, and improved today. Appears to have waxing/waning course possibly consistent with delirium. Ammonia mildly elevated. - Started lactulose, ok to to stop.  - Continue monitoring.  - Avoiding provocative medications.   Acute respiratory failure: Likely multifactorial, states she thinks she needed it before arrival. OHS/OSA, possibly history of asthma likely contributing.  - Wean oxygen as tolerated - Breathing treatments prn. - Continue lasix 40mg  daily  Essential hypertension: Improved - Continue labetalol, losartan  Elevated creatinine: - Monitor  Constipation:  - Continue bowel regimen  T2DM: HbA1c 7.4%. -Reduced NPH 50u BID > 30u BID (but only given once daily) while hospitalized, suspect more moderate diet. Will continue NPH 30u qHS.  Anemia:Hemoglobin 11.8 on admission which appears near patient's baseline. -Continue to monitor  Lymphedema:Chronic.  Morbid obesity and severe deconditioning:Body mass index is 67.95 kg/m. - Nutrition consulted - PT/OT ongoing, recommend DC to SNF. Pt requires max assist x2 to sit EOB, became fatigued in 10 minutes, requiring max assist x3 for bed repositioning.  Discharge Instructions Discharge Instructions    Diet - low sodium heart healthy   Complete by:  As directed    Diet Carb Modified   Complete by:  As directed      Allergies as of 06/16/2017   No Known Allergies     Medication List    STOP taking these medications   insulin aspart 100 UNIT/ML injection Commonly known as:  novoLOG   naproxen sodium 220 MG tablet Commonly known as:  ALEVE   oxyCODONE-acetaminophen 5-325 MG tablet Commonly known as:  PERCOCET/ROXICET     TAKE these medications   albuterol 108 (90 Base) MCG/ACT inhaler Commonly known as:  PROVENTIL HFA;VENTOLIN HFA Inhale 2 puffs into the lungs every 6 (six) hours as needed.   fluticasone 220 MCG/ACT inhaler Commonly known as:   FLOVENT HFA Inhale 2 puffs into the lungs 2 (two) times daily.   fluticasone 50 MCG/ACT nasal spray Commonly known as:  FLONASE Place 2 sprays into both nostrils daily.   furosemide 40 MG tablet Commonly known as:  LASIX Take 40 mg by mouth daily.   ipratropium-albuterol 0.5-2.5 (3) MG/3ML Soln Commonly known as:  DUONEB Take 3 mLs by nebulization every 6 (six) hours as needed.   losartan 25 MG tablet Commonly known as:  COZAAR Take 1 tablet (25 mg total) by mouth daily. What changed:    medication strength  how much to take   Metoprolol Tartrate 75 MG Tabs Take 150 mg by mouth 2 (two) times daily.   NOVOLIN N RELION 100 UNIT/ML injection Generic drug:  insulin NPH Human Inject 0.3 mLs (30 Units total) into the skin at bedtime. What changed:    how much to take  when to take this   nystatin cream Commonly known as:  MYCOSTATIN Apply 1 application topically as needed.   polyethylene glycol packet Commonly known as:  MIRALAX / GLYCOLAX Take 17 g by mouth 2 (two) times daily.   senna 8.6 MG Tabs tablet Commonly known as:  SENOKOT Take 1 tablet (8.6 mg total) by mouth daily. Start taking on:  06/17/2017   ZZZQUIL 25 MG Caps Generic drug:  DiphenhydrAMINE HCl (Sleep) Take 50 mg by mouth at bedtime as needed (sleep).       Contact information for follow-up providers    Arvind, Aldean Baker, MD. Schedule an appointment as soon as possible for a visit.   Specialty:  Internal Medicine Contact information: [place] [address] [phone]        Nahser, Wonda Cheng, MD. Call in 1 month(s).   Specialty:  Cardiology Contact information: [address] [place] [address] [phone]            Contact information for after-discharge care    Destination    [facility] Follow up.   Service:  Skilled Nursing Contact information: [address] [place]  [place] [phone]                 No Known Allergies  Consultations:  Cardiology  Procedures/Studies: Dg Chest Port 1 View  Result Date: 06/11/2017 CLINICAL DATA:  Shortness of breath tonight. EXAM: PORTABLE CHEST 1 VIEW COMPARISON:  10/19/2016 FINDINGS: Examination is technically limited due to patient's body habitus. Cardiac enlargement. No vascular congestion. Lungs appear clear and expanded. No blunting of costophrenic angles. No pneumothorax. Unchanged appearance since previous study. IMPRESSION: Cardiac enlargement.  No evidence of active pulmonary disease. Electronically Signed   By: Lucienne Capers M.D.   On: 06/11/2017 01:21   Echo 06/11/17 Study Conclusions - Left ventricle: The cavity size was normal. Systolic function was normal. The estimated ejection fraction was 50%. Hypokinesis of the anteroseptal, inferior, and inferoseptal myocardium. The study is not technically sufficient to allow evaluation of LV diastolic function. - Aortic valve: Transvalvular velocity was within the normal range. There was no stenosis. There was no regurgitation. - Mitral valve: Transvalvular velocity was within the normal range. There was no evidence for stenosis. There was mild regurgitation. - Right ventricle: The cavity size was normal. Wall thickness was normal. Systolic function was normal. - Tricuspid valve:  There was no regurgitation. - Pulmonary arteries: Systolic pressure was within the normal range. PA peak pressure: 18 mm Hg (S).  Impressions: - Technically difficult study with poor visualization of the endocardium and valves.  Subjective: No events overnight, rate is controlled. Somnolence has resolved. Refusing any more lactulose. Wants to get out of bed more often. Discussed case in detail with patient and 2 daughters (one present at bedside and one on speakerphone in the room)   Discharge Exam: BP 109/70 (BP Location: Right Wrist)   Pulse  94   Temp 97.9 F (36.6 C) (Oral)   Resp 18   Ht 5\' 6"  (1.676 m)   Wt (!) 190.4 kg (419 lb 12.1 oz)   SpO2 99%   BMI 67.75 kg/m   Gen: Morbidly obese, alert female in NAD HEENT: MMM, posterior oropharynx clear Pulm: Non-labored with supplemental oxygen, very distant but no crackles or wheezes  CV: Irreg irreg, no murmur appreciated; unable to evaluate JVD, significant adiposity and lymphedema limits ability to reliably determine edema, +DP pulses GI: + BS; obese, soft, non-tender, non-distended Skin: No rashes, wounds, ulcers Neuro: Alert, oriented, conversant without focal deficits or asterixis.  Labs: BNP (last 3 results) Recent Labs    06/11/17 0037  BNP 324.4*   Basic Metabolic Panel: Recent Labs  Lab 06/11/17 0037 06/11/17 0423 06/12/17 0254 06/13/17 0608 06/14/17 0451 06/16/17 0928  NA 141  --  139 139 139 137  K 4.3  --  4.3 4.5 4.9 4.9  CL 105  --  102 102 102 102  CO2 27  --  30 31 30 28   GLUCOSE 200*  --  154* 79 120* 152*  BUN 21*  --  24* 29* 33* 35*  CREATININE 1.47*  --  1.65* 1.88* 1.84* 1.77*  CALCIUM 9.4  --  8.8* 8.9 8.7* 8.7*  MG  --  1.6*  --   --  1.9  --    Liver Function Tests: Recent Labs  Lab 06/11/17 0037  AST 26  ALT 18  ALKPHOS 81  BILITOT 1.0  PROT 7.0  ALBUMIN 3.3*   Recent Labs  Lab 06/15/17 0519  AMMONIA 52*   CBC: Recent Labs  Lab 06/11/17 0037 06/12/17 0254 06/13/17 0608 06/14/17 0451 06/15/17 0518 06/16/17 0928  WBC 8.7 8.1 9.1 8.4 7.5 7.3  NEUTROABS 5.8  --   --   --   --   --   HGB 11.8* 11.4* 11.4* 11.5* 10.8* 11.0*  HCT 41.1 41.6 42.6 41.4 39.8 39.7  MCV 85.6 89.1 91.8 91.6 91.3 89.8  PLT 261 291 291 228 254 255   Cardiac Enzymes: Recent Labs  Lab 06/11/17 0423 06/11/17 0918 06/11/17 1815  TROPONINI 0.05* 0.04* 0.04*   CBG: Recent Labs  Lab 06/15/17 1240 06/15/17 1715 06/15/17 2115 06/16/17 0730 06/16/17 1210  GLUCAP 132* 152* 176* 143* 162*    Time coordinating discharge:  Approximately 40 minutes  Vance Gather, MD  [facility] 06/16/2017, 4:17 PM Pager [phone]

## 2017-06-16 NOTE — Clinical Social Work Placement (Signed)
   CLINICAL SOCIAL WORK PLACEMENT  NOTE  Date:  06/16/2017  Patient Details  Name: Jodi Underwood MRN: [id number] Date of Birth: 12-31-1945  Clinical Social Work is seeking post-discharge placement for this patient at the [place] level of care (*CSW will initial, date and re-position this form in  chart as items are completed):  Yes   Patient/family provided with [facility] Work Department's list of facilities offering this level of care within the geographic area requested by the patient (or if unable, by the patient's family).  Yes   Patient/family informed of their freedom to choose among providers that offer the needed level of care, that participate in Medicare, Medicaid or managed care program needed by the patient, have an available bed and are willing to accept the patient.  Yes   Patient/family informed of [facility]'s ownership interest in [facility] and [facility], as well as of the fact that they are under no obligation to receive care at these facilities.  PASRR submitted to EDS on 07/13/17     PASRR number received on 07/13/17     Existing PASRR number confirmed on       FL2 transmitted to all facilities in geographic area requested by pt/family on 07/13/17     FL2 transmitted to all facilities within larger geographic area on       Patient informed that his/her managed care company has contracts with or will negotiate with certain facilities, including the following:        Yes   Patient/family informed of bed offers received.  Patient chooses bed at [facility]     Physician recommends and patient chooses bed at      Patient to be transferred to [facility] on 06/16/17.  Patient to be transferred to facility by ptar     Patient family notified on 06/16/17 of transfer.  Name of family member notified:  Tricia     PHYSICIAN Please sign FL2      Additional Comment:    _______________________________________________ Jorge Ny, LCSW 06/16/2017, 4:17 PM

## 2017-06-16 NOTE — Progress Notes (Signed)
Subjective:  Lying in bed somnolent with no complaints of SOB or chest pain  Objective:  Vital Signs in the last 24 hours: BP 109/70 (BP Location: Right Wrist)   Pulse 94   Temp 97.9 F (36.6 C) (Oral)   Resp 18   Ht 5\' 6"  (1.676 m)   Wt (!) 190.4 kg (419 lb 12.1 oz)   SpO2 99%   BMI 67.75 kg/m   Physical Exam:  Severely obese BF in NAD Lungs:  Clear  Cardiac:  irregular rhythm, normal S1 and S2, no S3 Extremities: massive lymphedema present  Intake/Output from previous day: 11/30 0701 - 12/01 0700 In: 630 [P.O.:630] Out: 200 [Urine:200] Weight Filed Weights   06/13/17 1524 06/15/17 0554 06/16/17 0530  Weight: (!) 194.1 kg (428 lb) (!) 191 kg (421 lb) (!) 190.4 kg (419 lb 12.1 oz)    Lab Results: Basic Metabolic Panel: Recent Labs    06/14/17 0451 06/16/17 0928  NA 139 137  K 4.9 4.9  CL 102 102  CO2 30 28  GLUCOSE 120* 152*  BUN 33* 35*  CREATININE 1.84* 1.77*    CBC: Recent Labs    06/15/17 0518 06/16/17 0928  WBC 7.5 7.3  HGB 10.8* 11.0*  HCT 39.8 39.7  MCV 91.3 89.8  PLT 254 255    BNP    Component Value Date/Time   BNP 109.9 (H) 06/11/2017 0037   Telemetry: Atrial fibrillation with controlled response  Assessment/Plan:  1. Persistent atrial fibrillation rate controlled 2. Severe morbid obesity  Continue anticoagulation and rate control.        Kerry Hough  MD [facility] Cardiology  06/16/2017, 1:30 PM

## 2017-06-16 NOTE — Plan of Care (Signed)
  Clinical Measurements: Respiratory complications will improve 06/16/2017 0347 - Progressing by Sobia Karger A, RN   Nutrition: Adequate nutrition will be maintained 06/16/2017 0347 - Progressing by Renwick Asman, Roma Kayser, RN

## 2017-06-16 NOTE — Progress Notes (Addendum)
Addendum:  INR this morning is therapeutic 2.72 after another large increase (1.3>1.8>2.7). Spoke to provider who would like to stop the heparin infusion at this time. Expect this value to increase again. Will hold the dose tonight and evaluate tomorrow's INR.  Georga Bora, PharmD Clinical Pharmacist 06/16/2017 12:14 PM  _________________________________________________________________________________   [facility] NOTE  Pharmacy Consult for Heparin + warfarin Indication: atrial fibrillation  No Known Allergies  Patient Measurements: Height: 5\' 6"  (167.6 cm) Weight: (!) 419 lb 12.1 oz (190.4 kg) IBW/kg (Calculated) : 59.3  Heparin dosing weight: 113 kg  Vital Signs: Temp: 97.8 F (36.6 C) (12/01 0530) Temp Source: Axillary (12/01 0530) BP: 113/82 (12/01 0530) Pulse Rate: 112 (12/01 0530)  Labs: Recent Labs    06/14/17 0451 06/14/17 1531 06/15/17 0518 06/16/17 0928  HGB 11.5*  --  10.8* 11.0*  HCT 41.4  --  39.8 39.7  PLT 228  --  254 255  LABPROT 16.1*  --  21.5* 28.6*  INR 1.30  --  1.88 2.72  HEPARINUNFRC 0.23* 0.57 0.59 0.75*  CREATININE 1.84*  --   --  1.77*   Assessment: 71 y/o female with new atrial fibrillation continuing on heparin + warfarin. Heparin level supra-therapeutic: 0.75   Patient refused labs this morning and rescheduled INR still pending: noted large INR jump yesterday - up to 1.88 after one dose of 7.5mg  and two doses of 10mg . Hgb and plts overall stable, NO bleeding reported by RN  Goal of Therapy:  Heparin level 0.3-0.7 units/ml Monitor platelets by anticoagulation protocol: Yes   Plan:   -Reduce heparin gtt to 1250 units/hr -Warfarin will be dosed after INR results -Heparin level at 2000 tonight -Daily heparin level, INR, and CBC -Monitor for s/sx of bleeding  Georga Bora, PharmD Clinical Pharmacist 06/16/2017 11:52 AM

## 2017-07-23 ENCOUNTER — Ambulatory Visit: Payer: Medicare Other | Admitting: Physician Assistant

## 2017-07-23 NOTE — Progress Notes (Deleted)
Cardiology Office Note    Date:  07/23/2017   ID:  Jodi Underwood, DOB October 28, 1945, MRN [id number]  PCP:  Jodi Spanish, MD  Cardiologist: Dr. Acie Fredrickson  Chief Complaint: Hospital follow up for afib  History of Present Illness:   Jodi Underwood is a 72 y.o. female  with a history of super morbid obesity, diabetes mellitus, hypertension presented for follow up.   Admitted with new onset atrial fibrillation 05/2017. Plan for rate control strategy. Anticoagulated with coumadin. Echo showed LVEF of 50%.    Here today for follow up.    Past Medical History:  Diagnosis Date  . Anemia   . Arthritis   . Asthma   . Atrial fibrillation ([place])   . Degenerative disc disease   . Diabetes mellitus   . Diabetic neuropathy ([place])   . Edema extremities   . Hypertension   . Lymphedema   . Pneumonia   . Urinary incontinence     Past Surgical History:  Procedure Laterality Date  . ABDOMINAL HYSTERECTOMY     COMPLETE    Current Medications: Prior to Admission medications   Medication Sig Start Date End Date Taking? Authorizing Provider  albuterol (PROVENTIL HFA;VENTOLIN HFA) 108 (90 Base) MCG/ACT inhaler Inhale 2 puffs into the lungs every 6 (six) hours as needed. 10/20/16   Doreatha Lew, MD  DiphenhydrAMINE HCl, Sleep, (ZZZQUIL) 25 MG CAPS Take 50 mg by mouth at bedtime as needed (sleep).    [provider]  fluticasone (FLONASE) 50 MCG/ACT nasal spray Place 2 sprays into both nostrils daily. 10/20/16   Patrecia Pour, Christean Grief, MD  fluticasone (FLOVENT HFA) 220 MCG/ACT inhaler Inhale 2 puffs into the lungs 2 (two) times daily. Patient not taking: Reported on 06/11/2017 10/20/16   Patrecia Pour, Christean Grief, MD  furosemide (LASIX) 40 MG tablet Take 40 mg by mouth daily. 05/22/17   [provider]  ipratropium-albuterol (DUONEB) 0.5-2.5 (3) MG/3ML SOLN Take 3 mLs by nebulization every 6 (six) hours as needed. Patient not taking: Reported on 06/11/2017 10/20/16   Patrecia Pour, Christean Grief, MD  losartan (COZAAR) 25 MG tablet Take 1 tablet (25 mg total) by mouth daily. 06/16/17   Patrecia Pour, MD  Metoprolol Tartrate 75 MG TABS Take 150 mg by mouth 2 (two) times daily. 06/16/17   Patrecia Pour, MD  NOVOLIN N RELION 100 UNIT/ML injection Inject 0.3 mLs (30 Units total) into the skin at bedtime. 06/16/17   Patrecia Pour, MD  nystatin cream (MYCOSTATIN) Apply 1 application topically as needed. 03/27/17   [provider]  polyethylene glycol (MIRALAX / GLYCOLAX) packet Take 17 g by mouth 2 (two) times daily. 06/16/17   Patrecia Pour, MD  senna (SENOKOT) 8.6 MG TABS tablet Take 1 tablet (8.6 mg total) by mouth daily. 06/17/17   Patrecia Pour, MD    Allergies:   Patient has no known allergies.   Social History   Socioeconomic History  . Marital status: Divorced    Spouse name: Not on file  . Number of children: Not on file  . Years of education: Not on file  . Highest education level: Not on file  Social Needs  . Financial resource strain: Not on file  . Food insecurity - worry: Not on file  . Food insecurity - inability: Not on file  . Transportation needs - medical: Not on file  . Transportation needs - non-medical: Not on file  Occupational History  . Not on file  Tobacco Use  . Smoking status: Never Smoker  . Smokeless tobacco: Never Used  Substance and Sexual Activity  . Alcohol use: Yes    Comment: occassionally  . Drug use: No  . Sexual activity: Not on file  Other Topics Concern  . Not on file  Social History Narrative  . Not on file     Family History:  The patient's family history is not on file. She was adopted. ***  ROS:   Please see the history of present illness.    ROS All other systems reviewed and are negative.   PHYSICAL EXAM:   VS:  There were no vitals taken for this visit.   GEN: Well nourished, well developed, in no acute distress  HEENT: normal  Neck: no JVD, carotid bruits, or masses Cardiac: ***RRR; no murmurs,  rubs, or gallops,no edema  Respiratory:  clear to auscultation bilaterally, normal work of breathing GI: soft, nontender, nondistended, + BS MS: no deformity or atrophy  Skin: warm and dry, no rash Neuro:  Alert and Oriented x 3, Strength and sensation are intact Psych: euthymic mood, full affect  Wt Readings from Last 3 Encounters:  06/16/17 (!) 419 lb 12.1 oz (190.4 kg)  10/13/16 (!) 449 lb 15.3 oz (204.1 kg)  04/28/16 (!) 442 lb (200.5 kg)      Studies/Labs Reviewed:   EKG:  EKG is ordered today.  The ekg ordered today demonstrates ***  Recent Labs: 06/11/2017: ALT 18; B Natriuretic Peptide 109.9; TSH 2.348 06/14/2017: Magnesium 1.9 06/16/2017: BUN 35; Creatinine, Ser 1.77; Hemoglobin 11.0; Platelets 255; Potassium 4.9; Sodium 137   Lipid Panel    Component Value Date/Time   CHOL 146 09/26/2014 1542   TRIG 147 09/26/2014 1542   HDL 33 (L) 09/26/2014 1542   CHOLHDL 4.4 09/26/2014 1542   VLDL 29 09/26/2014 1542   LDLCALC 84 09/26/2014 1542   LDLDIRECT 129.3 06/23/2010 0909    Additional studies/ records that were reviewed today include:   Echocardiogram: 06/11/17 Study Conclusions  - Left ventricle: The cavity size was normal. Systolic function was   normal. The estimated ejection fraction was 50%. Hypokinesis of   the anteroseptal, inferior, and inferoseptal myocardium. The   study is not technically sufficient to allow evaluation of LV   diastolic function. - Aortic valve: Transvalvular velocity was within the normal range.   There was no stenosis. There was no regurgitation. - Mitral valve: Transvalvular velocity was within the normal range.   There was no evidence for stenosis. There was mild regurgitation. - Right ventricle: The cavity size was normal. Wall thickness was   normal. Systolic function was normal. - Tricuspid valve: There was no regurgitation. - Pulmonary arteries: Systolic pressure was within the normal   range. PA peak pressure: 18 mm Hg  (S).  Impressions:  - Technically difficult study with poor visualization of the   endocardium and valves.     ASSESSMENT & PLAN:    1. ***    Medication Adjustments/Labs and Tests Ordered: Current medicines are reviewed at length with the patient today.  Concerns regarding medicines are outlined above.  Medication changes, Labs and Tests ordered today are listed in the Patient Instructions below. There are no Patient Instructions on file for this visit.   Jarrett Soho, [place]  07/23/2017 10:49 AM    [facility] [address], [place], [place]  [address] Phone: [phone]; Fax: [phone]

## 2017-08-14 NOTE — Progress Notes (Deleted)
Cardiology Office Note    Date:  08/14/2017   ID:  Jodi Underwood, DOB [birth year], MRN [id number]  PCP:  Guadlupe Spanish, MD  Cardiologist:  Dr. Acie Fredrickson   Chief Complaint: Hospital follow up   History of Present Illness:   Jodi Underwood is a 72 y.o. female with a history of super morbid obesity, diabetes mellitus, hypertension admitted 05/2017 with new onset atrial fibrillation now presents for follow up. Her heart rate has improved with increasing doses of metoprolol. On coumadin for anticoagulation. It was felt that we will not be able to successfully cardiovert her and have her stay in NSR. Rate control and anticoagulation are our best strategy.    Here today for follow up.     Past Medical History:  Diagnosis Date  . Anemia   . Arthritis   . Asthma   . Atrial fibrillation ([place])   . Degenerative disc disease   . Diabetes mellitus   . Diabetic neuropathy ([place])   . Edema extremities   . Hypertension   . Lymphedema   . Pneumonia   . Urinary incontinence     Past Surgical History:  Procedure Laterality Date  . ABDOMINAL HYSTERECTOMY     COMPLETE    Current Medications: Prior to Admission medications   Medication Sig Start Date End Date Taking? Authorizing Provider  albuterol (PROVENTIL HFA;VENTOLIN HFA) 108 (90 Base) MCG/ACT inhaler Inhale 2 puffs into the lungs every 6 (six) hours as needed. 10/20/16   Doreatha Lew, MD  DiphenhydrAMINE HCl, Sleep, (ZZZQUIL) 25 MG CAPS Take 50 mg by mouth at bedtime as needed (sleep).    [provider]  fluticasone (FLONASE) 50 MCG/ACT nasal spray Place 2 sprays into both nostrils daily. 10/20/16   Patrecia Pour, Christean Grief, MD  fluticasone (FLOVENT HFA) 220 MCG/ACT inhaler Inhale 2 puffs into the lungs 2 (two) times daily. Patient not taking: Reported on 06/11/2017 10/20/16   Patrecia Pour, Christean Grief, MD  furosemide (LASIX) 40 MG tablet Take 40 mg by mouth daily. 05/22/17   [provider]  ipratropium-albuterol  (DUONEB) 0.5-2.5 (3) MG/3ML SOLN Take 3 mLs by nebulization every 6 (six) hours as needed. Patient not taking: Reported on 06/11/2017 10/20/16   Patrecia Pour, Christean Grief, MD  losartan (COZAAR) 25 MG tablet Take 1 tablet (25 mg total) by mouth daily. 06/16/17   Patrecia Pour, MD  Metoprolol Tartrate 75 MG TABS Take 150 mg by mouth 2 (two) times daily. 06/16/17   Patrecia Pour, MD  NOVOLIN N RELION 100 UNIT/ML injection Inject 0.3 mLs (30 Units total) into the skin at bedtime. 06/16/17   Patrecia Pour, MD  nystatin cream (MYCOSTATIN) Apply 1 application topically as needed. 03/27/17   [provider]  polyethylene glycol (MIRALAX / GLYCOLAX) packet Take 17 g by mouth 2 (two) times daily. 06/16/17   Patrecia Pour, MD  senna (SENOKOT) 8.6 MG TABS tablet Take 1 tablet (8.6 mg total) by mouth daily. 06/17/17   Patrecia Pour, MD    Allergies:   Patient has no known allergies.   Social History   Socioeconomic History  . Marital status: Divorced    Spouse name: Not on file  . Number of children: Not on file  . Years of education: Not on file  . Highest education level: Not on file  Social Needs  . Financial resource strain: Not on file  . Food insecurity - worry: Not on file  . Food insecurity - inability: Not on  file  . Transportation needs - medical: Not on file  . Transportation needs - non-medical: Not on file  Occupational History  . Not on file  Tobacco Use  . Smoking status: Never Smoker  . Smokeless tobacco: Never Used  Substance and Sexual Activity  . Alcohol use: Yes    Comment: occassionally  . Drug use: No  . Sexual activity: Not on file  Other Topics Concern  . Not on file  Social History Narrative  . Not on file     Family History:  The patient's family history is not on file. She was adopted. ***  ROS:   Please see the history of present illness.    ROS All other systems reviewed and are negative.   PHYSICAL EXAM:   VS:  There were no vitals taken for this visit.     GEN: Well nourished, well developed, in no acute distress  HEENT: normal  Neck: no JVD, carotid bruits, or masses Cardiac: ***RRR; no murmurs, rubs, or gallops,no edema  Respiratory:  clear to auscultation bilaterally, normal work of breathing GI: soft, nontender, nondistended, + BS MS: no deformity or atrophy  Skin: warm and dry, no rash Neuro:  Alert and Oriented x 3, Strength and sensation are intact Psych: euthymic mood, full affect  Wt Readings from Last 3 Encounters:  06/16/17 (!) 419 lb 12.1 oz (190.4 kg)  10/13/16 (!) 449 lb 15.3 oz (204.1 kg)  04/28/16 (!) 442 lb (200.5 kg)      Studies/Labs Reviewed:   EKG:  EKG is ordered today.  The ekg ordered today demonstrates ***  Recent Labs: 06/11/2017: ALT 18; B Natriuretic Peptide 109.9; TSH 2.348 06/14/2017: Magnesium 1.9 06/16/2017: BUN 35; Creatinine, Ser 1.77; Hemoglobin 11.0; Platelets 255; Potassium 4.9; Sodium 137   Lipid Panel    Component Value Date/Time   CHOL 146 09/26/2014 1542   TRIG 147 09/26/2014 1542   HDL 33 (L) 09/26/2014 1542   CHOLHDL 4.4 09/26/2014 1542   VLDL 29 09/26/2014 1542   LDLCALC 84 09/26/2014 1542   LDLDIRECT 129.3 06/23/2010 0909    Additional studies/ records that were reviewed today include:   Echocardiogram: 05/2017 Study Conclusions  - Left ventricle: The cavity size was normal. Systolic function was   normal. The estimated ejection fraction was 50%. Hypokinesis of   the anteroseptal, inferior, and inferoseptal myocardium. The   study is not technically sufficient to allow evaluation of LV   diastolic function. - Aortic valve: Transvalvular velocity was within the normal range.   There was no stenosis. There was no regurgitation. - Mitral valve: Transvalvular velocity was within the normal range.   There was no evidence for stenosis. There was mild regurgitation. - Right ventricle: The cavity size was normal. Wall thickness was   normal. Systolic function was normal. -  Tricuspid valve: There was no regurgitation. - Pulmonary arteries: Systolic pressure was within the normal   range. PA peak pressure: 18 mm Hg (S).  Impressions:  - Technically difficult study with poor visualization of the   endocardium and valves.    ASSESSMENT & PLAN:    1. Persistent atrial fibrillaltion - Dr. Acie Fredrickson felt rate control strategies would be best for her.  2. HTN   Medication Adjustments/Labs and Tests Ordered: Current medicines are reviewed at length with the patient today.  Concerns regarding medicines are outlined above.  Medication changes, Labs and Tests ordered today are listed in the Patient Instructions below. There are no Patient Instructions on  file for this visit.   Jarrett Soho, [place]  08/14/2017 10:41 AM    [facility] [place], [place], [place]  [address] Phone: [phone]; Fax: [phone]

## 2017-08-16 ENCOUNTER — Ambulatory Visit: Payer: Medicare Other | Admitting: Physician Assistant

## 2017-11-09 DIAGNOSIS — I482 Chronic atrial fibrillation: Secondary | ICD-10-CM | POA: Diagnosis not present

## 2017-11-09 DIAGNOSIS — N186 End stage renal disease: Secondary | ICD-10-CM | POA: Diagnosis not present

## 2017-11-09 DIAGNOSIS — J9620 Acute and chronic respiratory failure, unspecified whether with hypoxia or hypercapnia: Secondary | ICD-10-CM | POA: Diagnosis not present

## 2017-11-09 DIAGNOSIS — G4733 Obstructive sleep apnea (adult) (pediatric): Secondary | ICD-10-CM | POA: Diagnosis not present

## 2017-11-11 DIAGNOSIS — N186 End stage renal disease: Secondary | ICD-10-CM | POA: Diagnosis not present

## 2017-11-11 DIAGNOSIS — G4733 Obstructive sleep apnea (adult) (pediatric): Secondary | ICD-10-CM | POA: Diagnosis not present

## 2017-11-11 DIAGNOSIS — I482 Chronic atrial fibrillation: Secondary | ICD-10-CM | POA: Diagnosis not present

## 2017-11-11 DIAGNOSIS — J9620 Acute and chronic respiratory failure, unspecified whether with hypoxia or hypercapnia: Secondary | ICD-10-CM | POA: Diagnosis not present

## 2017-11-12 DIAGNOSIS — G4733 Obstructive sleep apnea (adult) (pediatric): Secondary | ICD-10-CM | POA: Diagnosis not present

## 2017-11-12 DIAGNOSIS — I482 Chronic atrial fibrillation: Secondary | ICD-10-CM | POA: Diagnosis not present

## 2017-11-12 DIAGNOSIS — J9620 Acute and chronic respiratory failure, unspecified whether with hypoxia or hypercapnia: Secondary | ICD-10-CM | POA: Diagnosis not present

## 2017-11-12 DIAGNOSIS — N186 End stage renal disease: Secondary | ICD-10-CM | POA: Diagnosis not present

## 2017-11-13 DIAGNOSIS — J9620 Acute and chronic respiratory failure, unspecified whether with hypoxia or hypercapnia: Secondary | ICD-10-CM | POA: Diagnosis not present

## 2017-11-13 DIAGNOSIS — N186 End stage renal disease: Secondary | ICD-10-CM | POA: Diagnosis not present

## 2017-11-13 DIAGNOSIS — G4733 Obstructive sleep apnea (adult) (pediatric): Secondary | ICD-10-CM | POA: Diagnosis not present

## 2017-11-13 DIAGNOSIS — I482 Chronic atrial fibrillation: Secondary | ICD-10-CM | POA: Diagnosis not present

## 2017-11-14 DIAGNOSIS — J9620 Acute and chronic respiratory failure, unspecified whether with hypoxia or hypercapnia: Secondary | ICD-10-CM | POA: Diagnosis not present

## 2017-11-14 DIAGNOSIS — G4733 Obstructive sleep apnea (adult) (pediatric): Secondary | ICD-10-CM | POA: Diagnosis not present

## 2017-11-14 DIAGNOSIS — I482 Chronic atrial fibrillation: Secondary | ICD-10-CM | POA: Diagnosis not present

## 2017-11-14 DIAGNOSIS — N186 End stage renal disease: Secondary | ICD-10-CM | POA: Diagnosis not present

## 2017-11-15 DIAGNOSIS — N186 End stage renal disease: Secondary | ICD-10-CM | POA: Diagnosis not present

## 2017-11-15 DIAGNOSIS — J9620 Acute and chronic respiratory failure, unspecified whether with hypoxia or hypercapnia: Secondary | ICD-10-CM | POA: Diagnosis not present

## 2017-11-15 DIAGNOSIS — G4733 Obstructive sleep apnea (adult) (pediatric): Secondary | ICD-10-CM | POA: Diagnosis not present

## 2017-11-15 DIAGNOSIS — I482 Chronic atrial fibrillation: Secondary | ICD-10-CM | POA: Diagnosis not present

## 2017-11-16 DIAGNOSIS — J9620 Acute and chronic respiratory failure, unspecified whether with hypoxia or hypercapnia: Secondary | ICD-10-CM | POA: Diagnosis not present

## 2017-11-16 DIAGNOSIS — N186 End stage renal disease: Secondary | ICD-10-CM | POA: Diagnosis not present

## 2017-11-16 DIAGNOSIS — G4733 Obstructive sleep apnea (adult) (pediatric): Secondary | ICD-10-CM | POA: Diagnosis not present

## 2017-11-16 DIAGNOSIS — I482 Chronic atrial fibrillation: Secondary | ICD-10-CM | POA: Diagnosis not present

## 2017-11-17 DIAGNOSIS — G4733 Obstructive sleep apnea (adult) (pediatric): Secondary | ICD-10-CM | POA: Diagnosis not present

## 2017-11-17 DIAGNOSIS — J9620 Acute and chronic respiratory failure, unspecified whether with hypoxia or hypercapnia: Secondary | ICD-10-CM | POA: Diagnosis not present

## 2017-11-17 DIAGNOSIS — N186 End stage renal disease: Secondary | ICD-10-CM | POA: Diagnosis not present

## 2017-11-17 DIAGNOSIS — I482 Chronic atrial fibrillation: Secondary | ICD-10-CM | POA: Diagnosis not present

## 2017-11-18 DIAGNOSIS — G4733 Obstructive sleep apnea (adult) (pediatric): Secondary | ICD-10-CM | POA: Diagnosis not present

## 2017-11-18 DIAGNOSIS — N186 End stage renal disease: Secondary | ICD-10-CM | POA: Diagnosis not present

## 2017-11-18 DIAGNOSIS — J9620 Acute and chronic respiratory failure, unspecified whether with hypoxia or hypercapnia: Secondary | ICD-10-CM | POA: Diagnosis not present

## 2017-11-18 DIAGNOSIS — I482 Chronic atrial fibrillation: Secondary | ICD-10-CM | POA: Diagnosis not present

## 2017-12-20 ENCOUNTER — Inpatient Hospital Stay (HOSPITAL_COMMUNITY): Payer: Medicare Other

## 2017-12-20 ENCOUNTER — Emergency Department (HOSPITAL_COMMUNITY): Payer: Medicare Other

## 2017-12-20 ENCOUNTER — Inpatient Hospital Stay (HOSPITAL_COMMUNITY)
Admission: EM | Admit: 2017-12-20 | Discharge: 2017-12-28 | DRG: 683 | Disposition: A | Discharge status: Skilled Nursing Facility | Payer: Medicare Other | Attending: Internal Medicine | Admitting: Internal Medicine

## 2017-12-20 ENCOUNTER — Encounter (HOSPITAL_COMMUNITY): Payer: Self-pay | Admitting: *Deleted

## 2017-12-20 ENCOUNTER — Other Ambulatory Visit: Payer: Self-pay

## 2017-12-20 DIAGNOSIS — E861 Hypovolemia: Secondary | ICD-10-CM | POA: Diagnosis present

## 2017-12-20 DIAGNOSIS — I10 Essential (primary) hypertension: Secondary | ICD-10-CM | POA: Diagnosis not present

## 2017-12-20 DIAGNOSIS — I5022 Chronic systolic (congestive) heart failure: Secondary | ICD-10-CM | POA: Diagnosis present

## 2017-12-20 DIAGNOSIS — R400 Somnolence: Secondary | ICD-10-CM | POA: Diagnosis present

## 2017-12-20 DIAGNOSIS — Z881 Allergy status to other antibiotic agents status: Secondary | ICD-10-CM

## 2017-12-20 DIAGNOSIS — N183 Chronic kidney disease, stage 3 (moderate): Secondary | ICD-10-CM

## 2017-12-20 DIAGNOSIS — K429 Umbilical hernia without obstruction or gangrene: Secondary | ICD-10-CM | POA: Diagnosis present

## 2017-12-20 DIAGNOSIS — Z79899 Other long term (current) drug therapy: Secondary | ICD-10-CM

## 2017-12-20 DIAGNOSIS — R0602 Shortness of breath: Secondary | ICD-10-CM

## 2017-12-20 DIAGNOSIS — I130 Hypertensive heart and chronic kidney disease with heart failure and stage 1 through stage 4 chronic kidney disease, or unspecified chronic kidney disease: Secondary | ICD-10-CM | POA: Diagnosis present

## 2017-12-20 DIAGNOSIS — E662 Morbid (severe) obesity with alveolar hypoventilation: Secondary | ICD-10-CM | POA: Diagnosis present

## 2017-12-20 DIAGNOSIS — B49 Unspecified mycosis: Secondary | ICD-10-CM | POA: Diagnosis present

## 2017-12-20 DIAGNOSIS — E860 Dehydration: Secondary | ICD-10-CM | POA: Diagnosis present

## 2017-12-20 DIAGNOSIS — Z7189 Other specified counseling: Secondary | ICD-10-CM | POA: Diagnosis not present

## 2017-12-20 DIAGNOSIS — E1165 Type 2 diabetes mellitus with hyperglycemia: Secondary | ICD-10-CM | POA: Diagnosis present

## 2017-12-20 DIAGNOSIS — Z959 Presence of cardiac and vascular implant and graft, unspecified: Secondary | ICD-10-CM

## 2017-12-20 DIAGNOSIS — K5900 Constipation, unspecified: Secondary | ICD-10-CM | POA: Diagnosis present

## 2017-12-20 DIAGNOSIS — I890 Lymphedema, not elsewhere classified: Secondary | ICD-10-CM | POA: Diagnosis not present

## 2017-12-20 DIAGNOSIS — N189 Chronic kidney disease, unspecified: Secondary | ICD-10-CM

## 2017-12-20 DIAGNOSIS — R410 Disorientation, unspecified: Secondary | ICD-10-CM | POA: Diagnosis not present

## 2017-12-20 DIAGNOSIS — E1140 Type 2 diabetes mellitus with diabetic neuropathy, unspecified: Secondary | ICD-10-CM | POA: Diagnosis present

## 2017-12-20 DIAGNOSIS — D631 Anemia in chronic kidney disease: Secondary | ICD-10-CM | POA: Diagnosis present

## 2017-12-20 DIAGNOSIS — K5792 Diverticulitis of intestine, part unspecified, without perforation or abscess without bleeding: Secondary | ICD-10-CM | POA: Diagnosis present

## 2017-12-20 DIAGNOSIS — Z515 Encounter for palliative care: Secondary | ICD-10-CM | POA: Diagnosis present

## 2017-12-20 DIAGNOSIS — J9610 Chronic respiratory failure, unspecified whether with hypoxia or hypercapnia: Secondary | ICD-10-CM | POA: Diagnosis present

## 2017-12-20 DIAGNOSIS — K529 Noninfective gastroenteritis and colitis, unspecified: Secondary | ICD-10-CM | POA: Diagnosis present

## 2017-12-20 DIAGNOSIS — I4891 Unspecified atrial fibrillation: Secondary | ICD-10-CM | POA: Diagnosis present

## 2017-12-20 DIAGNOSIS — I447 Left bundle-branch block, unspecified: Secondary | ICD-10-CM | POA: Diagnosis present

## 2017-12-20 DIAGNOSIS — Z9981 Dependence on supplemental oxygen: Secondary | ICD-10-CM

## 2017-12-20 DIAGNOSIS — Z9989 Dependence on other enabling machines and devices: Secondary | ICD-10-CM

## 2017-12-20 DIAGNOSIS — R5381 Other malaise: Secondary | ICD-10-CM | POA: Diagnosis present

## 2017-12-20 DIAGNOSIS — N390 Urinary tract infection, site not specified: Secondary | ICD-10-CM | POA: Diagnosis present

## 2017-12-20 DIAGNOSIS — G9340 Encephalopathy, unspecified: Secondary | ICD-10-CM | POA: Diagnosis present

## 2017-12-20 DIAGNOSIS — Z87448 Personal history of other diseases of urinary system: Secondary | ICD-10-CM

## 2017-12-20 DIAGNOSIS — M1990 Unspecified osteoarthritis, unspecified site: Secondary | ICD-10-CM | POA: Diagnosis present

## 2017-12-20 DIAGNOSIS — E8770 Fluid overload, unspecified: Secondary | ICD-10-CM | POA: Diagnosis present

## 2017-12-20 DIAGNOSIS — E039 Hypothyroidism, unspecified: Secondary | ICD-10-CM | POA: Diagnosis present

## 2017-12-20 DIAGNOSIS — R7989 Other specified abnormal findings of blood chemistry: Secondary | ICD-10-CM | POA: Diagnosis present

## 2017-12-20 DIAGNOSIS — Z6841 Body Mass Index (BMI) 40.0 and over, adult: Secondary | ICD-10-CM | POA: Diagnosis not present

## 2017-12-20 DIAGNOSIS — J45909 Unspecified asthma, uncomplicated: Secondary | ICD-10-CM | POA: Diagnosis present

## 2017-12-20 DIAGNOSIS — N179 Acute kidney failure, unspecified: Secondary | ICD-10-CM | POA: Diagnosis present

## 2017-12-20 DIAGNOSIS — E871 Hypo-osmolality and hyponatremia: Secondary | ICD-10-CM | POA: Diagnosis not present

## 2017-12-20 DIAGNOSIS — Z7401 Bed confinement status: Secondary | ICD-10-CM

## 2017-12-20 DIAGNOSIS — J4520 Mild intermittent asthma, uncomplicated: Secondary | ICD-10-CM | POA: Diagnosis not present

## 2017-12-20 DIAGNOSIS — E1122 Type 2 diabetes mellitus with diabetic chronic kidney disease: Secondary | ICD-10-CM | POA: Diagnosis present

## 2017-12-20 DIAGNOSIS — N139 Obstructive and reflux uropathy, unspecified: Secondary | ICD-10-CM | POA: Diagnosis present

## 2017-12-20 DIAGNOSIS — N200 Calculus of kidney: Secondary | ICD-10-CM | POA: Diagnosis present

## 2017-12-20 DIAGNOSIS — I480 Paroxysmal atrial fibrillation: Secondary | ICD-10-CM | POA: Diagnosis not present

## 2017-12-20 DIAGNOSIS — M479 Spondylosis, unspecified: Secondary | ICD-10-CM | POA: Diagnosis present

## 2017-12-20 LAB — CBC WITH DIFFERENTIAL/PLATELET
BASOS PCT: 0 %
Basophils Absolute: 0 10*3/uL (ref 0.0–0.1)
EOS ABS: 0 10*3/uL (ref 0.0–0.7)
Eosinophils Relative: 0 %
HCT: 36 % (ref 36.0–46.0)
Hemoglobin: 10.9 g/dL — ABNORMAL LOW (ref 12.0–15.0)
LYMPHS ABS: 2.4 10*3/uL (ref 0.7–4.0)
Lymphocytes Relative: 16 %
MCH: 26.3 pg (ref 26.0–34.0)
MCHC: 30.3 g/dL (ref 30.0–36.0)
MCV: 87 fL (ref 78.0–100.0)
Monocytes Absolute: 3.4 10*3/uL — ABNORMAL HIGH (ref 0.1–1.0)
Monocytes Relative: 23 %
NEUTROS PCT: 61 %
Neutro Abs: 8.9 10*3/uL — ABNORMAL HIGH (ref 1.7–7.7)
PLATELETS: 300 10*3/uL (ref 150–400)
RBC: 4.14 MIL/uL (ref 3.87–5.11)
RDW: 14.5 % (ref 11.5–15.5)
WBC: 14.7 10*3/uL — AB (ref 4.0–10.5)

## 2017-12-20 LAB — COMPREHENSIVE METABOLIC PANEL
ALT: 11 U/L — AB (ref 14–54)
AST: 16 U/L (ref 15–41)
Albumin: 2.7 g/dL — ABNORMAL LOW (ref 3.5–5.0)
Alkaline Phosphatase: 79 U/L (ref 38–126)
Anion gap: 13 (ref 5–15)
BUN: 66 mg/dL — AB (ref 6–20)
CO2: 26 mmol/L (ref 22–32)
CREATININE: 4.35 mg/dL — AB (ref 0.44–1.00)
Calcium: 8.3 mg/dL — ABNORMAL LOW (ref 8.9–10.3)
Chloride: 96 mmol/L — ABNORMAL LOW (ref 101–111)
GFR, EST AFRICAN AMERICAN: 11 mL/min — AB (ref >60)
GFR, EST NON AFRICAN AMERICAN: 9 mL/min — AB (ref >60)
Glucose, Bld: 152 mg/dL — ABNORMAL HIGH (ref 65–99)
Potassium: 5 mmol/L (ref 3.5–5.1)
Sodium: 135 mmol/L (ref 135–145)
Total Bilirubin: 0.5 mg/dL (ref 0.3–1.2)
Total Protein: 6.8 g/dL (ref 6.5–8.1)

## 2017-12-20 LAB — I-STAT CG4 LACTIC ACID, ED: Lactic Acid, Venous: 1.53 mmol/L (ref 0.5–1.9)

## 2017-12-20 LAB — CREATININE, URINE, RANDOM: CREATININE, URINE: 142.17 mg/dL

## 2017-12-20 LAB — URINALYSIS, ROUTINE W REFLEX MICROSCOPIC
BILIRUBIN URINE: NEGATIVE
Glucose, UA: NEGATIVE mg/dL
KETONES UR: NEGATIVE mg/dL
Nitrite: NEGATIVE
PH: 5 (ref 5.0–8.0)
PROTEIN: 30 mg/dL — AB
Specific Gravity, Urine: 1.015 (ref 1.005–1.030)

## 2017-12-20 LAB — AMMONIA: AMMONIA: 16 umol/L (ref 9–35)

## 2017-12-20 LAB — SODIUM, URINE, RANDOM: SODIUM UR: 17 mmol/L

## 2017-12-20 LAB — TSH: TSH: 17.474 u[IU]/mL — ABNORMAL HIGH (ref 0.350–4.500)

## 2017-12-20 LAB — I-STAT TROPONIN, ED: TROPONIN I, POC: 0 ng/mL (ref 0.00–0.08)

## 2017-12-20 LAB — BRAIN NATRIURETIC PEPTIDE: B NATRIURETIC PEPTIDE 5: 202.4 pg/mL — AB (ref 0.0–100.0)

## 2017-12-20 MED ORDER — HEPARIN SODIUM (PORCINE) 5000 UNIT/ML IJ SOLN
5000.0000 [IU] | Freq: Three times a day (TID) | INTRAMUSCULAR | Status: DC
  Administered 2017-12-20 – 2017-12-28 (×23): 5000 [IU] via SUBCUTANEOUS
  Filled 2017-12-20 (×23): qty 1

## 2017-12-20 MED ORDER — AMLODIPINE BESYLATE 5 MG PO TABS
5.0000 mg | ORAL_TABLET | Freq: Every day | ORAL | Status: DC
  Administered 2017-12-20 – 2017-12-22 (×3): 5 mg via ORAL
  Filled 2017-12-20 (×3): qty 1

## 2017-12-20 MED ORDER — POLYETHYLENE GLYCOL 3350 17 G PO PACK: 17.0000 g | PACK | Freq: Every day | ORAL | Status: DC

## 2017-12-20 MED ORDER — SODIUM CHLORIDE 0.9 % IV SOLN
INTRAVENOUS | Status: DC
  Administered 2017-12-20 – 2017-12-25 (×11): via INTRAVENOUS

## 2017-12-20 MED ORDER — MELATONIN 3 MG PO TABS
3.0000 mg | ORAL_TABLET | Freq: Every day | ORAL | Status: DC
  Administered 2017-12-20 – 2017-12-27 (×7): 3 mg via ORAL
  Filled 2017-12-20 (×9): qty 1

## 2017-12-20 MED ORDER — GUAIFENESIN ER 600 MG PO TB12
600.0000 mg | ORAL_TABLET | Freq: Two times a day (BID) | ORAL | Status: DC
  Administered 2017-12-20 – 2017-12-28 (×16): 600 mg via ORAL
  Filled 2017-12-20 (×16): qty 1

## 2017-12-20 MED ORDER — METOPROLOL TARTRATE 25 MG PO TABS
25.0000 mg | ORAL_TABLET | Freq: Two times a day (BID) | ORAL | Status: DC
  Administered 2017-12-20 – 2017-12-22 (×4): 25 mg via ORAL
  Filled 2017-12-20 (×4): qty 1

## 2017-12-20 MED ORDER — LEVOTHYROXINE SODIUM 50 MCG PO TABS
50.0000 ug | ORAL_TABLET | Freq: Every day | ORAL | Status: DC
  Administered 2017-12-20 – 2017-12-28 (×9): 50 ug via ORAL
  Filled 2017-12-20 (×9): qty 1

## 2017-12-20 MED ORDER — MONTELUKAST SODIUM 10 MG PO TABS
10.0000 mg | ORAL_TABLET | Freq: Every day | ORAL | Status: DC
  Administered 2017-12-20 – 2017-12-28 (×9): 10 mg via ORAL
  Filled 2017-12-20 (×9): qty 1

## 2017-12-20 MED ORDER — SODIUM CHLORIDE 0.9 % IV BOLUS
1000.0000 mL | Freq: Once | INTRAVENOUS | Status: AC
  Administered 2017-12-20: 1000 mL via INTRAVENOUS

## 2017-12-20 MED ORDER — AMIODARONE HCL 200 MG PO TABS
200.0000 mg | ORAL_TABLET | Freq: Every day | ORAL | Status: DC
  Administered 2017-12-20 – 2017-12-28 (×9): 200 mg via ORAL
  Filled 2017-12-20 (×9): qty 1

## 2017-12-20 MED ORDER — METRONIDAZOLE IN NACL 5-0.79 MG/ML-% IV SOLN
500.0000 mg | Freq: Three times a day (TID) | INTRAVENOUS | Status: DC
  Administered 2017-12-20 – 2017-12-22 (×5): 500 mg via INTRAVENOUS
  Filled 2017-12-20 (×5): qty 100

## 2017-12-20 MED ORDER — SODIUM CHLORIDE 0.9 % IV SOLN
2.0000 g | INTRAVENOUS | Status: DC
  Administered 2017-12-21: 2 g via INTRAVENOUS
  Filled 2017-12-20: qty 2
  Filled 2017-12-20: qty 20

## 2017-12-20 NOTE — ED Notes (Signed)
ED TO INPATIENT HANDOFF REPORT  Name/Age/Gender Jodi Underwood 72 y.o. female  Code Status    Code Status Orders  (From admission, onward)        Start     Ordered   12/20/17 1401  Full code  Continuous     12/20/17 1400    Code Status History    Date Active Date Inactive Code Status Order ID Comments User Context   06/11/2017 0325 06/16/2017 2051 Full Code [id number]  Norval Morton, MD ED   10/13/2016 2158 10/20/2016 2154 Full Code [id number]  Ivor Costa, MD ED   04/28/2016 0017 05/03/2016 2233 Full Code [id number]  Phillips Grout, MD Inpatient   09/26/2014 0227 10/04/2014 2006 Full Code [id number]  Theressa Millard, MD Inpatient   01/23/2013 2245 01/27/2013 2000 Full Code [id number]  Delfina Redwood, MD Inpatient      Home/SNF/Other Nursing Home  Chief Complaint fever   Level of Care/Admitting Diagnosis ED Disposition    ED Disposition Condition [facility] Area: [facility] [[id number]]  Level of Care: Telemetry [5]  Admit to tele based on following criteria: Other see comments  Comments: H/O Afib  Diagnosis: AKI (acute kidney injury) [facility]) [[id number]]  Admitting Physician: Shelly Coss [[id number]]  Attending Physician: Shelly Coss [[id number]]  Estimated length of stay: past midnight tomorrow  Certification:: I certify this patient will need inpatient services for at least 2 midnights  PT Class (Do Not Modify): Inpatient [101]  PT Acc Code (Do Not Modify): Private [1]       Medical History Past Medical History:  Diagnosis Date  . Anemia   . Arthritis   . Asthma   . Atrial fibrillation ([place])   . Degenerative disc disease   . Diabetes mellitus   . Diabetic neuropathy ([place])   . Edema extremities   . Hypertension   . Lymphedema   . Pneumonia   . Urinary incontinence     Allergies Allergies  Allergen Reactions  . Ciprofloxacin Other (See Comments)    Hallucinations    IV Location/Drains/Wounds Patient  Lines/Drains/Airways Status   Active Line/Drains/Airways    Name:   Placement date:   Placement time:   Site:   Days:   Peripheral IV 06/10/17 Left Wrist   06/10/17    2350    Wrist   193   External Urinary Catheter   -    -    -      Pressure Injury 04/28/16 Stage II -  Partial thickness loss of dermis presenting as a shallow open ulcer with a red, pink wound bed without slough. small circular 1cmx1cm    04/28/16    0000     601   Pressure Injury 10/14/16 Stage I -  Intact skin with non-blanchable redness of a localized area usually over a bony prominence.   10/14/16    0023     432   Wound / Incision (Open or Dehisced) 10/02/14 Other (Comment) Abdomen Left fissure in skin fold   10/02/14    2002    Abdomen   1175   Wound / Incision (Open or Dehisced) 10/14/16 Non-pressure wound Thigh Right   10/14/16    0023    Thigh   432   Wound / Incision (Open or Dehisced) 10/14/16 Non-pressure wound Leg Right;Lower   10/14/16    0023    Leg   432          Labs/Imaging  Results for orders placed or performed during the hospital encounter of 12/20/17 (from the past 48 hour(s))  Comprehensive metabolic panel     Status: Abnormal   Collection Time: 12/20/17 11:40 AM  Result Value Ref Range   Sodium 135 135 - 145 mmol/L   Potassium 5.0 3.5 - 5.1 mmol/L   Chloride 96 (L) 101 - 111 mmol/L   CO2 26 22 - 32 mmol/L   Glucose, Bld 152 (H) 65 - 99 mg/dL   BUN 66 (H) 6 - 20 mg/dL   Creatinine, Ser 4.35 (H) 0.44 - 1.00 mg/dL   Calcium 8.3 (L) 8.9 - 10.3 mg/dL   Total Protein 6.8 6.5 - 8.1 g/dL   Albumin 2.7 (L) 3.5 - 5.0 g/dL   AST 16 15 - 41 U/L   ALT 11 (L) 14 - 54 U/L   Alkaline Phosphatase 79 38 - 126 U/L   Total Bilirubin 0.5 0.3 - 1.2 mg/dL   GFR calc non Af Amer 9 (L) >60 mL/min   GFR calc Af Amer 11 (L) >60 mL/min    Comment: (NOTE) The eGFR has been calculated using the CKD EPI equation. This calculation has not been validated in all clinical situations. eGFR's persistently <60 mL/min signify  possible Chronic Kidney Disease.    Anion gap 13 5 - 15    Comment: Performed at [facility], [place] [address], [place], [place] [address]  CBC with Differential/Platelet     Status: Abnormal   Collection Time: 12/20/17 11:40 AM  Result Value Ref Range   WBC 14.7 (H) 4.0 - 10.5 K/uL   RBC 4.14 3.87 - 5.11 MIL/uL   Hemoglobin 10.9 (L) 12.0 - 15.0 g/dL   HCT 36.0 36.0 - 46.0 %   MCV 87.0 78.0 - 100.0 fL   MCH 26.3 26.0 - 34.0 pg   MCHC 30.3 30.0 - 36.0 g/dL   RDW 14.5 11.5 - 15.5 %   Platelets 300 150 - 400 K/uL   Neutrophils Relative % 61 %   Lymphocytes Relative 16 %   Monocytes Relative 23 %   Eosinophils Relative 0 %   Basophils Relative 0 %   Neutro Abs 8.9 (H) 1.7 - 7.7 K/uL   Lymphs Abs 2.4 0.7 - 4.0 K/uL   Monocytes Absolute 3.4 (H) 0.1 - 1.0 K/uL   Eosinophils Absolute 0.0 0.0 - 0.7 K/uL   Basophils Absolute 0.0 0.0 - 0.1 K/uL   WBC Morphology MILD LEFT SHIFT (1-5% METAS, OCC MYELO, OCC BANDS)     Comment: Performed at [facility], [place] [address] [place], [place] [address]  Ammonia     Status: None   Collection Time: 12/20/17 11:40 AM  Result Value Ref Range   Ammonia 16 9 - 35 umol/L    Comment: Performed at [facility], [place] [address], [place], [place] [address]  Brain natriuretic peptide     Status: Abnormal   Collection Time: 12/20/17 11:40 AM  Result Value Ref Range   B Natriuretic Peptide 202.4 (H) 0.0 - 100.0 pg/mL    Comment: Performed at [facility], [place] [address], [place], [place] [address]  I-stat troponin, ED     Status: None   Collection Time: 12/20/17 11:44 AM  Result Value Ref Range   Troponin i, poc 0.00 0.00 - 0.08 ng/mL   Comment 3            Comment: Due to the release kinetics of cTnI,  a negative result within the first hours of the onset of symptoms does not rule out myocardial infarction with certainty. If myocardial infarction is still suspected, repeat the  test at appropriate intervals.   I-Stat CG4 Lactic Acid, ED     Status: None   Collection Time: 12/20/17 11:47 AM  Result Value Ref Range   Lactic Acid, Venous 1.53 0.5 - 1.9 mmol/L   Dg Chest Portable 1 View  Result Date: 12/20/2017 CLINICAL DATA:  Shortness of breath.  Confusion. EXAM: PORTABLE CHEST 1 VIEW COMPARISON:  One-view chest x-ray 06/10/2017. FINDINGS: The heart size is exaggerated by low lung volumes. There is no edema or effusion. No focal airspace disease is present. Mild degenerative changes are present at the shoulders, left greater than right. IMPRESSION: 1. Low lung volumes. 2. Acute cardiopulmonary disease. 3. Degenerative changes of the shoulders. Electronically Signed   By: San Morelle M.D.   On: 12/20/2017 10:55    Pending Labs Unresulted Labs (From admission, onward)   Start     Ordered   12/21/17 7591  Basic metabolic panel  Tomorrow morning,   R     12/20/17 1400   12/21/17 0500  Comprehensive metabolic panel  Tomorrow morning,   R     12/20/17 1400   12/20/17 1400  CBC  (heparin)  Once,   R    Comments:  Baseline for heparin therapy IF NOT ALREADY DRAWN.  Notify MD if PLT < 100 K.    12/20/17 1400   12/20/17 1400  Creatinine, serum  (heparin)  Once,   R    Comments:  Baseline for heparin therapy IF NOT ALREADY DRAWN.    12/20/17 1400   12/20/17 1359  Hemoglobin A1c  Once,   R     12/20/17 1358   12/20/17 1356  Culture, Urine  Once,   R     12/20/17 1355   12/20/17 1355  Sodium, urine, random  Once,   R     12/20/17 1355   12/20/17 1355  Creatinine, urine, random  Once,   R     12/20/17 1355   12/20/17 1355  TSH  Once,   R     12/20/17 1355   12/20/17 1030  Urinalysis, Routine w reflex microscopic  Once,   R     12/20/17 1029      Vitals/Pain Today's Vitals   12/20/17 0943 12/20/17 0944 12/20/17 1423  BP: (!) 110/54  (!) 102/57  Pulse: 97  (!) 103  Resp: (!) 24  (!) 21  Temp: 99.2 F (37.3 C)  (!) 100.6 F (38.1 C)  TempSrc: Oral   Rectal  SpO2: 99%  99%  PainSc:  3      Isolation Precautions No active isolations  Medications Medications  0.9 %  sodium chloride infusion (has no administration in time range)  cefTRIAXone (ROCEPHIN) 2 g in sodium chloride 0.9 % 100 mL IVPB (has no administration in time range)  amiodarone (PACERONE) tablet 200 mg (has no administration in time range)  amLODipine (NORVASC) tablet 5 mg (has no administration in time range)  guaiFENesin (MUCINEX) 12 hr tablet 600 mg (has no administration in time range)  levothyroxine (SYNTHROID, LEVOTHROID) tablet 50 mcg (has no administration in time range)  Melatonin TABS 3 mg (has no administration in time range)  metoprolol tartrate (LOPRESSOR) tablet 25 mg (has no administration in time range)  montelukast (SINGULAIR) tablet 10 mg (has no administration in time range)  heparin injection  5,000 Units (has no administration in time range)  polyethylene glycol (MIRALAX / GLYCOLAX) packet 17 g (has no administration in time range)  sodium chloride 0.9 % bolus 1,000 mL (0 mLs Intravenous Stopped 12/20/17 1222)    Mobility non-ambulatory

## 2017-12-20 NOTE — H&P (Addendum)
History and Physical    Jodi Underwood SAY:[id number] DOB: Jun 21, 1946 DOA: 12/20/2017  PCP: Jodi Marble, MD   Patient coming from: SNF  Chief Complaint: Generalized weakness, abnormal lab  HPI: Jodi Underwood is a 72 y.o. female with medical history significant of morbid obesity, chronic lymphedema, hypertension, hypothyroidism, CKD stage 3 sleep apnea, on oxygen on baseline who was sent from [facility] with complaints of generalized weakness, altered mental status and acute kidney injury.  Patient is a poor historian.  Patient was really not sure why they sent her to the emergency department .But she says she was not really feeling well since last few days.  She reported that she was not having good urinary output and was very thirsty .  Patient was admitted here and was discharged on 06/16/2017 after management for A. fib with RVR.  She was discharged on warfarin but currently she is not on anticoagulation and heart rate is controlled. Patient found to have a creatinine of 4.35 on presentation with elevated BUN.  She also has mild elevated white cell counts.  As per the report, she was recently started on ceftriaxone in the nursing home for hematuria infection.  Patient denies any fever, chills or dysuria.  Patient seen and examined the bedside in the emergency department.  She looked comfortable during my evaluation.  She was alert and oriented.  She denies any chest pain, shortness of breath, palpitation, abdominal pain, dysuria, nausea, vomiting, diarrhea or headache.  ED Course: Foley was ordered.  UA and urine culture obtained.  Started on ceftriaxone.  Review of Systems: As per HPI otherwise 10 point review of systems negative.    Past Medical History:  Diagnosis Date  . Anemia   . Arthritis   . Asthma   . Atrial fibrillation ([place])   . Degenerative disc disease   . Diabetes mellitus   . Diabetic neuropathy ([place])   . Edema extremities    . Hypertension   . Lymphedema   . Pneumonia   . Urinary incontinence     Past Surgical History:  Procedure Laterality Date  . ABDOMINAL HYSTERECTOMY     COMPLETE     reports that she has never smoked. She has never used smokeless tobacco. She reports that she drinks alcohol. She reports that she does not use drugs.  Allergies  Allergen Reactions  . Ciprofloxacin Other (See Comments)    Hallucinations    Family History  Adopted: Yes     Prior to Admission medications   Medication Sig Start Date End Date Taking? Authorizing Provider  acetaminophen (TYLENOL) 325 MG tablet Take 650 mg by mouth every 6 (six) hours as needed (For pain.).   Yes [provider]  albuterol (ACCUNEB) 0.63 MG/3ML nebulizer solution Take 1 ampule by nebulization every 6 (six) hours as needed for wheezing or shortness of breath.   Yes [provider]  amiodarone (PACERONE) 200 MG tablet Take 200 mg by mouth daily. 08/25/17  Yes [provider]  amLODipine (NORVASC) 5 MG tablet Take 5 mg by mouth daily.   Yes [provider]  bumetanide (BUMEX) 2 MG tablet Take 2 mg by mouth daily. 11/09/17  Yes [provider]  cefTRIAXone (ROCEPHIN) 1 g SOLR injection Inject 1 g into the vein at bedtime.   Yes [provider]  cetirizine (ZYRTEC ALLERGY) 10 MG tablet Take 10 mg by mouth daily.   Yes [provider]  cyclobenzaprine (FLEXERIL) 10 MG tablet  Take 10 mg by mouth every 6 (six) hours as needed for muscle spasms.   Yes [provider]  docusate sodium (COLACE) 100 MG capsule Take 100 mg by mouth every other day.   Yes [provider]  fluticasone (FLONASE) 50 MCG/ACT nasal spray Place 2 sprays into both nostrils daily. Patient taking differently: Place 1 spray into both nostrils every 12 (twelve) hours.  10/20/16  Yes Patrecia Pour, Christean Grief, MD  guaiFENesin (MUCINEX) 600 MG 12 hr tablet Take 600 mg by mouth every 12 (twelve) hours.   Yes  [provider]  levothyroxine (SYNTHROID, LEVOTHROID) 50 MCG tablet Take 50 mcg by mouth daily. 11/08/17  Yes [provider]  Melatonin 3 MG TABS Take 3 mg by mouth at bedtime.   Yes [provider]  metoprolol tartrate (LOPRESSOR) 25 MG tablet Take 25 mg by mouth every 12 (twelve) hours. 11/08/17  Yes [provider]  montelukast (SINGULAIR) 10 MG tablet Take 10 mg by mouth daily. 08/24/17  Yes [provider]  ondansetron (ZOFRAN) 8 MG tablet Take by mouth every 8 (eight) hours as needed for nausea or vomiting.   Yes [provider]  potassium chloride (K-DUR,KLOR-CON) 10 MEQ tablet Take 10 mEq by mouth 3 (three) times daily.   Yes [provider]  promethazine (PHENERGAN) 25 MG tablet Take 25 mg by mouth every 6 (six) hours as needed for nausea or vomiting.   Yes [provider]  senna (SENOKOT) 8.6 MG TABS tablet Take 1 tablet (8.6 mg total) by mouth daily. Patient taking differently: Take 1 tablet by mouth every other day.  06/17/17  Yes Patrecia Pour, MD  simethicone (MYLICON) 80 MG chewable tablet Chew 80 mg by mouth every 6 (six) hours as needed for flatulence.   Yes [provider]  traMADol (ULTRAM) 50 MG tablet Take 50 mg by mouth every 6 (six) hours as needed (For pain.).   Yes [provider]    Physical Exam: Vitals:   12/20/17 0943  BP: (!) 110/54  Pulse: 97  Resp: (!) 24  Temp: 99.2 F (37.3 C)  TempSrc: Oral  SpO2: 99%    Constitutional: Not in acute distress, morbidly obese, weak Vitals:   12/20/17 0943  BP: (!) 110/54  Pulse: 97  Resp: (!) 24  Temp: 99.2 F (37.3 C)  TempSrc: Oral  SpO2: 99%   Eyes: PERRL, lids and conjunctivae normal ENMT: Mucous membranes are dry. Posterior pharynx clear of any exudate or lesions.Normal dentition.  Neck: normal, supple, no masses, no thyromegaly Respiratory: Bilateral decreased air entry. Normal respiratory effort. No accessory muscle  use.  Cardiovascular: Regular rate and rhythm, no murmurs / rubs / gallops. No extremity edema. 2+ pedal pulses. No carotid bruits.  Abdomen: no tenderness, no masses palpated. No hepatosplenomegaly. Bowel sounds positive.  Musculoskeletal: no clubbing / cyanosis. No joint deformity upper and lower extremities. Good ROM, no contractures. Normal muscle tone. Bilateral lower extremity lymphedema which is chronic Skin: no rashes, lesions, ulcers. No induration Neurologic: CN 2-12 grossly intact. Sensation intact, weakness of bilateral lower extremities Psychiatric: Normal judgment and insight. Alert and oriented x 3. Normal mood.   Foley Catheter:None  Labs on Admission: I have personally reviewed following labs and imaging studies  CBC: Recent Labs  Lab 12/20/17 1140  WBC 14.7*  NEUTROABS 8.9*  HGB 10.9*  HCT 36.0  MCV 87.0  PLT 161   Basic Metabolic Panel: Recent Labs  Lab 12/20/17 1140  NA 135  K 5.0  CL 96*  CO2 26  GLUCOSE 152*  BUN 66*  CREATININE 4.35*  CALCIUM 8.3*   GFR: CrCl cannot be calculated (Unknown ideal weight.). Liver Function Tests: Recent Labs  Lab 12/20/17 1140  AST 16  ALT 11*  ALKPHOS 79  BILITOT 0.5  PROT 6.8  ALBUMIN 2.7*   No results for input(s): LIPASE, AMYLASE in the last 168 hours. Recent Labs  Lab 12/20/17 1140  AMMONIA 16   Coagulation Profile: No results for input(s): INR, PROTIME in the last 168 hours. Cardiac Enzymes: No results for input(s): CKTOTAL, CKMB, CKMBINDEX, TROPONINI in the last 168 hours. BNP (last 3 results) No results for input(s): PROBNP in the last 8760 hours. HbA1C: No results for input(s): HGBA1C in the last 72 hours. CBG: No results for input(s): GLUCAP in the last 168 hours. Lipid Profile: No results for input(s): CHOL, HDL, LDLCALC, TRIG, CHOLHDL, LDLDIRECT in the last 72 hours. Thyroid Function Tests: No results for input(s): TSH, T4TOTAL, FREET4, T3FREE, THYROIDAB in the last 72 hours. Anemia  Panel: No results for input(s): VITAMINB12, FOLATE, FERRITIN, TIBC, IRON, RETICCTPCT in the last 72 hours. Urine analysis:    Component Value Date/Time   COLORURINE YELLOW 04/27/2016 2027   APPEARANCEUR CLOUDY (A) 04/27/2016 2027   LABSPEC 1.017 04/27/2016 2027   PHURINE 6.0 04/27/2016 2027   GLUCOSEU NEGATIVE 04/27/2016 2027   HGBUR MODERATE (A) 04/27/2016 2027   BILIRUBINUR NEGATIVE 04/27/2016 2027   KETONESUR NEGATIVE 04/27/2016 2027   PROTEINUR 30 (A) 04/27/2016 2027   UROBILINOGEN 0.2 09/29/2014 1224   NITRITE POSITIVE (A) 04/27/2016 2027   LEUKOCYTESUR MODERATE (A) 04/27/2016 2027    Radiological Exams on Admission: Dg Chest Portable 1 View  Result Date: 12/20/2017 CLINICAL DATA:  Shortness of breath.  Confusion. EXAM: PORTABLE CHEST 1 VIEW COMPARISON:  One-view chest x-ray 06/10/2017. FINDINGS: The heart size is exaggerated by low lung volumes. There is no edema or effusion. No focal airspace disease is present. Mild degenerative changes are present at the shoulders, left greater than right. IMPRESSION: 1. Low lung volumes. 2. Acute cardiopulmonary disease. 3. Degenerative changes of the shoulders. Electronically Signed   By: San Morelle M.D.   On: 12/20/2017 10:55     Assessment/Plan Principal Problem:   Acute on chronic renal failure (HCC) Active Problems:   Morbid obesity ([place])   Essential hypertension   Asthma   Lymphedema   UTI (urinary tract infection)   Constipation   AKI (acute kidney injury) ([place])   A-fib ([place])  AKI on CKD stage III: Her last creatinine was 1.77 on 06/16/2017.  Her creatinine ranges from 1.4-2.3.  Patient looks dry and dehydrated.  She was on Bumetanide at the skilled nursing facility which has been held. We will hydrate the patient.  Foley will be inserted. Acute kidney injury could be from volume depletion versus UTI versus obstructive pathology.  Will check CT stone. We have to consider nephrology consultation tomorrow if kidney  function does not improve with IV fluids.  We will check urine sodium, urine creatinine, renal ultrasound.  Urinary tract infection: It was reported that patient was been treated with ceftriaxone for urinary tract infection.  Denies any fever or chills.  UA was not collected during my evaluation.  Started on ceftriaxone.  He UA and urine culture ordered.  Sleep apnea obesity hypoventilation syndrome/chronic respiratory insufficiency: Recently started on oxygen .Currently her sats are fine with supplemental oxygen.  We will continue CPAP here.  Heart failure with reduced  ejection fraction: Echocardiogram on 11/18 shows ejection fraction of 50%, hypokinesis of anteroseptal, inferior and inferoseptal myocardium.  Patient was on bumetanide at nursing home.Not on ACE or ARB.  On metoprolol .Diuresis will be held as patient is hypovolemic.  Morbid obesity/lymphedema: Patient is debilitated and bedbound due to her morbid obesity and lymphedema.  She has not ambulated since November 2018.  She says she has lost more than 100 pounds in last year.  History of A. fib: Was discharged on Coumadin as per the records on November 2018.  Currently not on any anticoagulation.  Rate is controlled.  EKG showed normal sinus rhythm.On amiodarone at home which we will continue.  Hypothyroidism: Continue Synthyroid.  Will check TSH.  History of hypertension: Currently blood pressure stable.  Will resume her home meds.  Asthma: Currently stable.  No wheezes auscultated.    Addendum: CT study showed moderate sigmoid colitis.  Patient denies any abdominal pain.  Will add Flagyl.Follow up GI pathogen panel.  Severity of Illness: The appropriate patient status for this patient is INPATIENT.  DVT prophylaxis: Heparin [place] Code Status: Full Family Communication: None present at the bedside Consults called: None     Shelly Coss MD [facility] Pager [phone]  If 7PM-7AM, please contact  night-coverage [email or web address] Password [email or web address]  12/20/2017, 2:03 PM

## 2017-12-20 NOTE — ED Notes (Signed)
Unsuccessful lab draw x2; phlebotomy at bedside at present.

## 2017-12-20 NOTE — ED Notes (Signed)
Bed: WA16 Expected date:  Expected time:  Means of arrival:  Comments: EMS- UTI? 

## 2017-12-20 NOTE — ED Triage Notes (Signed)
Per EMS, pt from [facility]. Pt has been treated for UTI x 1 week. Staff state the pt seemed altered this morning. EMS report pt was [facility] upon arrival. Pt has labs showing creatinine of 4.09, collected yesterday. Pt reports nausea, states she has had nausea and diarrhea while on abx.   BP 132/72 CBG 149 HR 90, hx of a-fib O2 100% on 2L [place]

## 2017-12-20 NOTE — ED Provider Notes (Signed)
[facility] DEPT Provider Note   CSN: [id number] Arrival date & time: 12/20/17  3818     History   Chief Complaint Chief Complaint  Patient presents with  . Fever  . Altered Mental Status    HPI Seniyah Esker is a 72 y.o. female.  HPI   Patient is a 72 year old female presenting with abnormal labs.  Patient has morbid obesity, she is over 400 pounds.  She has history of lymphedema, diabetes, A. fib, anemia, pneumonia, hypertension.  She has CKD and according to notes her normal creatinine is between 1.4 and 2.3.  Her [facility] presents said that she has a urinary tract infection that she is been receiving antibiotics for the last 6 weeks.  Patient has a PICC line in her left arm.  Patient fairly altered on arrival.  Mild somnolence.  Past Medical History:  Diagnosis Date  . Anemia   . Arthritis   . Asthma   . Atrial fibrillation ([place])   . Degenerative disc disease   . Diabetes mellitus   . Diabetic neuropathy ([place])   . Edema extremities   . Hypertension   . Lymphedema   . Pneumonia   . Urinary incontinence     Patient Active Problem List   Diagnosis Date Noted  . Chronic systolic CHF (congestive heart failure) ([place]) 12/20/2017  . A-fib ([place]) 06/11/2017  . Cellulitis, leg 10/13/2016  . AKI (acute kidney injury) ([place]) 10/13/2016  . Hypertension   . Lymphedema of both lower extremities   . Abnormal CT of the abdomen   . Pressure injury of skin 04/28/2016  . Intractable lower abdominal pain 04/27/2016  . Constipation   . Respiratory distress   . Acute cystitis without hematuria   . Sepsis ([place]) 09/26/2014  . UTI (urinary tract infection) 09/26/2014  . Asthma 09/26/2014  . Asthma with acute exacerbation   . Blood poisoning   . Urinary tract infectious disease   . Diabetes type 2, uncontrolled ([place])   . Acute on chronic renal failure ([place])   . Essential hypertension, benign   . Acute renal failure ([place])  01/25/2013  . Cellulitis of leg, bilateral 01/23/2013  . Ulcers of both lower extremities ([place]) 01/23/2013  . Lymphedema 06/13/2011  . DM type 2, uncontrolled, with neuropathy ([place]) 06/23/2010  . Morbid obesity ([place]) 06/23/2010  . ANEMIA-NOS 06/23/2010  . Essential hypertension 06/23/2010  . Asthma 06/23/2010    Past Surgical History:  Procedure Laterality Date  . ABDOMINAL HYSTERECTOMY     COMPLETE     OB History   None      Home Medications    Prior to Admission medications   Medication Sig Start Date End Date Taking? Authorizing Provider  acetaminophen (TYLENOL) 325 MG tablet Take 650 mg by mouth every 6 (six) hours as needed (For pain.).   Yes [provider]  albuterol (ACCUNEB) 0.63 MG/3ML nebulizer solution Take 1 ampule by nebulization every 6 (six) hours as needed for wheezing or shortness of breath.   Yes [provider]  amiodarone (PACERONE) 200 MG tablet Take 200 mg by mouth daily. 08/25/17  Yes [provider]  amLODipine (NORVASC) 5 MG tablet Take 5 mg by mouth daily.   Yes [provider]  bumetanide (BUMEX) 2 MG tablet Take 2 mg by mouth daily. 11/09/17  Yes [provider]  cefTRIAXone (ROCEPHIN) 1 g SOLR injection Inject 1 g into the vein at bedtime.   Yes [provider]  cetirizine (ZYRTEC ALLERGY) 10 MG tablet Take 10 mg by mouth daily.   Yes [provider]  cyclobenzaprine (FLEXERIL) 10 MG tablet Take 10 mg by mouth every 6 (six) hours as needed for muscle spasms.   Yes [provider]  docusate sodium (COLACE) 100 MG capsule Take 100 mg by mouth every other day.   Yes [provider]  fluticasone (FLONASE) 50 MCG/ACT nasal spray Place 2 sprays into both nostrils daily. Patient taking differently: Place 1 spray into both nostrils every 12 (twelve) hours.  10/20/16  Yes Patrecia Pour, Christean Grief, MD  guaiFENesin (MUCINEX) 600 MG 12 hr tablet Take 600 mg by mouth every 12 (twelve) hours.    Yes [provider]  levothyroxine (SYNTHROID, LEVOTHROID) 50 MCG tablet Take 50 mcg by mouth daily. 11/08/17  Yes [provider]  Melatonin 3 MG TABS Take 3 mg by mouth at bedtime.   Yes [provider]  metoprolol tartrate (LOPRESSOR) 25 MG tablet Take 25 mg by mouth every 12 (twelve) hours. 11/08/17  Yes [provider]  montelukast (SINGULAIR) 10 MG tablet Take 10 mg by mouth daily. 08/24/17  Yes [provider]  ondansetron (ZOFRAN) 8 MG tablet Take by mouth every 8 (eight) hours as needed for nausea or vomiting.   Yes [provider]  potassium chloride (K-DUR,KLOR-CON) 10 MEQ tablet Take 10 mEq by mouth 3 (three) times daily.   Yes [provider]  promethazine (PHENERGAN) 25 MG tablet Take 25 mg by mouth every 6 (six) hours as needed for nausea or vomiting.   Yes [provider]  senna (SENOKOT) 8.6 MG TABS tablet Take 1 tablet (8.6 mg total) by mouth daily. Patient taking differently: Take 1 tablet by mouth every other day.  06/17/17  Yes Patrecia Pour, MD  simethicone (MYLICON) 80 MG chewable tablet Chew 80 mg by mouth every 6 (six) hours as needed for flatulence.   Yes [provider]  traMADol (ULTRAM) 50 MG tablet Take 50 mg by mouth every 6 (six) hours as needed (For pain.).   Yes [provider]    Family History Family History  Adopted: Yes    Social History Social History   Tobacco Use  . Smoking status: Never Smoker  . Smokeless tobacco: Never Used  Substance Use Topics  . Alcohol use: Yes    Comment: occassionally  . Drug use: No     Allergies   Ciprofloxacin   Review of Systems Review of Systems  Constitutional: Positive for activity change.  Cardiovascular: Negative for chest pain.  Gastrointestinal: Negative for abdominal pain.  Genitourinary: Positive for dysuria.  All other systems reviewed and are negative.    Physical Exam Updated Vital Signs BP (!) 139/56  (BP Location: Right Arm)   Pulse 87   Temp 98.2 F (36.8 C) (Oral)   Resp 20   Ht 5\' 6"  (1.676 m)   Wt (!) 157.9 kg (348 lb 1.7 oz) Comment: Simultaneous filing. User may not have seen previous data.  SpO2 97%   BMI 56.19 kg/m   Physical Exam  Constitutional: She appears well-developed and well-nourished.  HENT:  Head: Normocephalic and atraumatic.  Eyes: Right eye exhibits no discharge. Left eye exhibits no discharge.  Cardiovascular: Normal rate, regular rhythm and normal heart sounds.  No murmur heard. Pulmonary/Chest: Effort normal and breath sounds normal. She has no wheezes. She has no rales.  Abdominal: Soft. She exhibits no distension. There is tenderness.  Diffuse nonfocal tenderness.  Small reducible umbilical hernia.  Musculoskeletal:  Bilateral lower extremities moving, clear lymphedema.  Neurological:  Oriented but somnolent.  Skin: Skin is warm and dry. She is not diaphoretic.  Nursing note and vitals reviewed.    ED Treatments / Results  Labs (all labs ordered are listed, but only abnormal results are displayed) Labs Reviewed  URINE CULTURE - Abnormal; Notable for the following components:      Result Value   Culture 40,000 COLONIES/mL YEAST (*)    All other components within normal limits  COMPREHENSIVE METABOLIC PANEL - Abnormal; Notable for the following components:   Chloride 96 (*)    Glucose, Bld 152 (*)    BUN 66 (*)    Creatinine, Ser 4.35 (*)    Calcium 8.3 (*)    Albumin 2.7 (*)    ALT 11 (*)    GFR calc non Af Amer 9 (*)    GFR calc Af Amer 11 (*)    All other components within normal limits  CBC WITH DIFFERENTIAL/PLATELET - Abnormal; Notable for the following components:   WBC 14.7 (*)    Hemoglobin 10.9 (*)    Neutro Abs 8.9 (*)    Monocytes Absolute 3.4 (*)    All other components within normal limits  URINALYSIS, ROUTINE W REFLEX MICROSCOPIC - Abnormal; Notable for the following components:   APPearance CLOUDY (*)    Hgb urine  dipstick MODERATE (*)    Protein, ur 30 (*)    Leukocytes, UA LARGE (*)    WBC, UA >50 (*)    Bacteria, UA RARE (*)    All other components within normal limits  BRAIN NATRIURETIC PEPTIDE - Abnormal; Notable for the following components:   B Natriuretic Peptide 202.4 (*)    All other components within normal limits  TSH - Abnormal; Notable for the following components:   TSH 17.474 (*)    All other components within normal limits  COMPREHENSIVE METABOLIC PANEL - Abnormal; Notable for the following components:   Chloride 99 (*)    Glucose, Bld 157 (*)    BUN 70 (*)    Creatinine, Ser 4.22 (*)    Calcium 8.1 (*)    Total Protein 5.9 (*)    Albumin 2.4 (*)    AST 14 (*)    ALT 10 (*)    GFR calc non Af Amer 10 (*)    GFR calc Af Amer 11 (*)    All other components within normal limits  HEMOGLOBIN A1C - Abnormal; Notable for the following components:   Hgb A1c MFr Bld 6.1 (*)    All other components within normal limits  MAGNESIUM - Abnormal; Notable for the following components:   Magnesium 1.5 (*)    All other components within normal limits  CBC WITH DIFFERENTIAL/PLATELET - Abnormal; Notable for the following components:   WBC 20.1 (*)    RBC 3.69 (*)    Hemoglobin 9.8 (*)    HCT 31.8 (*)    All other components within normal limits  GLUCOSE, CAPILLARY - Abnormal; Notable for the following components:   Glucose-Capillary 136 (*)    All other components within normal limits  GASTROINTESTINAL PANEL BY PCR, STOOL (REPLACES STOOL CULTURE)  URINE CULTURE  CULTURE, BLOOD (ROUTINE X 2)  CULTURE, BLOOD (ROUTINE X 2)  AMMONIA  SODIUM, URINE, RANDOM  CREATININE, URINE, RANDOM  PHOSPHORUS  T4, FREE  CBC WITH DIFFERENTIAL/PLATELET  CBC WITH DIFFERENTIAL/PLATELET  T3  CBC WITH DIFFERENTIAL/PLATELET  COMPREHENSIVE METABOLIC  PANEL  MAGNESIUM  PHOSPHORUS  I-STAT TROPONIN, ED  I-STAT CG4 LACTIC ACID, ED    EKG EKG Interpretation  Date/Time:  Thursday December 20 2017 11:54:09  EDT Ventricular Rate:  90 PR Interval:    QRS Duration: 110 QT Interval:  365 QTC Calculation: 447 R Axis:   39 Text Interpretation:  Sinus rhythm Incomplete left bundle branch block Minimal ST elevation, inferior leads Normal sinus rhythm Confirmed by Zenovia Jarred [phone]) on 12/20/2017 1:52:07 PM   Radiology US Renal  Result Date: 12/20/2017 CLINICAL DATA:  72 year old female with acute kidney injury. Renal failure. UTI. EXAM: RENAL / URINARY TRACT ULTRASOUND COMPLETE COMPARISON:  Noncontrast CT Abdomen and Pelvis today reported separately. FINDINGS: Right Kidney: Length: 10.8 centimeters. Simple appearing 2.3 cm upper pole cyst (image 9). Echogenicity within normal limits. No solid mass or hydronephrosis visualized. Left Kidney: Length: 11.1 centimeters. Simple appearing 2.7 cm upper pole cyst (image 25). Echogenicity within normal limits. No solid mass or hydronephrosis visualized. Bladder: Decompressed urinary bladder with mild to moderate circumferential wall thickening (image 42). IMPRESSION: 1. Negative for age ultrasound appearance of both kidneys. 2. Contracted urinary bladder with mild to moderate nonspecific wall thickening. Electronically Signed   By: Genevie Ann M.D.   On: 12/20/2017 15:25   Dg Chest Portable 1 View  Result Date: 12/20/2017 CLINICAL DATA:  Shortness of breath.  Confusion. EXAM: PORTABLE CHEST 1 VIEW COMPARISON:  One-view chest x-ray 06/10/2017. FINDINGS: The heart size is exaggerated by low lung volumes. There is no edema or effusion. No focal airspace disease is present. Mild degenerative changes are present at the shoulders, left greater than right. IMPRESSION: 1. Low lung volumes. 2. Acute cardiopulmonary disease. 3. Degenerative changes of the shoulders. Electronically Signed   By: San Morelle M.D.   On: 12/20/2017 10:55   Ct Renal Stone Study  Result Date: 12/20/2017 CLINICAL DATA:  Recurrent urinary tract infection. Nephrolithiasis. Chronic kidney  disease. EXAM: CT ABDOMEN AND PELVIS WITHOUT CONTRAST TECHNIQUE: Multidetector CT imaging of the abdomen and pelvis was performed following the standard protocol without IV contrast. COMPARISON:  04/27/2016 FINDINGS: Lower Chest: No acute findings. Hepatobiliary: No hepatic masses identified. Gallbladder is unremarkable. Pancreas:  No mass or inflammatory changes. Spleen: Within normal limits in size and appearance. Adrenals/Urinary Tract: A few tiny calculi are seen within the right renal collecting system, largest in the right renal pelvis measuring 4 mm. No evidence of ureteral calculi or hydronephrosis. Unremarkable unopacified urinary bladder. Stomach/Bowel: Moderate wall thickening and mild pericolonic inflammatory changes are seen involving the sigmoid colon, although there is no evidence of colonic diverticular disease in this region. No other areas of bowel wall thickening identified. No evidence of abscess or free fluid. A moderate paraumbilical hernia is seen which contains a loop of transverse colon and fat. Vascular/Lymphatic: No pathologically enlarged lymph nodes. No abdominal aortic aneurysm. Reproductive: Prior hysterectomy noted. Adnexal regions are unremarkable in appearance. Other:  None. Musculoskeletal:  No suspicious bone lesions identified. IMPRESSION: Moderate nonspecific sigmoid colitis, which could be infectious or ischemic in etiology. No evidence of abscess or free fluid. Moderate paraumbilical hernia containing transverse colon and fat. Tiny nonobstructing calculi within the right renal collecting system. No evidence of ureteral calculi or hydronephrosis. Electronically Signed   By: Earle Gell M.D.   On: 12/20/2017 15:23    Procedures Procedures (including critical care time)    Medications Ordered in ED Medications  0.9 %  sodium chloride infusion ( Intravenous New Bag/Given 12/21/17 1313)  cefTRIAXone (  ROCEPHIN) 2 g in sodium chloride 0.9 % 100 mL IVPB (0 g Intravenous  Stopped 12/21/17 1410)  amiodarone (PACERONE) tablet 200 mg (200 mg Oral Given 12/21/17 0933)  amLODipine (NORVASC) tablet 5 mg (5 mg Oral Given 12/21/17 0933)  guaiFENesin (MUCINEX) 12 hr tablet 600 mg (600 mg Oral Given 12/21/17 2130)  levothyroxine (SYNTHROID, LEVOTHROID) tablet 50 mcg (50 mcg Oral Given 12/21/17 0933)  Melatonin TABS 3 mg (3 mg Oral Given 12/21/17 2130)  metoprolol tartrate (LOPRESSOR) tablet 25 mg (25 mg Oral Given 12/21/17 2130)  montelukast (SINGULAIR) tablet 10 mg (10 mg Oral Given 12/21/17 0933)  heparin injection 5,000 Units (5,000 Units Subcutaneous Given 12/21/17 2129)  metroNIDAZOLE (FLAGYL) IVPB 500 mg (0 mg Intravenous Stopped 12/21/17 2044)  ondansetron (ZOFRAN) injection 4 mg (4 mg Intravenous Given 12/21/17 1337)    Or  ondansetron (ZOFRAN-ODT) disintegrating tablet 4 mg ( Oral See Alternative 12/21/17 1337)  sodium chloride flush (NS) 0.9 % injection 10-40 mL (has no administration in time range)  sodium chloride flush (NS) 0.9 % injection 10-40 mL (has no administration in time range)  insulin aspart (novoLOG) injection 0-9 Units (1 Units Subcutaneous Given 12/21/17 1751)  fluconazole (DIFLUCAN) IVPB 200 mg (200 mg Intravenous New Bag/Given 12/21/17 2130)  sodium chloride 0.9 % bolus 1,000 mL (0 mLs Intravenous Stopped 12/20/17 1222)  magnesium sulfate IVPB 2 g 50 mL (2 g Intravenous New Bag/Given 12/21/17 1751)     Initial Impression / Assessment and Plan / ED Course  I have reviewed the triage vital signs and the nursing notes.  Pertinent labs & imaging results that were available during my care of the patient were reviewed by me and considered in my medical decision making (see chart for details).     Patient is a 72 year old female presenting with abnormal labs.  Patient has morbid obesity, she is over 400 pounds.  She has history of lymphedema, diabetes, A. fib, anemia, pneumonia, hypertension.  She has CKD and according to notes her normal creatinine is between 1.4 and 2.3.   Her [facility] presents said that she has a urinary tract infection that she is been receiving antibiotics for the last 6 weeks.  Patient has a PICC line in her left arm.  Patient fairly altered on arrival.  Mild somnolence.   10:58 AM Patient reported to have an elevated creatinine.  Creatinine is 4 up from baseline of 1.  Patient has been on 4 days of rifaximin for urinary tract infection.  Concern right now for elevated BUN, causing confusion. Likely AKI from dehydration in the setting of UTI.   Patient straight cathed for urine.  We will send a UA and culture.   Final Clinical Impressions(s) / ED Diagnoses   Final diagnoses:  AKI (acute kidney injury) [facility])    ED Discharge Orders    None       Macarthur Critchley, MD 12/21/17 2206

## 2017-12-21 DIAGNOSIS — I890 Lymphedema, not elsewhere classified: Secondary | ICD-10-CM

## 2017-12-21 DIAGNOSIS — N390 Urinary tract infection, site not specified: Secondary | ICD-10-CM

## 2017-12-21 DIAGNOSIS — J4520 Mild intermittent asthma, uncomplicated: Secondary | ICD-10-CM

## 2017-12-21 DIAGNOSIS — K529 Noninfective gastroenteritis and colitis, unspecified: Secondary | ICD-10-CM

## 2017-12-21 DIAGNOSIS — I10 Essential (primary) hypertension: Secondary | ICD-10-CM

## 2017-12-21 DIAGNOSIS — I480 Paroxysmal atrial fibrillation: Secondary | ICD-10-CM

## 2017-12-21 LAB — COMPREHENSIVE METABOLIC PANEL
ALT: 10 U/L — ABNORMAL LOW (ref 14–54)
ANION GAP: 12 (ref 5–15)
AST: 14 U/L — ABNORMAL LOW (ref 15–41)
Albumin: 2.4 g/dL — ABNORMAL LOW (ref 3.5–5.0)
Alkaline Phosphatase: 73 U/L (ref 38–126)
BUN: 70 mg/dL — ABNORMAL HIGH (ref 6–20)
CHLORIDE: 99 mmol/L — AB (ref 101–111)
CO2: 24 mmol/L (ref 22–32)
Calcium: 8.1 mg/dL — ABNORMAL LOW (ref 8.9–10.3)
Creatinine, Ser: 4.22 mg/dL — ABNORMAL HIGH (ref 0.44–1.00)
GFR calc non Af Amer: 10 mL/min — ABNORMAL LOW (ref >60)
GFR, EST AFRICAN AMERICAN: 11 mL/min — AB (ref >60)
Glucose, Bld: 157 mg/dL — ABNORMAL HIGH (ref 65–99)
POTASSIUM: 4.8 mmol/L (ref 3.5–5.1)
SODIUM: 135 mmol/L (ref 135–145)
Total Bilirubin: 1 mg/dL (ref 0.3–1.2)
Total Protein: 5.9 g/dL — ABNORMAL LOW (ref 6.5–8.1)

## 2017-12-21 LAB — HEMOGLOBIN A1C
Hgb A1c MFr Bld: 6.1 % — ABNORMAL HIGH (ref 4.8–5.6)
MEAN PLASMA GLUCOSE: 128.37 mg/dL

## 2017-12-21 LAB — URINE CULTURE

## 2017-12-21 LAB — CBC WITH DIFFERENTIAL/PLATELET
BASOS PCT: 0 %
Basophils Absolute: 0.1 10*3/uL (ref 0.0–0.1)
Eosinophils Absolute: 0 10*3/uL (ref 0.0–0.7)
Eosinophils Relative: 0 %
HCT: 31.8 % — ABNORMAL LOW (ref 36.0–46.0)
Hemoglobin: 9.8 g/dL — ABNORMAL LOW (ref 12.0–15.0)
Lymphocytes Relative: 11 %
Lymphs Abs: 2.2 10*3/uL (ref 0.7–4.0)
MCH: 26.6 pg (ref 26.0–34.0)
MCHC: 30.8 g/dL (ref 30.0–36.0)
MCV: 86.2 fL (ref 78.0–100.0)
MONOS PCT: 17 %
Monocytes Absolute: 3.4 10*3/uL (ref 0.1–1.0)
NEUTROS PCT: 72 %
Neutro Abs: 14.4 10*3/uL (ref 1.7–7.7)
Platelets: 294 10*3/uL (ref 150–400)
RBC: 3.69 MIL/uL — AB (ref 3.87–5.11)
RDW: 14.7 % (ref 11.5–15.5)
WBC: 20.1 10*3/uL — AB (ref 4.0–10.5)

## 2017-12-21 LAB — T4, FREE: FREE T4: 0.84 ng/dL (ref 0.82–1.77)

## 2017-12-21 LAB — PHOSPHORUS: Phosphorus: 3.5 mg/dL (ref 2.5–4.6)

## 2017-12-21 LAB — GLUCOSE, CAPILLARY: GLUCOSE-CAPILLARY: 136 mg/dL — AB (ref 65–99)

## 2017-12-21 LAB — MAGNESIUM: MAGNESIUM: 1.5 mg/dL — AB (ref 1.7–2.4)

## 2017-12-21 MED ORDER — FLUCONAZOLE IN SODIUM CHLORIDE 200-0.9 MG/100ML-% IV SOLN
200.0000 mg | INTRAVENOUS | Status: DC
  Administered 2017-12-21 – 2017-12-26 (×6): 200 mg via INTRAVENOUS
  Filled 2017-12-21 (×6): qty 100

## 2017-12-21 MED ORDER — ONDANSETRON 4 MG PO TBDP
4.0000 mg | ORAL_TABLET | Freq: Four times a day (QID) | ORAL | Status: DC | PRN
  Administered 2017-12-28: 4 mg via ORAL
  Filled 2017-12-21: qty 1

## 2017-12-21 MED ORDER — MAGNESIUM SULFATE 2 GM/50ML IV SOLN
2.0000 g | Freq: Once | INTRAVENOUS | Status: AC
  Administered 2017-12-21: 2 g via INTRAVENOUS
  Filled 2017-12-21: qty 50

## 2017-12-21 MED ORDER — ONDANSETRON HCL 4 MG/2ML IJ SOLN
4.0000 mg | Freq: Four times a day (QID) | INTRAMUSCULAR | Status: DC | PRN
  Administered 2017-12-21 – 2017-12-25 (×3): 4 mg via INTRAVENOUS
  Filled 2017-12-21 (×3): qty 2

## 2017-12-21 MED ORDER — FLUCONAZOLE IN SODIUM CHLORIDE 200-0.9 MG/100ML-% IV SOLN
200.0000 mg | Freq: Once | INTRAVENOUS | Status: DC
  Filled 2017-12-21: qty 100

## 2017-12-21 MED ORDER — INSULIN ASPART 100 UNIT/ML ~~LOC~~ SOLN
0.0000 [IU] | Freq: Three times a day (TID) | SUBCUTANEOUS | Status: DC
  Administered 2017-12-21 – 2017-12-25 (×6): 1 [IU] via SUBCUTANEOUS
  Administered 2017-12-25: 2 [IU] via SUBCUTANEOUS
  Administered 2017-12-26 – 2017-12-28 (×6): 1 [IU] via SUBCUTANEOUS

## 2017-12-21 MED ORDER — SODIUM CHLORIDE 0.9% FLUSH
10.0000 mL | INTRAVENOUS | Status: DC | PRN
  Administered 2017-12-28: 10 mL
  Filled 2017-12-21: qty 40

## 2017-12-21 MED ORDER — SODIUM CHLORIDE 0.9% FLUSH
10.0000 mL | Freq: Two times a day (BID) | INTRAVENOUS | Status: DC
  Administered 2017-12-26 (×2): 10 mL

## 2017-12-21 NOTE — Progress Notes (Signed)
Pt's daughter, who lives in [place], called with several questions in regards to pt's diagnosis and treatment plan.  Daughter informed RN that pt had been on antibiotics prior to admission and was concerned about continuing antibiotics.  Dr. Alfredia Ferguson was notified of phone call and will follow up with daughter as necessary. Stacey Drain

## 2017-12-21 NOTE — Progress Notes (Signed)
PROGRESS NOTE    Jodi Underwood  OMV:[id number] DOB: Sep 17, 1945 DOA: 12/20/2017 PCP: Jodi Marble, MD   Brief Narrative:   HPI: Jodi Underwood is a 72 y.o. female with medical history significant of morbid obesity, chronic lymphedema, hypertension, hypothyroidism, CKD stage 3 sleep apnea, on oxygen on baseline who was sent from [facility] with complaints of generalized weakness, altered mental status and acute kidney injury.  Patient is a poor historian.  Patient was really not sure why they sent her to the emergency department .But she says she was not really feeling well since last few days.  She reported that she was not having good urinary output and was very thirsty .  Patient was admitted here and was discharged on 06/16/2017 after management for A. fib with RVR.  She was discharged on warfarin but currently she is not on anticoagulation and heart rate is controlled.  Patient found to have a creatinine of 4.35 on presentation with elevated BUN.  She also has mild elevated white cell counts.  She was admitted for acute on chronic kidney disease stage III, urinary tract infection, and sigmoid colitis.   Assessment & Plan:   Principal Problem:   Acute on chronic renal failure (HCC) Active Problems:   Morbid obesity ([place])   Essential hypertension   Asthma   Lymphedema   UTI (urinary tract infection)   Constipation   AKI (acute kidney injury) ([place])   A-fib (HCC)   Chronic systolic CHF (congestive heart failure) ([place])  AKI on CKD stage III -Her last creatinine was 1.77 on 06/16/2017.   -Baseline creatinine ranges from 1.4-2.3.   -Patient looks dry and dehydrated.  - She was on Bumetanide at the skilled nursing facility which has been held. -Continue to hydrate the patient.  Foley to be inserted for Strict I's/O's. -Acute kidney injury could be from volume depletion versus UTI versus obstructive pathology.   -Checked CT stone study and  showed Moderate nonspecific sigmoid colitis, which could be infectious or ischemic in etiology. No evidence of abscess or free fluid. Moderate paraumbilical hernia containing transverse colon and fat. Tiny nonobstructing calculi within the right renal collecting system. No evidence of ureteral calculi or hydronephrosis. -BUN/creatinine went from 66/4.35 and is now 70/4.22 -Will obtain a Nephrology consultation tomorrow if kidney function does not improve further with IV fluids.   -Checked urine sodium and was 17, urine creatinine was 142; FENa was 0.4% suggesting Pre-Renal Causes -Renal U/S showed Negative for age ultrasound appearance of both kidneys. Contracted urinary bladder with mild to moderate nonspecific wall thickening. -Continue to Monitor and Repeat CMP in AM   Fungal Urinary Tract Infection -It was reported that patient was been treated with ceftriaxone for urinary tract infection.   -Denies any fever or chills. -U/A showed hemoglobin, large leukocytes, rare bacteria, greater than 50 WBCs and urine culture showed thousand colonies of yeast -Patient was started empirically on IV ceftriaxone will continue for now given her possible sigmoid colitis and we will also start patient on IV fluconazole with pharmacy to dose for the Yeast in her urine  Sleep apnea obesity hypoventilation syndrome/chronic respiratory insufficiency:  -Recently started on oxygen.  -Currently her sats are fine with supplemental oxygen.   -Continue CPAP here.  Heart failure with reduced ejection fraction: -Echocardiogram on 11/18 shows ejection fraction of 50%, hypokinesis of anteroseptal, inferior and inferoseptal myocardium.   -BNP was 202.4 -Patient was on Bumetanide at nursing home. -Not on ACE or ARB  given Renal Insufficiency    -On Metoprolol Tartrate 25 mg po q12h -Diuresis will be held as patient is hypovolemic. -Continue with IVF Hydration but reduce rate to 100 mL/hr  Morbid Obesity/lymphedema:   -Patient is debilitated and bedbound due to her morbid obesity and lymphedema.   -She has not ambulated since November 2018.   -She says she has lost more than 100 pounds in last year. -[place] nurse for Lymphedema -Turn every 2 hours but patient refusing to be turned and refusing Bariatric Bed   History of A. Fib -Was discharged on Coumadin as per the records on November 2018.   -Currently not on any anticoagulation.   -EKG showed normal sinus rhythm. -On Amiodarone 200 mg po Daily and Metoprolol 25 mg po q12h.  Hypothyroidism  -TSH was 17.474 -? Compliance with Synthroid -Check Free T4 and T3 -C/w Levothyroxine 50 mcg po Daily for now and may need to titrate up  Hypertension -Currently blood pressure stable.   -Will resume her home meds and continue Amlodipine 5 mg po Daily and Metoprolol .  Asthma -Currently stable -Currently not in Exacerbation -C/w Montelukast 10 mg po Daily  -If necessary will add Nebs  Sigmoid Colitis  -Seen on CT Renal Stone Study -C/w Abx of IV Ceftriaxone and IV Metronidazole -Obtain Blood Cx x2 -Symptomatic Treatment and Anti-emetics -C/w IVF Rehydration of NS at 100 mL/hr -Continue Clear Liquid Diet -GI pathogen panel by PCR still pending -WBC worsened from 14.7 -> 20.1 -Continue to Monitor extremely Closely and repeat CBC in AM   Hypomagnesemia -Patient magnesium level this morning was 1.5 -Replete with IV mag sulfate 2 g--continue to monitor and replete as necessary -Repeat magnesium level in a.m.  Hyperglycemia in the setting of prediabetes -Patient's hemoglobin A1c was 6.1 -Add sensitive NovoLog sliding scale insulin before meals -Continue to monitor CBGs  DVT prophylaxis: Heparin 5,000 units sq q8h Code Status: FULL CODE Family Communication: No family present at bedside Disposition Plan: Remain Inpatient for current workup and treatment  Consultants:   None   Procedures: None   Antimicrobials:  Anti-infectives (From  admission, onward)   Start     Dose/Rate Route Frequency Ordered Stop   12/20/17 1800  metroNIDAZOLE (FLAGYL) IVPB 500 mg     500 mg 100 mL/hr over 60 Minutes Intravenous Every 8 hours 12/20/17 1716     12/20/17 1400  cefTRIAXone (ROCEPHIN) 2 g in sodium chloride 0.9 % 100 mL IVPB     2 g 200 mL/hr over 30 Minutes Intravenous Every 24 hours 12/20/17 1355       Subjective: And and examined at bedside and was not feeling well today.  States she is feeling nauseous and was not able to tell me more.  States her abdomen was hurting.  No chest pain or shortness of breath.  Denies any other complaints or concerns and states her leg swelling/lymphedema has been going on for over 10 years now.  Objective: Vitals:   12/20/17 1913 12/20/17 2112 12/21/17 0517 12/21/17 1416  BP: (!) 171/77 (!) 133/56 (!) 112/54 (!) 109/50  Pulse: 67 (!) 103 94 81  Resp: 20 20 20 20   Temp: 98 F (36.7 C) 98.9 F (37.2 C) 100 F (37.8 C) 98.4 F (36.9 C)  TempSrc: Oral Oral Oral Oral  SpO2: 98% 99% 92% 99%  Weight:      Height:        Intake/Output Summary (Last 24 hours) at 12/21/2017 1638 Last data filed at 12/21/2017 1339  Gross per 24 hour  Intake 2937.08 ml  Output 400 ml  Net 2537.08 ml   Filed Weights   12/20/17 1600  Weight: (!) 157.9 kg (348 lb 1.7 oz)   Examination: Physical Exam:  Constitutional: WN/WD morbidly obese AAF who appears uncomfortable  Eyes: Lids and conjunctivae normal, sclerae anicteric  ENMT: External Ears, Nose appear normal. Grossly normal hearing. Mucous membranes are dry. Neck: Appears normal, supple, no cervical masses, normal ROM, no appreciable thyromegaly; no JVD Respiratory: Diminished to auscultation bilaterally, no wheezing, rales, rhonchi or crackles. Normal respiratory effort and patient is not tachypenic. No accessory muscle use.  Cardiovascular: RRR, no murmurs / rubs / gallops. S1 and S2 auscultated. Chronic Lymphedema noted  Abdomen: Soft, tender to palpate,  Distended due to body habitus. No masses palpated. No appreciable hepatosplenomegaly. Bowel sounds positive x4.  GU: Deferred. Musculoskeletal: No Cyanosis; No joint deformities noted  Skin: Has chronic Lymphedema changes in the LE. No induration; Warm and dry.  Neurologic: CN 2-12 grossly intact with no focal deficits. Romberg sign and cerebellar reflexes not assessed.  Psychiatric: Normal judgment and insight. Alert and oriented x 3. Depressed mood and appropriate affect.   Data Reviewed: I have personally reviewed following labs and imaging studies  CBC: Recent Labs  Lab 12/20/17 1140 12/21/17 0845  WBC 14.7* 20.1*  NEUTROABS 8.9* 14.4  HGB 10.9* 9.8*  HCT 36.0 31.8*  MCV 87.0 86.2  PLT 300 680   Basic Metabolic Panel: Recent Labs  Lab 12/20/17 1140 12/21/17 0415  NA 135 135  K 5.0 4.8  CL 96* 99*  CO2 26 24  GLUCOSE 152* 157*  BUN 66* 70*  CREATININE 4.35* 4.22*  CALCIUM 8.3* 8.1*  MG  --  1.5*  PHOS  --  3.5   GFR: Estimated Creatinine Clearance: 18.8 mL/min (A) (by C-G formula based on SCr of 4.22 mg/dL (H)). Liver Function Tests: Recent Labs  Lab 12/20/17 1140 12/21/17 0415  AST 16 14*  ALT 11* 10*  ALKPHOS 79 73  BILITOT 0.5 1.0  PROT 6.8 5.9*  ALBUMIN 2.7* 2.4*   No results for input(s): LIPASE, AMYLASE in the last 168 hours. Recent Labs  Lab 12/20/17 1140  AMMONIA 16   Coagulation Profile: No results for input(s): INR, PROTIME in the last 168 hours. Cardiac Enzymes: No results for input(s): CKTOTAL, CKMB, CKMBINDEX, TROPONINI in the last 168 hours. BNP (last 3 results) No results for input(s): PROBNP in the last 8760 hours. HbA1C: Recent Labs    12/21/17 0415  HGBA1C 6.1*   CBG: No results for input(s): GLUCAP in the last 168 hours. Lipid Profile: No results for input(s): CHOL, HDL, LDLCALC, TRIG, CHOLHDL, LDLDIRECT in the last 72 hours. Thyroid Function Tests: Recent Labs    12/20/17 1140  TSH 17.474*   Anemia Panel: No  results for input(s): VITAMINB12, FOLATE, FERRITIN, TIBC, IRON, RETICCTPCT in the last 72 hours. Sepsis Labs: Recent Labs  Lab 12/20/17 1147  LATICACIDVEN 1.53    Recent Results (from the past 240 hour(s))  Culture, Urine     Status: Abnormal   Collection Time: 12/20/17  2:19 PM  Result Value Ref Range Status   Specimen Description   Final    URINE, RANDOM Performed at [facility], [place] [address], [place], [place] [address]    Special Requests   Final    NONE Performed at [facility], [place] [address] [place], [place] [address]    Culture 40,000 COLONIES/mL YEAST (  A)  Final   Report Status 12/21/2017 FINAL  Final     Radiology Studies: US Renal  Result Date: 12/20/2017 CLINICAL DATA:  72 year old female with acute kidney injury. Renal failure. UTI. EXAM: RENAL / URINARY TRACT ULTRASOUND COMPLETE COMPARISON:  Noncontrast CT Abdomen and Pelvis today reported separately. FINDINGS: Right Kidney: Length: 10.8 centimeters. Simple appearing 2.3 cm upper pole cyst (image 9). Echogenicity within normal limits. No solid mass or hydronephrosis visualized. Left Kidney: Length: 11.1 centimeters. Simple appearing 2.7 cm upper pole cyst (image 25). Echogenicity within normal limits. No solid mass or hydronephrosis visualized. Bladder: Decompressed urinary bladder with mild to moderate circumferential wall thickening (image 42). IMPRESSION: 1. Negative for age ultrasound appearance of both kidneys. 2. Contracted urinary bladder with mild to moderate nonspecific wall thickening. Electronically Signed   By: Genevie Ann M.D.   On: 12/20/2017 15:25   Dg Chest Portable 1 View  Result Date: 12/20/2017 CLINICAL DATA:  Shortness of breath.  Confusion. EXAM: PORTABLE CHEST 1 VIEW COMPARISON:  One-view chest x-ray 06/10/2017. FINDINGS: The heart size is exaggerated by low lung volumes. There is no edema or effusion. No focal airspace disease is present. Mild degenerative changes  are present at the shoulders, left greater than right. IMPRESSION: 1. Low lung volumes. 2. Acute cardiopulmonary disease. 3. Degenerative changes of the shoulders. Electronically Signed   By: San Morelle M.D.   On: 12/20/2017 10:55   Ct Renal Stone Study  Result Date: 12/20/2017 CLINICAL DATA:  Recurrent urinary tract infection. Nephrolithiasis. Chronic kidney disease. EXAM: CT ABDOMEN AND PELVIS WITHOUT CONTRAST TECHNIQUE: Multidetector CT imaging of the abdomen and pelvis was performed following the standard protocol without IV contrast. COMPARISON:  04/27/2016 FINDINGS: Lower Chest: No acute findings. Hepatobiliary: No hepatic masses identified. Gallbladder is unremarkable. Pancreas:  No mass or inflammatory changes. Spleen: Within normal limits in size and appearance. Adrenals/Urinary Tract: A few tiny calculi are seen within the right renal collecting system, largest in the right renal pelvis measuring 4 mm. No evidence of ureteral calculi or hydronephrosis. Unremarkable unopacified urinary bladder. Stomach/Bowel: Moderate wall thickening and mild pericolonic inflammatory changes are seen involving the sigmoid colon, although there is no evidence of colonic diverticular disease in this region. No other areas of bowel wall thickening identified. No evidence of abscess or free fluid. A moderate paraumbilical hernia is seen which contains a loop of transverse colon and fat. Vascular/Lymphatic: No pathologically enlarged lymph nodes. No abdominal aortic aneurysm. Reproductive: Prior hysterectomy noted. Adnexal regions are unremarkable in appearance. Other:  None. Musculoskeletal:  No suspicious bone lesions identified. IMPRESSION: Moderate nonspecific sigmoid colitis, which could be infectious or ischemic in etiology. No evidence of abscess or free fluid. Moderate paraumbilical hernia containing transverse colon and fat. Tiny nonobstructing calculi within the right renal collecting system. No evidence  of ureteral calculi or hydronephrosis. Electronically Signed   By: Earle Gell M.D.   On: 12/20/2017 15:23   Scheduled Meds: . amiodarone  200 mg Oral Daily  . amLODipine  5 mg Oral Daily  . guaiFENesin  600 mg Oral Q12H  . heparin  5,000 Units Subcutaneous Q8H  . insulin aspart  0-9 Units Subcutaneous TID WC  . levothyroxine  50 mcg Oral QAC breakfast  . Melatonin  3 mg Oral QHS  . metoprolol tartrate  25 mg Oral Q12H  . montelukast  10 mg Oral Daily  . sodium chloride flush  10-40 mL Intracatheter Q12H   Continuous Infusions: . sodium chloride 100 mL/hr at  12/21/17 1313  . cefTRIAXone (ROCEPHIN)  IV Stopped (12/21/17 1410)  . magnesium sulfate 1 - 4 g bolus IVPB    . metronidazole Stopped (12/21/17 1040)    LOS: 1 day   Kerney Elbe, DO [facility] Pager [phone]  If 7PM-7AM, please contact night-coverage [email or web address] Password [facility] 12/21/2017, 4:38 PM

## 2017-12-21 NOTE — Progress Notes (Signed)
Pharmacy Antibiotic Note  Jodi Underwood is a 72 y.o. female admitted on 12/20/2017 with sigmoid colitis. Previously being treated for UTI at facility with Rocephin; now found to have candiduria.  Pharmacy has been consulted for fluconazole dosing. New AKI noted  Plan:  Diflucan 200 mg IV q24 hr - Typical dosing in normal renal function is 200 mg/d, but using 3 mg/kg in a morbidly obese patient, this is a 50% dose reduction which is appropriate given her current CrCl  On Rocephin/Flagyl per MD for sigmoid colitis, may escalate to Zosyn if WBC continue to trend up.  Height: 5\' 6"  (167.6 cm) Weight: (!) 348 lb 1.7 oz (157.9 kg)(Simultaneous filing. User may not have seen previous data.) IBW/kg (Calculated) : 59.3  Temp (24hrs), Avg:98.8 F (37.1 C), Min:98 F (36.7 C), Max:100 F (37.8 C)  Recent Labs  Lab 12/20/17 1140 12/20/17 1147 12/21/17 0415 12/21/17 0845  WBC 14.7*  --   --  20.1*  CREATININE 4.35*  --  4.22*  --   LATICACIDVEN  --  1.53  --   --     Estimated Creatinine Clearance: 18.8 mL/min (A) (by C-G formula based on SCr of 4.22 mg/dL (H)).    Allergies  Allergen Reactions  . Ciprofloxacin Other (See Comments)    Hallucinations     Thank you for allowing pharmacy to be a part of this patient's care.  Reuel Boom, PharmD, BCPS [phone] 12/21/2017, 5:16 PM

## 2017-12-21 NOTE — Care Management Note (Signed)
Case Management Note  Patient Details  Name: Antonisha Waskey MRN: [id number] Date of Birth: 1945-07-24  Subjective/Objective:     Acute Chronic renal failure                Action/Plan:  Pt from [facility]  Expected Discharge Date:  (unknown)               Expected Discharge Plan:  Skilled Nursing Facility  In-House Referral:  Clinical Social Work  Discharge planning Services  CM Consult  Post Acute Care Choice:    Choice offered to:  Patient  DME Arranged:    DME Agency:     HH Arranged:    [place] Agency:     Status of Service:  Completed, signed off  If discussed at [employer], dates discussed:    Additional CommentsPurcell Mouton, RN 12/21/2017, 2:52 PM

## 2017-12-21 NOTE — Progress Notes (Signed)
Pt has refused turning this morning. Will continue to encourage. Pt also refused bariatric bed. Stacey Drain

## 2017-12-22 ENCOUNTER — Encounter (HOSPITAL_COMMUNITY): Payer: Self-pay | Admitting: Nephrology

## 2017-12-22 LAB — COMPREHENSIVE METABOLIC PANEL
ALK PHOS: 92 U/L (ref 38–126)
ALT: 11 U/L — ABNORMAL LOW (ref 14–54)
AST: 15 U/L (ref 15–41)
Albumin: 2.2 g/dL — ABNORMAL LOW (ref 3.5–5.0)
Anion gap: 11 (ref 5–15)
BUN: 78 mg/dL — AB (ref 6–20)
CALCIUM: 8.1 mg/dL — AB (ref 8.9–10.3)
CHLORIDE: 101 mmol/L (ref 101–111)
CO2: 24 mmol/L (ref 22–32)
CREATININE: 4.2 mg/dL — AB (ref 0.44–1.00)
GFR calc non Af Amer: 10 mL/min — ABNORMAL LOW (ref >60)
GFR, EST AFRICAN AMERICAN: 11 mL/min — AB (ref >60)
Glucose, Bld: 127 mg/dL — ABNORMAL HIGH (ref 65–99)
Potassium: 4.7 mmol/L (ref 3.5–5.1)
SODIUM: 136 mmol/L (ref 135–145)
Total Bilirubin: 0.6 mg/dL (ref 0.3–1.2)
Total Protein: 5.9 g/dL — ABNORMAL LOW (ref 6.5–8.1)

## 2017-12-22 LAB — CBC WITH DIFFERENTIAL/PLATELET
BASOS PCT: 0 %
Basophils Absolute: 0 10*3/uL (ref 0.0–0.1)
EOS ABS: 0 10*3/uL (ref 0.0–0.7)
EOS PCT: 0 %
HCT: 33.2 % — ABNORMAL LOW (ref 36.0–46.0)
HEMOGLOBIN: 10.2 g/dL — AB (ref 12.0–15.0)
LYMPHS PCT: 9 %
Lymphs Abs: 1.9 10*3/uL (ref 0.7–4.0)
MCH: 26.4 pg (ref 26.0–34.0)
MCHC: 30.7 g/dL (ref 30.0–36.0)
MCV: 86 fL (ref 78.0–100.0)
Monocytes Absolute: 3.4 10*3/uL — ABNORMAL HIGH (ref 0.1–1.0)
Monocytes Relative: 16 %
NEUTROS PCT: 75 %
Neutro Abs: 16 10*3/uL — ABNORMAL HIGH (ref 1.7–7.7)
PLATELETS: 287 10*3/uL (ref 150–400)
RBC: 3.86 MIL/uL — AB (ref 3.87–5.11)
RDW: 14.7 % (ref 11.5–15.5)
WBC MORPHOLOGY: INCREASED
WBC: 21.3 10*3/uL — AB (ref 4.0–10.5)

## 2017-12-22 LAB — GLUCOSE, CAPILLARY
GLUCOSE-CAPILLARY: 120 mg/dL — AB (ref 65–99)
GLUCOSE-CAPILLARY: 119 mg/dL — AB (ref 65–99)
GLUCOSE-CAPILLARY: 133 mg/dL — AB (ref 65–99)
Glucose-Capillary: 123 mg/dL — ABNORMAL HIGH (ref 65–99)
Glucose-Capillary: 142 mg/dL — ABNORMAL HIGH (ref 65–99)

## 2017-12-22 LAB — MAGNESIUM: Magnesium: 2.1 mg/dL (ref 1.7–2.4)

## 2017-12-22 LAB — URINE CULTURE: Culture: 60000 — AB

## 2017-12-22 LAB — T3: T3, Total: 51 ng/dL — ABNORMAL LOW (ref 71–180)

## 2017-12-22 LAB — PHOSPHORUS: PHOSPHORUS: 4.5 mg/dL (ref 2.5–4.6)

## 2017-12-22 MED ORDER — HYDRALAZINE HCL 20 MG/ML IJ SOLN: 10.0000 mg | INTRAMUSCULAR | Status: DC | PRN

## 2017-12-22 MED ORDER — SODIUM CHLORIDE 0.9 % IV BOLUS
1500.0000 mL | Freq: Once | INTRAVENOUS | Status: AC
  Administered 2017-12-22: 1500 mL via INTRAVENOUS

## 2017-12-22 MED ORDER — TRAMADOL HCL 50 MG PO TABS
50.0000 mg | ORAL_TABLET | Freq: Four times a day (QID) | ORAL | Status: DC | PRN
  Administered 2017-12-22 – 2017-12-26 (×3): 50 mg via ORAL
  Filled 2017-12-22 (×3): qty 1

## 2017-12-22 MED ORDER — PIPERACILLIN-TAZOBACTAM IN DEX 2-0.25 GM/50ML IV SOLN
2.2500 g | Freq: Three times a day (TID) | INTRAVENOUS | Status: DC
  Administered 2017-12-22 – 2017-12-25 (×9): 2.25 g via INTRAVENOUS
  Filled 2017-12-22 (×11): qty 50

## 2017-12-22 NOTE — Progress Notes (Signed)
Pharmacy Antibiotic Note  Jodi Underwood is a 72 y.o. female admitted on 12/20/2017 with intra-abdominal infection.  Pharmacy has been consulted for piperacillin/tazobactam dosing.  Patient admitted with sigmoid colitis on 6/6. Patient was previously being treated for UTI at facility with ceftriaxone. Patient is currently on ceftriaxone + metronidazole for IAI and fluconazole for candiduria.  Antibiotics being escalated today. CTX and metronidazole discontinued and pharmacy consulted for piperacillin/tazobactam dosing.   Plan:  Given CrCl < 20 mL/min, will order piperacillin/tazobactam 2.25 g IV q8h   Height: 5\' 6"  (167.6 cm) Weight: (!) 348 lb 1.7 oz (157.9 kg)(Simultaneous filing. User may not have seen previous data.) IBW/kg (Calculated) : 59.3  Temp (24hrs), Avg:98.4 F (36.9 C), Min:98.2 F (36.8 C), Max:98.7 F (37.1 C)  Recent Labs  Lab 12/20/17 1140 12/20/17 1147 12/21/17 0415 12/21/17 0845 12/22/17 0420  WBC 14.7*  --   --  20.1* 21.3*  CREATININE 4.35*  --  4.22*  --  4.20*  LATICACIDVEN  --  1.53  --   --   --     Estimated Creatinine Clearance: 18.9 mL/min (A) (by C-G formula based on SCr of 4.2 mg/dL (H)).    Allergies  Allergen Reactions  . Ciprofloxacin Other (See Comments)    Hallucinations    Antimicrobials this admission: Piperacillin/tazobactam 6/8 >>  fluconazole 6/7 >>  Ceftriaxone 6/7 >> 6/8 Metronidazole 6/7 >> 6/8  Dose adjustments this admission:  Microbiology results: 6/7 BCx: Sent 6/6 UCx: 40K yeast   Thank you for allowing pharmacy to be a part of this patient's care.  Lenis Noon, PharmD, BCPS Clinical Pharmacist 12/22/2017 10:04 AM

## 2017-12-22 NOTE — Progress Notes (Signed)
PROGRESS NOTE    Jodi Underwood  DGU:[id number] DOB: 14-Oct-1945 DOA: 12/20/2017 PCP: Jodi Marble, MD   Brief Narrative:   HPI: Jodi Underwood is a 72 y.o. female with medical history significant of morbid obesity, chronic lymphedema, hypertension, hypothyroidism, CKD stage 3 sleep apnea, on oxygen on baseline who was sent from [facility] with complaints of generalized weakness, altered mental status and acute kidney injury.  Patient is a poor historian.  Patient was really not sure why they sent her to the emergency department .But she says she was not really feeling well since last few days.  She reported that she was not having good urinary output and was very thirsty .  Patient was admitted here and was discharged on 06/16/2017 after management for A. fib with RVR.  She was discharged on warfarin but currently she is not on anticoagulation and heart rate is controlled.  Patient found to have a creatinine of 4.35 on presentation with elevated BUN.  She also has mild elevated white cell counts.  She was admitted for acute on chronic kidney disease stage III, urinary tract infection, and sigmoid colitis.  Assessment & Plan:   Principal Problem:   Acute on chronic renal failure (HCC) Active Problems:   Morbid obesity ([place])   Essential hypertension   Asthma   Lymphedema   UTI (urinary tract infection)   Constipation   AKI (acute kidney injury) ([place])   A-fib (HCC)   Chronic systolic CHF (congestive heart failure) ([place])  AKI on CKD stage III -Her last creatinine was 1.77 on 06/16/2017.   -Baseline creatinine ranges from 1.4-2.3.   -Patient looks dry and dehydrated.  -She was on Bumetanide at the skilled nursing facility which has been held. -Continue to hydrate the patient.  Foley to be inserted for Strict I's/O's. -Acute kidney injury could be from volume depletion versus UTI versus obstructive pathology.   -Checked CT stone study and  showed Moderate nonspecific sigmoid colitis, which could be infectious or ischemic in etiology. No evidence of abscess or free fluid. Moderate paraumbilical hernia containing transverse colon and fat. Tiny nonobstructing calculi within the right renal collecting system. No evidence of ureteral calculi or hydronephrosis. -BUN/creatinine went from 66/4.35 and is now 78/4.20 -Obtained a Nephrology consultation today as Kidney function not really improving  -Checked urine sodium and was 17, urine creatinine was 142; FENa was 0.4% suggesting Pre-Renal Causes -Renal U/S showed Negative for age ultrasound appearance of both kidneys. Contracted urinary bladder with mild to moderate nonspecific wall thickening. -Continue to Monitor and Repeat CMP in AM   Fungal Urinary Tract Infection -It was reported that patient was been treated with ceftriaxone for urinary tract infection.   -Denies any fever or chills. -U/A showed hemoglobin, large leukocytes, rare bacteria, greater than 50 WBCs and urine culture showed thousand colonies of yeast -Patient was started empirically on IV ceftriaxone will continue for now given her possible sigmoid colitis and we will also start patient on IV fluconazole with pharmacy to dose for the Yeast in her urine -Repeat Urine Culture showed 60,000 CFU of Yeast  Sleep Apnea / Obesity Hypoventilation syndrome/chronic respiratory insufficiency:  -Recently started on oxygen.  -Currently her sats are fine with supplemental oxygen.   -Continue CPAP here.  Heart failure with reduced ejection fraction: -Echocardiogram on 11/18 shows ejection fraction of 50%, hypokinesis of anteroseptal, inferior and inferoseptal myocardium.   -BNP was 202.4 -Patient was on Bumetanide at nursing home. -Not on ACE or  ARB given Renal Insufficiency    -On Metoprolol Tartrate 25 mg po q12h -Diuresis will be held as patient is hypovolemic. -Patient is +3.954 Liters and Weight is 348 lbs -Continue with  IVF Hydration but reduce rate to 100 mL/hr  Morbid Obesity/Lymphedema:  -Patient is debilitated and bedbound due to her morbid obesity and lymphedema.   -She has not ambulated since November 2018.   -She says she has lost more than 100 pounds in last year. -[place] nurse for Lymphedema -Turn every 2 hours but patient refusing to be turned and refusing Bariatric Bed   History of A. Fib -Was discharged on Coumadin as per the records on November 2018.   -Currently not on any anticoagulation.   -EKG showed normal sinus rhythm. -On Amiodarone 200 mg po Daily and Metoprolol 25 mg po q12h.  Hypothyroidism  -TSH was 17.474 -? Compliance with Synthroid -Checked Free T4 and was 0.84 and T3 was 51 (low) -C/w Levothyroxine 50 mcg po Daily for now and may need to titrate up  Hypertension -Currently blood pressure stable.   -Will resume her home meds and continue Amlodipine 5 mg po Daily and Metoprolol .  Asthma -Currently stable -Currently not in Exacerbation -C/w Montelukast 10 mg po Daily  -If necessary will add Nebs  Sigmoid Colitis  -Seen on CT Renal Stone Study -Abx of IV Ceftriaxone and IV Metronidazole changed to IV Zosyn -Obtain Blood Cx x2 and showed NGTD <24 Hours -Symptomatic Treatment and Anti-emetics -C/w IVF Rehydration of NS at 100 mL/hr -Continue Clear Liquid Diet for now  -GI pathogen panel by PCR still pending; Having diarrhea and had at least 3 bowel movements  -WBC worsened from 14.7 -> 20.1 -> 21.3 -Continue to Monitor extremely Closely and repeat CBC in AM   Hypomagnesemia -Patient magnesium level this morning was 1.5 and improved to 2.1 -Replete with IV mag sulfate 2 g -Continue to monitor and replete as necessary -Repeat magnesium level in a.m.  Hyperglycemia in the setting of Prediabetes -Patient's hemoglobin A1c was 6.1 -Add sensitive NovoLog sliding scale insulin before meals -Continue to monitor CBGs; CBG's ranging from 119-136  Normocytic  Anemia -Patient's Hb/Hct went from 9.8/31.8 -> 10.2/33.2 -Continue to Monitor for S/Sx of Infection -Repeat CBC in AM   DVT prophylaxis: Heparin 5,000 units sq q8h Code Status: FULL CODE Family Communication: No family present at bedside Disposition Plan: Remain Inpatient for current workup and treatment  Consultants:   None   Procedures: None   Antimicrobials:  Anti-infectives (From admission, onward)   Start     Dose/Rate Route Frequency Ordered Stop   12/21/17 1800  fluconazole (DIFLUCAN) IVPB 200 mg  Status:  Discontinued     200 mg 100 mL/hr over 60 Minutes Intravenous  Once 12/21/17 1704 12/21/17 1715   12/21/17 1800  fluconazole (DIFLUCAN) IVPB 200 mg     200 mg 100 mL/hr over 60 Minutes Intravenous Every 24 hours 12/21/17 1715     12/20/17 1800  metroNIDAZOLE (FLAGYL) IVPB 500 mg     500 mg 100 mL/hr over 60 Minutes Intravenous Every 8 hours 12/20/17 1716     12/20/17 1400  cefTRIAXone (ROCEPHIN) 2 g in sodium chloride 0.9 % 100 mL IVPB     2 g 200 mL/hr over 30 Minutes Intravenous Every 24 hours 12/20/17 1355       Subjective: And and examined at bedside and still not feeling as well but looked better than yesterday. Complaining of pain all over today. No CP  or SOB. Has Abdominal Pain and having multiple bowel movements. More cooperative with nursing today.   Objective: Vitals:   12/21/17 1416 12/21/17 2129 12/21/17 2133 12/22/17 0549  BP: (!) 109/50 (!) 139/56 (!) 139/56 128/60  Pulse: 81 87 87 88  Resp: 20  20 20   Temp: 98.4 F (36.9 C)  98.2 F (36.8 C) 98.7 F (37.1 C)  TempSrc: Oral  Oral Oral  SpO2: 99%  97% 99%  Weight:      Height:        Intake/Output Summary (Last 24 hours) at 12/22/2017 9735 Last data filed at 12/22/2017 0500 Gross per 24 hour  Intake 3255.83 ml  Output 351 ml  Net 2904.83 ml   Filed Weights   12/20/17 1600  Weight: (!) 157.9 kg (348 lb 1.7 oz)   Examination: Physical Exam:  Constitutional: Well-nourished,  well-developed morbidly obese African-American female who appears uncomfortable but is more cooperative today Eyes: Lids and conjunctive are normal.  Sclera anicteric. ENMT: External ears and nose appear normal grossly normal hearing. Neck: Supple with no JVD Respiratory: Auscultation bilaterally with no appreciable wheezing, rales, rhonchi.  Normal respiratory effort and patient not tachypneic using accessory muscle breathe Cardiovascular: Regular rate and rhythm.  No appreciable murmurs, rubs, gallops.  Chronic lymphedema noted Abdomen: Soft, tender to palpate in the lower adamant.  Distended secondary body habitus.  Bowel sounds present all 4 quadrants GU: Deferred Musculoskeletal: No contractures or cyanosis noted.  No joint deformities Skin: Warm and dry.  Has chronic lymphedema changes in the lower extremity Neurologic: Cranial nerves II through XII grossly intact no appreciable focal deficits. Psychiatric: Normal judgment and insight.  Awake and alert and oriented x3.  Depressed appearing mood.  Data Reviewed: I have personally reviewed following labs and imaging studies  CBC: Recent Labs  Lab 12/20/17 1140 12/21/17 0845 12/22/17 0420  WBC 14.7* 20.1* 21.3*  NEUTROABS 8.9* 14.4 16.0*  HGB 10.9* 9.8* 10.2*  HCT 36.0 31.8* 33.2*  MCV 87.0 86.2 86.0  PLT 300 294 329   Basic Metabolic Panel: Recent Labs  Lab 12/20/17 1140 12/21/17 0415 12/22/17 0420  NA 135 135 136  K 5.0 4.8 4.7  CL 96* 99* 101  CO2 26 24 24   GLUCOSE 152* 157* 127*  BUN 66* 70* 78*  CREATININE 4.35* 4.22* 4.20*  CALCIUM 8.3* 8.1* 8.1*  MG  --  1.5* 2.1  PHOS  --  3.5 4.5   GFR: Estimated Creatinine Clearance: 18.9 mL/min (A) (by C-G formula based on SCr of 4.2 mg/dL (H)). Liver Function Tests: Recent Labs  Lab 12/20/17 1140 12/21/17 0415 12/22/17 0420  AST 16 14* 15  ALT 11* 10* 11*  ALKPHOS 79 73 92  BILITOT 0.5 1.0 0.6  PROT 6.8 5.9* 5.9*  ALBUMIN 2.7* 2.4* 2.2*   No results for  input(s): LIPASE, AMYLASE in the last 168 hours. Recent Labs  Lab 12/20/17 1140  AMMONIA 16   Coagulation Profile: No results for input(s): INR, PROTIME in the last 168 hours. Cardiac Enzymes: No results for input(s): CKTOTAL, CKMB, CKMBINDEX, TROPONINI in the last 168 hours. BNP (last 3 results) No results for input(s): PROBNP in the last 8760 hours. HbA1C: Recent Labs    12/21/17 0415  HGBA1C 6.1*   CBG: Recent Labs  Lab 12/21/17 1729 12/21/17 2120 12/22/17 0752  GLUCAP 136* 123* 120*   Lipid Profile: No results for input(s): CHOL, HDL, LDLCALC, TRIG, CHOLHDL, LDLDIRECT in the last 72 hours. Thyroid Function Tests: Recent Labs  12/20/17 1140 12/21/17 1641  TSH 17.474*  --   FREET4  --  0.84   Anemia Panel: No results for input(s): VITAMINB12, FOLATE, FERRITIN, TIBC, IRON, RETICCTPCT in the last 72 hours. Sepsis Labs: Recent Labs  Lab 12/20/17 1147  LATICACIDVEN 1.53    Recent Results (from the past 240 hour(s))  Culture, Urine     Status: Abnormal   Collection Time: 12/20/17  2:19 PM  Result Value Ref Range Status   Specimen Description   Final    URINE, RANDOM Performed at [facility], [place] [address] [place], [place] [address]    Special Requests   Final    NONE Performed at [facility], [place] [address] [place], [place] [address]    Culture 40,000 COLONIES/mL YEAST (A)  Final   Report Status 12/21/2017 FINAL  Final     Radiology Studies: US Renal  Result Date: 12/20/2017 CLINICAL DATA:  72 year old female with acute kidney injury. Renal failure. UTI. EXAM: RENAL / URINARY TRACT ULTRASOUND COMPLETE COMPARISON:  Noncontrast CT Abdomen and Pelvis today reported separately. FINDINGS: Right Kidney: Length: 10.8 centimeters. Simple appearing 2.3 cm upper pole cyst (image 9). Echogenicity within normal limits. No solid mass or hydronephrosis visualized. Left Kidney: Length: 11.1 centimeters. Simple appearing 2.7 cm  upper pole cyst (image 25). Echogenicity within normal limits. No solid mass or hydronephrosis visualized. Bladder: Decompressed urinary bladder with mild to moderate circumferential wall thickening (image 42). IMPRESSION: 1. Negative for age ultrasound appearance of both kidneys. 2. Contracted urinary bladder with mild to moderate nonspecific wall thickening. Electronically Signed   By: Genevie Ann M.D.   On: 12/20/2017 15:25   Dg Chest Portable 1 View  Result Date: 12/20/2017 CLINICAL DATA:  Shortness of breath.  Confusion. EXAM: PORTABLE CHEST 1 VIEW COMPARISON:  One-view chest x-ray 06/10/2017. FINDINGS: The heart size is exaggerated by low lung volumes. There is no edema or effusion. No focal airspace disease is present. Mild degenerative changes are present at the shoulders, left greater than right. IMPRESSION: 1. Low lung volumes. 2. Acute cardiopulmonary disease. 3. Degenerative changes of the shoulders. Electronically Signed   By: San Morelle M.D.   On: 12/20/2017 10:55   Ct Renal Stone Study  Result Date: 12/20/2017 CLINICAL DATA:  Recurrent urinary tract infection. Nephrolithiasis. Chronic kidney disease. EXAM: CT ABDOMEN AND PELVIS WITHOUT CONTRAST TECHNIQUE: Multidetector CT imaging of the abdomen and pelvis was performed following the standard protocol without IV contrast. COMPARISON:  04/27/2016 FINDINGS: Lower Chest: No acute findings. Hepatobiliary: No hepatic masses identified. Gallbladder is unremarkable. Pancreas:  No mass or inflammatory changes. Spleen: Within normal limits in size and appearance. Adrenals/Urinary Tract: A few tiny calculi are seen within the right renal collecting system, largest in the right renal pelvis measuring 4 mm. No evidence of ureteral calculi or hydronephrosis. Unremarkable unopacified urinary bladder. Stomach/Bowel: Moderate wall thickening and mild pericolonic inflammatory changes are seen involving the sigmoid colon, although there is no evidence of  colonic diverticular disease in this region. No other areas of bowel wall thickening identified. No evidence of abscess or free fluid. A moderate paraumbilical hernia is seen which contains a loop of transverse colon and fat. Vascular/Lymphatic: No pathologically enlarged lymph nodes. No abdominal aortic aneurysm. Reproductive: Prior hysterectomy noted. Adnexal regions are unremarkable in appearance. Other:  None. Musculoskeletal:  No suspicious bone lesions identified. IMPRESSION: Moderate nonspecific sigmoid colitis, which could be infectious or ischemic in etiology. No evidence of abscess or free fluid. Moderate  paraumbilical hernia containing transverse colon and fat. Tiny nonobstructing calculi within the right renal collecting system. No evidence of ureteral calculi or hydronephrosis. Electronically Signed   By: Earle Gell M.D.   On: 12/20/2017 15:23   Scheduled Meds: . amiodarone  200 mg Oral Daily  . amLODipine  5 mg Oral Daily  . guaiFENesin  600 mg Oral Q12H  . heparin  5,000 Units Subcutaneous Q8H  . insulin aspart  0-9 Units Subcutaneous TID WC  . levothyroxine  50 mcg Oral QAC breakfast  . Melatonin  3 mg Oral QHS  . metoprolol tartrate  25 mg Oral Q12H  . montelukast  10 mg Oral Daily  . sodium chloride flush  10-40 mL Intracatheter Q12H   Continuous Infusions: . sodium chloride 100 mL/hr at 12/22/17 0410  . cefTRIAXone (ROCEPHIN)  IV Stopped (12/21/17 1410)  . fluconazole (DIFLUCAN) IV Stopped (12/21/17 2230)  . metronidazole Stopped (12/22/17 0152)    LOS: 2 days   Kerney Elbe, DO [facility] Pager [phone]  If 7PM-7AM, please contact night-coverage [email or web address] Password [email or web address] 12/22/2017, 8:14 AM

## 2017-12-22 NOTE — Consult Note (Signed)
Renal Service Consult Note [facility]  Jodi Underwood 12/22/2017 Sol [place]azing Requesting Physician:  Dr Alfredia Ferguson  Reason for Consult:  Acute/ CKD HPI: The patient is a 72 y.o. year-old with hx of MO, lymphedema, hypertension hypothyroid CKD stage 3 OSA on O2 at home, sent from SNF to hospital with c/o AMS, gen'd weakness. Creat was ^'d in ED 4.8, baseline 1.5.  CT showed sigmoid colitis and wbc was ^. Pt admitted and started on iv abx and IVF's for vol depletion/ AKI.      Last admit was dec 2018 for afib with rapid vent respnose.   UA here showed > 50 wbc/ 11-20 rbc/ 0-5 epis, rare bact. UNa 17 and UCreat 142 BP's' on admission wer 110/50 range. Current meds are amio 200/ norvasc 5/ heparin sq/ insluin/ T4/ lopressor 25 bid/ IV zosyn and diflucan IV. NS at 100/ hr.   Patient w/o specific c/o's.  Has been "in and out" of SNF's since Nov last year.  Is not ambulatory. Divorced.    ROS  denies CP  no joint pain   no HA  no blurry vision  no rash  no dysuria  no difficulty voiding   Past Medical History  Past Medical History:  Diagnosis Date  . Anemia   . Arthritis   . Asthma   . Atrial fibrillation ([place])   . Degenerative disc disease   . Diabetes mellitus   . Diabetic neuropathy ([place])   . Edema extremities   . Hypertension   . Lymphedema   . Pneumonia   . Urinary incontinence    Past Surgical History  Past Surgical History:  Procedure Laterality Date  . ABDOMINAL HYSTERECTOMY     COMPLETE   Family History  Family History  Adopted: Yes   Social History  reports that she has never smoked. She has never used smokeless tobacco. She reports that she drinks alcohol. She reports that she does not use drugs. Allergies  Allergies  Allergen Reactions  . Ciprofloxacin Other (See Comments)    Hallucinations   Home medications Prior to Admission medications   Medication Sig Start Date End Date Taking? Authorizing Provider  acetaminophen  (TYLENOL) 325 MG tablet Take 650 mg by mouth every 6 (six) hours as needed (For pain.).   Yes [provider]  albuterol (ACCUNEB) 0.63 MG/3ML nebulizer solution Take 1 ampule by nebulization every 6 (six) hours as needed for wheezing or shortness of breath.   Yes [provider]  amiodarone (PACERONE) 200 MG tablet Take 200 mg by mouth daily. 08/25/17  Yes [provider]  amLODipine (NORVASC) 5 MG tablet Take 5 mg by mouth daily.   Yes [provider]  bumetanide (BUMEX) 2 MG tablet Take 2 mg by mouth daily. 11/09/17  Yes [provider]  cefTRIAXone (ROCEPHIN) 1 g SOLR injection Inject 1 g into the vein at bedtime.   Yes [provider]  cetirizine (ZYRTEC ALLERGY) 10 MG tablet Take 10 mg by mouth daily.   Yes [provider]  cyclobenzaprine (FLEXERIL) 10 MG tablet Take 10 mg by mouth every 6 (six) hours as needed for muscle spasms.   Yes [provider]  docusate sodium (COLACE) 100 MG capsule Take 100 mg by mouth every other day.   Yes [provider]  fluticasone (FLONASE) 50 MCG/ACT nasal spray Place 2 sprays into both nostrils daily. Patient taking differently: Place 1 spray into both nostrils every 12 (twelve) hours.  10/20/16  Yes  Patrecia Pour, Christean Grief, MD  guaiFENesin (MUCINEX) 600 MG 12 hr tablet Take 600 mg by mouth every 12 (twelve) hours.   Yes [provider]  levothyroxine (SYNTHROID, LEVOTHROID) 50 MCG tablet Take 50 mcg by mouth daily. 11/08/17  Yes [provider]  Melatonin 3 MG TABS Take 3 mg by mouth at bedtime.   Yes [provider]  metoprolol tartrate (LOPRESSOR) 25 MG tablet Take 25 mg by mouth every 12 (twelve) hours. 11/08/17  Yes [provider]  montelukast (SINGULAIR) 10 MG tablet Take 10 mg by mouth daily. 08/24/17  Yes [provider]  ondansetron (ZOFRAN) 8 MG tablet Take by mouth every 8 (eight) hours as needed for nausea or vomiting.   Yes [provider]  potassium chloride (K-DUR,KLOR-CON) 10 MEQ tablet Take 10 mEq by mouth 3 (three) times daily.   Yes [provider]  promethazine (PHENERGAN) 25 MG tablet Take 25 mg by mouth every 6 (six) hours as needed for nausea or vomiting.   Yes [provider]  senna (SENOKOT) 8.6 MG TABS tablet Take 1 tablet (8.6 mg total) by mouth daily. Patient taking differently: Take 1 tablet by mouth every other day.  06/17/17  Yes Patrecia Pour, MD  simethicone (MYLICON) 80 MG chewable tablet Chew 80 mg by mouth every 6 (six) hours as needed for flatulence.   Yes [provider]  traMADol (ULTRAM) 50 MG tablet Take 50 mg by mouth every 6 (six) hours as needed (For pain.).   Yes [provider]   Liver Function Tests Recent Labs  Lab 12/20/17 1140 12/21/17 0415 12/22/17 0420  AST 16 14* 15  ALT 11* 10* 11*  ALKPHOS 79 73 92  BILITOT 0.5 1.0 0.6  PROT 6.8 5.9* 5.9*  ALBUMIN 2.7* 2.4* 2.2*   No results for input(s): LIPASE, AMYLASE in the last 168 hours. CBC Recent Labs  Lab 12/20/17 1140 12/21/17 0845 12/22/17 0420  WBC 14.7* 20.1* 21.3*  NEUTROABS 8.9* 14.4 16.0*  HGB 10.9* 9.8* 10.2*  HCT 36.0 31.8* 33.2*  MCV 87.0 86.2 86.0  PLT 300 294 629   Basic Metabolic Panel Recent Labs  Lab 12/20/17 1140 12/21/17 0415 12/22/17 0420  NA 135 135 136  K 5.0 4.8 4.7  CL 96* 99* 101  CO2 _0 GLUCOSE 152* 157* 127*  BUN 66* 70* 78*  CREATININE 4.35* 4.22* 4.20*  CALCIUM 8.3* 8.1* 8.1*  PHOS  --  3.5 4.5   Iron/TIBC/Ferritin/ %Sat No results found for: IRON, TIBC, FERRITIN, IRONPCTSAT  Vitals:   12/21/17 2129 12/21/17 2133 12/22/17 0549 12/22/17 1216  BP: (!) 139/56 (!) 139/56 128/60 (!) 112/52  Pulse: 87 87 88 82  Resp:  _1 Temp:  98.2 F (36.8 C) 98.7 F (37.1 C) 97.8 F (36.6 C)  TempSrc:  Oral Oral Oral  SpO2:  97% 99% 98%  Weight:      Height:       Exam Gen alert morbidly obese lying flat unable to turn  No rash,  cyanosis or gangrene Sclera anicteric, throat clear  No jvd or bruits Chest clear bilat to bases RRR no MRG  Abd soft ntnd no mass or ascites +bs morbid obesity GU foley in place draining clear urine MS no joint effusions or deformity Ext chrnoic skin changes, skin wrinkling w/o sig pitting edema, no wounds or ulcers Neuro is alert, Ox 3, gen'd weakness     Home meds:  - norvasc  5/ lopressor 25 bid/ bumex 2 mg qd  - amio 200 qd  - synthroid/ singulair/ Kdur/ ultram prn  - vitamins/ supplements/ prn's   date    Creat   notes  2011   1.0  2014   1.2  2016   1.6- 2.7 aki  2017   0.9- 1.2   mar/ apr 2018  1.0- 1.20 May 2017  1.4- 1.8 32- 40 egfr  jun 6- 8, 2019  4.3 - 4.5    renal US 12/20/17 > 10.8/ 11.1 cm kidneys no hydro, bladder decompressed  UA 6/6  > 50 wbc/ 11-20 rbc/ 0-5 epis, rare bact, protein 30  UNa 17 and UCreat 142  cxr 6.6 - no active disease   Impression: 1 Acute on CKD 3 - baseline creat 1.5- 1.8 prob due to HTN.  AKI most likely hemodynamic due to vol depletion/ relative hypotension/ acute infection vs ischemia/ ATN. Making urine. Would like to BP's to come up some, and still has room for volume. Will dc bp meds and bolus NS. Poor overall prognosis.  2 UTI fungal - on diflucan 3 Diverticulitis - iv zosyn 4 morbid obesity 5 debility - significant and chronic 6 atrial fib on amio 7 hypertension  8 OSA   Plan - as above  Kelly Splinter MD [employer] pager [phone]   12/22/2017, 7:03 PM

## 2017-12-23 DIAGNOSIS — K5900 Constipation, unspecified: Secondary | ICD-10-CM

## 2017-12-23 LAB — CBC WITH DIFFERENTIAL/PLATELET
Basophils Absolute: 0 10*3/uL (ref 0.0–0.1)
Basophils Relative: 0 %
Eosinophils Absolute: 0.2 10*3/uL (ref 0.0–0.7)
Eosinophils Relative: 1 %
HEMATOCRIT: 32.5 % — AB (ref 36.0–46.0)
HEMOGLOBIN: 10 g/dL — AB (ref 12.0–15.0)
LYMPHS ABS: 1.4 10*3/uL (ref 0.7–4.0)
LYMPHS PCT: 8 %
MCH: 26.4 pg (ref 26.0–34.0)
MCHC: 30.8 g/dL (ref 30.0–36.0)
MCV: 85.8 fL (ref 78.0–100.0)
MONOS PCT: 12 %
Monocytes Absolute: 2 10*3/uL — ABNORMAL HIGH (ref 0.1–1.0)
NEUTROS ABS: 13.4 10*3/uL — AB (ref 1.7–7.7)
Neutrophils Relative %: 79 %
Platelets: 266 10*3/uL (ref 150–400)
RBC: 3.79 MIL/uL — ABNORMAL LOW (ref 3.87–5.11)
RDW: 14.9 % (ref 11.5–15.5)
WBC: 17 10*3/uL — ABNORMAL HIGH (ref 4.0–10.5)

## 2017-12-23 LAB — COMPREHENSIVE METABOLIC PANEL
ALK PHOS: 90 U/L (ref 38–126)
ALT: 13 U/L — ABNORMAL LOW (ref 14–54)
ANION GAP: 11 (ref 5–15)
AST: 13 U/L — ABNORMAL LOW (ref 15–41)
Albumin: 2.1 g/dL — ABNORMAL LOW (ref 3.5–5.0)
BILIRUBIN TOTAL: 0.6 mg/dL (ref 0.3–1.2)
BUN: 77 mg/dL — ABNORMAL HIGH (ref 6–20)
CALCIUM: 7.9 mg/dL — AB (ref 8.9–10.3)
CO2: 20 mmol/L — ABNORMAL LOW (ref 22–32)
Chloride: 103 mmol/L (ref 101–111)
Creatinine, Ser: 4.07 mg/dL — ABNORMAL HIGH (ref 0.44–1.00)
GFR calc non Af Amer: 10 mL/min — ABNORMAL LOW (ref >60)
GFR, EST AFRICAN AMERICAN: 12 mL/min — AB (ref >60)
GLUCOSE: 130 mg/dL — AB (ref 65–99)
Potassium: 4.3 mmol/L (ref 3.5–5.1)
Sodium: 134 mmol/L — ABNORMAL LOW (ref 135–145)
TOTAL PROTEIN: 5.8 g/dL — AB (ref 6.5–8.1)

## 2017-12-23 LAB — GASTROINTESTINAL PANEL BY PCR, STOOL (REPLACES STOOL CULTURE)
ADENOVIRUS F40/41: NOT DETECTED
ASTROVIRUS: NOT DETECTED
CAMPYLOBACTER SPECIES: NOT DETECTED
CRYPTOSPORIDIUM: NOT DETECTED
CYCLOSPORA CAYETANENSIS: NOT DETECTED
ENTAMOEBA HISTOLYTICA: NOT DETECTED
ENTEROPATHOGENIC E COLI (EPEC): NOT DETECTED
Enteroaggregative E coli (EAEC): NOT DETECTED
Enterotoxigenic E coli (ETEC): NOT DETECTED
Giardia lamblia: NOT DETECTED
Norovirus GI/GII: NOT DETECTED
PLESIMONAS SHIGELLOIDES: NOT DETECTED
Rotavirus A: NOT DETECTED
SAPOVIRUS (I, II, IV, AND V): NOT DETECTED
Salmonella species: NOT DETECTED
Shiga like toxin producing E coli (STEC): NOT DETECTED
Shigella/Enteroinvasive E coli (EIEC): NOT DETECTED
VIBRIO CHOLERAE: NOT DETECTED
VIBRIO SPECIES: NOT DETECTED
YERSINIA ENTEROCOLITICA: NOT DETECTED

## 2017-12-23 LAB — GLUCOSE, CAPILLARY
GLUCOSE-CAPILLARY: 118 mg/dL — AB (ref 65–99)
Glucose-Capillary: 116 mg/dL — ABNORMAL HIGH (ref 65–99)
Glucose-Capillary: 124 mg/dL — ABNORMAL HIGH (ref 65–99)
Glucose-Capillary: 133 mg/dL — ABNORMAL HIGH (ref 65–99)

## 2017-12-23 LAB — MAGNESIUM: MAGNESIUM: 2.1 mg/dL (ref 1.7–2.4)

## 2017-12-23 LAB — PHOSPHORUS: PHOSPHORUS: 4.8 mg/dL — AB (ref 2.5–4.6)

## 2017-12-23 NOTE — Progress Notes (Signed)
[facility] Progress Note  Subjective: not responding verbally today, follows simpel commands. UOP 750 cc yest, creat down minimally at 4.4.   Vitals:   12/22/17 1216 12/22/17 2126 12/23/17 0624 12/23/17 1429  BP: (!) 112/52 (!) 132/59 138/62 135/63  Pulse: 82 76 88 85  Resp: _0 Temp: 97.8 F (36.6 C) 97.7 F (36.5 C) 97.8 F (36.6 C) 97.6 F (36.4 C)  TempSrc: Oral Oral Oral Oral  SpO2: 98% 100% 98% 94%  Weight:      Height:        Inpatient medications: . amiodarone  200 mg Oral Daily  . guaiFENesin  600 mg Oral Q12H  . heparin  5,000 Units Subcutaneous Q8H  . insulin aspart  0-9 Units Subcutaneous TID WC  . levothyroxine  50 mcg Oral QAC breakfast  . Melatonin  3 mg Oral QHS  . montelukast  10 mg Oral Daily  . sodium chloride flush  10-40 mL Intracatheter Q12H   . sodium chloride 100 mL/hr at 12/23/17 1118  . fluconazole (DIFLUCAN) IV 200 mg (12/22/17 1753)  . piperacillin-tazobactam (ZOSYN)  IV Stopped (12/23/17 0730)   hydrALAZINE, ondansetron (ZOFRAN) IV **OR** ondansetron, sodium chloride flush, traMADol  Exam: Gen alert morbidly obese lying flat unable to turn  No rash, cyanosis or gangrene Sclera anicteric, throat clear  No jvd or bruits Chest clear bilat to bases RRR no MRG  Abd soft ntnd no mass or ascites +bs morbid obesity GU foley in place draining clear urine MS no joint effusions or deformity Ext chrnoic skin changes, skin wrinkling w/o sig pitting edema, no wounds or ulcers Neuro is alert, Ox 3, gen'd weakness     Home meds:  - norvasc 5/ lopressor 25 bid/ bumex 2 mg qd  - amio 200 qd  - synthroid/ singulair/ Kdur/ ultram prn  - vitamins/ supplements/ prn's   date                            Creat                notes  2011                           1.0  2014                           1.2  2016                           1.6- 2.7            aki  2017                           0.9- 1.2   mar/ apr 2018              1.0- 1.20 May 2017                    1.4- 1.8            32- 40 egfr  jun 6- 8, 2019             4.3 - 4.5              renal US 12/20/17 > 10.8/ 11.1 cm kidneys  no hydro, bladder decompressed  UA 6/6  > 50 wbc/ 11-20 rbc/ 0-5 epis, rare bact, protein 30  UNa 17 and UCreat 142  cxr 6.6 - no active disease   Impression: 1 Acute on CKD 3 - baseline creat 1.5- 1.8 prob due to HTN.  AKI most likely hemodynamic due to relative hypotension/ infection vs ischemia/ ATN. Making urine. DC's norvasc/ metoprolol, continue IVF"s. Creat down minimally today. Pt is not a dialysis candidate due to comorbidities.  Continue supportive care.  Have d/w primary team.  2 UTI fungal - on diflucan 3 Diverticulitis - iv zosyn 4 morbid obesity 5 debility - significant and chronic 6 atrial fib on amio 7 hypertension  8 OSA   Plan - as above   Kelly Splinter MD [facility] pager [phone]   12/23/2017, 2:46 PM   Recent Labs  Lab 12/21/17 0415 12/22/17 0420 12/23/17 0402  NA 135 136 134*  K 4.8 4.7 4.3  CL 99* 101 103  CO2 24 24 20*  GLUCOSE 157* 127* 130*  BUN 70* 78* 77*  CREATININE 4.22* 4.20* 4.07*  CALCIUM 8.1* 8.1* 7.9*  PHOS 3.5 4.5 4.8*   Recent Labs  Lab 12/21/17 0415 12/22/17 0420 12/23/17 0402  AST 14* 15 13*  ALT 10* 11* 13*  ALKPHOS 73 92 90  BILITOT 1.0 0.6 0.6  PROT 5.9* 5.9* 5.8*  ALBUMIN 2.4* 2.2* 2.1*   Recent Labs  Lab 12/21/17 0845 12/22/17 0420 12/23/17 0402  WBC 20.1* 21.3* 17.0*  NEUTROABS 14.4 16.0* 13.4*  HGB 9.8* 10.2* 10.0*  HCT 31.8* 33.2* 32.5*  MCV 86.2 86.0 85.8  PLT 294 287 266   Iron/TIBC/Ferritin/ %Sat No results found for: IRON, TIBC, FERRITIN, IRONPCTSAT

## 2017-12-23 NOTE — Progress Notes (Signed)
PROGRESS NOTE    Jodi Underwood  PPJ:[id number] DOB: [birth year] DOA: 12/20/2017 PCP: Jodi Marble, MD   Brief Narrative:   HPI: Jodi Underwood is a 72 y.o. female with medical history significant of morbid obesity, chronic lymphedema, hypertension, hypothyroidism, CKD stage 3 sleep apnea, on oxygen on baseline who was sent from [facility] with complaints of generalized weakness, altered mental status and acute kidney injury.  Patient is a poor historian.  Patient was really not sure why they sent her to the emergency department .But she says she was not really feeling well since last few days.  She reported that she was not having good urinary output and was very thirsty .  Patient was admitted here and was discharged on 06/16/2017 after management for A. fib with RVR.  She was discharged on warfarin but currently she is not on anticoagulation and heart rate is controlled.  Patient found to have a creatinine of 4.35 on presentation with elevated BUN.  She also has mild elevated white cell counts.  She was admitted for acute on chronic kidney disease stage III, urinary tract infection, and sigmoid colitis. Renal Function not improving much with IVF hydration so Nephrology was consulted.   Assessment & Plan:   Principal Problem:   Acute on chronic renal failure (HCC) Active Problems:   Morbid obesity ([place])   Essential hypertension   Asthma   Lymphedema   UTI (urinary tract infection)   Constipation   AKI (acute kidney injury) ([place])   A-fib (HCC)   Chronic systolic CHF (congestive heart failure) ([place])  AKI on CKD stage III -Her last creatinine was 1.77 on 06/16/2017.   -Baseline creatinine ranges from 1.4-2.3.   -Patient looks dry and dehydrated.  -She was on Bumetanide at the skilled nursing facility which has been held. -Continue to hydrate the patient.  Foley to be inserted for Strict I's/O's. -Acute kidney injury could be from volume  depletion versus UTI versus obstructive pathology.   -Checked CT stone study and showed Moderate nonspecific sigmoid colitis, which could be infectious or ischemic in etiology. No evidence of abscess or free fluid. Moderate paraumbilical hernia containing transverse colon and fat. Tiny nonobstructing calculi within the right renal collecting system. No evidence of ureteral calculi or hydronephrosis. -BUN/creatinine went from 66/4.35 and is now 77/4.07 -Obtained a Nephrology consultation as Kidney function not really improving as they recommending D/Cing Norvasc and Metoprolol and recommended giving Boluses -Patient is not a Dialysis Candidate per Nephrology -Checked urine sodium and was 17, urine creatinine was 142; FENa was 0.4% suggesting Pre-Renal Causes -Renal U/S showed Negative for age ultrasound appearance of both kidneys. Contracted urinary bladder with mild to moderate nonspecific wall thickening. -Continue to Monitor and Repeat CMP in AM  -If Not improving significantly may get Palliative Consult as patient is not a Dialysis Candidate  Fungal Urinary Tract Infection -It was reported that patient was been treated with ceftriaxone for urinary tract infection.   -Denies any fever or chills. -U/A showed hemoglobin, large leukocytes, rare bacteria, greater than 50 WBCs and urine culture showed thousand colonies of yeast -Patient was started empirically on IV ceftriaxone will continue for now given her possible sigmoid colitis and we will also start patient on IV fluconazole with pharmacy to dose for the Yeast in her urine -Repeat Urine Culture showed 60,000 CFU of Yeast -C/w IV Diflucan as above  Sleep Apnea / Obesity Hypoventilation syndrome/chronic respiratory insufficiency:  -Recently started on oxygen.  -Currently  her sats are fine with supplemental oxygen.   -Continue CPAP here.  Heart failure with reduced ejection fraction: -Echocardiogram on 11/18 shows ejection fraction of  50%, hypokinesis of anteroseptal, inferior and inferoseptal myocardium.   -BNP was 202.4 -Patient was on Bumetanide at nursing home. -Not on ACE or ARB given Renal Insufficiency    -On Metoprolol Tartrate 25 mg po q12h -Diuresis will be held as patient is hypovolemic. -Patient is +4.254 Liters and Weight is 348 lbs -Continue with IVF Hydration at a rate to 100 mL/hr  Morbid Obesity/Lymphedema:  -Patient is debilitated and bedbound due to her morbid obesity and lymphedema.   -She has not ambulated since November 2018.   -She says she has lost more than 100 pounds in last year. -[place] nurse for Lymphedema -Turn every 2 hours but patient refusing to be turned and refusing Bariatric Bed earlier but agreeable to it now  History of A. Fib -Was discharged on Coumadin as per the records on November 2018.   -Currently not on any anticoagulation.   -EKG showed normal sinus rhythm. -On Amiodarone 200 mg po Daily but Metoprolol 25 mg po q12h Discontinued by Nephrology  Hypothyroidism  -TSH was 17.474 -? Compliance with Synthroid -Checked Free T4 and was 0.84 and T3 was 51 (low) -C/w Levothyroxine 50 mcg po Daily for now and may need to titrate up  Hypertension -Currently blood pressure stable.   -Her home meds of Amlodipine 5 mg po Daily and Metoprolol have been D/C'd by Nephro.  Asthma -Currently stable -Currently not in Exacerbation -C/w Montelukast 10 mg po Daily  -If necessary will add Nebs  Sigmoid Colitis  -Seen on CT Renal Stone Study -Abx of IV Ceftriaxone and IV Metronidazole changed to IV Zosyn -Obtain Blood Cx x2 and showed NGTD <24 Hours -Symptomatic Treatment and Anti-emetics -C/w IVF Rehydration of NS at 100 mL/hr -Continue Clear Liquid Diet for now  -GI pathogen panel by PCR still pending; Having diarrhea and had at least 3 bowel movements  -WBC worsened from 14.7 -> 20.1 -> 21.3 but now improving on Zosyn at 17.0 -Continue to Monitor extremely Closely and repeat  CBC in AM   Hypomagnesemia -Patient magnesium level this morning was 1.5 and improved to 2.1 -Continue to monitor and replete as necessary -Repeat magnesium level in a.m.  Hyperglycemia in the setting of Prediabetes -Patient's hemoglobin A1c was 6.1 -Add sensitive NovoLog sliding scale insulin before meals -Continue to monitor CBGs; CBG's ranging from 116-124  Normocytic Anemia -Patient's Hb/Hct went from 9.8/31.8 -> 10.2/33.2 -> 10.0/32.5 -Continue to Monitor for S/Sx of Infection -Repeat CBC in AM   Hyponatremia -Mild at 134 -C/w IVF Hydration -Repeat CMP in AM  Hyperphosphatemia  -Patient's Phos Level this AM was 4.8 -Continue to Monitor and Repeat Phos Level in AM  DVT prophylaxis: Heparin 5,000 units sq q8h Code Status: FULL CODE Family Communication: No family present at bedside Disposition Plan: Remain Inpatient for current workup and treatment  Consultants:   Nephrology   Procedures: None   Antimicrobials:  Anti-infectives (From admission, onward)   Start     Dose/Rate Route Frequency Ordered Stop   12/22/17 1100  piperacillin-tazobactam (ZOSYN) IVPB 2.25 g     2.25 g 100 mL/hr over 30 Minutes Intravenous Every 8 hours 12/22/17 1003     12/21/17 1800  fluconazole (DIFLUCAN) IVPB 200 mg  Status:  Discontinued     200 mg 100 mL/hr over 60 Minutes Intravenous  Once 12/21/17 1704 12/21/17 1715  12/21/17 1800  fluconazole (DIFLUCAN) IVPB 200 mg     200 mg 100 mL/hr over 60 Minutes Intravenous Every 24 hours 12/21/17 1715     12/20/17 1800  metroNIDAZOLE (FLAGYL) IVPB 500 mg  Status:  Discontinued     500 mg 100 mL/hr over 60 Minutes Intravenous Every 8 hours 12/20/17 1716 12/22/17 0955   12/20/17 1400  cefTRIAXone (ROCEPHIN) 2 g in sodium chloride 0.9 % 100 mL IVPB  Status:  Discontinued     2 g 200 mL/hr over 30 Minutes Intravenous Every 24 hours 12/20/17 1355 12/22/17 0955     Subjective: Seen and examined at bedside and was feeling better. Wanting  her legs propped up. Still complaining of loose stools and abdominal pain. No CP or SOB. Feels as if lymphedema.   Objective: Vitals:   12/22/17 1216 12/22/17 2126 12/23/17 0624 12/23/17 1429  BP: (!) 112/52 (!) 132/59 138/62 135/63  Pulse: 82 76 88 85  Resp: 18 18 20 19   Temp: 97.8 F (36.6 C) 97.7 F (36.5 C) 97.8 F (36.6 C) 97.6 F (36.4 C)  TempSrc: Oral Oral Oral Oral  SpO2: 98% 100% 98% 94%  Weight:      Height:        Intake/Output Summary (Last 24 hours) at 12/23/2017 1908 Last data filed at 12/23/2017 1214 Gross per 24 hour  Intake -  Output 600 ml  Net -600 ml   Filed Weights   12/20/17 1600  Weight: (!) 157.9 kg (348 lb 1.7 oz)   Examination: Physical Exam:  Constitutional: WN/WD morbidly obese female appears uncomfortable but not in any acute distress Eyes: Sclerae anicteric. Lids and conjunctivae normal ENMT: External ears and nose appear normal. Grossly normal hearing Neck: Supple with no appreciable JVD but difficult to distinguish due to body habitus and obesity Respiratory: Diminished to auscultation but no appreciable wheezing/rales/rhonchi Cardiovascular: RRR; No appreciable m/r/g.  Abdomen: Soft, Tender to palpate in the lower abdomen. Distended due to body habitus. Bowel sounds present and slightly hyperactive GU: Deferred Musculoskeletal: No contractures; No cyanosis. No joint deformities noted Skin: Warm and dry. Has chronic lymphedema changes in the LE and swelling Neurologic: CN 2-12 grossly intact. No appreciable focal defcitis Psychiatric: Normal judgement and insight. Awake and alert. Depressed appearing mood and affect  Data Reviewed: I have personally reviewed following labs and imaging studies  CBC: Recent Labs  Lab 12/20/17 1140 12/21/17 0845 12/22/17 0420 12/23/17 0402  WBC 14.7* 20.1* 21.3* 17.0*  NEUTROABS 8.9* 14.4 16.0* 13.4*  HGB 10.9* 9.8* 10.2* 10.0*  HCT 36.0 31.8* 33.2* 32.5*  MCV 87.0 86.2 86.0 85.8  PLT 300 294 287  564   Basic Metabolic Panel: Recent Labs  Lab 12/20/17 1140 12/21/17 0415 12/22/17 0420 12/23/17 0402  NA 135 135 136 134*  K 5.0 4.8 4.7 4.3  CL 96* 99* 101 103  CO2 26 24 24  20*  GLUCOSE 152* 157* 127* 130*  BUN 66* 70* 78* 77*  CREATININE 4.35* 4.22* 4.20* 4.07*  CALCIUM 8.3* 8.1* 8.1* 7.9*  MG  --  1.5* 2.1 2.1  PHOS  --  3.5 4.5 4.8*   GFR: Estimated Creatinine Clearance: 19.5 mL/min (A) (by C-G formula based on SCr of 4.07 mg/dL (H)). Liver Function Tests: Recent Labs  Lab 12/20/17 1140 12/21/17 0415 12/22/17 0420 12/23/17 0402  AST 16 14* 15 13*  ALT 11* 10* 11* 13*  ALKPHOS 79 73 92 90  BILITOT 0.5 1.0 0.6 0.6  PROT 6.8 5.9* 5.9*  5.8*  ALBUMIN 2.7* 2.4* 2.2* 2.1*   No results for input(s): LIPASE, AMYLASE in the last 168 hours. Recent Labs  Lab 12/20/17 1140  AMMONIA 16   Coagulation Profile: No results for input(s): INR, PROTIME in the last 168 hours. Cardiac Enzymes: No results for input(s): CKTOTAL, CKMB, CKMBINDEX, TROPONINI in the last 168 hours. BNP (last 3 results) No results for input(s): PROBNP in the last 8760 hours. HbA1C: Recent Labs    12/21/17 0415  HGBA1C 6.1*   CBG: Recent Labs  Lab 12/22/17 1724 12/22/17 2131 12/23/17 0802 12/23/17 1153 12/23/17 1616  GLUCAP 142* 133* 118* 116* 124*   Lipid Profile: No results for input(s): CHOL, HDL, LDLCALC, TRIG, CHOLHDL, LDLDIRECT in the last 72 hours. Thyroid Function Tests: Recent Labs    12/21/17 1641  FREET4 0.84   Anemia Panel: No results for input(s): VITAMINB12, FOLATE, FERRITIN, TIBC, IRON, RETICCTPCT in the last 72 hours. Sepsis Labs: Recent Labs  Lab 12/20/17 1147  LATICACIDVEN 1.53    Recent Results (from the past 240 hour(s))  Culture, Urine     Status: Abnormal   Collection Time: 12/20/17  2:19 PM  Result Value Ref Range Status   Specimen Description   Final    URINE, RANDOM Performed at [facility], [place] [address], [place], [place]  [address]    Special Requests   Final    NONE Performed at [facility], [place] [address], [place], [place] [address]    Culture 40,000 COLONIES/mL YEAST (A)  Final   Report Status 12/21/2017 FINAL  Final  Culture, blood (routine x 2)     Status: None (Preliminary result)   Collection Time: 12/21/17  4:41 PM  Result Value Ref Range Status   Specimen Description   Final    BLOOD LEFT HAND Performed at [facility] [address] [place], [place] [address]    Special Requests   Final    BOTTLES DRAWN AEROBIC ONLY Blood Culture adequate volume Performed at [facility] [address], [place], [place] [address]    Culture   Final    NO GROWTH 2 DAYS Performed at [facility] [address] [place], [place] [address]    Report Status PENDING  Incomplete  Culture, blood (routine x 2)     Status: None (Preliminary result)   Collection Time: 12/21/17  4:43 PM  Result Value Ref Range Status   Specimen Description   Final    BLOOD RIGHT ANTECUBITAL Performed at [facility] [address], [place], [place] [address]    Special Requests   Final    BOTTLES DRAWN AEROBIC AND ANAEROBIC Blood Culture adequate volume Performed at [facility] [address] [place], [place] [address]    Culture   Final    NO GROWTH 2 DAYS Performed at [facility] [address], [place], [place] [address]    Report Status PENDING  Incomplete  Culture, Urine     Status: Abnormal   Collection Time: 12/21/17  5:43 PM  Result Value Ref Range Status   Specimen Description   Final    URINE, RANDOM Performed at [facility] [address] [place], [place] [address]    Special Requests   Final    NONE Performed at [facility], [place] [address] [place], [place] [address]    Culture 60,000 COLONIES/mL YEAST (A)  Final  Report Status 12/22/2017 FINAL  Final    Gastrointestinal Panel by PCR , Stool     Status: None   Collection Time: 12/22/17  3:59 AM  Result Value Ref Range Status   Campylobacter species NOT DETECTED NOT DETECTED Final   Plesimonas shigelloides NOT DETECTED NOT DETECTED Final   Salmonella species NOT DETECTED NOT DETECTED Final   Yersinia enterocolitica NOT DETECTED NOT DETECTED Final   Vibrio species NOT DETECTED NOT DETECTED Final   Vibrio cholerae NOT DETECTED NOT DETECTED Final   Enteroaggregative E coli (EAEC) NOT DETECTED NOT DETECTED Final   Enteropathogenic E coli (EPEC) NOT DETECTED NOT DETECTED Final   Enterotoxigenic E coli (ETEC) NOT DETECTED NOT DETECTED Final   Shiga like toxin producing E coli (STEC) NOT DETECTED NOT DETECTED Final   Shigella/Enteroinvasive E coli (EIEC) NOT DETECTED NOT DETECTED Final   Cryptosporidium NOT DETECTED NOT DETECTED Final   Cyclospora cayetanensis NOT DETECTED NOT DETECTED Final   Entamoeba histolytica NOT DETECTED NOT DETECTED Final   Giardia lamblia NOT DETECTED NOT DETECTED Final   Adenovirus F40/41 NOT DETECTED NOT DETECTED Final   Astrovirus NOT DETECTED NOT DETECTED Final   Norovirus GI/GII NOT DETECTED NOT DETECTED Final   Rotavirus A NOT DETECTED NOT DETECTED Final   Sapovirus (I, II, IV, and V) NOT DETECTED NOT DETECTED Final    Comment: Performed at [facility], [address], [place], [place] [address]     Radiology Studies: No results found. Scheduled Meds: . amiodarone  200 mg Oral Daily  . guaiFENesin  600 mg Oral Q12H  . heparin  5,000 Units Subcutaneous Q8H  . insulin aspart  0-9 Units Subcutaneous TID WC  . levothyroxine  50 mcg Oral QAC breakfast  . Melatonin  3 mg Oral QHS  . montelukast  10 mg Oral Daily  . sodium chloride flush  10-40 mL Intracatheter Q12H   Continuous Infusions: . sodium chloride 100 mL/hr at 12/23/17 1118  . fluconazole (DIFLUCAN) IV 200 mg (12/23/17 1807)  . piperacillin-tazobactam (ZOSYN)  IV Stopped (12/23/17 1535)     LOS: 3 days   Kerney Elbe, DO [facility] Pager [phone]  If 7PM-7AM, please contact night-coverage [email or web address] Password [email or web address] 12/23/2017, 7:08 PM

## 2017-12-24 ENCOUNTER — Inpatient Hospital Stay (HOSPITAL_COMMUNITY): Payer: Medicare Other

## 2017-12-24 DIAGNOSIS — R410 Disorientation, unspecified: Secondary | ICD-10-CM

## 2017-12-24 DIAGNOSIS — R400 Somnolence: Secondary | ICD-10-CM

## 2017-12-24 LAB — COMPREHENSIVE METABOLIC PANEL
ALBUMIN: 2.2 g/dL — AB (ref 3.5–5.0)
ALT: 11 U/L — ABNORMAL LOW (ref 14–54)
AST: 15 U/L (ref 15–41)
Alkaline Phosphatase: 134 U/L — ABNORMAL HIGH (ref 38–126)
Anion gap: 12 (ref 5–15)
BILIRUBIN TOTAL: 0.8 mg/dL (ref 0.3–1.2)
BUN: 79 mg/dL — AB (ref 6–20)
CO2: 20 mmol/L — ABNORMAL LOW (ref 22–32)
Calcium: 8 mg/dL — ABNORMAL LOW (ref 8.9–10.3)
Chloride: 105 mmol/L (ref 101–111)
Creatinine, Ser: 3.76 mg/dL — ABNORMAL HIGH (ref 0.44–1.00)
GFR calc Af Amer: 13 mL/min — ABNORMAL LOW (ref >60)
GFR calc non Af Amer: 11 mL/min — ABNORMAL LOW (ref >60)
GLUCOSE: 138 mg/dL — AB (ref 65–99)
POTASSIUM: 4 mmol/L (ref 3.5–5.1)
Sodium: 137 mmol/L (ref 135–145)
TOTAL PROTEIN: 5.8 g/dL — AB (ref 6.5–8.1)

## 2017-12-24 LAB — GLUCOSE, CAPILLARY
GLUCOSE-CAPILLARY: 115 mg/dL — AB (ref 65–99)
Glucose-Capillary: 126 mg/dL — ABNORMAL HIGH (ref 65–99)
Glucose-Capillary: 110 mg/dL — ABNORMAL HIGH (ref 65–99)
Glucose-Capillary: 119 mg/dL — ABNORMAL HIGH (ref 65–99)

## 2017-12-24 LAB — BLOOD GAS, ARTERIAL
Acid-base deficit: 9.1 mmol/L — ABNORMAL HIGH (ref 0.0–2.0)
BICARBONATE: 17.8 mmol/L — AB (ref 20.0–28.0)
Drawn by: 257881
FIO2: 0.21
O2 Saturation: 89 %
PATIENT TEMPERATURE: 37
PCO2 ART: 45.5 mmHg (ref 32.0–48.0)
PO2 ART: 60.4 mmHg — AB (ref 83.0–108.0)
pH, Arterial: 7.217 — ABNORMAL LOW (ref 7.350–7.450)

## 2017-12-24 LAB — CBC WITH DIFFERENTIAL/PLATELET
BASOS ABS: 0 10*3/uL (ref 0.0–0.1)
Basophils Relative: 0 %
Eosinophils Absolute: 0.2 10*3/uL (ref 0.0–0.7)
Eosinophils Relative: 1 %
HEMATOCRIT: 31 % — AB (ref 36.0–46.0)
HEMOGLOBIN: 9.6 g/dL — AB (ref 12.0–15.0)
LYMPHS ABS: 1.4 10*3/uL (ref 0.7–4.0)
LYMPHS PCT: 9 %
MCH: 26.6 pg (ref 26.0–34.0)
MCHC: 31 g/dL (ref 30.0–36.0)
MCV: 85.9 fL (ref 78.0–100.0)
MONOS PCT: 11 %
Monocytes Absolute: 1.8 10*3/uL — ABNORMAL HIGH (ref 0.1–1.0)
NEUTROS PCT: 79 %
Neutro Abs: 12.7 10*3/uL — ABNORMAL HIGH (ref 1.7–7.7)
Platelets: 286 10*3/uL (ref 150–400)
RBC: 3.61 MIL/uL — AB (ref 3.87–5.11)
RDW: 15.4 % (ref 11.5–15.5)
WBC: 16.1 10*3/uL — AB (ref 4.0–10.5)

## 2017-12-24 LAB — MAGNESIUM: Magnesium: 2.1 mg/dL (ref 1.7–2.4)

## 2017-12-24 LAB — PHOSPHORUS: Phosphorus: 4.5 mg/dL (ref 2.5–4.6)

## 2017-12-24 NOTE — Progress Notes (Signed)
[facility] Progress Note  Subjective: more responsive and interactive  Vitals:   12/23/17 2119 12/24/17 0626 12/24/17 1409 12/24/17 1409  BP: (!) 147/78 (!) 127/55  (!) 149/56  Pulse: 88 82  80  Resp: 20 18  (!) 22  Temp: 98.3 F (36.8 C) 98.6 F (37 C) (!) 97.5 F (36.4 C)   TempSrc: Oral Axillary Oral   SpO2: 95% 100%  100%  Weight:      Height:        Inpatient medications: . amiodarone  200 mg Oral Daily  . guaiFENesin  600 mg Oral Q12H  . heparin  5,000 Units Subcutaneous Q8H  . insulin aspart  0-9 Units Subcutaneous TID WC  . levothyroxine  50 mcg Oral QAC breakfast  . Melatonin  3 mg Oral QHS  . montelukast  10 mg Oral Daily  . sodium chloride flush  10-40 mL Intracatheter Q12H   . sodium chloride 100 mL/hr at 12/24/17 0922  . fluconazole (DIFLUCAN) IV Stopped (12/23/17 1907)  . piperacillin-tazobactam (ZOSYN)  IV Stopped (12/24/17 1433)   hydrALAZINE, ondansetron (ZOFRAN) IV **OR** ondansetron, sodium chloride flush, traMADol  Exam: Gen alert morbidly obese lying flat unable to turn  No rash, cyanosis or gangrene Sclera anicteric, throat clear  No jvd or bruits Chest clear bilat to bases RRR no MRG  Abd soft ntnd no mass or ascites +bs morbid obesity GU foley in place draining clear urine MS no joint effusions or deformity Ext chrnoic skin changes, skin wrinkling w/o sig pitting edema Neuro is alert, Ox 3, gen'd weakness     Home meds:  - norvasc 5/ lopressor 25 bid/ bumex 2 mg qd  - amio 200 qd  - synthroid/ singulair/ Kdur/ ultram prn  - vitamins/ supplements/ prn's   date                            Creat                notes  2011                           1.0  2014                           1.2  2016                           1.6- 2.7            aki  2017                           0.9- 1.2   mar/ apr 2018             1.0- 1.20 May 2017                    1.4- 1.8            32- 40 egfr  jun 6- 8, 2019             4.3 - 4.5               renal US 12/20/17 > 10.8/ 11.1 cm kidneys no hydro, bladder decompressed  UA 6/6  > 50 wbc/ 11-20 rbc/ 0-5 epis, rare bact, protein  30  UNa 17 and UCreat 142  cxr 6.6 - no active disease   Impression: 1 Acute on CKD 3 - baseline creat 1.5- 1.8 prob due to HTN.  AKI most likely hemodynamic due to relative hypotension/ dehydration. DC'd norvasc/ metoprolol and getting IVF's.. Creat coming down now to 3.7 today.  Not a dialysis candidate due to comorbidities.  Continue supportive care.  Have d/w primary team. Will sign off.  2 UTI fungal - on diflucan 3 Diverticulitis - iv zosyn 4 morbid obesity 5 debility - significant and chronic 6 atrial fib on amio 7 hypertension  8 OSA   Plan - as above   Kelly Splinter MD [facility] pager [phone]   12/24/2017, 3:06 PM   Recent Labs  Lab 12/22/17 0420 12/23/17 0402 12/24/17 0427  NA 136 134* 137  K 4.7 4.3 4.0  CL 101 103 105  CO2 24 20* 20*  GLUCOSE 127* 130* 138*  BUN 78* 77* 79*  CREATININE 4.20* 4.07* 3.76*  CALCIUM 8.1* 7.9* 8.0*  PHOS 4.5 4.8* 4.5   Recent Labs  Lab 12/22/17 0420 12/23/17 0402 12/24/17 0427  AST 15 13* 15  ALT 11* 13* 11*  ALKPHOS 92 90 134*  BILITOT 0.6 0.6 0.8  PROT 5.9* 5.8* 5.8*  ALBUMIN 2.2* 2.1* 2.2*   Recent Labs  Lab 12/22/17 0420 12/23/17 0402 12/24/17 0427  WBC 21.3* 17.0* 16.1*  NEUTROABS 16.0* 13.4* 12.7*  HGB 10.2* 10.0* 9.6*  HCT 33.2* 32.5* 31.0*  MCV 86.0 85.8 85.9  PLT 287 266 286   Iron/TIBC/Ferritin/ %Sat No results found for: IRON, TIBC, FERRITIN, IRONPCTSAT

## 2017-12-24 NOTE — Progress Notes (Signed)
Patient has wanted to sleep all day. She is arousable when spoken to, but constantly states she wants to sleep. States she "sleeps a lot all of the time." Patient not wanting to cooperate with nursing care today. Unwilling to allow RN to teach use of incentive spirometer. Patient has not had any liquids from her meal trays. Patient states she is hungry at times but when offered liquids from her tray she states again "I just want to sleep." She has drank about 300cc of water. Patient has kept CPAP on all day due to her sleeping.    Othella [facility] 12/24/2017 4:56 PM

## 2017-12-24 NOTE — Care Management Important Message (Signed)
Important Message  Patient Details  Name: Jodi Underwood MRN: [id number] Date of Birth: [birth year]   Medicare Important Message Given:  Yes    Kerin Salen 12/24/2017, 1:34 PMImportant Message  Patient Details  Name: Jodi Underwood MRN: [id number] Date of Birth: Sep 07, 1945   Medicare Important Message Given:  Yes    Kerin Salen 12/24/2017, 1:34 PM

## 2017-12-24 NOTE — Progress Notes (Signed)
PROGRESS NOTE    Jodi Underwood  ZOX:[id number] DOB: 1945-12-31 DOA: 12/20/2017 PCP: Jodi Marble, MD   Brief Narrative:   HPI: Jodi Underwood is a 72 y.o. female with medical history significant of morbid obesity, chronic lymphedema, hypertension, hypothyroidism, CKD stage 3 sleep apnea, on oxygen on baseline who was sent from [facility] with complaints of generalized weakness, altered mental status and acute kidney injury.  Patient is a poor historian.  Patient was really not sure why they sent her to the emergency department .But she says she was not really feeling well since last few days.  She reported that she was not having good urinary output and was very thirsty .  Patient was admitted here and was discharged on 06/16/2017 after management for A. fib with RVR.  She was discharged on warfarin but currently she is not on anticoagulation and heart rate is controlled.  Patient found to have a creatinine of 4.35 on presentation with elevated BUN.  She also has mild elevated white cell counts.  She was admitted for acute on chronic kidney disease stage III, urinary tract infection, and sigmoid colitis. Renal Function was not improving much with IVF hydration so Nephrology was consulted and they have stopped BP medications and given fluid boluses and Cr is slowly trending down now.  Assessment & Plan:   Principal Problem:   Acute on chronic renal failure (HCC) Active Problems:   Morbid obesity ([place])   Essential hypertension   Asthma   Lymphedema   UTI (urinary tract infection)   Constipation   AKI (acute kidney injury) ([place])   A-fib (HCC)   Chronic systolic CHF (congestive heart failure) ([place])  AKI on CKD stage III, slowly improving  -Her last creatinine was 1.77 on 06/16/2017.   -Baseline creatinine ranges from 1.4-2.3.   -Patient looks dry and dehydrated.  -She was on Bumetanide at the skilled nursing facility which has been  held. -Continue to hydrate the patient.  Foley to be inserted for Strict I's/O's. -Acute kidney injury could be from volume depletion versus UTI versus obstructive pathology.   -Checked CT stone study and showed Moderate nonspecific sigmoid colitis, which could be infectious or ischemic in etiology. No evidence of abscess or free fluid. Moderate paraumbilical hernia containing transverse colon and fat. Tiny nonobstructing calculi within the right renal collecting system. No evidence of ureteral calculi or hydronephrosis. -BUN/creatinine went from 66/4.35 and is now 79/3.76 -Obtained a Nephrology consultation as Kidney function not really improving as they recommending D/Cing Norvasc and Metoprolol and recommended giving Boluses -Patient is not a Dialysis Candidate per Nephrology -Checked urine sodium and was 17, urine creatinine was 142; FENa was 0.4% suggesting Pre-Renal Causes -Renal U/S showed Negative for age ultrasound appearance of both kidneys. Contracted urinary bladder with mild to moderate nonspecific wall thickening. -IVF rate decreased to 50 mL -Continue to Monitor and Repeat CMP in AM  -If Not improving significantly will consider Palliative Consult as patient is not a Dialysis Candidate  Fungal Urinary Tract Infection -It was reported that patient was been treated with ceftriaxone for urinary tract infection.   -Denies any fever or chills. -U/A showed hemoglobin, large leukocytes, rare bacteria, greater than 50 WBCs and urine culture showed thousand colonies of yeast -Patient was started empirically on IV ceftriaxone will continue for now given her possible sigmoid colitis and we will also start patient on IV fluconazole with pharmacy to dose for the Yeast in her urine -Repeat Urine Culture  showed 60,000 CFU of Yeast -C/w IV Diflucan as above  Sleep Apnea / Obesity Hypoventilation syndrome/chronic respiratory insufficiency:  -Recently started on oxygen.  -Currently her sats  are fine with supplemental oxygen.   -Continue CPAP here.  Heart failure with reduced ejection fraction: -Echocardiogram on 11/18 shows ejection fraction of 50%, hypokinesis of anteroseptal, inferior and inferoseptal myocardium.   -BNP was 202.4 -Patient was on Bumetanide at nursing home. -Not on ACE or ARB given Renal Insufficiency    -On Metoprolol Tartrate 25 mg po q12h -Diuresis will be held as patient is hypovolemic. -Patient is +9.699 Liters and Weight is 348 lbs -Continue with IVF Hydration at a rate of 50 mL/hr  Morbid Obesity/Lymphedema:  -Patient is debilitated and bedbound due to her morbid obesity and lymphedema.   -She has not ambulated since November 2018.   -She says she has lost more than 100 pounds in last year. -[place] nurse for Lymphedema -Turn every 2 hours but patient refusing to be turned and refusing Bariatric Bed earlier but agreeable to it now  History of A. Fib -Was discharged on Coumadin as per the records on November 2018.   -Currently not on any anticoagulation.   -EKG showed normal sinus rhythm. -On Amiodarone 200 mg po Daily but Metoprolol 25 mg po q12h Discontinued by Nephrology  Hypothyroidism  -TSH was 17.474 -? Compliance with Synthroid -Checked Free T4 and was 0.84 and T3 was 51 (low) -C/w Levothyroxine 50 mcg po Daily for now and may need to titrate up  Hypertension -Currently blood pressure stable.   -Her home meds of Amlodipine 5 mg po Daily and Metoprolol have been D/C'd by Nephro.  Asthma -Currently stable -Currently not in Exacerbation -C/w Montelukast 10 mg po Daily  -If necessary will add Nebs  Sigmoid Colitis  -Seen on CT Renal Stone Study -Abx of IV Ceftriaxone and IV Metronidazole changed to IV Zosyn -Obtain Blood Cx x2 and showed NGTD at 3 Days -Symptomatic Treatment and Anti-emetics -C/w IVF Rehydration of NS at 50 mL/hr -Continue Clear Liquid Diet for now and Advance to FULL Liquid   -GI pathogen panel by PCR still  pending; Having diarrhea and had at least 3 bowel movements  -WBC worsened from 14.7 -> 20.1 -> 21.3 but now improving on Zosyn and trending down to 16.1 -Continue to Monitor extremely Closely and repeat CBC in AM   Hypomagnesemia -Patient magnesium level this morning was 1.5 and improved to 2.1 -Continue to monitor and replete as necessary -Repeat magnesium level in a.m.  Hyperglycemia in the setting of Prediabetes -Patient's hemoglobin A1c was 6.1 -Add sensitive NovoLog sliding scale insulin before meals -Continue to monitor CBGs; CBG's ranging from 110-126  Normocytic Anemia -Patient's Hb/Hct went from 9.8/31.8 -> 10.2/33.2 -> 10.0/32.5 -> 9.6/31.0 -Continue to Monitor for S/Sx of Infection -Repeat CBC in AM   Hyponatremia -Mild at 134 and now improved to 137 -C/w IVF Hydration -Repeat CMP in AM  Hyperphosphatemia,improved   -Patient's Phos Level was 4.8 and now 4.5 -Continue to Monitor and Repeat Phos Level in AM  Somnolence/Confusion/Encephalopathy -Patient thought she was in a car going for a ride -Checked ABG and showed 7.217/45.5/60.4/17.8/89% on Room Air -Received Melatonin Last night -Checked Head CT and was Negative   DVT prophylaxis: Heparin 5,000 units sq q8h Code Status: FULL CODE Family Communication: No family present at bedside Disposition Plan: Remain Inpatient for current workup and treatment  Consultants:   Nephrology   Procedures: None   Antimicrobials:  Anti-infectives (From  admission, onward)   Start     Dose/Rate Route Frequency Ordered Stop   12/22/17 1100  piperacillin-tazobactam (ZOSYN) IVPB 2.25 g     2.25 g 100 mL/hr over 30 Minutes Intravenous Every 8 hours 12/22/17 1003     12/21/17 1800  fluconazole (DIFLUCAN) IVPB 200 mg  Status:  Discontinued     200 mg 100 mL/hr over 60 Minutes Intravenous  Once 12/21/17 1704 12/21/17 1715   12/21/17 1800  fluconazole (DIFLUCAN) IVPB 200 mg     200 mg 100 mL/hr over 60 Minutes Intravenous  Every 24 hours 12/21/17 1715     12/20/17 1800  metroNIDAZOLE (FLAGYL) IVPB 500 mg  Status:  Discontinued     500 mg 100 mL/hr over 60 Minutes Intravenous Every 8 hours 12/20/17 1716 12/22/17 0955   12/20/17 1400  cefTRIAXone (ROCEPHIN) 2 g in sodium chloride 0.9 % 100 mL IVPB  Status:  Discontinued     2 g 200 mL/hr over 30 Minutes Intravenous Every 24 hours 12/20/17 1355 12/22/17 0955     Subjective: Seen and examined and was confused this AM and wanting to sleep. She was somnolent but easily arousable. Did not complain of abdomen hurting but when palpating abdomen stated it hurt. No CP or SOB.   Objective: Vitals:   12/23/17 0624 12/23/17 1429 12/23/17 2119 12/24/17 0626  BP: 138/62 135/63 (!) 147/78 (!) 127/55  Pulse: 88 85 88 82  Resp: 20 19 20 18   Temp: 97.8 F (36.6 C) 97.6 F (36.4 C) 98.3 F (36.8 C) 98.6 F (37 C)  TempSrc: Oral Oral Oral Axillary  SpO2: 98% 94% 95% 100%  Weight:      Height:        Intake/Output Summary (Last 24 hours) at 12/24/2017 7619 Last data filed at 12/24/2017 0700 Gross per 24 hour  Intake 3850 ml  Output 1200 ml  Net 2650 ml   Filed Weights   12/20/17 1600  Weight: (!) 157.9 kg (348 lb 1.7 oz)   Examination: Physical Exam:  Constitutional: WN/WD morbidly obese Female who is slightly more confused today and sleepy; She does not interact much and wants to sleep Eyes: Sclerae anicteric; Lids normal ENMT: External Ears and nose appear normal. Grossly normal hearing Neck: Supple with no JVD Respiratory: Diminished to auscultation bilaterally. Slightly tachypenic. Unlabored breathing and no appreciable wheezing/rales/rhonchi Cardiovascular: RRR; No appreciable m/r/g. Chronic lymphedema Abdomen: Soft, Tender to palpate. Distended due to body habitus. Bowel sounds present and hyperactive GU: Deferred Musculoskeletal: No contractures; No cyanosis Skin: Has chronic lymphedema changes in the LE Neurologic: CN 2-12 grossly intact. No  appreciable focal deficits.  Psychiatric: Somnolent but arousable. Confused slightly. Not very engaged today  Data Reviewed: I have personally reviewed following labs and imaging studies  CBC: Recent Labs  Lab 12/20/17 1140 12/21/17 0845 12/22/17 0420 12/23/17 0402 12/24/17 0427  WBC 14.7* 20.1* 21.3* 17.0* 16.1*  NEUTROABS 8.9* 14.4 16.0* 13.4* 12.7*  HGB 10.9* 9.8* 10.2* 10.0* 9.6*  HCT 36.0 31.8* 33.2* 32.5* 31.0*  MCV 87.0 86.2 86.0 85.8 85.9  PLT 300 294 287 266 509   Basic Metabolic Panel: Recent Labs  Lab 12/20/17 1140 12/21/17 0415 12/22/17 0420 12/23/17 0402 12/24/17 0427  NA 135 135 136 134* 137  K 5.0 4.8 4.7 4.3 4.0  CL 96* 99* 101 103 105  CO2 26 24 24  20* 20*  GLUCOSE 152* 157* 127* 130* 138*  BUN 66* 70* 78* 77* 79*  CREATININE 4.35* 4.22* 4.20* 4.07*  3.76*  CALCIUM 8.3* 8.1* 8.1* 7.9* 8.0*  MG  --  1.5* 2.1 2.1 2.1  PHOS  --  3.5 4.5 4.8* 4.5   GFR: Estimated Creatinine Clearance: 21.1 mL/min (A) (by C-G formula based on SCr of 3.76 mg/dL (H)). Liver Function Tests: Recent Labs  Lab 12/20/17 1140 12/21/17 0415 12/22/17 0420 12/23/17 0402 12/24/17 0427  AST 16 14* 15 13* 15  ALT 11* 10* 11* 13* 11*  ALKPHOS 79 73 92 90 134*  BILITOT 0.5 1.0 0.6 0.6 0.8  PROT 6.8 5.9* 5.9* 5.8* 5.8*  ALBUMIN 2.7* 2.4* 2.2* 2.1* 2.2*   No results for input(s): LIPASE, AMYLASE in the last 168 hours. Recent Labs  Lab 12/20/17 1140  AMMONIA 16   Coagulation Profile: No results for input(s): INR, PROTIME in the last 168 hours. Cardiac Enzymes: No results for input(s): CKTOTAL, CKMB, CKMBINDEX, TROPONINI in the last 168 hours. BNP (last 3 results) No results for input(s): PROBNP in the last 8760 hours. HbA1C: No results for input(s): HGBA1C in the last 72 hours. CBG: Recent Labs  Lab 12/23/17 0802 12/23/17 1153 12/23/17 1616 12/23/17 2114 12/24/17 0754  GLUCAP 118* 116* 124* 133* 126*   Lipid Profile: No results for input(s): CHOL, HDL, LDLCALC,  TRIG, CHOLHDL, LDLDIRECT in the last 72 hours. Thyroid Function Tests: Recent Labs    12/21/17 1641  FREET4 0.84   Anemia Panel: No results for input(s): VITAMINB12, FOLATE, FERRITIN, TIBC, IRON, RETICCTPCT in the last 72 hours. Sepsis Labs: Recent Labs  Lab 12/20/17 1147  LATICACIDVEN 1.53    Recent Results (from the past 240 hour(s))  Culture, Urine     Status: Abnormal   Collection Time: 12/20/17  2:19 PM  Result Value Ref Range Status   Specimen Description   Final    URINE, RANDOM Performed at [facility], [place] [address] [place], [place] [address]    Special Requests   Final    NONE Performed at [facility], [place] [address], [place], [place] [address]    Culture 40,000 COLONIES/mL YEAST (A)  Final   Report Status 12/21/2017 FINAL  Final  Culture, blood (routine x 2)     Status: None (Preliminary result)   Collection Time: 12/21/17  4:41 PM  Result Value Ref Range Status   Specimen Description   Final    BLOOD LEFT HAND Performed at [facility] [address], [place], [place] [address]    Special Requests   Final    BOTTLES DRAWN AEROBIC ONLY Blood Culture adequate volume Performed at [facility] [address], [place], [place] [address]    Culture   Final    NO GROWTH 2 DAYS Performed at [facility] [address] [place], [place] [address]    Report Status PENDING  Incomplete  Culture, blood (routine x 2)     Status: None (Preliminary result)   Collection Time: 12/21/17  4:43 PM  Result Value Ref Range Status   Specimen Description   Final    BLOOD RIGHT ANTECUBITAL Performed at [facility] [address] [place], [place] [address]    Special Requests   Final    BOTTLES DRAWN AEROBIC AND ANAEROBIC Blood Culture adequate volume Performed at [facility] [address], [place], [place] [address]    Culture   Final    NO  GROWTH 2 DAYS Performed at [facility] [address],  [place], [place] [address]    Report Status PENDING  Incomplete  Culture, Urine     Status: Abnormal   Collection Time: 12/21/17  5:43 PM  Result Value Ref Range Status   Specimen Description   Final    URINE, RANDOM Performed at [facility] [address], [place], [place] [address]    Special Requests   Final    NONE Performed at [facility], [place] [address], [place], [place] [address]    Culture 60,000 COLONIES/mL YEAST (A)  Final   Report Status 12/22/2017 FINAL  Final  Gastrointestinal Panel by PCR , Stool     Status: None   Collection Time: 12/22/17  3:59 AM  Result Value Ref Range Status   Campylobacter species NOT DETECTED NOT DETECTED Final   Plesimonas shigelloides NOT DETECTED NOT DETECTED Final   Salmonella species NOT DETECTED NOT DETECTED Final   Yersinia enterocolitica NOT DETECTED NOT DETECTED Final   Vibrio species NOT DETECTED NOT DETECTED Final   Vibrio cholerae NOT DETECTED NOT DETECTED Final   Enteroaggregative E coli (EAEC) NOT DETECTED NOT DETECTED Final   Enteropathogenic E coli (EPEC) NOT DETECTED NOT DETECTED Final   Enterotoxigenic E coli (ETEC) NOT DETECTED NOT DETECTED Final   Shiga like toxin producing E coli (STEC) NOT DETECTED NOT DETECTED Final   Shigella/Enteroinvasive E coli (EIEC) NOT DETECTED NOT DETECTED Final   Cryptosporidium NOT DETECTED NOT DETECTED Final   Cyclospora cayetanensis NOT DETECTED NOT DETECTED Final   Entamoeba histolytica NOT DETECTED NOT DETECTED Final   Giardia lamblia NOT DETECTED NOT DETECTED Final   Adenovirus F40/41 NOT DETECTED NOT DETECTED Final   Astrovirus NOT DETECTED NOT DETECTED Final   Norovirus GI/GII NOT DETECTED NOT DETECTED Final   Rotavirus A NOT DETECTED NOT DETECTED Final   Sapovirus (I, II, IV, and V) NOT DETECTED NOT DETECTED Final    Comment: Performed at [facility], [address] [place], [place] [address]     Radiology Studies: No results found. Scheduled Meds: . amiodarone  200 mg Oral Daily  . guaiFENesin  600 mg Oral Q12H  . heparin  5,000 Units Subcutaneous Q8H  . insulin aspart  0-9 Units Subcutaneous TID WC  . levothyroxine  50 mcg Oral QAC breakfast  . Melatonin  3 mg Oral QHS  . montelukast  10 mg Oral Daily  . sodium chloride flush  10-40 mL Intracatheter Q12H   Continuous Infusions: . sodium chloride 100 mL/hr at 12/23/17 2257  . fluconazole (DIFLUCAN) IV Stopped (12/23/17 1907)  . piperacillin-tazobactam (ZOSYN)  IV 2.25 g (12/24/17 3151)    LOS: 4 days   Kerney Elbe, DO [facility] Pager [phone]  If 7PM-7AM, please contact night-coverage [email or web address] Password [email or web address] 12/24/2017, 8:12 AM

## 2017-12-25 ENCOUNTER — Inpatient Hospital Stay (HOSPITAL_COMMUNITY): Payer: Medicare Other

## 2017-12-25 DIAGNOSIS — Z515 Encounter for palliative care: Secondary | ICD-10-CM

## 2017-12-25 DIAGNOSIS — Z7189 Other specified counseling: Secondary | ICD-10-CM

## 2017-12-25 LAB — COMPREHENSIVE METABOLIC PANEL
ALT: 11 U/L — ABNORMAL LOW (ref 14–54)
AST: 18 U/L (ref 15–41)
Albumin: 2 g/dL — ABNORMAL LOW (ref 3.5–5.0)
Alkaline Phosphatase: 157 U/L — ABNORMAL HIGH (ref 38–126)
Anion gap: 10 (ref 5–15)
BILIRUBIN TOTAL: 0.7 mg/dL (ref 0.3–1.2)
BUN: 72 mg/dL — ABNORMAL HIGH (ref 6–20)
CO2: 20 mmol/L — ABNORMAL LOW (ref 22–32)
Calcium: 7.8 mg/dL — ABNORMAL LOW (ref 8.9–10.3)
Chloride: 109 mmol/L (ref 101–111)
Creatinine, Ser: 3.29 mg/dL — ABNORMAL HIGH (ref 0.44–1.00)
GFR calc Af Amer: 15 mL/min — ABNORMAL LOW (ref >60)
GFR calc non Af Amer: 13 mL/min — ABNORMAL LOW (ref >60)
Glucose, Bld: 141 mg/dL — ABNORMAL HIGH (ref 65–99)
POTASSIUM: 3.9 mmol/L (ref 3.5–5.1)
Sodium: 139 mmol/L (ref 135–145)
TOTAL PROTEIN: 5.6 g/dL — AB (ref 6.5–8.1)

## 2017-12-25 LAB — CBC WITH DIFFERENTIAL/PLATELET
BASOS PCT: 0 %
Basophils Absolute: 0.1 10*3/uL (ref 0.0–0.1)
Eosinophils Absolute: 0.3 10*3/uL (ref 0.0–0.7)
Eosinophils Relative: 2 %
HEMATOCRIT: 30.4 % — AB (ref 36.0–46.0)
HEMOGLOBIN: 9.5 g/dL — AB (ref 12.0–15.0)
Lymphocytes Relative: 10 %
Lymphs Abs: 1.3 10*3/uL (ref 0.7–4.0)
MCH: 27.1 pg (ref 26.0–34.0)
MCHC: 31.3 g/dL (ref 30.0–36.0)
MCV: 86.6 fL (ref 78.0–100.0)
Monocytes Absolute: 1.4 10*3/uL — ABNORMAL HIGH (ref 0.1–1.0)
Monocytes Relative: 11 %
NEUTROS ABS: 9.8 10*3/uL — AB (ref 1.7–7.7)
NEUTROS PCT: 77 %
Platelets: 268 10*3/uL (ref 150–400)
RBC: 3.51 MIL/uL — AB (ref 3.87–5.11)
RDW: 15.9 % — AB (ref 11.5–15.5)
WBC: 12.8 10*3/uL — AB (ref 4.0–10.5)

## 2017-12-25 LAB — GLUCOSE, CAPILLARY
GLUCOSE-CAPILLARY: 154 mg/dL — AB (ref 65–99)
Glucose-Capillary: 144 mg/dL — ABNORMAL HIGH (ref 65–99)
Glucose-Capillary: 144 mg/dL — ABNORMAL HIGH (ref 65–99)

## 2017-12-25 LAB — PHOSPHORUS: Phosphorus: 3.8 mg/dL (ref 2.5–4.6)

## 2017-12-25 LAB — MAGNESIUM: Magnesium: 2 mg/dL (ref 1.7–2.4)

## 2017-12-25 MED ORDER — MENTHOL 3 MG MT LOZG
1.0000 | LOZENGE | OROMUCOSAL | Status: DC | PRN
  Filled 2017-12-25: qty 9

## 2017-12-25 MED ORDER — PIPERACILLIN-TAZOBACTAM 3.375 G IVPB
3.3750 g | Freq: Three times a day (TID) | INTRAVENOUS | Status: DC
Start: 2017-12-25 — End: 2017-12-26
  Administered 2017-12-25 – 2017-12-26 (×3): 3.375 g via INTRAVENOUS
  Filled 2017-12-25 (×4): qty 50

## 2017-12-25 MED ORDER — BUMETANIDE 1 MG PO TABS
2.0000 mg | ORAL_TABLET | Freq: Every day | ORAL | Status: DC
  Administered 2017-12-25 – 2017-12-28 (×4): 2 mg via ORAL
  Filled 2017-12-25 (×5): qty 2

## 2017-12-25 NOTE — Progress Notes (Signed)
Pharmacy Antibiotic Note  Jodi Underwood is a 72 y.o. female admitted on 12/20/2017 with sigmoid colitis. Previously being treated for UTI at facility with Rocephin; now found to have candiduria.  Pharmacy has been consulted for fluconazole dosing, pt also with intra-abdominal infection per which pharmacy is doing zosyn.  12/25/2017 Scr 3.29, CrCl ~ 25.1 mls/min WBC 12.8 afebrile  Plan:  Continue Diflucan 200 mg IV q24 hr - Typical dosing in normal renal function is 200 mg/d, but using 3 mg/kg in a morbidly obese patient, this is a 50% dose reduction which is appropriate given her current CrCl  Increase zosyn to 3.375mg  IV q8h extended interval for CrCL > 68mls/min  Height: 5\' 6"  (167.6 cm) Weight: (!) 370 lb 13 oz (168.2 kg) IBW/kg (Calculated) : 59.3  Temp (24hrs), Avg:97.6 F (36.4 C), Min:97.5 F (36.4 C), Max:97.7 F (36.5 C)  Recent Labs  Lab 12/20/17 1147 12/21/17 0415 12/21/17 0845 12/22/17 0420 12/23/17 0402 12/24/17 0427 12/25/17 0447  WBC  --   --  20.1* 21.3* 17.0* 16.1* 12.8*  CREATININE  --  4.22*  --  4.20* 4.07* 3.76* 3.29*  LATICACIDVEN 1.53  --   --   --   --   --   --     Estimated Creatinine Clearance: 25.1 mL/min (A) (by C-G formula based on SCr of 3.29 mg/dL (H)).    Allergies  Allergen Reactions  . Ciprofloxacin Other (See Comments)    Hallucinations     Thank you for allowing pharmacy to be a part of this patient's care.  Dolly Rias RPh 12/25/2017, 12:58 PM Pager [phone]

## 2017-12-25 NOTE — Consult Note (Signed)
Consultation Note Date: 12/25/2017   Patient Name: Jodi Underwood  DOB: 1946-05-21  MRN: [id number]  Age / Sex: 72 y.o., female  PCP: Jodi Marble, MD Referring Physician: Kerney Elbe, DO  Reason for Consultation: Establishing goals of care  HPI/Patient Profile: 72 y.o. female admitted on 12/20/2017    Clinical Assessment and Goals of Care:  72 year old lady was reportedly at [facility] undergoing rehabilitation, has had gradually progressively declining health since November 2018, underlying history of chronic lymphedema, high BMI, hypertension, stage III chronic kidney disease, obstructive sleep apnea has been admitted to hospital medicine service for generalized weakness acute kidney injury diverticulitis sigmoid colitis. She is being followed by renal colleagues. She remains admitted to hospital medicine service.  A palliative consultation has been requested for goals of care discussions.  Patient is resting in bed. At times she does not engage much. She has been requiring CPAP during the day. She has volume overload, peripheral edema, also came in with acute kidney injury. Cautious diuresis is being attempted. She initially underwent fluid resuscitation.  I introduced myself and palliative care as follows: Palliative medicine is specialized medical care for people living with serious illness. It focuses on providing relief from the symptoms and stress of a serious illness. The goal is to improve quality of life for both the patient and the family.  Patient states she would like to go home. She has mostly been bed bound for some time now. She has several underlying co morbidities that place her at high risk for ongoing decompensation/decline. No family present at the bedside currently.  See additional discussions/recommendations below. Thank you for the consult.  NEXT OF KIN  daughter Gilmore Laroche [phone]  SUMMARY OF RECOMMENDATIONS   Full code Agree with physical therapy consultation Consider skilled nursing facility rehabilitation attempt with palliative services following Continue current mode of care, ongoing efforts at correcting volume status, correcting renal function as best as possible.  Recommend chaplain consult for completion of advance directives  Code Status/Advance Care Planning:  Full code    Symptom Management:    as above  Palliative Prophylaxis:   Bowel Regimen  Additional Recommendations (Limitations, Scope, Preferences):  Full Scope Treatment  Psycho-social/Spiritual:   Desire for further Chaplaincy support:yes  Additional Recommendations: Caregiving  Support/Resources  Prognosis:   Unable to determine  Discharge Planning: [facility] for rehab with Palliative care service follow-up      Primary Diagnoses: Present on Admission: . Morbid obesity ([place]) . Essential hypertension . Asthma . UTI (urinary tract infection) . Acute on chronic renal failure ([place]) . Constipation . AKI (acute kidney injury) ([place])   I have reviewed the medical record, interviewed the patient and family, and examined the patient. The following aspects are pertinent.  Past Medical History:  Diagnosis Date  . Anemia   . Arthritis   . Asthma   . Atrial fibrillation ([place])   . Degenerative disc disease   . Diabetes mellitus   . Diabetic neuropathy ([place])   . Edema extremities   .  Hypertension   . Lymphedema   . Pneumonia   . Urinary incontinence    Social History   Socioeconomic History  . Marital status: Divorced    Spouse name: Not on file  . Number of children: Not on file  . Years of education: Not on file  . Highest education level: Not on file  Occupational History  . Not on file  Social Needs  . Financial resource strain: Not on file  . Food insecurity:    Worry: Not on file    Inability: Not on file    . Transportation needs:    Medical: Not on file    Non-medical: Not on file  Tobacco Use  . Smoking status: Never Smoker  . Smokeless tobacco: Never Used  Substance and Sexual Activity  . Alcohol use: Yes    Comment: occassionally  . Drug use: No  . Sexual activity: Not on file  Lifestyle  . Physical activity:    Days per week: Not on file    Minutes per session: Not on file  . Stress: Not on file  Relationships  . Social connections:    Talks on phone: Not on file    Gets together: Not on file    Attends religious service: Not on file    Active member of club or organization: Not on file    Attends meetings of clubs or organizations: Not on file    Relationship status: Not on file  Other Topics Concern  . Not on file  Social History Narrative  . Not on file   Family History  Adopted: Yes   Scheduled Meds: . amiodarone  200 mg Oral Daily  . bumetanide  2 mg Oral Daily  . guaiFENesin  600 mg Oral Q12H  . heparin  5,000 Units Subcutaneous Q8H  . insulin aspart  0-9 Units Subcutaneous TID WC  . levothyroxine  50 mcg Oral QAC breakfast  . Melatonin  3 mg Oral QHS  . montelukast  10 mg Oral Daily  . sodium chloride flush  10-40 mL Intracatheter Q12H   Continuous Infusions: . fluconazole (DIFLUCAN) IV Stopped (12/24/17 1839)  . piperacillin-tazobactam (ZOSYN)  IV 3.375 g (12/25/17 1453)   PRN Meds:.hydrALAZINE, menthol-cetylpyridinium, ondansetron (ZOFRAN) IV **OR** ondansetron, sodium chloride flush, traMADol Medications Prior to Admission:  Prior to Admission medications   Medication Sig Start Date End Date Taking? Authorizing Provider  acetaminophen (TYLENOL) 325 MG tablet Take 650 mg by mouth every 6 (six) hours as needed (For pain.).   Yes [provider]  albuterol (ACCUNEB) 0.63 MG/3ML nebulizer solution Take 1 ampule by nebulization every 6 (six) hours as needed for wheezing or shortness of breath.   Yes [provider]  amiodarone (PACERONE)  200 MG tablet Take 200 mg by mouth daily. 08/25/17  Yes [provider]  amLODipine (NORVASC) 5 MG tablet Take 5 mg by mouth daily.   Yes [provider]  bumetanide (BUMEX) 2 MG tablet Take 2 mg by mouth daily. 11/09/17  Yes [provider]  cefTRIAXone (ROCEPHIN) 1 g SOLR injection Inject 1 g into the vein at bedtime.   Yes [provider]  cetirizine (ZYRTEC ALLERGY) 10 MG tablet Take 10 mg by mouth daily.   Yes [provider]  cyclobenzaprine (FLEXERIL) 10 MG tablet Take 10 mg by mouth every 6 (six) hours as needed for muscle spasms.   Yes [provider]  docusate sodium (COLACE) 100 MG capsule Take 100 mg by  mouth every other day.   Yes [provider]  fluticasone (FLONASE) 50 MCG/ACT nasal spray Place 2 sprays into both nostrils daily. Patient taking differently: Place 1 spray into both nostrils every 12 (twelve) hours.  10/20/16  Yes Patrecia Pour, Christean Grief, MD  guaiFENesin (MUCINEX) 600 MG 12 hr tablet Take 600 mg by mouth every 12 (twelve) hours.   Yes [provider]  levothyroxine (SYNTHROID, LEVOTHROID) 50 MCG tablet Take 50 mcg by mouth daily. 11/08/17  Yes [provider]  Melatonin 3 MG TABS Take 3 mg by mouth at bedtime.   Yes [provider]  metoprolol tartrate (LOPRESSOR) 25 MG tablet Take 25 mg by mouth every 12 (twelve) hours. 11/08/17  Yes [provider]  montelukast (SINGULAIR) 10 MG tablet Take 10 mg by mouth daily. 08/24/17  Yes [provider]  ondansetron (ZOFRAN) 8 MG tablet Take by mouth every 8 (eight) hours as needed for nausea or vomiting.   Yes [provider]  potassium chloride (K-DUR,KLOR-CON) 10 MEQ tablet Take 10 mEq by mouth 3 (three) times daily.   Yes [provider]  promethazine (PHENERGAN) 25 MG tablet Take 25 mg by mouth every 6 (six) hours as needed for nausea or vomiting.   Yes [provider]  senna (SENOKOT) 8.6 MG TABS tablet  Take 1 tablet (8.6 mg total) by mouth daily. Patient taking differently: Take 1 tablet by mouth every other day.  06/17/17  Yes Patrecia Pour, MD  simethicone (MYLICON) 80 MG chewable tablet Chew 80 mg by mouth every 6 (six) hours as needed for flatulence.   Yes [provider]  traMADol (ULTRAM) 50 MG tablet Take 50 mg by mouth every 6 (six) hours as needed (For pain.).   Yes [provider]   Allergies  Allergen Reactions  . Ciprofloxacin Other (See Comments)    Hallucinations   Review of Systems +weakness  Physical Exam morbidly obese lady resting in bed Patient appears sleepy at times, at times awakens fully and answers questions appropriately Diminished breath sounds S1-S2 Abdomen distended due to body habitus Chronic lymphedema both lower extremities  Vital Signs: BP (!) 145/61 (BP Location: Right Arm)   Pulse 85   Temp (!) 97.5 F (36.4 C) (Oral)   Resp 17   Ht 5\' 6"  (1.676 m)   Wt (!) 168.2 kg (370 lb 13 oz)   SpO2 100%   BMI 59.85 kg/m  Pain Scale: 0-10 POSS *See Group Information*: S-Acceptable,Sleep, easy to arouse Pain Score: 0-No pain   SpO2: SpO2: 100 % O2 Device:SpO2: 100 % O2 Flow Rate: .O2 Flow Rate (L/min): 3 L/min  IO: Intake/output summary:   Intake/Output Summary (Last 24 hours) at 12/25/2017 1601 Last data filed at 12/25/2017 1520 Gross per 24 hour  Intake 2625.83 ml  Output -  Net 2625.83 ml    LBM: Last BM Date: 12/24/17 Baseline Weight: Weight: (!) 157.9 kg (348 lb 1.7 oz)(Simultaneous filing. User may not have seen previous data.) Most recent weight: Weight: (!) 168.2 kg (370 lb 13 oz)     Palliative Assessment/Data:   PPS 40%  Time In:  1500 Time Out:  1600 Time Total:  60 min  Greater than 50%  of this time was spent counseling and coordinating care related to the above assessment and plan.  Signed by: Loistine Chance, MD  [phone]  Please contact Palliative Medicine Team phone at [phone] for questions and  concerns.  For individual provider: See Shea Evans

## 2017-12-25 NOTE — Progress Notes (Signed)
PROGRESS NOTE    Jodi Underwood  TKZ:[id number] DOB: 10/26/1945 DOA: 12/20/2017 PCP: Jodi Marble, MD   Brief Narrative:   HPI: Jodi Underwood is a 72 y.o. female with medical history significant of morbid obesity, chronic lymphedema, hypertension, hypothyroidism, CKD stage 3 sleep apnea, on oxygen on baseline who was sent from [facility] with complaints of generalized weakness, altered mental status and acute kidney injury.  Patient is a poor historian.  Patient was really not sure why they sent her to the emergency department .But she says she was not really feeling well since last few days.  She reported that she was not having good urinary output and was very thirsty .  Patient was admitted here and was discharged on 06/16/2017 after management for A. fib with RVR.  She was discharged on warfarin but currently she is not on anticoagulation and heart rate is controlled.  Patient found to have a creatinine of 4.35 on presentation with elevated BUN. She also has mild elevated white cell counts.  She was admitted for acute on chronic kidney disease stage III, urinary tract infection, and sigmoid colitis. Renal Function was not improving much with IVF hydration so Nephrology was consulted and they have stopped BP medications and given fluid boluses and Cr is slowly trending down now. She appears to have volume overload now so started home Diuresis and resumed home Bumex. Will continue to monitor and if Cr worsens will stop Bumex again and Re-Consult Nephrology.   Assessment & Plan:   Principal Problem:   Acute on chronic renal failure (HCC) Active Problems:   Morbid obesity ([place])   Essential hypertension   Asthma   Lymphedema   UTI (urinary tract infection)   Constipation   AKI (acute kidney injury) ([place])   A-fib (HCC)   Chronic systolic CHF (congestive heart failure) ([place])   Encounter for palliative care   Goals of care,  counseling/discussion  AKI on CKD stage III, slowly improving  -Her last creatinine was 1.77 on 06/16/2017.   -Baseline creatinine ranges from 1.4-2.3.   -Patient looks dry and dehydrated.  -She was on Bumetanide at the skilled nursing facility which has been held. -Continue to hydrate the patient.  Foley to be inserted for Strict I's/O's. -Acute kidney injury could be from volume depletion versus UTI versus obstructive pathology.   -Checked CT stone study and showed Moderate nonspecific sigmoid colitis, which could be infectious or ischemic in etiology. No evidence of abscess or free fluid. Moderate paraumbilical hernia containing transverse colon and fat. Tiny nonobstructing calculi within the right renal collecting system. No evidence of ureteral calculi or hydronephrosis. -BUN/creatinine went from 66/4.35 and is now 72/3.29 -Obtained a Nephrology consultation as Kidney function not really improving as they recommending D/Cing Norvasc and Metoprolol and recommended giving Boluses -Patient is not a Dialysis Candidate per Nephrology -Checked urine sodium and was 17, urine creatinine was 142; FENa was 0.4% suggesting Pre-Renal Causes -Renal U/S showed Negative for age ultrasound appearance of both kidneys. Contracted urinary bladder with mild to moderate nonspecific wall thickening. -IVF rate decreased to 50 mL and now Stopped. Resumed Home Bumex Today -Continue to Monitor and Repeat CMP in AM  -Palliative Care consulted and continuing current plan and obtaining PT Evaluation   Fungal Urinary Tract Infection -It was reported that patient was been treated with ceftriaxone for urinary tract infection.   -Denies any fever or chills. -U/A showed hemoglobin, large leukocytes, rare bacteria, greater than 50 WBCs  and urine culture showed thousand colonies of yeast -Patient was started empirically on IV ceftriaxone will continue for now given her possible sigmoid colitis and we will also start  patient on IV fluconazole with pharmacy to dose for the Yeast in her urine -Repeat Urine Culture showed 60,000 CFU of Yeast -C/w IV Diflucan as above and Continue Treatment at least 7-10 Days  Sleep Apnea / Obesity Hypoventilation syndrome/chronic respiratory insufficiency:  -Recently started on oxygen.  -Currently her sats are fine with supplemental oxygen.   -Continue CPAP here and she has Daytime Somnolence -C/w CPAP during the Day  Heart failure with reduced ejection fraction:  -Echocardiogram on 11/18 shows ejection fraction of 50%, hypokinesis of anteroseptal, inferior and inferoseptal myocardium.   -BNP was 202.4 -Patient was on Bumetanide at nursing home and will resume today  -Not on ACE or ARB given Renal Insufficiency    -On Metoprolol Tartrate 25 mg po q12h -Diuresis will be held as patient is hypovolemic. -Patient is +11,285 Liters and Weight was 348 lbs and increased to 370 -Discontinued IVF Hydration at a rate of 50 mL/hr and resumed Home Bumetanide   Morbid Obesity/Lymphedema:  -Patient is debilitated and bedbound due to her morbid obesity and lymphedema.   -She has not ambulated since November 2018.   -She says she has lost more than 100 pounds in last year. -[place] nurse for Lymphedema -Turn every 2 hours but patient refusing to be turned and refusing Bariatric Bed earlier but agreeable to it now  History of A. Fib -Was discharged on Coumadin as per the records on November 2018.   -Currently not on any anticoagulation.   -EKG showed normal sinus rhythm. -On Amiodarone 200 mg po Daily but Metoprolol 25 mg po q12h Discontinued by Nephrology  Hypothyroidism  -TSH was 17.474 -? Compliance with Synthroid -Checked Free T4 and was 0.84 and T3 was 51 (low) -C/w Levothyroxine 50 mcg po Daily for now and may need to titrate up  Hypertension -Currently blood pressure stable at 145/61 -Her home meds of Amlodipine 5 mg po Daily and Metoprolol have been D/C'd by  Nephro. -Continue to Follow BP's   Asthma -Currently stable -Currently not in Exacerbation -C/w Montelukast 10 mg po Daily  -If necessary will add Nebs  Sigmoid Colitis, improving  -Seen on CT Renal Stone Study -Abx of IV Ceftriaxone and IV Metronidazole changed to IV Zosyn -Obtain Blood Cx x2 and showed NGTD at 4 Days -Symptomatic Treatment and Anti-emetics -C/w IVF Rehydration of NS at 50 mL/hr -Continue Clear Liquid Diet for now and Advance to FULL Liquid   -GI pathogen panel by PCR still pending; Having diarrhea and had at least 3 bowel movements  -WBC worsened from 14.7 -> 20.1 -> 21.3 but now improving on Zosyn and trending down to 12.8 -Continue to Monitor extremely Closely and repeat CBC in AM   Hypomagnesemia -Patient magnesium level this morning was 1.5 and improved to 2.0 -Continue to monitor and replete as necessary -Repeat magnesium level in a.m.  Hyperglycemia in the setting of Prediabetes -Patient's hemoglobin A1c was 6.1 -Add sensitive NovoLog sliding scale insulin before meals -Continue to monitor CBGs; CBG's ranging from 144-154  Normocytic Anemia -Patient's Hb/Hct Stable at 9.5/30.4 -Continue to Monitor for S/Sx of Bleeding  -Repeat CBC in AM   Hyponatremia -Mild at 134 and now improved to 139 -C/w IVF Hydration -Repeat CMP in AM  Hyperphosphatemia,improved   -Patient's Phos Level was 4.8 and now 3.8 -Continue to Monitor and Repeat Phos  Level in AM  Somnolence/Confusion/Encephalopathy -Patient thought she was in a car going for a ride yesteday  -Checked ABG and showed 7.217/45.5/60.4/17.8/89% on Room Air -Received Melatonin Last night -Checked Head CT and was Negative  -?Related to OSA and Sleep Apnea with Day time somnolence   DVT prophylaxis: Heparin 5,000 units sq q8h Code Status: FULL CODE Family Communication: No family present at bedside Disposition Plan: Remain Inpatient for current workup and treatment  Consultants:    Nephrology  Palliative Care Medicine    Procedures: None   Antimicrobials:  Anti-infectives (From admission, onward)   Start     Dose/Rate Route Frequency Ordered Stop   12/25/17 1400  piperacillin-tazobactam (ZOSYN) IVPB 3.375 g     3.375 g 12.5 mL/hr over 240 Minutes Intravenous Every 8 hours 12/25/17 1259     12/22/17 1100  piperacillin-tazobactam (ZOSYN) IVPB 2.25 g  Status:  Discontinued     2.25 g 100 mL/hr over 30 Minutes Intravenous Every 8 hours 12/22/17 1003 12/25/17 1259   12/21/17 1800  fluconazole (DIFLUCAN) IVPB 200 mg  Status:  Discontinued     200 mg 100 mL/hr over 60 Minutes Intravenous  Once 12/21/17 1704 12/21/17 1715   12/21/17 1800  fluconazole (DIFLUCAN) IVPB 200 mg     200 mg 100 mL/hr over 60 Minutes Intravenous Every 24 hours 12/21/17 1715     12/20/17 1800  metroNIDAZOLE (FLAGYL) IVPB 500 mg  Status:  Discontinued     500 mg 100 mL/hr over 60 Minutes Intravenous Every 8 hours 12/20/17 1716 12/22/17 0955   12/20/17 1400  cefTRIAXone (ROCEPHIN) 2 g in sodium chloride 0.9 % 100 mL IVPB  Status:  Discontinued     2 g 200 mL/hr over 30 Minutes Intravenous Every 24 hours 12/20/17 1355 12/22/17 0955     Subjective: Seen and examined and was alert and states abdominal pain is improved. Felt more swollen. No CP or SOB. Was Nauseous today but willing to try Soft Diet today.   Objective: Vitals:   12/24/17 2239 12/25/17 0551 12/25/17 0916 12/25/17 1512  BP: (!) 128/52 (!) 128/58  (!) 145/61  Pulse: 85 82  85  Resp: 18 18  17   Temp: 97.7 F (36.5 C) 97.6 F (36.4 C)  (!) 97.5 F (36.4 C)  TempSrc: Oral Oral  Oral  SpO2: 92% 100%  100%  Weight:   (!) 168.2 kg (370 lb 13 oz)   Height:        Intake/Output Summary (Last 24 hours) at 12/25/2017 1835 Last data filed at 12/25/2017 1833 Gross per 24 hour  Intake 1835.83 ml  Output 680 ml  Net 1155.83 ml   Filed Weights   12/20/17 1600 12/25/17 0916  Weight: (!) 157.9 kg (348 lb 1.7 oz) (!) 168.2 kg  (370 lb 13 oz)   Examination: Physical Exam:  Constitutional: Well-nourished, well-developed morbidly obese female who is awake and alert and in no acute distress. Eyes: Sclera anicteric.  Lids normal ENMT: External ears and nose appear normal.  Grossly normal hearing Neck: Appears supple with no appreciable JVD however this is difficult to assess secondary body habitus Respiratory: Diminished to auscultation bilaterally.  Patient is unlabored breathing and had no appreciable wheezing, rales, rhonchi.  Mildly tachypneic Cardiovascular: Them.  No appreciable murmurs, rubs, gallops with chronic lymphedema changes and slightly more swelling today. Abdomen: Soft, nontender to palpate.  Distended secondary body habitus.  Bowel sounds present x4 GU: Has a Foley catheter in place Musculoskeletal: No contractures cyanosis  noted. Skin: Skin is warm and dry and patient has chronic lymphedema changes in the lower extremities.  Left leg is more swollen than right Neurologic: Cranial nerves II through XII grossly intact with no appreciable focal deficits Psychiatric: Wake and alert.  Was not as sleepy as yesterday.  Slightly depressed mood and affect  Data Reviewed: I have personally reviewed following labs and imaging studies  CBC: Recent Labs  Lab 12/21/17 0845 12/22/17 0420 12/23/17 0402 12/24/17 0427 12/25/17 0447  WBC 20.1* 21.3* 17.0* 16.1* 12.8*  NEUTROABS 14.4 16.0* 13.4* 12.7* 9.8*  HGB 9.8* 10.2* 10.0* 9.6* 9.5*  HCT 31.8* 33.2* 32.5* 31.0* 30.4*  MCV 86.2 86.0 85.8 85.9 86.6  PLT 294 287 266 286 532   Basic Metabolic Panel: Recent Labs  Lab 12/21/17 0415 12/22/17 0420 12/23/17 0402 12/24/17 0427 12/25/17 0447  NA 135 136 134* 137 139  K 4.8 4.7 4.3 4.0 3.9  CL 99* 101 103 105 109  CO2 24 24 20* 20* 20*  GLUCOSE 157* 127* 130* 138* 141*  BUN 70* 78* 77* 79* 72*  CREATININE 4.22* 4.20* 4.07* 3.76* 3.29*  CALCIUM 8.1* 8.1* 7.9* 8.0* 7.8*  MG 1.5* 2.1 2.1 2.1 2.0  PHOS  3.5 4.5 4.8* 4.5 3.8   GFR: Estimated Creatinine Clearance: 25.1 mL/min (A) (by C-G formula based on SCr of 3.29 mg/dL (H)). Liver Function Tests: Recent Labs  Lab 12/21/17 0415 12/22/17 0420 12/23/17 0402 12/24/17 0427 12/25/17 0447  AST 14* 15 13* 15 18  ALT 10* 11* 13* 11* 11*  ALKPHOS 73 92 90 134* 157*  BILITOT 1.0 0.6 0.6 0.8 0.7  PROT 5.9* 5.9* 5.8* 5.8* 5.6*  ALBUMIN 2.4* 2.2* 2.1* 2.2* 2.0*   No results for input(s): LIPASE, AMYLASE in the last 168 hours. Recent Labs  Lab 12/20/17 1140  AMMONIA 16   Coagulation Profile: No results for input(s): INR, PROTIME in the last 168 hours. Cardiac Enzymes: No results for input(s): CKTOTAL, CKMB, CKMBINDEX, TROPONINI in the last 168 hours. BNP (last 3 results) No results for input(s): PROBNP in the last 8760 hours. HbA1C: No results for input(s): HGBA1C in the last 72 hours. CBG: Recent Labs  Lab 12/24/17 1648 12/24/17 2245 12/25/17 0731 12/25/17 1144 12/25/17 1731  GLUCAP 119* 115* 144* 154* 144*   Lipid Profile: No results for input(s): CHOL, HDL, LDLCALC, TRIG, CHOLHDL, LDLDIRECT in the last 72 hours. Thyroid Function Tests: No results for input(s): TSH, T4TOTAL, FREET4, T3FREE, THYROIDAB in the last 72 hours. Anemia Panel: No results for input(s): VITAMINB12, FOLATE, FERRITIN, TIBC, IRON, RETICCTPCT in the last 72 hours. Sepsis Labs: Recent Labs  Lab 12/20/17 1147  LATICACIDVEN 1.53    Recent Results (from the past 240 hour(s))  Culture, Urine     Status: Abnormal   Collection Time: 12/20/17  2:19 PM  Result Value Ref Range Status   Specimen Description   Final    URINE, RANDOM Performed at [facility], [place] [address], [place], [place] [address]    Special Requests   Final    NONE Performed at [facility], [place] [address] [place], [place] [address]    Culture 40,000 COLONIES/mL YEAST (A)  Final   Report Status 12/21/2017 FINAL  Final  Culture, blood  (routine x 2)     Status: None (Preliminary result)   Collection Time: 12/21/17  4:41 PM  Result Value Ref Range Status   Specimen Description   Final    BLOOD LEFT HAND Performed  at [facility], [place] [address], [place], [place] [address]    Special Requests   Final    BOTTLES DRAWN AEROBIC ONLY Blood Culture adequate volume Performed at [place] [address] [place], [place] [address]    Culture   Final    NO GROWTH 4 DAYS Performed at [facility], [place] [address], [place], [place] [address]    Report Status PENDING  Incomplete  Culture, blood (routine x 2)     Status: None (Preliminary result)   Collection Time: 12/21/17  4:43 PM  Result Value Ref Range Status   Specimen Description   Final    BLOOD RIGHT ANTECUBITAL Performed at [place] [address], [place], [place] [address]    Special Requests   Final    BOTTLES DRAWN AEROBIC AND ANAEROBIC Blood Culture adequate volume Performed at [place] [address], [place], [place] [address]    Culture   Final    NO GROWTH 4 DAYS Performed at [facility], [place] [address] [place], [place] [address]    Report Status PENDING  Incomplete  Culture, Urine     Status: Abnormal   Collection Time: 12/21/17  5:43 PM  Result Value Ref Range Status   Specimen Description   Final    URINE, RANDOM Performed at [place] [address], [place], [place] [address]    Special Requests   Final    NONE Performed at [facility], [place] [address], [place], [place] [address]    Culture 60,000 COLONIES/mL YEAST (A)  Final   Report Status 12/22/2017 FINAL  Final  Gastrointestinal Panel by PCR , Stool     Status: None   Collection Time: 12/22/17  3:59 AM  Result Value Ref Range Status   Campylobacter species NOT DETECTED NOT DETECTED Final   Plesimonas shigelloides NOT DETECTED NOT DETECTED Final    Salmonella species NOT DETECTED NOT DETECTED Final   Yersinia enterocolitica NOT DETECTED NOT DETECTED Final   Vibrio species NOT DETECTED NOT DETECTED Final   Vibrio cholerae NOT DETECTED NOT DETECTED Final   Enteroaggregative E coli (EAEC) NOT DETECTED NOT DETECTED Final   Enteropathogenic E coli (EPEC) NOT DETECTED NOT DETECTED Final   Enterotoxigenic E coli (ETEC) NOT DETECTED NOT DETECTED Final   Shiga like toxin producing E coli (STEC) NOT DETECTED NOT DETECTED Final   Shigella/Enteroinvasive E coli (EIEC) NOT DETECTED NOT DETECTED Final   Cryptosporidium NOT DETECTED NOT DETECTED Final   Cyclospora cayetanensis NOT DETECTED NOT DETECTED Final   Entamoeba histolytica NOT DETECTED NOT DETECTED Final   Giardia lamblia NOT DETECTED NOT DETECTED Final   Adenovirus F40/41 NOT DETECTED NOT DETECTED Final   Astrovirus NOT DETECTED NOT DETECTED Final   Norovirus GI/GII NOT DETECTED NOT DETECTED Final   Rotavirus A NOT DETECTED NOT DETECTED Final   Sapovirus (I, II, IV, and V) NOT DETECTED NOT DETECTED Final    Comment: Performed at [facility], [address] [place], [place] [address]     Radiology Studies: Ct Head Wo Contrast  Result Date: 12/24/2017 CLINICAL DATA:  Unexplained altered level of consciousness. EXAM: CT HEAD WITHOUT CONTRAST TECHNIQUE: Contiguous axial images were obtained from the base of the skull through the vertex without intravenous contrast. COMPARISON:  None. FINDINGS: Brain: No evidence of acute infarction, hemorrhage, hydrocephalus, extra-axial collection, or mass lesion/mass effect. Vascular:  No hyperdense vessel or  other acute findings. Skull: No evidence of fracture or other significant bone abnormality. Diffuse hyperostosis interna incidentally noted. Sinuses/Orbits:  No acute findings. Other: None. IMPRESSION: No intracranial abnormality or other acute findings. Electronically Signed   By: Earle Gell M.D.   On: 12/24/2017 11:14   Dg Chest Port 1  View  Result Date: 12/25/2017 CLINICAL DATA:  Short of breath EXAM: PORTABLE CHEST 1 VIEW COMPARISON:  12/20/2017 FINDINGS: Suboptimal image due to large patient size and rotation. Mild bibasilar airspace disease.  No definite edema or effusion IMPRESSION: Suboptimal image. Recommend repeating. Two-view chest x-ray is suggested if possible. Mild bibasilar atelectasis/infiltrate. Electronically Signed   By: Franchot Gallo M.D.   On: 12/25/2017 08:51   Scheduled Meds: . amiodarone  200 mg Oral Daily  . bumetanide  2 mg Oral Daily  . guaiFENesin  600 mg Oral Q12H  . heparin  5,000 Units Subcutaneous Q8H  . insulin aspart  0-9 Units Subcutaneous TID WC  . levothyroxine  50 mcg Oral QAC breakfast  . Melatonin  3 mg Oral QHS  . montelukast  10 mg Oral Daily  . sodium chloride flush  10-40 mL Intracatheter Q12H   Continuous Infusions: . fluconazole (DIFLUCAN) IV Stopped (12/24/17 1839)  . piperacillin-tazobactam (ZOSYN)  IV Stopped (12/25/17 1853)    LOS: 5 days   Kerney Elbe, DO [facility] Pager [phone]  If 7PM-7AM, please contact night-coverage [email or web address] Password [facility] 12/25/2017, 6:35 PM

## 2017-12-25 NOTE — Clinical Social Work Note (Signed)
Clinical Social Work Assessment  Patient Details  Name: Jodi Underwood MRN: [id number] Date of Birth: April 26, 1946  Date of referral:  12/25/17               Reason for consult:  Facility Placement, Discharge Planning                Permission sought to share information with:  Facility Sport and exercise psychologist, Family Supports Permission granted to share information::  Yes, Verbal Permission Granted  Name::     Jodi Underwood; Jodi Underwood; Jodi Underwood::     Relationship::  daughters   Contact Information:  [phone]; [phone]; [phone]  Housing/Transportation Living arrangements for the past 2 months:  [place] of Information:  Adult Children, Patient Patient Interpreter Needed:  None Criminal Activity/Legal Involvement Pertinent to Current Situation/Hospitalization:  No - Comment as needed Significant Relationships:  Adult Children Lives with:  Facility Resident Do you feel safe going back to the place where you live?    Need for family participation in patient care:  Yes (Comment)  Care giving concerns:  Patient admitted from [facility] SNF. SNF staff reported that patient was admitted to SNF on 11/26/17 for ST rehab from [facility]. Patient's daughter reported that patient has not been home alone since November 2018, noting patient has been between hospitals and SNFs since November 2018. PT ordered, evaluation pending.   Social Worker assessment / plan:  CSW spoke with patient at bedside regarding discharge planning. Patient sleeping and was hard to arouse, patient's RN came into room and got patient to wake up to speak with CSW. Patient difficult to understand at times. CSW and patient discussed patient's discharge planning and ST rehab at [facility]. CSW informed patient that PT has been ordered and will evaluate patient a make a recommendation for patient's follow up care. Patient reported that she  wanted to go home but is agreeable to return to [facility] SNF if recommended. CSW agreed to follow up with patient after PT evaluation was completed to discuss recommendation and discharge planning further. Patient granted CSW verbal permission to speak with her daughters regarding discharge planning.   CSW spoke with patient's daughter Jodi Underwood) regarding patient's discharge plans. Patient's daughter reported that patient had a recent medication change and that patient is not at her baseline with alertness and orientation. Patient's daughter reported that if patient is agreeable to return to [facility] SNF then she would like patient to return. Patient's daughter reported that she feels patient is making progress at [facility] SNF. CSW agreed to update patient's daughter after PT evaluation is completed.  CSW contacted [facility] SNF and spoke with staff member [name]. Staff confirmed patient's ability to return when medically stable.  CSW will complete FL2 and continue to follow and assist with discharge planning to SNF.   Employment status:  Retired Forensic scientist:  Medicare PT Recommendations:  Not assessed at this time [place] / Referral to community resources:  Other (Comment Required)(Patient admitted from [facility] SNF (short term rehab))  Patient/Family's Response to care:  Patient/patient's daughter appreciative of CSW assistance with dsicharge planning.   Patient/Family's Understanding of and Emotional Response to Diagnosis, Current Treatment, and Prognosis:  Patient presented calm and verbalized some understanding of current treatment plan. Patient and CSW discussed patient's multiple transitions over the past several months. Patient hopeful that she can return home one day. Patient's  daughter involved in patient's care and feels patient is making progress in [place] rehab.   Emotional  Assessment Appearance:  Appears stated age Attitude/Demeanor/Rapport:  Lethargic Affect (typically observed):  Hopeful, Calm Orientation:  Oriented to Self, Oriented to Situation, Oriented to Place(Patient with some intermittent confusion) Alcohol / Substance use:  Not Applicable Psych involvement (Current and /or in the community):  No (Comment)  Discharge Needs  Concerns to be addressed:  Care Coordination Readmission within the last 30 days:  No Current discharge risk:  Dependent with Mobility Barriers to Discharge:  Continued Medical Work up   [employer], LCSW 12/25/2017, 3:23 PM

## 2017-12-26 DIAGNOSIS — I5022 Chronic systolic (congestive) heart failure: Secondary | ICD-10-CM

## 2017-12-26 LAB — CULTURE, BLOOD (ROUTINE X 2)
CULTURE: NO GROWTH
Culture: NO GROWTH
SPECIAL REQUESTS: ADEQUATE
Special Requests: ADEQUATE

## 2017-12-26 LAB — GLUCOSE, CAPILLARY
GLUCOSE-CAPILLARY: 109 mg/dL — AB (ref 65–99)
GLUCOSE-CAPILLARY: 113 mg/dL — AB (ref 65–99)
Glucose-Capillary: 116 mg/dL — ABNORMAL HIGH (ref 65–99)
Glucose-Capillary: 147 mg/dL — ABNORMAL HIGH (ref 65–99)
Glucose-Capillary: 122 mg/dL — ABNORMAL HIGH (ref 65–99)

## 2017-12-26 MED ORDER — PIPERACILLIN-TAZOBACTAM 3.375 G IVPB
3.3750 g | Freq: Three times a day (TID) | INTRAVENOUS | Status: AC
  Administered 2017-12-26 – 2017-12-27 (×5): 3.375 g via INTRAVENOUS
  Filled 2017-12-26 (×4): qty 50

## 2017-12-26 MED ORDER — PREMIER PROTEIN SHAKE
11.0000 [oz_av] | Freq: Two times a day (BID) | ORAL | Status: DC
  Administered 2017-12-27 – 2017-12-28 (×3): 11 [oz_av] via ORAL
  Filled 2017-12-26 (×5): qty 325.31

## 2017-12-26 MED ORDER — PIPERACILLIN-TAZOBACTAM IN DEX 2-0.25 GM/50ML IV SOLN: 2.2500 g | Freq: Three times a day (TID) | INTRAVENOUS | Status: DC

## 2017-12-26 NOTE — Progress Notes (Signed)
PT Cancellation Note  Patient Details Name: Jodi Underwood MRN: [id number] DOB: May 04, 1946   Cancelled Treatment:    Reason Eval/Treat Not Completed: PT screened, no needs identified, will sign off, patient is lethargic, unable to form words. Noted patient has been in LTC facility for past 6 mos. PT needs can be readdressed if indicated. Patient currently requires total care.    Claretha Cooper 12/26/2017, 12:33 PM Tresa Endo PT [phone] '

## 2017-12-26 NOTE — Progress Notes (Signed)
Initial Nutrition Assessment  DOCUMENTATION CODES:   Morbid obesity  INTERVENTION:   Provide Premier Protein BID, each supplement provides 160 kcal and 30 grams of protein.   NUTRITION DIAGNOSIS:   Inadequate oral intake related to lethargy/confusion as evidenced by per patient/family report.  GOAL:   Patient will meet greater than or equal to 90% of their needs  MONITOR:   PO intake, Supplement acceptance, Labs, Weight trends, I & O's  REASON FOR ASSESSMENT:   Consult Assessment of nutrition requirement/status  ASSESSMENT:    72 y.o. female with medical history significant of morbid obesity, chronic lymphedema, hypertension, hypothyroidism, CKD stage 3 sleep apnea, on oxygen on baseline who was sent from [facility] nursing facility with complaints of generalized weakness, altered mental status and acute kidney injury.  Patient is a poor historian.    Patient now on a soft diet, advanced yesterday. Poor PO, pt is lethargic. Pt is volume overloaded, diuresing slowly. Now +10,055 ml since admission 6/6. Pt weighed 348 lb at admission.  Per weight records, pt has lost 71 lb since December 2018 (17% wt loss x 6 months, significant but most likely fluid loss).  Will order Premier Protein supplements to ensure patient is meeting protein needs.  Medications reviewed. Labs reviewed: CBGs: 109-116 Mg/Phos WNL   NUTRITION - FOCUSED PHYSICAL EXAM:  Nutrition focused physical exam shows no sign of depletion of muscle mass or body fat.  Diet Order:   Diet Order           DIET SOFT Room service appropriate? Yes; Fluid consistency: Thin  Diet effective now          EDUCATION NEEDS:   Not appropriate for education at this time  Skin:  Skin Assessment: Reviewed RN Assessment  Last BM:  6/11  Height:   Ht Readings from Last 1 Encounters:  12/20/17 5\' 6"  (1.676 m)    Weight:   Wt Readings from Last 1 Encounters:  12/26/17 (!) 370 lb 9.5 oz (168.1  kg)    Ideal Body Weight:  59.1 kg  BMI:  Body mass index is 59.82 kg/m.  Estimated Nutritional Needs:   Kcal:  2100-2300  Protein:  75-85g  Fluid:  Per MD  Clayton Bibles, MS, RD, LDN [place] Dietitian Pager: [phone] After Hours Pager: [phone]

## 2017-12-26 NOTE — Progress Notes (Signed)
PMT progress note  Patient seen, resting in bed Appears weak, lethargic She is asleep most of the time, how ever, she is able to awaken and answer some questions appropriately.   BP (!) 133/59 (BP Location: Right Arm)   Pulse 78   Temp 97.6 F (36.4 C) (Oral)   Resp 16   Ht 5\' 6"  (1.676 m)   Wt (!) 168.1 kg (370 lb 9.5 oz)   SpO2 100%   BMI 59.82 kg/m  Labs and imaging noted.   Awakens, how ever, appears lethargic Is in no distress Shallow regular respirations S 1 S2 distant heart sounds due to body habitus + lymphedema  Call placed and discussed with daughter Annalese Stiner, she was able to discuss with TRH MD earlier in am. She is appreciative of the information she has received, she is asking about ABG, she thinks that the patient does appear more weaker and is having slurred speech.   We discussed about the acute issues pertaining to this hospitalization: UTI, colitis as well as AKI with underlying III CKD. Daughter is aware of the patient needing SNF on discharge and is agreeable. I did discuss with daughter that the patient has several underlying conditions that place her at high risk of ongoing decline. She is aware.   Paged TRH MD to consider ABG as per daughter request.   AKI III CKD Fungal UTI OSA Lymphedema Colitis.   Continue current mode of care.  Recommend SNF rehab with palliative care to follow on discharge.   25 minutes spent [facility] palliative medicine team [phone]

## 2017-12-26 NOTE — Progress Notes (Signed)
Stuck for ABG X 3.  Unable to obtain sample.  Pt refused further tries.

## 2017-12-26 NOTE — Progress Notes (Signed)
PROGRESS NOTE    Jodi Underwood  WFU:[id number] DOB: April 02, 1946 DOA: 12/20/2017 PCP: Jodi Marble, MD   Brief Narrative:   HPI: Jodi Underwood is a 72 y.o. female with medical history significant of morbid obesity, chronic lymphedema, hypertension, hypothyroidism, CKD stage 3 sleep apnea, on oxygen on baseline who was sent from [facility] skilled nursing facility with complaints of generalized weakness, altered mental status and acute kidney injury.  Patient is a poor historian.  Patient was not really sure why they sent her to the emergency department.  Patient reported not really feeling well since last few days.  She reported that she was not having good urinary output and was very thirsty .  Patient was admitted here and was discharged on 06/16/2017 after management for A. fib with RVR.  She was discharged on warfarin but currently she is not on anticoagulation and heart rate is controlled.  Patient found to have a creatinine of 4.35 on presentation with elevated BUN. She also had Leukocytosis.  She was admitted for acute on chronic kidney disease stage III, urinary tract infection, and sigmoid colitis. Renal Function was not improving much with IVF hydration so Nephrology was consulted and they have stopped BP medications and given fluid boluses and Cr is slowly trending down now. She appears to have volume overload now so started home diuretics, Bumex.    12/26/2017: Patient seen alongside patient's daughter, case management and from ICU rounding team.  Patient saw the doctor was on the phone and the phone was on last pickup.  Patient's records have been reviewed.  Patient has multiple co morbidities.  Patient has baseline chronic kidney disease.  The acute kidney injury is thought to be multifactorial, including volume depletion.  Due to patient's body habitus, patient's volume status is difficult to correlate assess.  Renal wise, patient is also very tenuous.  AKI persists.   No GI symptoms.  However, his CT renal protocol on presentation revealed none specific sigmoid colitis.  Leukocytosis has resolved significantly.  Patient is on IV Zosyn.  Funguria is noted, patient is on anti-fungi.  Disposition was discussed with patient and patient's daughter extensively.  Will await palliative care input.  Likely, patient will be discharged back to skilled nursing facility.   Assessment & Plan:    Principal Problem:   Acute on chronic renal failure (HCC) Active Problems:   Morbid obesity ([place])   Essential hypertension   Asthma   Lymphedema   UTI (urinary tract infection)   Constipation   AKI (acute kidney injury) ([place])   A-fib (HCC)   Chronic systolic CHF (congestive heart failure) ([place])   Encounter for palliative care   Goals of care, counseling/discussion  AKI on CKD stage III, slowly improving  -Her last creatinine was 1.77 on 06/16/2017.   -Baseline creatinine ranges from 1.4-2.3.   -Patient looks dry and dehydrated.  -She was on Bumetanide at the skilled nursing facility which has been held. -Continue to hydrate the patient.  Foley to be inserted for Strict I's/O's. -Acute kidney injury could be from volume depletion versus UTI versus obstructive pathology.   -Checked CT stone study and showed Moderate nonspecific sigmoid colitis, which could be infectious or ischemic in etiology. No evidence of abscess or free fluid. Moderate paraumbilical hernia containing transverse colon and fat. Tiny nonobstructing calculi within the right renal collecting system. No evidence of ureteral calculi or hydronephrosis. -BUN/creatinine went from 66/4.35 and is now 72/3.29 -Obtained a Nephrology consultation as Kidney function not  really improving as they recommending D/Cing Norvasc and Metoprolol and recommended giving Boluses -Patient is not a Dialysis Candidate per Nephrology -Checked urine sodium and was 17, urine creatinine was 142; FENa was 0.4% suggesting Pre-Renal  Causes -Renal U/S showed Negative for age ultrasound appearance of both kidneys. Contracted urinary bladder with mild to moderate nonspecific wall thickening. -IVF rate decreased to 50 mL and now Stopped. Resumed Home Bumex Today -Continue to Monitor renal function. -Guarded renal prognosis. -Palliative Care consulted and continuing current plan and obtaining PT Evaluation   Fungal Urinary Tract Infection -It was reported that patient was been treated with ceftriaxone for urinary tract infection.   -Denies any fever or chills. -U/A showed hemoglobin, large leukocytes, rare bacteria, greater than 50 WBCs and urine culture showed thousand colonies of yeast -Patient was started empirically on IV ceftriaxone will continue for now given her possible sigmoid colitis and we will also start patient on IV fluconazole with pharmacy to dose for the Yeast in her urine -Repeat Urine Culture showed 60,000 CFU of Yeast -C/w IV Diflucan as above and Continue Treatment at least 7-10 Days  Sleep Apnea / Obesity Hypoventilation syndrome/chronic respiratory insufficiency:  -Recently started on oxygen.  -Currently her sats are fine with supplemental oxygen.   -Continue CPAP here and she has Daytime Somnolence -C/w CPAP during the Day  Heart failure with reduced ejection fraction:  -Echocardiogram on 11/18 shows ejection fraction of 50%, hypokinesis of anteroseptal, inferior and inferoseptal myocardium.   -BNP was 202.4 -Patient was on Bumetanide at nursing home and will resume today  -Not on ACE or ARB given Renal Insufficiency    -On Metoprolol Tartrate 25 mg po q12h -Diuresis will be held as patient is hypovolemic. -Patient is +11,285 Liters and Weight was 348 lbs and increased to 370 -Discontinued IVF Hydration at a rate of 50 mL/hr and resumed Home Bumetanide   Morbid Obesity/Lymphedema:  -Patient is debilitated and bedbound due to her morbid obesity and lymphedema.   -She has not ambulated since  November 2018.   -She says she has lost more than 100 pounds in last year. -[place] nurse for Lymphedema -Turn every 2 hours but patient refusing to be turned and refusing Bariatric Bed earlier but agreeable to it now  History of A. Fib -Was discharged on Coumadin as per the records on November 2018.   -Currently not on any anticoagulation.   -EKG showed normal sinus rhythm. -On Amiodarone 200 mg po Daily but Metoprolol 25 mg po q12h Discontinued by Nephrology  Hypothyroidism  -TSH was 17.474 -? Compliance with Synthroid -Checked Free T4 and was 0.84 and T3 was 51 (low) -C/w Levothyroxine 50 mcg po Daily for now and may need to titrate up  Hypertension -Currently blood pressure stable at 145/61 -Her home meds of Amlodipine 5 mg po Daily and Metoprolol have been D/C'd by Nephro. -Continue to Follow BP's   Asthma -Currently stable -Currently not in Exacerbation -C/w Montelukast 10 mg po Daily  -If necessary will add Nebs  Sigmoid Colitis, improving  -Seen on CT Renal Stone Study -Abx of IV Ceftriaxone and IV Metronidazole changed to IV Zosyn -Obtain Blood Cx x2 and showed NGTD at 4 Days -Symptomatic Treatment and Anti-emetics -C/w IVF Rehydration of NS at 50 mL/hr -Continue Clear Liquid Diet for now and Advance to FULL Liquid   -GI pathogen panel by PCR still pending; Having diarrhea and had at least 3 bowel movements  -WBC worsened from 14.7 -> 20.1 -> 21.3 but now  improving on Zosyn and trending down to 12.8 -Continue to Monitor extremely Closely and repeat CBC in AM   Hypomagnesemia -Patient magnesium level this morning was 1.5 and improved to 2.0 -Continue to monitor and replete as necessary -Repeat magnesium level in a.m.  Hyperglycemia in the setting of Prediabetes -Patient's hemoglobin A1c was 6.1 -Add sensitive NovoLog sliding scale insulin before meals -Continue to monitor CBGs; CBG's ranging from 144-154  Normocytic Anemia -Patient's Hb/Hct Stable at  9.5/30.4 -Continue to Monitor for S/Sx of Bleeding  -Repeat CBC in AM   Hyponatremia -Mild at 134 and now improved to 139 -C/w IVF Hydration -Repeat CMP in AM  Hyperphosphatemia,improved   -Patient's Phos Level was 4.8 and now 3.8 -Continue to Monitor and Repeat Phos Level in AM  Somnolence/Confusion/Encephalopathy -Patient thought she was in a car going for a ride yesteday  -Checked ABG and showed 7.217/45.5/60.4/17.8/89% on Room Air -Received Melatonin Last night -Checked Head CT and was Negative  -?Related to OSA and Sleep Apnea with Day time somnolence   DVT prophylaxis: Heparin 5,000 units sq q8h Code Status: FULL CODE Family Communication: No family present at bedside Disposition Plan: Remain Inpatient for current workup and treatment  Consultants:   Nephrology  Palliative Care Medicine    Procedures: None   Antimicrobials:  Anti-infectives (From admission, onward)   Start     Dose/Rate Route Frequency Ordered Stop   12/25/17 1400  piperacillin-tazobactam (ZOSYN) IVPB 3.375 g     3.375 g 12.5 mL/hr over 240 Minutes Intravenous Every 8 hours 12/25/17 1259     12/22/17 1100  piperacillin-tazobactam (ZOSYN) IVPB 2.25 g  Status:  Discontinued     2.25 g 100 mL/hr over 30 Minutes Intravenous Every 8 hours 12/22/17 1003 12/25/17 1259   12/21/17 1800  fluconazole (DIFLUCAN) IVPB 200 mg  Status:  Discontinued     200 mg 100 mL/hr over 60 Minutes Intravenous  Once 12/21/17 1704 12/21/17 1715   12/21/17 1800  fluconazole (DIFLUCAN) IVPB 200 mg     200 mg 100 mL/hr over 60 Minutes Intravenous Every 24 hours 12/21/17 1715     12/20/17 1800  metroNIDAZOLE (FLAGYL) IVPB 500 mg  Status:  Discontinued     500 mg 100 mL/hr over 60 Minutes Intravenous Every 8 hours 12/20/17 1716 12/22/17 0955   12/20/17 1400  cefTRIAXone (ROCEPHIN) 2 g in sodium chloride 0.9 % 100 mL IVPB  Status:  Discontinued     2 g 200 mL/hr over 30 Minutes Intravenous Every 24 hours 12/20/17 1355  12/22/17 0955     Subjective: No new complaints.  No fever or chills.  No shortness of breath or chest pain.  Objective: Vitals:   12/25/17 2207 12/26/17 0524 12/26/17 1100 12/26/17 1429  BP: 139/64 (!) 137/59  (!) 133/59  Pulse: 78 82  78  Resp: 18 18  16   Temp: (!) 97.5 F (36.4 C) (!) 97.3 F (36.3 C)  97.6 F (36.4 C)  TempSrc: Oral   Oral  SpO2: 100% 100%  100%  Weight:   (!) 168.1 kg (370 lb 9.5 oz)   Height:        Intake/Output Summary (Last 24 hours) at 12/26/2017 1435 Last data filed at 12/26/2017 0141 Gross per 24 hour  Intake 150 ml  Output 1580 ml  Net -1430 ml   Filed Weights   12/20/17 1600 12/25/17 0916 12/26/17 1100  Weight: (!) 157.9 kg (348 lb 1.7 oz) (!) 168.2 kg (370 lb 13 oz) (!) 168.1  kg (370 lb 9.5 oz)   Examination: Physical Exam:  Constitutional: Well-nourished, well-developed morbidly obese female who is awake and alert and in no acute distress. Eyes: Sclera anicteric.  Lids normal ENMT: External ears and nose appear normal.  Grossly normal hearing Neck: Appears supple with no appreciable JVD however this is difficult to assess secondary body habitus Respiratory: Diminished to auscultation bilaterally.  Patient is unlabored breathing and had no appreciable wheezing, rales, rhonchi.  Mildly tachypneic Cardiovascular: Them.  No appreciable murmurs, rubs, gallops with chronic lymphedema changes and slightly more swelling today. Abdomen: Soft, nontender to palpate.  Distended secondary body habitus.  Bowel sounds present x4 GU: Has a Foley catheter in place Musculoskeletal: No contractures cyanosis noted. Skin: Skin is warm and dry and patient has chronic lymphedema changes in the lower extremities.  Left leg is more swollen than right Neurologic: Cranial nerves II through XII grossly intact with no appreciable focal deficits Psychiatric: Wake and alert.  Was not as sleepy as yesterday.  Slightly depressed mood and affect  Data Reviewed: I have  personally reviewed following labs and imaging studies  CBC: Recent Labs  Lab 12/21/17 0845 12/22/17 0420 12/23/17 0402 12/24/17 0427 12/25/17 0447  WBC 20.1* 21.3* 17.0* 16.1* 12.8*  NEUTROABS 14.4 16.0* 13.4* 12.7* 9.8*  HGB 9.8* 10.2* 10.0* 9.6* 9.5*  HCT 31.8* 33.2* 32.5* 31.0* 30.4*  MCV 86.2 86.0 85.8 85.9 86.6  PLT 294 287 266 286 902   Basic Metabolic Panel: Recent Labs  Lab 12/21/17 0415 12/22/17 0420 12/23/17 0402 12/24/17 0427 12/25/17 0447  NA 135 136 134* 137 139  K 4.8 4.7 4.3 4.0 3.9  CL 99* 101 103 105 109  CO2 24 24 20* 20* 20*  GLUCOSE 157* 127* 130* 138* 141*  BUN 70* 78* 77* 79* 72*  CREATININE 4.22* 4.20* 4.07* 3.76* 3.29*  CALCIUM 8.1* 8.1* 7.9* 8.0* 7.8*  MG 1.5* 2.1 2.1 2.1 2.0  PHOS 3.5 4.5 4.8* 4.5 3.8   GFR: Estimated Creatinine Clearance: 25.1 mL/min (A) (by C-G formula based on SCr of 3.29 mg/dL (H)). Liver Function Tests: Recent Labs  Lab 12/21/17 0415 12/22/17 0420 12/23/17 0402 12/24/17 0427 12/25/17 0447  AST 14* 15 13* 15 18  ALT 10* 11* 13* 11* 11*  ALKPHOS 73 92 90 134* 157*  BILITOT 1.0 0.6 0.6 0.8 0.7  PROT 5.9* 5.9* 5.8* 5.8* 5.6*  ALBUMIN 2.4* 2.2* 2.1* 2.2* 2.0*   No results for input(s): LIPASE, AMYLASE in the last 168 hours. Recent Labs  Lab 12/20/17 1140  AMMONIA 16   Coagulation Profile: No results for input(s): INR, PROTIME in the last 168 hours. Cardiac Enzymes: No results for input(s): CKTOTAL, CKMB, CKMBINDEX, TROPONINI in the last 168 hours. BNP (last 3 results) No results for input(s): PROBNP in the last 8760 hours. HbA1C: No results for input(s): HGBA1C in the last 72 hours. CBG: Recent Labs  Lab 12/25/17 1144 12/25/17 1731 12/25/17 2213 12/26/17 0826 12/26/17 1426  GLUCAP 154* 144* 116* 109* 147*   Lipid Profile: No results for input(s): CHOL, HDL, LDLCALC, TRIG, CHOLHDL, LDLDIRECT in the last 72 hours. Thyroid Function Tests: No results for input(s): TSH, T4TOTAL, FREET4, T3FREE,  THYROIDAB in the last 72 hours. Anemia Panel: No results for input(s): VITAMINB12, FOLATE, FERRITIN, TIBC, IRON, RETICCTPCT in the last 72 hours. Sepsis Labs: Recent Labs  Lab 12/20/17 1147  LATICACIDVEN 1.53    Recent Results (from the past 240 hour(s))  Culture, Urine     Status: Abnormal  Collection Time: 12/20/17  2:19 PM  Result Value Ref Range Status   Specimen Description   Final    URINE, RANDOM Performed at [facility] [address], [place], [place] [address]    Special Requests   Final    NONE Performed at [facility], [place] [address] [place], [place] [address]    Culture 40,000 COLONIES/mL YEAST (A)  Final   Report Status 12/21/2017 FINAL  Final  Culture, blood (routine x 2)     Status: None   Collection Time: 12/21/17  4:41 PM  Result Value Ref Range Status   Specimen Description   Final    BLOOD LEFT HAND Performed at [facility] [address], [place], [place] [address]    Special Requests   Final    BOTTLES DRAWN AEROBIC ONLY Blood Culture adequate volume Performed at [facility] [address] [place], [place] [address]    Culture   Final    NO GROWTH 5 DAYS Performed at [facility], [place] [address], [place], [place] [address]    Report Status 12/26/2017 FINAL  Final  Culture, blood (routine x 2)     Status: None   Collection Time: 12/21/17  4:43 PM  Result Value Ref Range Status   Specimen Description   Final    BLOOD RIGHT ANTECUBITAL Performed at [facility] [address], [place], [place] [address]    Special Requests   Final    BOTTLES DRAWN AEROBIC AND ANAEROBIC Blood Culture adequate volume Performed at [facility] [address] [place], [place] [address]    Culture   Final    NO GROWTH 5 DAYS Performed at [facility], [place] [address] [place], [place] [address]    Report Status 12/26/2017 FINAL   Final  Culture, Urine     Status: Abnormal   Collection Time: 12/21/17  5:43 PM  Result Value Ref Range Status   Specimen Description   Final    URINE, RANDOM Performed at [facility] [address] [place], [place] [address]    Special Requests   Final    NONE Performed at [facility], [place] [address] [place], [place] [address]    Culture 60,000 COLONIES/mL YEAST (A)  Final   Report Status 12/22/2017 FINAL  Final  Gastrointestinal Panel by PCR , Stool     Status: None   Collection Time: 12/22/17  3:59 AM  Result Value Ref Range Status   Campylobacter species NOT DETECTED NOT DETECTED Final   Plesimonas shigelloides NOT DETECTED NOT DETECTED Final   Salmonella species NOT DETECTED NOT DETECTED Final   Yersinia enterocolitica NOT DETECTED NOT DETECTED Final   Vibrio species NOT DETECTED NOT DETECTED Final   Vibrio cholerae NOT DETECTED NOT DETECTED Final   Enteroaggregative E coli (EAEC) NOT DETECTED NOT DETECTED Final   Enteropathogenic E coli (EPEC) NOT DETECTED NOT DETECTED Final   Enterotoxigenic E coli (ETEC) NOT DETECTED NOT DETECTED Final   Shiga like toxin producing E coli (STEC) NOT DETECTED NOT DETECTED Final   Shigella/Enteroinvasive E coli (EIEC) NOT DETECTED NOT DETECTED Final   Cryptosporidium NOT DETECTED NOT DETECTED Final   Cyclospora cayetanensis NOT DETECTED NOT DETECTED Final   Entamoeba histolytica NOT DETECTED NOT DETECTED Final   Giardia lamblia NOT DETECTED NOT DETECTED Final   Adenovirus F40/41 NOT DETECTED NOT DETECTED Final   Astrovirus NOT  DETECTED NOT DETECTED Final   Norovirus GI/GII NOT DETECTED NOT DETECTED Final   Rotavirus A NOT DETECTED NOT DETECTED Final   Sapovirus (I, II, IV, and V) NOT DETECTED NOT DETECTED Final    Comment: Performed at [facility], [address] [place], [place] [address]     Radiology Studies: Dg Chest Port 1 View  Result Date: 12/25/2017 CLINICAL DATA:  Short of  breath EXAM: PORTABLE CHEST 1 VIEW COMPARISON:  12/20/2017 FINDINGS: Suboptimal image due to large patient size and rotation. Mild bibasilar airspace disease.  No definite edema or effusion IMPRESSION: Suboptimal image. Recommend repeating. Two-view chest x-ray is suggested if possible. Mild bibasilar atelectasis/infiltrate. Electronically Signed   By: Franchot Gallo M.D.   On: 12/25/2017 08:51   Scheduled Meds: . amiodarone  200 mg Oral Daily  . bumetanide  2 mg Oral Daily  . guaiFENesin  600 mg Oral Q12H  . heparin  5,000 Units Subcutaneous Q8H  . insulin aspart  0-9 Units Subcutaneous TID WC  . levothyroxine  50 mcg Oral QAC breakfast  . Melatonin  3 mg Oral QHS  . montelukast  10 mg Oral Daily  . protein supplement shake  11 oz Oral BID BM  . sodium chloride flush  10-40 mL Intracatheter Q12H   Continuous Infusions: . fluconazole (DIFLUCAN) IV Stopped (12/25/17 2030)  . piperacillin-tazobactam (ZOSYN)  IV Stopped (12/26/17 0930)    LOS: 6 days   Bonnell Public, MD [facility] Pager [phone]  If 7PM-7AM, please contact night-coverage [email or web address] Password [facility] 12/26/2017, 2:35 PM

## 2017-12-26 NOTE — Progress Notes (Signed)
OT Cancellation Note  Patient Details Name: Jodi Underwood MRN: [id number] DOB: 1946-03-08   Cancelled Treatment:    Reason Eval/Treat Not Completed: Other (comment)  Noted pt from a SNF and going back to SNF.  Will defer OT needs to SNF  [facility], Mickel Baas, [place] 12/26/2017, 2:16 PM

## 2017-12-27 LAB — RENAL FUNCTION PANEL
Albumin: 2 g/dL — ABNORMAL LOW (ref 3.5–5.0)
Anion gap: 10 (ref 5–15)
BUN: 56 mg/dL — ABNORMAL HIGH (ref 6–20)
CO2: 22 mmol/L (ref 22–32)
Calcium: 7.8 mg/dL — ABNORMAL LOW (ref 8.9–10.3)
Chloride: 110 mmol/L (ref 101–111)
Creatinine, Ser: 3.02 mg/dL — ABNORMAL HIGH (ref 0.44–1.00)
GFR calc Af Amer: 17 mL/min — ABNORMAL LOW (ref >60)
GFR calc non Af Amer: 14 mL/min — ABNORMAL LOW (ref >60)
Glucose, Bld: 125 mg/dL — ABNORMAL HIGH (ref 65–99)
Phosphorus: 3.1 mg/dL (ref 2.5–4.6)
Potassium: 3.8 mmol/L (ref 3.5–5.1)
Sodium: 142 mmol/L (ref 135–145)

## 2017-12-27 LAB — GLUCOSE, CAPILLARY
GLUCOSE-CAPILLARY: 126 mg/dL — AB (ref 65–99)
GLUCOSE-CAPILLARY: 120 mg/dL — AB (ref 65–99)
GLUCOSE-CAPILLARY: 130 mg/dL — AB (ref 65–99)
GLUCOSE-CAPILLARY: 141 mg/dL — AB (ref 65–99)

## 2017-12-27 MED ORDER — FLUCONAZOLE 100 MG PO TABS
100.0000 mg | ORAL_TABLET | Freq: Every day | ORAL | Status: DC
Start: 2017-12-27 — End: 2017-12-28
  Administered 2017-12-27 – 2017-12-28 (×2): 100 mg via ORAL
  Filled 2017-12-27 (×2): qty 1

## 2017-12-27 NOTE — Progress Notes (Signed)
Pt. placed on CPAP for h/s, tolerating well, humidifier refilled s/w, placed 3 lpm oxygen into circuit, tolerating well.

## 2017-12-27 NOTE — Progress Notes (Signed)
   12/27/17 1000  Clinical Encounter Type  Visited With Patient not available  Visit Type Initial;Psychological support;Spiritual support  Referral From Nurse  Consult/Referral To Chaplain  Spiritual Encounters  Spiritual Needs Other (Comment) (Advance Directive)  Stress Factors  Patient Stress Factors Not reviewed   A Spiritual Care consult was placed for the patient stating that she wanted information on Advance Directives.  The patient was asleep at the time of my visit. I left the Advance Directive information on her bedside table.  Will follow up with the patient at a later time.   Please, contact Spiritual Care for further assistance.   Chaplain Shanon Ace M.Div., [facility]

## 2017-12-27 NOTE — NC FL2 (Signed)
[place] LEVEL OF CARE SCREENING TOOL     IDENTIFICATION  Patient Name: Jodi Underwood Birthdate: 1945-10-04 Sex: female Admission Date (Current Location): 12/20/2017  [facility] and [place] Number:  Herbalist and Address:  [facility],  [place], [place]      Provider Number: [id number]  Attending Physician Name and Address:  Alma Friendly, MD  Relative Name and Phone Number:       Current Level of Care: Hospital Recommended Level of Care: [place] Prior Approval Number:    Date Approved/Denied:   PASRR Number: [id number]  Discharge Plan: SNF    Current Diagnoses: Patient Active Problem List   Diagnosis Date Noted  . Encounter for palliative care   . Goals of care, counseling/discussion   . Chronic systolic CHF (congestive heart failure) ([place]) 12/20/2017  . A-fib ([place]) 06/11/2017  . Cellulitis, leg 10/13/2016  . AKI (acute kidney injury) ([place]) 10/13/2016  . Hypertension   . Lymphedema of both lower extremities   . Abnormal CT of the abdomen   . Pressure injury of skin 04/28/2016  . Intractable lower abdominal pain 04/27/2016  . Constipation   . Respiratory distress   . Acute cystitis without hematuria   . Sepsis ([place]) 09/26/2014  . UTI (urinary tract infection) 09/26/2014  . Asthma 09/26/2014  . Asthma with acute exacerbation   . Blood poisoning   . Urinary tract infectious disease   . Diabetes type 2, uncontrolled ([place])   . Acute on chronic renal failure ([place])   . Essential hypertension, benign   . Acute renal failure ([place]) 01/25/2013  . Cellulitis of leg, bilateral 01/23/2013  . Ulcers of both lower extremities ([place]) 01/23/2013  . Lymphedema 06/13/2011  . DM type 2, uncontrolled, with neuropathy ([place]) 06/23/2010  . Morbid obesity ([place]) 06/23/2010  . ANEMIA-NOS 06/23/2010  . Essential hypertension 06/23/2010  . Asthma 06/23/2010    Orientation RESPIRATION BLADDER Height  & Weight     Self  O2(CPAP Medium Adult nasal mask) Indwelling catheter Weight: (!) 363 lb 5.1 oz (164.8 kg) Height:  5\' 6"  (167.6 cm)  BEHAVIORAL SYMPTOMS/MOOD NEUROLOGICAL BOWEL NUTRITION STATUS      Incontinent Diet(see dc summary)  AMBULATORY STATUS COMMUNICATION OF NEEDS Skin   Total Care Verbally Other (Comment)(MASD (back;buttocks) barrier cream)                       Personal Care Assistance Level of Assistance  Bathing, Feeding, Dressing Bathing Assistance: Maximum assistance Feeding assistance: Limited assistance Dressing Assistance: Maximum assistance     Functional Limitations Info  Sight, Hearing, Speech Sight Info: Adequate Hearing Info: Impaired Speech Info: Impaired(Slurred speech)    SPECIAL CARE FACTORS FREQUENCY  PT (By licensed PT), OT (By licensed OT)     PT Frequency: 5x/week OT Frequency: 5x/week            Contractures      Additional Factors Info  Code Status, Allergies Code Status Info: Full code Allergies Info: Ciprofloxacin           Current Medications (12/27/2017):  This is the current hospital active medication list Current Facility-Administered Medications  Medication Dose Route Frequency Provider Last Rate Last Dose  . amiodarone (PACERONE) tablet 200 mg  200 mg Oral Daily Shelly Coss, MD   200 mg at 12/27/17 0954  . bumetanide (BUMEX) tablet 2 mg  2 mg Oral Daily [place], Georgina Quint [place], DO   2  mg at 12/27/17 0954  . fluconazole (DIFLUCAN) tablet 100 mg  100 mg Oral Daily Alma Friendly, MD   100 mg at 12/27/17 1402  . guaiFENesin (MUCINEX) 12 hr tablet 600 mg  600 mg Oral Q12H Adhikari, Amrit, MD   600 mg at 12/27/17 0954  . heparin injection 5,000 Units  5,000 Units Subcutaneous Q8H Shelly Coss, MD   5,000 Units at 12/27/17 1359  . hydrALAZINE (APRESOLINE) injection 10-20 mg  10-20 mg Intravenous Q4H PRN Roney Jaffe, MD      . insulin aspart (novoLOG) injection 0-9 Units  0-9 Units Subcutaneous TID WC Raiford Noble [place], DO   1 Units at 12/27/17 0753  . levothyroxine (SYNTHROID, LEVOTHROID) tablet 50 mcg  50 mcg Oral QAC breakfast Shelly Coss, MD   50 mcg at 12/27/17 0753  . Melatonin TABS 3 mg  3 mg Oral QHS Shelly Coss, MD   3 mg at 12/26/17 2154  . menthol-cetylpyridinium (CEPACOL) lozenge 3 mg  1 lozenge Oral PRN Sheikh, Omair Latif, DO      . montelukast (SINGULAIR) tablet 10 mg  10 mg Oral Daily Shelly Coss, MD   10 mg at 12/27/17 0954  . ondansetron (ZOFRAN) injection 4 mg  4 mg Intravenous Q6H PRN Raiford Noble Gause, DO   4 mg at 12/25/17 2036   Or  . ondansetron (ZOFRAN-ODT) disintegrating tablet 4 mg  4 mg Oral Q6H PRN Sheikh, Omair Latif, DO      . piperacillin-tazobactam (ZOSYN) IVPB 3.375 g  3.375 g Intravenous Q8H Bonnell Public, MD   Stopped at 12/27/17 1306  . protein supplement (PREMIER PROTEIN) liquid  11 oz Oral BID BM Dana Allan I, MD   11 oz at 12/27/17 1359  . sodium chloride flush (NS) 0.9 % injection 10-40 mL  10-40 mL Intracatheter Q12H Sheikh, Georgina Quint [employer], DO   10 mL at 12/26/17 2155  . sodium chloride flush (NS) 0.9 % injection 10-40 mL  10-40 mL Intracatheter PRN Kerney Elbe, DO         Discharge Medications: Please see discharge summary for a list of discharge medications.  Relevant Imaging Results:  Relevant Lab Results:   Additional Information SS# [id number], LCSW

## 2017-12-27 NOTE — Progress Notes (Signed)
PROGRESS NOTE    Jodi Underwood  BTD:[id number] DOB: 04-10-1946 DOA: 12/20/2017 PCP: Jodi Marble, MD   Brief Narrative:   HPI: Jodi Underwood is a 72 y.o. female with medical history significant of morbid obesity, chronic lymphedema, hypertension, hypothyroidism, CKD stage 3 sleep apnea, on oxygen on baseline who was sent from [facility] skilled nursing facility with complaints of generalized weakness, altered mental status and acute kidney injury.  Patient is a poor historian. Patient was admitted here and was discharged on 06/16/2017 after management for A. fib with RVR.  She was discharged on warfarin but currently she is not on anticoagulation. Patient found to have a creatinine of 4.35 on presentation with elevated BUN. She also had Leukocytosis.  She was admitted for acute on chronic kidney disease stage III, urinary tract infection, and sigmoid colitis. Renal Function was not improving much with IVF hydration so Nephrology was consulted and they have stopped BP medications and given fluid boluses and Cr is slowly trending down now. She appears to have volume overload now so started home diuretics, Bumex.    Today, pt reported feeling ok, noted to be sleeping, but easily arousable. Denies any new symptoms.   Assessment & Plan:    Principal Problem:   Acute on chronic renal failure (HCC) Active Problems:   Morbid obesity ([place])   Essential hypertension   Asthma   Lymphedema   UTI (urinary tract infection)   Constipation   AKI (acute kidney injury) ([place])   A-fib (HCC)   Chronic systolic CHF (congestive heart failure) ([place])   Encounter for palliative care   Goals of care, counseling/discussion  AKI on CKD stage III, slowly improving  Baseline creatinine ranges from 1.4-2.3.   Acute kidney injury could be from volume depletion versus UTI versus obstructive pathology.   Checked CT stone study and showed Moderate nonspecific sigmoid colitis, which could be  infectious or ischemic in etiology. No evidence of abscess or free fluid. Moderate paraumbilical hernia containing transverse colon and fat. Tiny nonobstructing calculi within the right renal collecting system. No evidence of ureteral calculi or hydronephrosis Nephrology consulted: recommending D/Cing Norvasc, Metoprolol, hydration. Patient is not a Dialysis Candidate per Nephrology Renal U/S showed Negative for age ultrasound appearance of both kidneys. Contracted urinary bladder with mild to moderate nonspecific wall thickening Daily BMP Palliative Care consulted   Fungal Urinary Tract Infection Afebrile with resolving leukocytosis U/A showed hemoglobin, large leukocytes, rare bacteria, greater than 50 WBCs and urine culture showed thousand colonies of yeast Patient was started empirically on IV ceftriaxone will continue for now given her possible sigmoid colitis and we will also start patient on IV fluconazole with pharmacy to dose for the Yeast in her urine Repeat Urine Culture showed 60,000 CFU of Yeast C/w IV Diflucan as above and Continue Treatment at least 7-10 Days  Sleep Apnea / Obesity Hypoventilation syndrome/chronic respiratory insufficiency:  Recently started on oxygen, saturating well CPAP during the Day  Heart failure with reduced ejection fraction:  Echocardiogram on 11/18 shows ejection fraction of 50%, hypokinesis of anteroseptal, inferior and inferoseptal myocardium.   BNP was 202.4 Continue bumex. Not on ACE or ARB given Renal Insufficiency     Morbid Obesity/Lymphedema:  Patient is debilitated and bedbound due to her morbid obesity and lymphedema.   [place] nurse for Lymphedema  History of A. Fib Was discharged on Coumadin as per the records on November 2018.   Currently not on any anticoagulation.   EKG showed normal sinus rhythm.  Continue Amiodarone 200 mg po Daily  Hypothyroidism  TSH was 17.474 ??Compliance with Synthroid Free T4 and was 0.84 and T3 was  51 (low) C/w Levothyroxine 50 mcg po Daily for now and may need to titrate up  Hypertension Stable Home meds of Amlodipine 5 mg po Daily and Metoprolol have been D/C'd by Nephro. Monitor closely  Asthma Currently stable C/w Montelukast 10 mg po Daily  -If necessary will add Nebs  Sigmoid Colitis, improving  Seen on CT Renal Stone Study Abx of IV Ceftriaxone and IV Metronidazole changed to IV Zosyn Blood Cx x2 showed NGTD Monitor closely   Hyperglycemia in the setting of Prediabetes -Patient's hemoglobin A1c was 6.1 -Add sensitive NovoLog sliding scale insulin before meals -Continue to monitor CBGs; CBG's ranging from 144-154  Normocytic Anemia -Patient's Hb/Hct Stable -Continue to Monitor for S/Sx of Bleeding  -Repeat CBC in AM   Somnolence/Confusion/Encephalopathy Improving CT head neg  ?Related to OSA and Sleep Apnea with Day time somnolence  Monitor closely     DVT prophylaxis: Heparin 5,000 units sq q8h Code Status: FULL CODE Family Communication: No family present at bedside Disposition Plan: Remain Inpatient for current workup and treatment  Consultants:   Nephrology  Palliative Care Medicine    Procedures: None   Antimicrobials:  Anti-infectives (From admission, onward)   Start     Dose/Rate Route Frequency Ordered Stop   12/27/17 1400  fluconazole (DIFLUCAN) tablet 100 mg     100 mg Oral Daily 12/27/17 1059 01/04/18 0959   12/26/17 2200  piperacillin-tazobactam (ZOSYN) IVPB 2.25 g  Status:  Discontinued     2.25 g 100 mL/hr over 30 Minutes Intravenous Every 8 hours 12/26/17 1444 12/26/17 1453   12/26/17 1600  piperacillin-tazobactam (ZOSYN) IVPB 3.375 g     3.375 g 12.5 mL/hr over 240 Minutes Intravenous Every 8 hours 12/26/17 1454 12/28/17 0759   12/25/17 1400  piperacillin-tazobactam (ZOSYN) IVPB 3.375 g  Status:  Discontinued     3.375 g 12.5 mL/hr over 240 Minutes Intravenous Every 8 hours 12/25/17 1259 12/26/17 1444   12/22/17 1100   piperacillin-tazobactam (ZOSYN) IVPB 2.25 g  Status:  Discontinued     2.25 g 100 mL/hr over 30 Minutes Intravenous Every 8 hours 12/22/17 1003 12/25/17 1259   12/21/17 1800  fluconazole (DIFLUCAN) IVPB 200 mg  Status:  Discontinued     200 mg 100 mL/hr over 60 Minutes Intravenous  Once 12/21/17 1704 12/21/17 1715   12/21/17 1800  fluconazole (DIFLUCAN) IVPB 200 mg  Status:  Discontinued     200 mg 100 mL/hr over 60 Minutes Intravenous Every 24 hours 12/21/17 1715 12/27/17 1059   12/20/17 1800  metroNIDAZOLE (FLAGYL) IVPB 500 mg  Status:  Discontinued     500 mg 100 mL/hr over 60 Minutes Intravenous Every 8 hours 12/20/17 1716 12/22/17 0955   12/20/17 1400  cefTRIAXone (ROCEPHIN) 2 g in sodium chloride 0.9 % 100 mL IVPB  Status:  Discontinued     2 g 200 mL/hr over 30 Minutes Intravenous Every 24 hours 12/20/17 1355 12/22/17 0955     Subjective: Today, pt reported feeling ok, noted to be sleeping, but easily arousable. Denies any new symptoms.   Objective: Vitals:   12/26/17 1429 12/26/17 2208 12/27/17 0534 12/27/17 1519  BP: (!) 133/59 134/74 (!) 145/62 140/62  Pulse: 78 74 82 83  Resp: 16 16 18 17   Temp: 97.6 F (36.4 C) 97.7 F (36.5 C) 98 F (36.7 C) (!) 97.5 F (36.4  C)  TempSrc: Oral Oral Oral Oral  SpO2: 100% 100% 100% 97%  Weight:   (!) 164.8 kg (363 lb 5.1 oz)   Height:        Intake/Output Summary (Last 24 hours) at 12/27/2017 1635 Last data filed at 12/27/2017 1400 Gross per 24 hour  Intake 626.87 ml  Output 2000 ml  Net -1373.13 ml   Filed Weights   12/25/17 0916 12/26/17 1100 12/27/17 0534  Weight: (!) 168.2 kg (370 lb 13 oz) (!) 168.1 kg (370 lb 9.5 oz) (!) 164.8 kg (363 lb 5.1 oz)   Examination: Physical Exam:  Constitutional: Well-nourished, well-developed morbidly obese female who is awake and alert and in no acute distress. Eyes: Sclera anicteric.  Lids normal ENMT: External ears and nose appear normal.  Grossly normal hearing Neck: Appears supple  with no appreciable JVD however this is difficult to assess secondary body habitus Respiratory: Diminished to auscultation bilaterally.  Patient is unlabored breathing and had no appreciable wheezing, rales, rhonchi.  Mildly tachypneic Cardiovascular: Them.  No appreciable murmurs, rubs, gallops with chronic lymphedema changes and slightly more swelling today. Abdomen: Soft, nontender to palpate.  Distended secondary body habitus.  Bowel sounds present x4 GU: Has a Foley catheter in place Musculoskeletal: No contractures cyanosis noted. Skin: Skin is warm and dry and patient has chronic lymphedema changes in the lower extremities.  Left leg is more swollen than right Neurologic: Cranial nerves II through XII grossly intact with no appreciable focal deficits Psychiatric: Awake and alert  Data Reviewed: I have personally reviewed following labs and imaging studies  CBC: Recent Labs  Lab 12/21/17 0845 12/22/17 0420 12/23/17 0402 12/24/17 0427 12/25/17 0447  WBC 20.1* 21.3* 17.0* 16.1* 12.8*  NEUTROABS 14.4 16.0* 13.4* 12.7* 9.8*  HGB 9.8* 10.2* 10.0* 9.6* 9.5*  HCT 31.8* 33.2* 32.5* 31.0* 30.4*  MCV 86.2 86.0 85.8 85.9 86.6  PLT 294 287 266 286 782   Basic Metabolic Panel: Recent Labs  Lab 12/21/17 0415 12/22/17 0420 12/23/17 0402 12/24/17 0427 12/25/17 0447 12/27/17 0432  NA 135 136 134* 137 139 142  K 4.8 4.7 4.3 4.0 3.9 3.8  CL 99* 101 103 105 109 110  CO2 24 24 20* 20* 20* 22  GLUCOSE 157* 127* 130* 138* 141* 125*  BUN 70* 78* 77* 79* 72* 56*  CREATININE 4.22* 4.20* 4.07* 3.76* 3.29* 3.02*  CALCIUM 8.1* 8.1* 7.9* 8.0* 7.8* 7.8*  MG 1.5* 2.1 2.1 2.1 2.0  --   PHOS 3.5 4.5 4.8* 4.5 3.8 3.1   GFR: Estimated Creatinine Clearance: 27 mL/min (A) (by C-G formula based on SCr of 3.02 mg/dL (H)). Liver Function Tests: Recent Labs  Lab 12/21/17 0415 12/22/17 0420 12/23/17 0402 12/24/17 0427 12/25/17 0447 12/27/17 0432  AST 14* 15 13* 15 18  --   ALT 10* 11* 13* 11*  11*  --   ALKPHOS 73 92 90 134* 157*  --   BILITOT 1.0 0.6 0.6 0.8 0.7  --   PROT 5.9* 5.9* 5.8* 5.8* 5.6*  --   ALBUMIN 2.4* 2.2* 2.1* 2.2* 2.0* 2.0*   No results for input(s): LIPASE, AMYLASE in the last 168 hours. No results for input(s): AMMONIA in the last 168 hours. Coagulation Profile: No results for input(s): INR, PROTIME in the last 168 hours. Cardiac Enzymes: No results for input(s): CKTOTAL, CKMB, CKMBINDEX, TROPONINI in the last 168 hours. BNP (last 3 results) No results for input(s): PROBNP in the last 8760 hours. HbA1C: No results for input(s):  HGBA1C in the last 72 hours. CBG: Recent Labs  Lab 12/26/17 1426 12/26/17 1653 12/26/17 2157 12/27/17 0735 12/27/17 1139  GLUCAP 147* 122* 113* 126* 120*   Lipid Profile: No results for input(s): CHOL, HDL, LDLCALC, TRIG, CHOLHDL, LDLDIRECT in the last 72 hours. Thyroid Function Tests: No results for input(s): TSH, T4TOTAL, FREET4, T3FREE, THYROIDAB in the last 72 hours. Anemia Panel: No results for input(s): VITAMINB12, FOLATE, FERRITIN, TIBC, IRON, RETICCTPCT in the last 72 hours. Sepsis Labs: No results for input(s): PROCALCITON, LATICACIDVEN in the last 168 hours.  Recent Results (from the past 240 hour(s))  Culture, Urine     Status: Abnormal   Collection Time: 12/20/17  2:19 PM  Result Value Ref Range Status   Specimen Description   Final    URINE, RANDOM Performed at [facility] [address], [place], [place] [address]    Special Requests   Final    NONE Performed at [facility], [place] [address], [place], [place] [address]    Culture 40,000 COLONIES/mL YEAST (A)  Final   Report Status 12/21/2017 FINAL  Final  Culture, blood (routine x 2)     Status: None   Collection Time: 12/21/17  4:41 PM  Result Value Ref Range Status   Specimen Description   Final    BLOOD LEFT HAND Performed at [facility] [address] [place], [place] [address]      Special Requests   Final    BOTTLES DRAWN AEROBIC ONLY Blood Culture adequate volume Performed at [facility] [address] [place], [place] [address]    Culture   Final    NO GROWTH 5 DAYS Performed at [facility], [place] [address], [place], [place] [address]    Report Status 12/26/2017 FINAL  Final  Culture, blood (routine x 2)     Status: None   Collection Time: 12/21/17  4:43 PM  Result Value Ref Range Status   Specimen Description   Final    BLOOD RIGHT ANTECUBITAL Performed at [facility] [address], [place], [place] [address]    Special Requests   Final    BOTTLES DRAWN AEROBIC AND ANAEROBIC Blood Culture adequate volume Performed at [facility] [address] [place], [place] [address]    Culture   Final    NO GROWTH 5 DAYS Performed at [facility], [place] [address] [place], [place] [address]    Report Status 12/26/2017 FINAL  Final  Culture, Urine     Status: Abnormal   Collection Time: 12/21/17  5:43 PM  Result Value Ref Range Status   Specimen Description   Final    URINE, RANDOM Performed at [facility] [address], [place], [place] [address]    Special Requests   Final    NONE Performed at [facility], [place] [address], [place], [place] [address]    Culture 60,000 COLONIES/mL YEAST (A)  Final   Report Status 12/22/2017 FINAL  Final  Gastrointestinal Panel by PCR , Stool     Status: None   Collection Time: 12/22/17  3:59 AM  Result Value Ref Range Status   Campylobacter species NOT DETECTED NOT DETECTED Final   Plesimonas shigelloides NOT DETECTED NOT DETECTED Final   Salmonella species NOT DETECTED NOT DETECTED Final   Yersinia enterocolitica NOT DETECTED NOT DETECTED Final   Vibrio species NOT DETECTED NOT DETECTED Final  Vibrio cholerae NOT DETECTED NOT DETECTED Final   Enteroaggregative E coli (EAEC) NOT DETECTED NOT DETECTED  Final   Enteropathogenic E coli (EPEC) NOT DETECTED NOT DETECTED Final   Enterotoxigenic E coli (ETEC) NOT DETECTED NOT DETECTED Final   Shiga like toxin producing E coli (STEC) NOT DETECTED NOT DETECTED Final   Shigella/Enteroinvasive E coli (EIEC) NOT DETECTED NOT DETECTED Final   Cryptosporidium NOT DETECTED NOT DETECTED Final   Cyclospora cayetanensis NOT DETECTED NOT DETECTED Final   Entamoeba histolytica NOT DETECTED NOT DETECTED Final   Giardia lamblia NOT DETECTED NOT DETECTED Final   Adenovirus F40/41 NOT DETECTED NOT DETECTED Final   Astrovirus NOT DETECTED NOT DETECTED Final   Norovirus GI/GII NOT DETECTED NOT DETECTED Final   Rotavirus A NOT DETECTED NOT DETECTED Final   Sapovirus (I, II, IV, and V) NOT DETECTED NOT DETECTED Final    Comment: Performed at [facility], [address] [place], [place] [address]     Radiology Studies: No results found. Scheduled Meds: . amiodarone  200 mg Oral Daily  . bumetanide  2 mg Oral Daily  . fluconazole  100 mg Oral Daily  . guaiFENesin  600 mg Oral Q12H  . heparin  5,000 Units Subcutaneous Q8H  . insulin aspart  0-9 Units Subcutaneous TID WC  . levothyroxine  50 mcg Oral QAC breakfast  . Melatonin  3 mg Oral QHS  . montelukast  10 mg Oral Daily  . protein supplement shake  11 oz Oral BID BM  . sodium chloride flush  10-40 mL Intracatheter Q12H   Continuous Infusions: . piperacillin-tazobactam (ZOSYN)  IV Stopped (12/27/17 1306)    LOS: 7 days   Alma Friendly, MD [facility]   If 7PM-7AM, please contact night-coverage [email or web address] Password [facility] 12/27/2017, 4:35 PM

## 2017-12-27 NOTE — Progress Notes (Signed)
Pharmacy Antibiotic Note  Jodi Underwood is a 72 y.o. female admitted on 12/20/2017 with sigmoid colitis. Previously being treated for UTI at facility with Rocephin; now found to have candiduria.  Pharmacy has been consulted for fluconazole dosing, pt also with intra-abdominal infection per which pharmacy is doing zosyn.  12/27/2017   D7 abx  Scr 3.02, CrCl ~25-21ml/min  Leukocytosis improving- WBC 12.8  afebrile  Plan:  Change Diflucan to 100mg  po daily to complete 14 day course  Continue Zosyn to 3.375mg  IV q8h extended interval-completes 7 day course today  No dose adjustments anticipated- pharmacy to sign off.  Please re-consult if needed  Height: 5\' 6"  (167.6 cm) Weight: (!) 363 lb 5.1 oz (164.8 kg) IBW/kg (Calculated) : 59.3  Temp (24hrs), Avg:97.8 F (36.6 C), Min:97.6 F (36.4 C), Max:98 F (36.7 C)  Recent Labs  Lab 12/20/17 1147  12/21/17 0845 12/22/17 0420 12/23/17 0402 12/24/17 0427 12/25/17 0447 12/27/17 0432  WBC  --   --  20.1* 21.3* 17.0* 16.1* 12.8*  --   CREATININE  --    < >  --  4.20* 4.07* 3.76* 3.29* 3.02*  LATICACIDVEN 1.53  --   --   --   --   --   --   --    < > = values in this interval not displayed.    Estimated Creatinine Clearance: 27 mL/min (A) (by C-G formula based on SCr of 3.02 mg/dL (H)).    Allergies  Allergen Reactions  . Ciprofloxacin Other (See Comments)    Hallucinations     Thank you for allowing pharmacy to be a part of this patient's care.  Netta Cedars, PharmD, BCPS Pager: [phone] 12/27/2017, 11:00 AM

## 2017-12-28 LAB — GLUCOSE, CAPILLARY
GLUCOSE-CAPILLARY: 124 mg/dL — AB (ref 65–99)
Glucose-Capillary: 125 mg/dL — ABNORMAL HIGH (ref 65–99)

## 2017-12-28 LAB — CBC WITH DIFFERENTIAL/PLATELET
BASOS PCT: 0 %
Basophils Absolute: 0 10*3/uL (ref 0.0–0.1)
EOS ABS: 0.3 10*3/uL (ref 0.0–0.7)
Eosinophils Relative: 3 %
HCT: 32.8 % — ABNORMAL LOW (ref 36.0–46.0)
Hemoglobin: 10 g/dL — ABNORMAL LOW (ref 12.0–15.0)
Lymphocytes Relative: 17 %
Lymphs Abs: 1.6 10*3/uL (ref 0.7–4.0)
MCH: 26.4 pg (ref 26.0–34.0)
MCHC: 30.5 g/dL (ref 30.0–36.0)
MCV: 86.5 fL (ref 78.0–100.0)
MONO ABS: 0.9 10*3/uL (ref 0.1–1.0)
Monocytes Relative: 9 %
NEUTROS ABS: 6.8 10*3/uL (ref 1.7–7.7)
Neutrophils Relative %: 71 %
PLATELETS: 282 10*3/uL (ref 150–400)
RBC: 3.79 MIL/uL — ABNORMAL LOW (ref 3.87–5.11)
RDW: 16.5 % — ABNORMAL HIGH (ref 11.5–15.5)
WBC: 9.6 10*3/uL (ref 4.0–10.5)

## 2017-12-28 LAB — RENAL FUNCTION PANEL
Albumin: 2.1 g/dL — ABNORMAL LOW (ref 3.5–5.0)
Anion gap: 8 (ref 5–15)
BUN: 54 mg/dL — ABNORMAL HIGH (ref 6–20)
CO2: 26 mmol/L (ref 22–32)
Calcium: 8.3 mg/dL — ABNORMAL LOW (ref 8.9–10.3)
Chloride: 107 mmol/L (ref 101–111)
Creatinine, Ser: 3.08 mg/dL — ABNORMAL HIGH (ref 0.44–1.00)
GFR calc Af Amer: 16 mL/min — ABNORMAL LOW (ref >60)
GFR calc non Af Amer: 14 mL/min — ABNORMAL LOW (ref >60)
Glucose, Bld: 134 mg/dL — ABNORMAL HIGH (ref 65–99)
Phosphorus: 3.3 mg/dL (ref 2.5–4.6)
Potassium: 3.5 mmol/L (ref 3.5–5.1)
Sodium: 141 mmol/L (ref 135–145)

## 2017-12-28 MED ORDER — METOPROLOL TARTRATE 25 MG PO TABS: 12.5000 mg | ORAL_TABLET | Freq: Two times a day (BID) | ORAL | Status: AC

## 2017-12-28 MED ORDER — FLUCONAZOLE 100 MG PO TABS: 100.0000 mg | ORAL_TABLET | Freq: Every day | ORAL | 0 refills | Status: AC

## 2017-12-28 NOTE — Clinical Social Work Placement (Signed)
Patient returning to [facility] SNF. Facility aware of patient's discharge and confirmed patient's ability to return. PTAR contacted, patient's family notified. Patient's RN can call report to [phone], packet complete. CSW signing off, no other needs identified at this time.   CLINICAL SOCIAL WORK PLACEMENT  NOTE  Date:  12/28/2017  Patient Details  Name: Jodi Underwood MRN: [id number] Date of Birth: 1945-09-30  Clinical Social Work is seeking post-discharge placement for this patient at the [place] level of care (*CSW will initial, date and re-position this form in  chart as items are completed):  Yes   Patient/family provided with [place] Work Department's list of facilities offering this level of care within the geographic area requested by the patient (or if unable, by the patient's family).  Yes   Patient/family informed of their freedom to choose among providers that offer the needed level of care, that participate in Medicare, Medicaid or managed care program needed by the patient, have an available bed and are willing to accept the patient.  Yes   Patient/family informed of [facility]'s ownership interest in [facility] and [facility], as well as of the fact that they are under no obligation to receive care at these facilities.  PASRR submitted to EDS on       PASRR number received on       Existing PASRR number confirmed on 12/27/17     FL2 transmitted to all facilities in geographic area requested by pt/family on 12/27/17     FL2 transmitted to all facilities within larger geographic area on       Patient informed that his/her managed care company has contracts with or will negotiate with certain facilities, including the following:        Yes   Patient/family informed of bed offers received.  Patient chooses bed at [facility]     Physician recommends and patient chooses bed at        Patient to be transferred to [facility] on 12/28/17.  Patient to be transferred to facility by PTAR     Patient family notified on 12/28/17 of transfer.  Name of family member notified:  [name]     PHYSICIAN       Additional Comment:    _______________________________________________ Burnis Medin, LCSW 12/28/2017, 3:42 PM

## 2017-12-28 NOTE — Care Management Note (Signed)
Case Management Note  Patient Details  Name: Jodi Underwood MRN: [id number] Date of Birth: [birth year]  Subjective/Objective:                    Action/Plan:   Expected Discharge Date:  12/28/17               Expected Discharge Plan:  Skilled Nursing Facility  In-House Referral:  Clinical Social Work  Discharge planning Services  CM Consult  Post Acute Care Choice:    Choice offered to:  Patient  DME Arranged:    DME Agency:     HH Arranged:    [place] Agency:     Status of Service:  Completed, signed off  If discussed at [employer], dates discussed:    Additional CommentsPurcell Mouton, RN 12/28/2017, 12:55 PM

## 2017-12-28 NOTE — Progress Notes (Signed)
RN and NT switching Pt over to bariatric bed.  Will call for CPAP after Pt is settled in new bed.

## 2017-12-28 NOTE — Progress Notes (Signed)
CSW contacted by [facility] SN[facility] staff member [name] who stated "send her without pt notes". Staff member [name] reported that patient can now return and insurance authorization is not needed.   CSW spoke with patient at bedside regarding discharge planning and PT recommendation that patient return to SNF. Patient reported that she is agreeable to return to [facility], noting she is tired of moving. CSW acknowledged and validated patient's feelings. Patient inquired if her daughter was aware, CSW agreed to update patient's daughters.   Abundio Miu, [place] Social Worker [facility] Cell#: [phone]

## 2017-12-28 NOTE — Discharge Summary (Signed)
Discharge Summary  Jodi Underwood IRW:[id number] DOB: 1946/03/19  PCP: Jodi Marble, MD  Admit date: 12/20/2017 Discharge date: 12/28/2017  Time spent: 35 mins  Recommendations for Outpatient Follow-up:  1. PCP  Discharge Diagnoses:  Active Hospital Problems   Diagnosis Date Noted  . Acute on chronic renal failure ([place])   . Encounter for palliative care   . Goals of care, counseling/discussion   . Chronic systolic CHF (congestive heart failure) ([place]) 12/20/2017  . A-fib ([place]) 06/11/2017  . AKI (acute kidney injury) ([place]) 10/13/2016  . Constipation   . UTI (urinary tract infection) 09/26/2014  . Lymphedema 06/13/2011  . Morbid obesity ([place]) 06/23/2010  . Essential hypertension 06/23/2010  . Asthma 06/23/2010    Resolved Hospital Problems  No resolved problems to display.    Discharge Condition: Stable  Diet recommendation: Heart healthy  Vitals:   12/27/17 2042 12/28/17 0415  BP: (!) 143/59 137/61  Pulse: 83 79  Resp: 20 16  Temp: 97.7 F (36.5 C) 98 F (36.7 C)  SpO2: 100% 100%    History of present illness:  Jodi McCampbellis a 72 y.o.femalewith medical history significant ofmorbid obesity, chronic lymphedema, hypertension, hypothyroidism, CKDstage 3sleep apnea, on oxygen on baselinewho was sent from [place] [facility] skilled nursing facility with complaints of generalized weakness, altered mental status and acute kidney injury. Patient is a poor historian. Patient was admitted hereand was discharged on 06/16/2017 after management for A. fib with RVR.She was discharged on warfarin but currently she is not on anticoagulation. Patient found to have a creatinine of 4.35 on presentation with elevated BUN. She also had Leukocytosis.  She was admitted for acute on chronic kidney disease stage III, urinary tract infection, and sigmoid colitis. Renal Function was not improving much with IVF hydration so Nephrology was consulted and they have stopped  BP medications and given fluid boluses and Cr is slowly trending down now. She appears to have volume overload now so started home diuretics, Bumex.    Today, pt reported feeling ok. Denies any new symptoms. Stable for discharge with close follow up regarding her renal function.   Hospital Course:  Principal Problem:   Acute on chronic renal failure [facility]) Active Problems:   Morbid obesity ([place])   Essential hypertension   Asthma   Lymphedema   UTI (urinary tract infection)   Constipation   AKI (acute kidney injury) ([place])   A-fib (H[facility])   Chronic systolic CHF (congestive heart failure) ([place])   Encounter for palliative care   Goals of care, counseling/discussion  AKI on CKD stage III, slowly improving  Baseline creatinine ranges from 1.4-2.3. Acute kidney injury could be from volume depletion versus UTI versus obstructive pathology. Checked CT stone study and showed Moderate nonspecific sigmoid colitis, which could be infectious or ischemic in etiology. No evidence of abscess or free fluid. Moderate paraumbilical hernia containing transverse colon and fat. Tiny nonobstructing calculi within the right renal collecting system. No evidence of ureteral calculi or hydronephrosis Nephrology consulted: recommending hydration. Patient is not a Dialysis Candidate per Nephrology Renal U/S showed Negative for age ultrasound appearance of both kidneys. Contracted urinary bladder with mild to moderate nonspecific wall thickening Palliative Care consulted, recommend SNF with palliative care   Fungal Urinary Tract Infection Afebrile with resolved leukocytosis U/A showed hemoglobin, large leukocytes, rare bacteria, greater than 50 WBCs and urine culture showed thousand colonies of yeast Patient was started empirically on IV ceftriaxone will continue for now given her possible sigmoid colitis and also started  patient on IV fluconazole with pharmacy to dose for the Yeast in her urine Repeat Urine  Culture showed 60,000 CFU of Yeast C/w PO Diflucan for a total of 14 days, last dose on 01/03/18  Sleep Apnea / Obesity Hypoventilation syndrome/chronic respiratory insufficiency:  Continue oxygen, saturating well CPAP during the Day  Heart failure with reduced ejection fraction:  Echocardiogram on 11/18 shows ejection fraction of 50%, hypokinesis of anteroseptal, inferior and inferoseptal myocardium.  BNP was 202.4 Continue bumex. Not on ACE or ARB given Renal Insufficiency   Morbid Obesity/Lymphedema: Patient is debilitated and bedbound due to her morbid obesity and lymphedema. [place] nurse for Lymphedema  History of A. Fib Was discharged on Coumadin as per the records on November 2018. Currently not on any anticoagulation.  EKG showed normal sinus rhythm. Continue Amiodarone 200 mg po Daily  Hypothyroidism  TSH was 17.474 ??Compliance with Synthroid Free T4 and was 0.84 and T3 was 51 (low) C/w Levothyroxine 50 mcg po Daily for now and may need to titrate up  Hypertension Stable Continue Amlodipine 5 mg po Daily and decreased Metoprolol 12.5mg  BID  Asthma Currently stable C/w Montelukast 10 mg po Daily   Sigmoid Colitis  Seen on CT Renal Stone Study S/P IV Zosyn Blood Cx x2 showed NGTD Monitor closely   Hyperglycemia in the setting of Prediabetes Patient's hemoglobin A1c was 6.1 Monitor closely  Normocytic Anemia/CKD Patient's Hb/Hct Stable  Somnolence/Confusion/Encephalopathy Improved CT head neg  ? Likely related to OSA and Sleep Apnea with Day time somnolence  Monitor closely     Procedures:  None  Consultations:  Nephrology  Palliative team   Discharge Exam: BP 137/61 (BP Location: Right Arm)   Pulse 79   Temp 98 F (36.7 C) (Oral)   Resp 16   Ht 5\' 6"  (1.676 m)   Wt (!) 165 kg (363 lb 12.8 oz)   SpO2 100%   BMI 58.72 kg/m   General: NAD Cardiovascular: S1, S2 present Respiratory: Diminished BS bilaterally    Discharge Instructions You were cared for by a hospitalist during your hospital stay. If you have any questions about your discharge medications or the care you received while you were in the hospital after you are discharged, you can call the unit and asked to speak with the hospitalist on call if the hospitalist that took care of you is not available. Once you are discharged, your primary care physician will handle any further medical issues. Please note that NO REFILLS for any discharge medications will be authorized once you are discharged, as it is imperative that you return to your primary care physician (or establish a relationship with a primary care physician if you do not have one) for your aftercare needs so that they can reassess your need for medications and monitor your lab values.   Allergies as of 12/28/2017      Reactions   Ciprofloxacin Other (See Comments)   Hallucinations      Medication List    STOP taking these medications   cefTRIAXone 1 g Solr injection Commonly known as:  ROCEPHIN     TAKE these medications   acetaminophen 325 MG tablet Commonly known as:  TYLENOL Take 650 mg by mouth every 6 (six) hours as needed (For pain.).   albuterol 0.63 MG/3ML nebulizer solution Commonly known as:  ACCUNEB Take 1 ampule by nebulization every 6 (six) hours as needed for wheezing or shortness of breath.   amiodarone 200 MG tablet Commonly known as:  PACERONE Take 200 mg by mouth daily.   amLODipine 5 MG tablet Commonly known as:  NORVASC Take 5 mg by mouth daily.   bumetanide 2 MG tablet Commonly known as:  BUMEX Take 2 mg by mouth daily.   cyclobenzaprine 10 MG tablet Commonly known as:  FLEXERIL Take 10 mg by mouth every 6 (six) hours as needed for muscle spasms.   docusate sodium 100 MG capsule Commonly known as:  COLACE Take 100 mg by mouth every other day.   fluconazole 100 MG tablet Commonly known as:  DIFLUCAN Take 1 tablet (100 mg total) by  mouth daily for 6 days. Start taking on:  12/29/2017   fluticasone 50 MCG/ACT nasal spray Commonly known as:  FLONASE Place 2 sprays into both nostrils daily. What changed:    how much to take  when to take this   guaiFENesin 600 MG 12 hr tablet Commonly known as:  MUCINEX Take 600 mg by mouth every 12 (twelve) hours.   levothyroxine 50 MCG tablet Commonly known as:  SYNTHROID, LEVOTHROID Take 50 mcg by mouth daily.   Melatonin 3 MG Tabs Take 3 mg by mouth at bedtime.   metoprolol tartrate 25 MG tablet Commonly known as:  LOPRESSOR Take 0.5 tablets (12.5 mg total) by mouth 2 (two) times daily. What changed:    how much to take  when to take this   montelukast 10 MG tablet Commonly known as:  SINGULAIR Take 10 mg by mouth daily.   ondansetron 8 MG tablet Commonly known as:  ZOFRAN Take by mouth every 8 (eight) hours as needed for nausea or vomiting.   potassium chloride 10 MEQ tablet Commonly known as:  K-DUR,KLOR-CON Take 10 mEq by mouth 3 (three) times daily.   promethazine 25 MG tablet Commonly known as:  PHENERGAN Take 25 mg by mouth every 6 (six) hours as needed for nausea or vomiting.   senna 8.6 MG Tabs tablet Commonly known as:  SENOKOT Take 1 tablet (8.6 mg total) by mouth daily. What changed:  when to take this   simethicone 80 MG chewable tablet Commonly known as:  MYLICON Chew 80 mg by mouth every 6 (six) hours as needed for flatulence.   traMADol 50 MG tablet Commonly known as:  ULTRAM Take 50 mg by mouth every 6 (six) hours as needed (For pain.).   ZYRTEC ALLERGY 10 MG tablet Generic drug:  cetirizine Take 10 mg by mouth daily.      Allergies  Allergen Reactions  . Ciprofloxacin Other (See Comments)    Hallucinations   Follow-up Information    Jodi Marble, MD. Schedule an appointment as soon as possible for a visit in 1 week(s).   Specialty:  Internal Medicine Contact information: [place]  [address] [phone]            The results of significant diagnostics from this hospitalization (including imaging, microbiology, ancillary and laboratory) are listed below for reference.    Significant Diagnostic Studies: Ct Head Wo Contrast  Result Date: 12/24/2017 CLINICAL DATA:  Unexplained altered level of consciousness. EXAM: CT HEAD WITHOUT CONTRAST TECHNIQUE: Contiguous axial images were obtained from the base of the skull through the vertex without intravenous contrast. COMPARISON:  None. FINDINGS: Brain: No evidence of acute infarction, hemorrhage, hydrocephalus, extra-axial collection, or mass lesion/mass effect. Vascular:  No hyperdense vessel or other acute findings. Skull: No evidence of fracture or other significant bone abnormality. Diffuse hyperostosis interna incidentally noted. Sinuses/Orbits:  No  acute findings. Other: None. IMPRESSION: No intracranial abnormality or other acute findings. Electronically Signed   By: Earle Gell M.D.   On: 12/24/2017 11:14   US Renal  Result Date: 12/20/2017 CLINICAL DATA:  72 year old female with acute kidney injury. Renal failure. UTI. EXAM: RENAL / URINARY TRACT ULTRASOUND COMPLETE COMPARISON:  Noncontrast CT Abdomen and Pelvis today reported separately. FINDINGS: Right Kidney: Length: 10.8 centimeters. Simple appearing 2.3 cm upper pole cyst (image 9). Echogenicity within normal limits. No solid mass or hydronephrosis visualized. Left Kidney: Length: 11.1 centimeters. Simple appearing 2.7 cm upper pole cyst (image 25). Echogenicity within normal limits. No solid mass or hydronephrosis visualized. Bladder: Decompressed urinary bladder with mild to moderate circumferential wall thickening (image 42). IMPRESSION: 1. Negative for age ultrasound appearance of both kidneys. 2. Contracted urinary bladder with mild to moderate nonspecific wall thickening. Electronically Signed   By: Genevie Ann M.D.   On: 12/20/2017 15:25   Dg Chest Port 1  View  Result Date: 12/25/2017 CLINICAL DATA:  Short of breath EXAM: PORTABLE CHEST 1 VIEW COMPARISON:  12/20/2017 FINDINGS: Suboptimal image due to large patient size and rotation. Mild bibasilar airspace disease.  No definite edema or effusion IMPRESSION: Suboptimal image. Recommend repeating. Two-view chest x-ray is suggested if possible. Mild bibasilar atelectasis/infiltrate. Electronically Signed   By: Franchot Gallo M.D.   On: 12/25/2017 08:51   Dg Chest Portable 1 View  Result Date: 12/20/2017 CLINICAL DATA:  Shortness of breath.  Confusion. EXAM: PORTABLE CHEST 1 VIEW COMPARISON:  One-view chest x-ray 06/10/2017. FINDINGS: The heart size is exaggerated by low lung volumes. There is no edema or effusion. No focal airspace disease is present. Mild degenerative changes are present at the shoulders, left greater than right. IMPRESSION: 1. Low lung volumes. 2. Acute cardiopulmonary disease. 3. Degenerative changes of the shoulders. Electronically Signed   By: San Morelle M.D.   On: 12/20/2017 10:55   Ct Renal Stone Study  Result Date: 12/20/2017 CLINICAL DATA:  Recurrent urinary tract infection. Nephrolithiasis. Chronic kidney disease. EXAM: CT ABDOMEN AND PELVIS WITHOUT CONTRAST TECHNIQUE: Multidetector CT imaging of the abdomen and pelvis was performed following the standard protocol without IV contrast. COMPARISON:  04/27/2016 FINDINGS: Lower Chest: No acute findings. Hepatobiliary: No hepatic masses identified. Gallbladder is unremarkable. Pancreas:  No mass or inflammatory changes. Spleen: Within normal limits in size and appearance. Adrenals/Urinary Tract: A few tiny calculi are seen within the right renal collecting system, largest in the right renal pelvis measuring 4 mm. No evidence of ureteral calculi or hydronephrosis. Unremarkable unopacified urinary bladder. Stomach/Bowel: Moderate wall thickening and mild pericolonic inflammatory changes are seen involving the sigmoid colon, although  there is no evidence of colonic diverticular disease in this region. No other areas of bowel wall thickening identified. No evidence of abscess or free fluid. A moderate paraumbilical hernia is seen which contains a loop of transverse colon and fat. Vascular/Lymphatic: No pathologically enlarged lymph nodes. No abdominal aortic aneurysm. Reproductive: Prior hysterectomy noted. Adnexal regions are unremarkable in appearance. Other:  None. Musculoskeletal:  No suspicious bone lesions identified. IMPRESSION: Moderate nonspecific sigmoid colitis, which could be infectious or ischemic in etiology. No evidence of abscess or free fluid. Moderate paraumbilical hernia containing transverse colon and fat. Tiny nonobstructing calculi within the right renal collecting system. No evidence of ureteral calculi or hydronephrosis. Electronically Signed   By: Earle Gell M.D.   On: 12/20/2017 15:23    Microbiology: Recent Results (from the past 240 hour(s))  Culture, Urine  Status: Abnormal   Collection Time: 12/20/17  2:19 PM  Result Value Ref Range Status   Specimen Description   Final    URINE, RANDOM Performed at [facility] [address], [place], [place] [address]    Special Requests   Final    NONE Performed at [facility], [place] [address] [place], [place] [address]    Culture 40,000 COLONIES/mL YEAST (A)  Final   Report Status 12/21/2017 FINAL  Final  Culture, blood (routine x 2)     Status: None   Collection Time: 12/21/17  4:41 PM  Result Value Ref Range Status   Specimen Description   Final    BLOOD LEFT HAND Performed at [facility] [address], [place], [place] [address]    Special Requests   Final    BOTTLES DRAWN AEROBIC ONLY Blood Culture adequate volume Performed at [facility] [address] [place], [place] [address]    Culture   Final    NO GROWTH 5 DAYS Performed at [facility],  [place] [address], [place], [place] [address]    Report Status 12/26/2017 FINAL  Final  Culture, blood (routine x 2)     Status: None   Collection Time: 12/21/17  4:43 PM  Result Value Ref Range Status   Specimen Description   Final    BLOOD RIGHT ANTECUBITAL Performed at [facility] [address], [place], [place] [address]    Special Requests   Final    BOTTLES DRAWN AEROBIC AND ANAEROBIC Blood Culture adequate volume Performed at [facility] [address] [place], [place] [address]    Culture   Final    NO GROWTH 5 DAYS Performed at [facility], [place] [address] [place], [place] [address]    Report Status 12/26/2017 FINAL  Final  Culture, Urine     Status: Abnormal   Collection Time: 12/21/17  5:43 PM  Result Value Ref Range Status   Specimen Description   Final    URINE, RANDOM Performed at [facility] [address] [place], [place] [address]    Special Requests   Final    NONE Performed at [facility], [place] [address], [place], [place] [address]    Culture 60,000 COLONIES/mL YEAST (A)  Final   Report Status 12/22/2017 FINAL  Final  Gastrointestinal Panel by PCR , Stool     Status: None   Collection Time: 12/22/17  3:59 AM  Result Value Ref Range Status   Campylobacter species NOT DETECTED NOT DETECTED Final   Plesimonas shigelloides NOT DETECTED NOT DETECTED Final   Salmonella species NOT DETECTED NOT DETECTED Final   Yersinia enterocolitica NOT DETECTED NOT DETECTED Final   Vibrio species NOT DETECTED NOT DETECTED Final   Vibrio cholerae NOT DETECTED NOT DETECTED Final   Enteroaggregative E coli (EAEC) NOT DETECTED NOT DETECTED Final   Enteropathogenic E coli (EPEC) NOT DETECTED NOT DETECTED Final   Enterotoxigenic E coli (ETEC) NOT DETECTED NOT DETECTED Final   Shiga like toxin producing E coli (STEC) NOT DETECTED NOT DETECTED Final   Shigella/Enteroinvasive E coli (EIEC) NOT  DETECTED NOT DETECTED Final   Cryptosporidium NOT DETECTED NOT DETECTED Final   Cyclospora cayetanensis NOT DETECTED NOT DETECTED Final   Entamoeba histolytica NOT DETECTED NOT DETECTED Final   Giardia lamblia NOT DETECTED NOT DETECTED Final   Adenovirus F40/41 NOT DETECTED NOT DETECTED  Final   Astrovirus NOT DETECTED NOT DETECTED Final   Norovirus GI/GII NOT DETECTED NOT DETECTED Final   Rotavirus A NOT DETECTED NOT DETECTED Final   Sapovirus (I, II, IV, and V) NOT DETECTED NOT DETECTED Final    Comment: Performed at [facility], [address] [place], [place] [address]     Labs: Basic Metabolic Panel: Recent Labs  Lab 12/22/17 0420 12/23/17 0402 12/24/17 0427 12/25/17 0447 12/27/17 0432 12/28/17 0428  NA 136 134* 137 139 142 141  K 4.7 4.3 4.0 3.9 3.8 3.5  CL 101 103 105 109 110 107  CO2 24 20* 20* 20* 22 26  GLUCOSE 127* 130* 138* 141* 125* 134*  BUN 78* 77* 79* 72* 56* 54*  CREATININE 4.20* 4.07* 3.76* 3.29* 3.02* 3.08*  CALCIUM 8.1* 7.9* 8.0* 7.8* 7.8* 8.3*  MG 2.1 2.1 2.1 2.0  --   --   PHOS 4.5 4.8* 4.5 3.8 3.1 3.3   Liver Function Tests: Recent Labs  Lab 12/22/17 0420 12/23/17 0402 12/24/17 0427 12/25/17 0447 12/27/17 0432 12/28/17 0428  AST 15 13* 15 18  --   --   ALT 11* 13* 11* 11*  --   --   ALKPHOS 92 90 134* 157*  --   --   BILITOT 0.6 0.6 0.8 0.7  --   --   PROT 5.9* 5.8* 5.8* 5.6*  --   --   ALBUMIN 2.2* 2.1* 2.2* 2.0* 2.0* 2.1*   No results for input(s): LIPASE, AMYLASE in the last 168 hours. No results for input(s): AMMONIA in the last 168 hours. CBC: Recent Labs  Lab 12/22/17 0420 12/23/17 0402 12/24/17 0427 12/25/17 0447 12/28/17 0428  WBC 21.3* 17.0* 16.1* 12.8* 9.6  NEUTROABS 16.0* 13.4* 12.7* 9.8* 6.8  HGB 10.2* 10.0* 9.6* 9.5* 10.0*  HCT 33.2* 32.5* 31.0* 30.4* 32.8*  MCV 86.0 85.8 85.9 86.6 86.5  PLT 287 266 286 268 282   Cardiac Enzymes: No results for input(s): CKTOTAL, CKMB, CKMBINDEX, TROPONINI in the last  168 hours. BNP: BNP (last 3 results) Recent Labs    06/11/17 0037 12/20/17 1140  BNP 109.9* 202.4*    ProBNP (last 3 results) No results for input(s): PROBNP in the last 8760 hours.  CBG: Recent Labs  Lab 12/27/17 0735 12/27/17 1139 12/27/17 1640 12/27/17 2251 12/28/17 0819  GLUCAP 126* 120* 130* 141* 125*       Signed:  Alma Friendly, MD [facility] 12/28/2017, 11:42 AM

## 2017-12-28 NOTE — Progress Notes (Signed)
CSW contacted by [facility] and informed that patient has [facility] and not traditional Medicare as listed on patient's chart. [employer] requires prior authorization and patient will need a PT evaluation completed. PT signed off on patient on 6/12, evaluation not completed. CSW will reach out to patient's attending MD to ask that PT be consulted again so patient can be evaluated and insurance authorization can be started.   Abundio Miu, [place] Social Worker [facility] Cell#: [phone]

## 2018-01-16 ENCOUNTER — Ambulatory Visit: Payer: Self-pay | Admitting: Podiatry

## 2018-01-25 ENCOUNTER — Emergency Department (HOSPITAL_COMMUNITY): Payer: Medicare Other

## 2018-01-25 ENCOUNTER — Other Ambulatory Visit: Payer: Self-pay

## 2018-01-25 ENCOUNTER — Encounter (HOSPITAL_COMMUNITY): Payer: Self-pay | Admitting: Emergency Medicine

## 2018-01-25 ENCOUNTER — Ambulatory Visit: Payer: Medicare Other | Admitting: Podiatry

## 2018-01-25 ENCOUNTER — Emergency Department (HOSPITAL_COMMUNITY)
Admission: EM | Admit: 2018-01-25 | Discharge: 2018-01-25 | Disposition: A | Discharge status: Home / Self Care | Payer: Medicare Other | Attending: Emergency Medicine | Admitting: Emergency Medicine

## 2018-01-25 DIAGNOSIS — Z79899 Other long term (current) drug therapy: Secondary | ICD-10-CM | POA: Diagnosis not present

## 2018-01-25 DIAGNOSIS — I110 Hypertensive heart disease with heart failure: Secondary | ICD-10-CM | POA: Insufficient documentation

## 2018-01-25 DIAGNOSIS — E119 Type 2 diabetes mellitus without complications: Secondary | ICD-10-CM | POA: Diagnosis not present

## 2018-01-25 DIAGNOSIS — I5022 Chronic systolic (congestive) heart failure: Secondary | ICD-10-CM | POA: Diagnosis not present

## 2018-01-25 DIAGNOSIS — R531 Weakness: Secondary | ICD-10-CM | POA: Diagnosis not present

## 2018-01-25 DIAGNOSIS — R319 Hematuria, unspecified: Secondary | ICD-10-CM

## 2018-01-25 DIAGNOSIS — J45909 Unspecified asthma, uncomplicated: Secondary | ICD-10-CM | POA: Diagnosis not present

## 2018-01-25 DIAGNOSIS — N390 Urinary tract infection, site not specified: Secondary | ICD-10-CM

## 2018-01-25 LAB — CBC WITH DIFFERENTIAL/PLATELET
ABS IMMATURE GRANULOCYTES: 0.1 10*3/uL (ref 0.0–0.1)
BASOS ABS: 0.2 10*3/uL — AB (ref 0.0–0.1)
Basophils Relative: 1 %
Eosinophils Absolute: 0.1 10*3/uL (ref 0.0–0.7)
Eosinophils Relative: 1 %
HCT: 34.3 % — ABNORMAL LOW (ref 36.0–46.0)
HEMOGLOBIN: 9.6 g/dL — AB (ref 12.0–15.0)
Immature Granulocytes: 1 %
LYMPHS PCT: 16 %
Lymphs Abs: 2.1 10*3/uL (ref 0.7–4.0)
MCH: 25.1 pg — AB (ref 26.0–34.0)
MCHC: 28 g/dL — AB (ref 30.0–36.0)
MCV: 89.6 fL (ref 78.0–100.0)
Monocytes Absolute: 1.7 10*3/uL — ABNORMAL HIGH (ref 0.1–1.0)
Monocytes Relative: 14 %
NEUTROS ABS: 8.4 10*3/uL — AB (ref 1.7–7.7)
Neutrophils Relative %: 67 %
Platelets: 311 10*3/uL (ref 150–400)
RBC: 3.83 MIL/uL — ABNORMAL LOW (ref 3.87–5.11)
RDW: 14.9 % (ref 11.5–15.5)
WBC: 12.6 10*3/uL — ABNORMAL HIGH (ref 4.0–10.5)

## 2018-01-25 LAB — URINALYSIS, ROUTINE W REFLEX MICROSCOPIC
BILIRUBIN URINE: NEGATIVE
Glucose, UA: 50 mg/dL — AB
KETONES UR: NEGATIVE mg/dL
Nitrite: NEGATIVE
PH: 5 (ref 5.0–8.0)
Protein, ur: 30 mg/dL — AB
Specific Gravity, Urine: 1.01 (ref 1.005–1.030)

## 2018-01-25 LAB — I-STAT TROPONIN, ED: Troponin i, poc: 0 ng/mL (ref 0.00–0.08)

## 2018-01-25 LAB — COMPREHENSIVE METABOLIC PANEL
ALBUMIN: 2.4 g/dL — AB (ref 3.5–5.0)
ALT: 14 U/L (ref 0–44)
ANION GAP: 13 (ref 5–15)
AST: 26 U/L (ref 15–41)
Alkaline Phosphatase: 95 U/L (ref 38–126)
BUN: 21 mg/dL (ref 8–23)
CHLORIDE: 98 mmol/L (ref 98–111)
CO2: 27 mmol/L (ref 22–32)
Calcium: 8.7 mg/dL — ABNORMAL LOW (ref 8.9–10.3)
Creatinine, Ser: 3.38 mg/dL — ABNORMAL HIGH (ref 0.44–1.00)
GFR calc Af Amer: 15 mL/min — ABNORMAL LOW (ref >60)
GFR calc non Af Amer: 13 mL/min — ABNORMAL LOW (ref >60)
GLUCOSE: 229 mg/dL — AB (ref 70–99)
POTASSIUM: 4.9 mmol/L (ref 3.5–5.1)
Sodium: 138 mmol/L (ref 135–145)
Total Bilirubin: 0.5 mg/dL (ref 0.3–1.2)
Total Protein: 6.9 g/dL (ref 6.5–8.1)

## 2018-01-25 LAB — LIPASE, BLOOD: LIPASE: 196 U/L — AB (ref 11–51)

## 2018-01-25 LAB — BRAIN NATRIURETIC PEPTIDE: B NATRIURETIC PEPTIDE 5: 88.6 pg/mL (ref 0.0–100.0)

## 2018-01-25 MED ORDER — ACETAMINOPHEN 325 MG PO TABS
650.0000 mg | ORAL_TABLET | Freq: Once | ORAL | Status: AC
  Administered 2018-01-25: 650 mg via ORAL
  Filled 2018-01-25: qty 2

## 2018-01-25 MED ORDER — CEPHALEXIN 250 MG PO CAPS: 250.0000 mg | ORAL_CAPSULE | Freq: Three times a day (TID) | ORAL | 0 refills | Status: AC

## 2018-01-25 NOTE — Discharge Instructions (Signed)
You were given a prescription for antibiotics. Please take the antibiotic prescription fully.   Please follow up with your primary care provider within 5-7 days for re-evaluation of your symptoms. If you do not have a primary care provider, information for a healthcare clinic has been provided for you to make arrangements for follow up care. Please return to the emergency department for any new or worsening symptoms.  

## 2018-01-25 NOTE — ED Triage Notes (Signed)
Pt arrives via [facility] from home where she was just discharged today from [facility] rehab, pt reports she has not been ambulatory since NOV. When she was admitted here for afib. Pt states she now has a bed in her home and the transport team and brought her home placed her in a wheelchair and she and her daughter were unable to get her into the bed. Pt reports feeling weak when she got home today.

## 2018-01-25 NOTE — ED Notes (Signed)
Report given to [facility] staff.  All belongings given to [facility] staff.  Patient A&O to baseline on discharge.

## 2018-01-25 NOTE — ED Provider Notes (Signed)
[facility] EMERGENCY DEPARTMENT Provider Note   CSN: [id number] Arrival date & time: 01/25/18  1225     History   Chief Complaint Chief Complaint  Patient presents with  . Weakness    HPI Jodi Underwood is a 72 y.o. female.  HPI   Patient is 72 year old female with a history of morbid obesity, chronic lipidemia, hypertension, hypothyroidism, CKD stage III, sleep apnea, oxygen requirement, afib, CHF,  who presents the emergency department today for evaluation of nausea, vomiting, generalized weakness and intermittent lightheadedness.  Patient has complicated medical history and has been admitted to several different hospitals over the last several months for A. fib with RVR, colitis, UTI.  She is also been admitted to several rehab facilities.  She was discharged from her rehab facility earlier today.  Upon arriving home she states that she felt nauseated and vomited about 2-3 times.  She also reports that she feels generally weak and intermittently lightheaded.  Lightheadedness seems to be associated with movement..  Her daughter at bedside states that she has not been moving around a lot while she has been admitted to the rehab facility, and today was the most that she had moved around in the last several months.  Patient denies any chest pain or shortness of breath.  She denies abdominal pain.  Denies any diarrhea or urinary symptoms.  No fevers.  Past Medical History:  Diagnosis Date  . Anemia   . Arthritis   . Asthma   . Atrial fibrillation ([place])   . Degenerative disc disease   . Diabetes mellitus   . Diabetic neuropathy ([place])   . Edema extremities   . Hypertension   . Lymphedema   . Pneumonia   . Urinary incontinence     Patient Active Problem List   Diagnosis Date Noted  . Encounter for palliative care   . Goals of care, counseling/discussion   . Chronic systolic CHF (congestive heart failure) ([place]) 12/20/2017  . A-fib ([place]) 06/11/2017  .  Cellulitis, leg 10/13/2016  . AKI (acute kidney injury) ([place]) 10/13/2016  . Hypertension   . Lymphedema of both lower extremities   . Abnormal CT of the abdomen   . Pressure injury of skin 04/28/2016  . Intractable lower abdominal pain 04/27/2016  . Constipation   . Respiratory distress   . Acute cystitis without hematuria   . Sepsis ([place]) 09/26/2014  . UTI (urinary tract infection) 09/26/2014  . Asthma 09/26/2014  . Asthma with acute exacerbation   . Blood poisoning   . Urinary tract infectious disease   . Diabetes type 2, uncontrolled ([place])   . Acute on chronic renal failure ([place])   . Essential hypertension, benign   . Acute renal failure ([place]) 01/25/2013  . Cellulitis of leg, bilateral 01/23/2013  . Ulcers of both lower extremities ([place]) 01/23/2013  . Lymphedema 06/13/2011  . DM type 2, uncontrolled, with neuropathy ([place]) 06/23/2010  . Morbid obesity ([place]) 06/23/2010  . ANEMIA-NOS 06/23/2010  . Essential hypertension 06/23/2010  . Asthma 06/23/2010    Past Surgical History:  Procedure Laterality Date  . ABDOMINAL HYSTERECTOMY     COMPLETE     OB History   None      Home Medications    Prior to Admission medications   Medication Sig Start Date End Date Taking? Authorizing Provider  acetaminophen (TYLENOL) 325 MG tablet Take 650 mg by mouth every 6 (six) hours as needed (For pain.).    [provider]  albuterol (  ACCUNEB) 0.63 MG/3ML nebulizer solution Take 1 ampule by nebulization every 6 (six) hours as needed for wheezing or shortness of breath.    [provider]  amiodarone (PACERONE) 200 MG tablet Take 200 mg by mouth daily. 08/25/17   [provider]  amLODipine (NORVASC) 5 MG tablet Take 5 mg by mouth daily.    [provider]  bumetanide (BUMEX) 2 MG tablet Take 2 mg by mouth daily. 11/09/17   [provider]  cephALEXin (KEFLEX) 250 MG capsule Take 1 capsule (250 mg total) by mouth 3 (three) times daily for 7 days.  01/25/18 02/01/18  Dylann Gallier S, PA-C  cetirizine (ZYRTEC ALLERGY) 10 MG tablet Take 10 mg by mouth daily.    [provider]  cyclobenzaprine (FLEXERIL) 10 MG tablet Take 10 mg by mouth every 6 (six) hours as needed for muscle spasms.    [provider]  docusate sodium (COLACE) 100 MG capsule Take 100 mg by mouth every other day.    [provider]  fluticasone (FLONASE) 50 MCG/ACT nasal spray Place 2 sprays into both nostrils daily. Patient taking differently: Place 1 spray into both nostrils every 12 (twelve) hours.  10/20/16   Doreatha Lew, MD  guaiFENesin (MUCINEX) 600 MG 12 hr tablet Take 600 mg by mouth every 12 (twelve) hours.    [provider]  levothyroxine (SYNTHROID, LEVOTHROID) 50 MCG tablet Take 50 mcg by mouth daily. 11/08/17   [provider]  Melatonin 3 MG TABS Take 3 mg by mouth at bedtime.    [provider]  metoprolol tartrate (LOPRESSOR) 25 MG tablet Take 0.5 tablets (12.5 mg total) by mouth 2 (two) times daily. 12/28/17   Alma Friendly, MD  montelukast (SINGULAIR) 10 MG tablet Take 10 mg by mouth daily. 08/24/17   [provider]  ondansetron (ZOFRAN) 8 MG tablet Take by mouth every 8 (eight) hours as needed for nausea or vomiting.    [provider]  potassium chloride (K-DUR,KLOR-CON) 10 MEQ tablet Take 10 mEq by mouth 3 (three) times daily.    [provider]  promethazine (PHENERGAN) 25 MG tablet Take 25 mg by mouth every 6 (six) hours as needed for nausea or vomiting.    [provider]  senna (SENOKOT) 8.6 MG TABS tablet Take 1 tablet (8.6 mg total) by mouth daily. Patient taking differently: Take 1 tablet by mouth every other day.  06/17/17   Patrecia Pour, MD  simethicone (MYLICON) 80 MG chewable tablet Chew 80 mg by mouth every 6 (six) hours as needed for flatulence.    [provider]  traMADol (ULTRAM) 50 MG tablet Take 50 mg by mouth every 6 (six) hours  as needed (For pain.).    [provider]    Family History Family History  Adopted: Yes    Social History Social History   Tobacco Use  . Smoking status: Never Smoker  . Smokeless tobacco: Never Used  Substance Use Topics  . Alcohol use: Yes    Comment: occassionally  . Drug use: No     Allergies   Ciprofloxacin   Review of Systems Review of Systems  Constitutional: Negative for chills and fever.  HENT: Negative for ear pain and sore throat.   Eyes: Negative for pain and visual disturbance.  Respiratory: Negative for cough and shortness of breath.   Cardiovascular: Negative for chest pain and palpitations.  Gastrointestinal: Positive for nausea and vomiting. Negative for abdominal pain, constipation  and diarrhea.  Genitourinary: Negative for dysuria, frequency, hematuria and urgency.  Musculoskeletal: Negative for arthralgias and back pain.  Skin: Negative for color change and rash.  Neurological: Positive for light-headedness. Negative for dizziness, weakness, numbness and headaches.  All other systems reviewed and are negative.   Physical Exam Updated Vital Signs BP 118/80   Pulse 85   Temp 97.8 F (36.6 C) (Oral)   Resp 14   Ht 5\' 6"  (1.676 m)   Wt (!) 165.6 kg (365 lb)   SpO2 100%   BMI 58.91 kg/m   Physical Exam  Constitutional: She appears well-developed and well-nourished. No distress.  HENT:  Head: Normocephalic and atraumatic.  Mouth/Throat: Oropharynx is clear and moist.  Eyes: Pupils are equal, round, and reactive to light. Conjunctivae and EOM are normal.  No nystagmus  Neck: Neck supple.  Cardiovascular: Normal rate, regular rhythm, normal heart sounds and intact distal pulses.  No murmur heard. Pulmonary/Chest: Effort normal and breath sounds normal. No stridor. No respiratory distress. She has no wheezes.  Abdominal: Soft. Bowel sounds are normal. There is no tenderness. There is no guarding.  Musculoskeletal: She exhibits no  edema.  Neurological: She is alert.  Mental Status:  Alert, thought content appropriate, able to give a coherent history. Speech fluent without evidence of aphasia. Able to follow 2 step commands without difficulty.  Cranial Nerves:  II:  pupils equal, round, reactive to light III,IV, VI: ptosis not present, extra-ocular motions intact bilaterally  V,VII: smile symmetric, facial light touch sensation equal VIII: hearing grossly normal to voice  X: uvula elevates symmetrically  XI: bilateral shoulder shrug symmetric and strong XII: midline tongue extension without fassiculations Motor:  Normal tone. 5/5 strength of BUE and BLE major muscle groups including strong and equal grip strength and dorsiflexion/plantar flexion Sensory: light touch normal in all extremities. Gait: not assessed CV: 2+ radial and DP/PT pulses  Skin: Skin is warm and dry. Capillary refill takes less than 2 seconds.  Psychiatric: She has a normal mood and affect.  Nursing note and vitals reviewed.    ED Treatments / Results  Labs (all labs ordered are listed, but only abnormal results are displayed) Labs Reviewed  CBC WITH DIFFERENTIAL/PLATELET - Abnormal; Notable for the following components:      Result Value   WBC 12.6 (*)    RBC 3.83 (*)    Hemoglobin 9.6 (*)    HCT 34.3 (*)    MCH 25.1 (*)    MCHC 28.0 (*)    Neutro Abs 8.4 (*)    Monocytes Absolute 1.7 (*)    Basophils Absolute 0.2 (*)    All other components within normal limits  LIPASE, BLOOD - Abnormal; Notable for the following components:   Lipase 196 (*)    All other components within normal limits  URINALYSIS, ROUTINE W REFLEX MICROSCOPIC - Abnormal; Notable for the following components:   Color, Urine AMBER (*)    APPearance TURBID (*)    Glucose, UA 50 (*)    Hgb urine dipstick SMALL (*)    Protein, ur 30 (*)    Leukocytes, UA LARGE (*)    WBC, UA >50 (*)    Bacteria, UA RARE (*)    Non Squamous Epithelial 0-5 (*)    All other  components within normal limits  COMPREHENSIVE METABOLIC PANEL - Abnormal; Notable for the following components:   Glucose, Bld 229 (*)    Creatinine, Ser 3.38 (*)    Calcium 8.7 (*)  Albumin 2.4 (*)    GFR calc non Af Amer 13 (*)    GFR calc Af Amer 15 (*)    All other components within normal limits  URINE CULTURE  BRAIN NATRIURETIC PEPTIDE  I-STAT TROPONIN, ED    EKG EKG Interpretation  Date/Time:  Friday January 25 2018 15:55:51 EDT Ventricular Rate:  86 PR Interval:    QRS Duration: 111 QT Interval:  391 QTC Calculation: 468 R Axis:   20 Text Interpretation:  Sinus or ectopic atrial rhythm Nonspecific T abnormalities, lateral leads Confirmed by Dene Gentry [phone]) on 01/25/2018 3:58:39 PM   Radiology Dg Chest Portable 1 View  Result Date: 01/25/2018 CLINICAL DATA:  72 y/o  F; lightheaded and weak. EXAM: PORTABLE CHEST 1 VIEW COMPARISON:  12/25/2017 chest radiograph. FINDINGS: Stable cardiomegaly given projection and technique. Pulmonary vascular congestion. No consolidation, effusion, or pneumothorax identified. Aortic atherosclerosis. Bones are unremarkable. IMPRESSION: Stable cardiomegaly and aortic atherosclerosis. Pulmonary vascular congestion. No consolidation. Electronically Signed   By: Kristine Garbe M.D.   On: 01/25/2018 14:24    Procedures Procedures (including critical care time)  Medications Ordered in ED Medications - No data to display   Initial Impression / Assessment and Plan / ED Course  I have reviewed the triage vital signs and the nursing notes.  Pertinent labs & imaging results that were available during my care of the patient were reviewed by me and considered in my medical decision making (see chart for details).  Discussed patient case with Mariann Laster from case management.  She discussed the plan with the family.  She states that there are currently orders in place for PT/OT/home health locally that has been arranged by her rehab  facility.  Patient would like to relocate to [place]. Case management requested face to face consult for these services to be set up if patient decides to move to [place] with her daughter.    440 PM reevaluated the patient.  She states she feels completely back to normal.  States she was able to have a bowel movement.  She no longer feels nauseated and she has no lightheadedness.  Continues to deny chest pain or shortness of breath.  Discussed the results of her lab work and the plan for discharge.  Patient is comfort with the plan for discharge.  Discussed pt presentation and exam findings with Dr. Francia Greaves, who personally evaluated the patient and agrees with the work-up and plan for discharge on Keflex.   Final Clinical Impressions(s) / ED Diagnoses   Final diagnoses:  Generalized weakness  Urinary tract infection with hematuria, site unspecified   Patient presents the ED today complaining of generalized weakness, intermittent lightheadedness that seems positional and nausea and vomiting that occurred prior to arrival.  Her vital signs have remained stable here and she is afebrile.  Her abdominal exam is benign. All symptoms improved after arrival to the emergency department and after having a bowel movement.  EKG with ectopic atrial rhythm and no ischemic changes.  She has some nonspecific T wave abnormalities.  Chest x-ray with no infiltrates. Sxs sound atypical for ACS.  She does have some pulmonary vascular congestion and stable cardiomegaly.  No URI symptoms to suggest pneumonia.  Neurologic exam is benign and doubt TIA or CVA.  Patient has been deconditioned over the last 7 months and I suspect that she overexerted herself today causing some of her symptoms.  She was found to have UTI on her urinalysis.  We will start her on a  course of Keflex and have her follow-up with her PCP.  Her other labs showed a mild leukocytosis, stable anemia.  Lipase is mildly elevated however this is nonspecific.   Her creatinine is 3.38 which is baseline for her.  Glucose is also elevated however there is no elevated anion gap to suggest DKA.  BNP is negative.  Troponin is negative.  Patient has no history of pancreatitis and has no abdominal pain.  As above case management was consulted and orders were placed for face-to-face consult so that patient can set up services that will be needed now that she is transitioned back home from rehab facility.  Patient is comfortable with the plan for discharge.  Was given strict return precautions for any new or worsening symptoms.  All questions were answered and patient understands the plan.  ED Discharge Orders        [place]     01/25/18 1655    Face-to-face encounter (required for Medicare/Medicaid patients)    Comments:  I [place] certify that this patient is under my care and that I, or a nurse practitioner or physician's assistant working with me, had a face-to-face encounter that meets the physician face-to-face encounter requirements with this patient on 01/25/2018. The encounter with the patient was in whole, or in part for the following medical condition(s) which is the primary reason for home health care (List medical condition): Patient needs complete help with ADLs, she is on chronic oxygen and will need respiratory care, she also needs PT/OT as well as an RN for medication management for her multiple comorbidities.  She will also need home health for her help with her ADLs.   01/25/18 1655    cephALEXin (KEFLEX) 250 MG capsule  3 times daily     01/25/18 1701       Royale Lennartz S, PA-C 01/25/18 1703    Valarie Merino, MD 01/27/18 1000

## 2018-01-25 NOTE — ED Notes (Signed)
[facility] called for pt transport. 

## 2018-01-25 NOTE — Care Management (Signed)
ED CM met with patient at bedside, patient reports being discharged from [facility] with [facility] services to be initiated. Patient states, she will be going to [place] to recover with daughter Jodi Underwood [phone] who will be her caregiver. CM spoke with the daughter, she has requested that her [facility] services be set up with [facility] in [place] , reviewed insurance patient has Medicare. CM placed call for on-call nurse at [facility] in [place], spoke with on call nurse who requested I faxed the referral to [phone]. Referral was faxed confirmation received. CM updated patient and daughter, Patient discharge prescription were also faxed at the patient's request to [facility] on [address] confirmed the receipt of the prescription with [facility]. Patient and daughter are agreement wit transitional plan of care, and were appreciative of the assistance. CM updated C. Couture PA-C on [employer] and RN caring for patient. No further ED CM needs identified.

## 2018-01-26 LAB — URINE CULTURE: CULTURE: NO GROWTH

## 2018-01-29 ENCOUNTER — Telehealth: Payer: Self-pay | Admitting: Emergency Medicine

## 2018-01-29 NOTE — Telephone Encounter (Signed)
Received call from [place] with [facility] in [place].  She was wondering if pt had D/C or if pt was still here.  CM advised her that pt was in the ED on 01/25/18 and CM saw no other information about pt's current location.  Verdis Frederickson advised she would reach out to pt to find out if she was back in [place].  No further CM needs noted at this time.

## 2018-02-01 ENCOUNTER — Encounter (HOSPITAL_COMMUNITY): Payer: Self-pay | Admitting: Nurse Practitioner

## 2018-02-01 ENCOUNTER — Inpatient Hospital Stay (HOSPITAL_COMMUNITY)
Admission: EM | Admit: 2018-02-01 | Discharge: 2018-02-05 | DRG: 392 | Disposition: A | Discharge status: Home / Self Care | Payer: Medicare Other | Attending: Internal Medicine | Admitting: Internal Medicine

## 2018-02-01 ENCOUNTER — Emergency Department (HOSPITAL_COMMUNITY): Payer: Medicare Other

## 2018-02-01 DIAGNOSIS — E860 Dehydration: Secondary | ICD-10-CM | POA: Diagnosis not present

## 2018-02-01 DIAGNOSIS — I4891 Unspecified atrial fibrillation: Secondary | ICD-10-CM | POA: Diagnosis present

## 2018-02-01 DIAGNOSIS — N183 Chronic kidney disease, stage 3 unspecified: Secondary | ICD-10-CM | POA: Diagnosis present

## 2018-02-01 DIAGNOSIS — D631 Anemia in chronic kidney disease: Secondary | ICD-10-CM | POA: Diagnosis present

## 2018-02-01 DIAGNOSIS — IMO0002 Reserved for concepts with insufficient information to code with codable children: Secondary | ICD-10-CM | POA: Diagnosis present

## 2018-02-01 DIAGNOSIS — K529 Noninfective gastroenteritis and colitis, unspecified: Secondary | ICD-10-CM | POA: Diagnosis not present

## 2018-02-01 DIAGNOSIS — E039 Hypothyroidism, unspecified: Secondary | ICD-10-CM | POA: Diagnosis present

## 2018-02-01 DIAGNOSIS — I890 Lymphedema, not elsewhere classified: Secondary | ICD-10-CM

## 2018-02-01 DIAGNOSIS — Z6841 Body Mass Index (BMI) 40.0 and over, adult: Secondary | ICD-10-CM

## 2018-02-01 DIAGNOSIS — I10 Essential (primary) hypertension: Secondary | ICD-10-CM | POA: Diagnosis present

## 2018-02-01 DIAGNOSIS — Z5320 Procedure and treatment not carried out because of patient's decision for unspecified reasons: Secondary | ICD-10-CM | POA: Diagnosis not present

## 2018-02-01 DIAGNOSIS — E1140 Type 2 diabetes mellitus with diabetic neuropathy, unspecified: Secondary | ICD-10-CM | POA: Diagnosis present

## 2018-02-01 DIAGNOSIS — M25562 Pain in left knee: Secondary | ICD-10-CM | POA: Diagnosis present

## 2018-02-01 DIAGNOSIS — E1165 Type 2 diabetes mellitus with hyperglycemia: Secondary | ICD-10-CM | POA: Diagnosis present

## 2018-02-01 DIAGNOSIS — R000 Tachycardia, unspecified: Secondary | ICD-10-CM | POA: Diagnosis present

## 2018-02-01 DIAGNOSIS — Z888 Allergy status to other drugs, medicaments and biological substances status: Secondary | ICD-10-CM

## 2018-02-01 DIAGNOSIS — E1122 Type 2 diabetes mellitus with diabetic chronic kidney disease: Secondary | ICD-10-CM | POA: Diagnosis present

## 2018-02-01 DIAGNOSIS — I5022 Chronic systolic (congestive) heart failure: Secondary | ICD-10-CM | POA: Diagnosis present

## 2018-02-01 DIAGNOSIS — N184 Chronic kidney disease, stage 4 (severe): Secondary | ICD-10-CM | POA: Diagnosis present

## 2018-02-01 DIAGNOSIS — N179 Acute kidney failure, unspecified: Secondary | ICD-10-CM

## 2018-02-01 DIAGNOSIS — I130 Hypertensive heart and chronic kidney disease with heart failure and stage 1 through stage 4 chronic kidney disease, or unspecified chronic kidney disease: Secondary | ICD-10-CM | POA: Diagnosis present

## 2018-02-01 DIAGNOSIS — R197 Diarrhea, unspecified: Secondary | ICD-10-CM | POA: Diagnosis present

## 2018-02-01 DIAGNOSIS — R112 Nausea with vomiting, unspecified: Secondary | ICD-10-CM | POA: Diagnosis present

## 2018-02-01 DIAGNOSIS — G4733 Obstructive sleep apnea (adult) (pediatric): Secondary | ICD-10-CM | POA: Diagnosis present

## 2018-02-01 DIAGNOSIS — Z9981 Dependence on supplemental oxygen: Secondary | ICD-10-CM

## 2018-02-01 DIAGNOSIS — Z79890 Hormone replacement therapy (postmenopausal): Secondary | ICD-10-CM

## 2018-02-01 DIAGNOSIS — Z79899 Other long term (current) drug therapy: Secondary | ICD-10-CM

## 2018-02-01 DIAGNOSIS — J45909 Unspecified asthma, uncomplicated: Secondary | ICD-10-CM | POA: Diagnosis present

## 2018-02-01 LAB — CBC WITH DIFFERENTIAL/PLATELET
BASOS PCT: 0 %
Basophils Absolute: 0 10*3/uL (ref 0.0–0.1)
EOS PCT: 1 %
Eosinophils Absolute: 0.1 10*3/uL (ref 0.0–0.7)
HEMATOCRIT: 30.1 % — AB (ref 36.0–46.0)
Hemoglobin: 8.8 g/dL — ABNORMAL LOW (ref 12.0–15.0)
Lymphocytes Relative: 30 %
Lymphs Abs: 2.3 10*3/uL (ref 0.7–4.0)
MCH: 25.2 pg — ABNORMAL LOW (ref 26.0–34.0)
MCHC: 29.2 g/dL — AB (ref 30.0–36.0)
MCV: 86.2 fL (ref 78.0–100.0)
MONO ABS: 2.4 10*3/uL — AB (ref 0.1–1.0)
MONOS PCT: 32 %
NEUTROS ABS: 2.8 10*3/uL (ref 1.7–7.7)
Neutrophils Relative %: 37 %
PLATELETS: 377 10*3/uL (ref 150–400)
RBC: 3.49 MIL/uL — ABNORMAL LOW (ref 3.87–5.11)
RDW: 15.9 % — AB (ref 11.5–15.5)
WBC: 7.6 10*3/uL (ref 4.0–10.5)

## 2018-02-01 LAB — COMPREHENSIVE METABOLIC PANEL
ALK PHOS: 75 U/L (ref 38–126)
ALT: 12 U/L (ref 0–44)
AST: 20 U/L (ref 15–41)
Albumin: 2.2 g/dL — ABNORMAL LOW (ref 3.5–5.0)
Anion gap: 9 (ref 5–15)
BILIRUBIN TOTAL: 0.6 mg/dL (ref 0.3–1.2)
BUN: 34 mg/dL — AB (ref 8–23)
CALCIUM: 8.1 mg/dL — AB (ref 8.9–10.3)
CO2: 29 mmol/L (ref 22–32)
CREATININE: 3.6 mg/dL — AB (ref 0.44–1.00)
Chloride: 98 mmol/L (ref 98–111)
GFR calc Af Amer: 14 mL/min — ABNORMAL LOW (ref >60)
GFR, EST NON AFRICAN AMERICAN: 12 mL/min — AB (ref >60)
Glucose, Bld: 126 mg/dL — ABNORMAL HIGH (ref 70–99)
POTASSIUM: 3.9 mmol/L (ref 3.5–5.1)
Sodium: 136 mmol/L (ref 135–145)
TOTAL PROTEIN: 6.6 g/dL (ref 6.5–8.1)

## 2018-02-01 LAB — URINALYSIS, ROUTINE W REFLEX MICROSCOPIC
Bilirubin Urine: NEGATIVE
Glucose, UA: NEGATIVE mg/dL
Ketones, ur: NEGATIVE mg/dL
Nitrite: NEGATIVE
Protein, ur: NEGATIVE mg/dL
Specific Gravity, Urine: 1.014 (ref 1.005–1.030)
WBC, UA: >50 WBC/hpf — ABNORMAL HIGH (ref 0–5)
pH: 5 (ref 5.0–8.0)

## 2018-02-01 LAB — LIPASE, BLOOD: Lipase: 20 U/L (ref 11–51)

## 2018-02-01 LAB — POC OCCULT BLOOD, ED: Fecal Occult Bld: NEGATIVE

## 2018-02-01 MED ORDER — SODIUM CHLORIDE 0.9 % IV BOLUS
500.0000 mL | Freq: Once | INTRAVENOUS | Status: AC
  Administered 2018-02-01: 500 mL via INTRAVENOUS

## 2018-02-01 NOTE — ED Notes (Signed)
Urine culture sent down with UA. 

## 2018-02-01 NOTE — ED Provider Notes (Signed)
[facility] DEPT Provider Note   CSN: [id number] Arrival date & time: 02/01/18  1630     History   Chief Complaint Chief Complaint  Patient presents with  . Abdominal Pain  . Leg Swelling    Bilateral    HPI Jodi Underwood is a 72 y.o. female.  HPI Patient presents with abdominal pain nausea diarrhea and some reported blood in the stool.  Patient states that she feels weak.  No fevers.  Has had fatigue.  Has upper abdominal pain.  Has some swelling on her legs and states that is chronic.  States it is not increased.  States however she did have a fall yesterday and has pain near her left knee on her lower leg. Past Medical History:  Diagnosis Date  . Anemia   . Arthritis   . Asthma   . Atrial fibrillation ([place])   . Degenerative disc disease   . Diabetes mellitus   . Diabetic neuropathy ([place])   . Edema extremities   . Hypertension   . Lymphedema   . Pneumonia   . Urinary incontinence     Patient Active Problem List   Diagnosis Date Noted  . Encounter for palliative care   . Goals of care, counseling/discussion   . Chronic systolic CHF (congestive heart failure) ([place]) 12/20/2017  . A-fib ([place]) 06/11/2017  . Cellulitis, leg 10/13/2016  . AKI (acute kidney injury) ([place]) 10/13/2016  . Hypertension   . Lymphedema of both lower extremities   . Abnormal CT of the abdomen   . Pressure injury of skin 04/28/2016  . Intractable lower abdominal pain 04/27/2016  . Constipation   . Respiratory distress   . Acute cystitis without hematuria   . Sepsis ([place]) 09/26/2014  . UTI (urinary tract infection) 09/26/2014  . Asthma 09/26/2014  . Asthma with acute exacerbation   . Blood poisoning   . Urinary tract infectious disease   . Diabetes type 2, uncontrolled ([place])   . Acute on chronic renal failure ([place])   . Essential hypertension, benign   . Acute renal failure ([place]) 01/25/2013  . Cellulitis of leg, bilateral 01/23/2013  . Ulcers of  both lower extremities ([place]) 01/23/2013  . Lymphedema 06/13/2011  . DM type 2, uncontrolled, with neuropathy ([place]) 06/23/2010  . Morbid obesity ([place]) 06/23/2010  . ANEMIA-NOS 06/23/2010  . Essential hypertension 06/23/2010  . Asthma 06/23/2010    Past Surgical History:  Procedure Laterality Date  . ABDOMINAL HYSTERECTOMY     COMPLETE     OB History   None      Home Medications    Prior to Admission medications   Medication Sig Start Date End Date Taking? Authorizing Provider  acetaminophen (TYLENOL) 325 MG tablet Take 650 mg by mouth every 6 (six) hours as needed (For pain.).    [provider]  albuterol (ACCUNEB) 0.63 MG/3ML nebulizer solution Take 1 ampule by nebulization every 6 (six) hours as needed for wheezing or shortness of breath.    [provider]  amiodarone (PACERONE) 200 MG tablet Take 200 mg by mouth daily. 08/25/17   [provider]  amLODipine (NORVASC) 5 MG tablet Take 5 mg by mouth daily.    [provider]  bumetanide (BUMEX) 2 MG tablet Take 2 mg by mouth daily. 11/09/17   [provider]  cephALEXin (KEFLEX) 250 MG capsule Take 1 capsule (250 mg total) by mouth 3 (three) times daily for 7 days. 01/25/18 02/01/18  Couture, Cortni S,  PA-C  cetirizine (ZYRTEC ALLERGY) 10 MG tablet Take 10 mg by mouth daily.    [provider]  cyclobenzaprine (FLEXERIL) 10 MG tablet Take 10 mg by mouth every 6 (six) hours as needed for muscle spasms.    [provider]  docusate sodium (COLACE) 100 MG capsule Take 100 mg by mouth every other day.    [provider]  fluticasone (FLONASE) 50 MCG/ACT nasal spray Place 2 sprays into both nostrils daily. Patient taking differently: Place 1 spray into both nostrils every 12 (twelve) hours.  10/20/16   Doreatha Lew, MD  guaiFENesin (MUCINEX) 600 MG 12 hr tablet Take 600 mg by mouth every 12 (twelve) hours.    [provider]  levothyroxine (SYNTHROID,  LEVOTHROID) 50 MCG tablet Take 50 mcg by mouth daily. 11/08/17   [provider]  Melatonin 3 MG TABS Take 3 mg by mouth at bedtime.    [provider]  metoprolol tartrate (LOPRESSOR) 25 MG tablet Take 0.5 tablets (12.5 mg total) by mouth 2 (two) times daily. 12/28/17   Alma Friendly, MD  montelukast (SINGULAIR) 10 MG tablet Take 10 mg by mouth daily. 08/24/17   [provider]  ondansetron (ZOFRAN) 8 MG tablet Take by mouth every 8 (eight) hours as needed for nausea or vomiting.    [provider]  potassium chloride (K-DUR) 10 MEQ tablet  01/25/18   [provider]  potassium chloride (K-DUR,KLOR-CON) 10 MEQ tablet Take 10 mEq by mouth 3 (three) times daily.    [provider]  promethazine (PHENERGAN) 25 MG tablet Take 25 mg by mouth every 6 (six) hours as needed for nausea or vomiting.    [provider]  senna (SENOKOT) 8.6 MG TABS tablet Take 1 tablet (8.6 mg total) by mouth daily. Patient taking differently: Take 1 tablet by mouth every other day.  06/17/17   Patrecia Pour, MD  simethicone (MYLICON) 80 MG chewable tablet Chew 80 mg by mouth every 6 (six) hours as needed for flatulence.    [provider]  traMADol (ULTRAM) 50 MG tablet Take 50 mg by mouth every 6 (six) hours as needed (For pain.).    [provider]    Family History Family History  Adopted: Yes    Social History Social History   Tobacco Use  . Smoking status: Never Smoker  . Smokeless tobacco: Never Used  Substance Use Topics  . Alcohol use: Yes    Comment: occassionally  . Drug use: No     Allergies   Ciprofloxacin   Review of Systems Review of Systems  Constitutional: Positive for appetite change.  HENT: Negative for congestion.   Respiratory: Negative for shortness of breath.   Cardiovascular: Positive for leg swelling. Negative for chest pain.  Gastrointestinal: Positive for abdominal pain, blood in stool, diarrhea  and nausea. Negative for vomiting.  Genitourinary: Negative for flank pain.  Musculoskeletal: Positive for back pain.  Skin: Negative for rash and wound.  Neurological: Negative for weakness.  Hematological: Negative for adenopathy.  Psychiatric/Behavioral: Negative for confusion.     Physical Exam Updated Vital Signs BP 126/65 (BP Location: Right Arm)   Pulse (!) 115   Temp 99 F (37.2 C) (Oral)   Resp 14   SpO2 99%   Physical Exam  Constitutional: She appears well-developed.  Eyes: Pupils are equal, round, and reactive to light.  Cardiovascular: Regular rhythm.  Pulmonary/Chest:  Few scattered wheezes per  Abdominal: Normal appearance.  Patient with fullness in the abdomen.  Somewhat difficult exam due to body habitus.  Fullness the abdomen and upper abdomen.  No hernia palpated.  Neurological: She is alert.  Skin: Skin is warm. Capillary refill takes less than 2 seconds.  Tenderness to left lower extremity just distal to knee on the lateral aspect of her left lower leg.  Some mild erythema at the site.  Chronic edema of both lower extremities, baseline per patient.   ED Treatments / Results  Labs (all labs ordered are listed, but only abnormal results are displayed) Labs Reviewed  COMPREHENSIVE METABOLIC PANEL - Abnormal; Notable for the following components:      Result Value   Glucose, Bld 126 (*)    BUN 34 (*)    Creatinine, Ser 3.60 (*)    Calcium 8.1 (*)    Albumin 2.2 (*)    GFR calc non Af Amer 12 (*)    GFR calc Af Amer 14 (*)    All other components within normal limits  CBC WITH DIFFERENTIAL/PLATELET - Abnormal; Notable for the following components:   RBC 3.49 (*)    Hemoglobin 8.8 (*)    HCT 30.1 (*)    MCH 25.2 (*)    MCHC 29.2 (*)    RDW 15.9 (*)    Monocytes Absolute 2.4 (*)    All other components within normal limits  LIPASE, BLOOD  URINALYSIS, ROUTINE W REFLEX MICROSCOPIC  POC OCCULT BLOOD, ED    EKG EKG  Interpretation  Date/Time:  Friday February 01 2018 22:17:02 EDT Ventricular Rate:  110 PR Interval:    QRS Duration: 107 QT Interval:  332 QTC Calculation: 450 R Axis:   35 Text Interpretation:  Sinus rhythm Abnormal T, consider ischemia, lateral leads Baseline wander in lead(s) V1 Confirmed by Davonna Belling [phone]) on 02/01/2018 10:23:44 PM   Radiology Ct Abdomen Pelvis Wo Contrast  Result Date: 02/01/2018 CLINICAL DATA:  Abdominal pain with nausea, vomiting and diarrhea. EXAM: CT ABDOMEN AND PELVIS WITHOUT CONTRAST TECHNIQUE: Multidetector CT imaging of the abdomen and pelvis was performed following the standard protocol without IV contrast. COMPARISON:  CT abdomen pelvis 12/20/2017 FINDINGS: LOWER CHEST: Bibasilar atelectasis. HEPATOBILIARY: Normal hepatic contours and density. No intra- or extrahepatic biliary dilatation. Layering density in the gallbladder, likely sludge. PANCREAS: Normal parenchymal contours without ductal dilatation. No peripancreatic fluid collection. SPLEEN: Normal. ADRENALS/URINARY TRACT: --Adrenal glands: Normal. --Right kidney/ureter: Unchanged nonobstructive 3 mm upper renal pelvis stone. No hydronephrosis. --Left kidney/ureter: No hydronephrosis, nephroureterolithiasis, perinephric stranding or solid renal mass. --Urinary bladder: Normal for degree of distention STOMACH/BOWEL: --Stomach/Duodenum: No hiatal hernia or other gastric abnormality. Normal duodenal course. --Small bowel: No dilatation or inflammation. --Colon: There is persistent wall thickening of the ascending colon, likely due to underdistention, as there is no surrounding inflammatory stranding. Inflammation surrounding the descending and sigmoid colon has resolved. --Appendix: Not visualized. No right lower quadrant inflammation or free fluid. VASCULAR/LYMPHATIC: Normal course and caliber of the major abdominal vessels. No abdominal or pelvic lymphadenopathy. REPRODUCTIVE: Status post hysterectomy. No  adnexal mass. MUSCULOSKELETAL. Extensive degenerative disc disease and facet arthrosis with at least moderate L4-5 spinal canal stenosis. There is also spinal canal stenosis at the T12-L1 level. OTHER: Fat-containing paraumbilical hernia is unchanged in size, but no longer contains a loop of bowel. IMPRESSION: 1. Resolution of previously seen descending/sigmoid colon inflammation. 2. No acute abdominal or pelvic abnormality. 3. Moderate spinal canal stenosis at T12-L1 and L4-5. 4. Nonobstructive right nephrolithiasis. Electronically Signed   By:  Ulyses Jarred M.D.   On: 02/01/2018 19:00   Dg Chest 2 View  Result Date: 02/01/2018 CLINICAL DATA:  Abdominal pain, nausea, vomiting and diarrhea. Bilateral lower extremity swelling. EXAM: CHEST - 2 VIEW COMPARISON:  01/25/2018. FINDINGS: Stable mildly enlarged cardiac silhouette. Stable linear scarring or atelectasis at the right lung base. Otherwise, clear lungs with normal vascularity. Thoracic spine degenerative changes. IMPRESSION: No acute abnormality.  Stable cardiomegaly. Electronically Signed   By: Claudie Revering M.D.   On: 02/01/2018 18:44   Dg Knee Complete 4 Views Left  Result Date: 02/01/2018 CLINICAL DATA:  Fall.  Pain. EXAM: LEFT KNEE - COMPLETE 4+ VIEW COMPARISON:  None. FINDINGS: No evidence of fracture, or dislocation. Severe tricompartmental degenerative change. Osteopenia. No effusion. IMPRESSION: Severe DJD.  No fracture. Electronically Signed   By: Staci Righter M.D.   On: 02/01/2018 18:46    Procedures Procedures (including critical care time)  Medications Ordered in ED Medications  sodium chloride 0.9 % bolus 500 mL (has no administration in time range)  sodium chloride 0.9 % bolus 500 mL (0 mLs Intravenous Stopped 02/01/18 2104)     Initial Impression / Assessment and Plan / ED Course  I have reviewed the triage vital signs and the nursing notes.  Pertinent labs & imaging results that were available during my care of the patient  were reviewed by me and considered in my medical decision making (see chart for details).     Patient with nausea diarrhea and had some vomiting.  Has abdominal pain.  However has a persistent tachycardia.  Appears to be sinus however.  She has bumped her creatinine.  Has had some IV fluids and still has the tachycardia.  CT scan improved from prior.  States she had some blood in the stool but guaiac negative.  Chronic oxygen.  With all her comorbidities and the increased kidney function I feel the patient benefit from admission to the hospital.  Will discuss with hospitalist.  Final Clinical Impressions(s) / ED Diagnoses   Final diagnoses:  Dehydration  AKI (acute kidney injury) [facility])    ED Discharge Orders    None       Davonna Belling, MD 02/01/18 2226

## 2018-02-01 NOTE — ED Triage Notes (Signed)
Pt is presented from home c/o abdominal pain, N/V/D, anorexia and bilateral legs swelling, sent by home health for further eval.

## 2018-02-02 ENCOUNTER — Other Ambulatory Visit: Payer: Self-pay

## 2018-02-02 ENCOUNTER — Encounter (HOSPITAL_COMMUNITY): Payer: Self-pay | Admitting: Internal Medicine

## 2018-02-02 DIAGNOSIS — N183 Chronic kidney disease, stage 3 unspecified: Secondary | ICD-10-CM | POA: Diagnosis present

## 2018-02-02 DIAGNOSIS — R112 Nausea with vomiting, unspecified: Secondary | ICD-10-CM | POA: Diagnosis present

## 2018-02-02 DIAGNOSIS — R197 Diarrhea, unspecified: Secondary | ICD-10-CM | POA: Diagnosis not present

## 2018-02-02 LAB — BASIC METABOLIC PANEL
ANION GAP: 11 (ref 5–15)
BUN: 38 mg/dL — ABNORMAL HIGH (ref 8–23)
CALCIUM: 7.9 mg/dL — AB (ref 8.9–10.3)
CO2: 27 mmol/L (ref 22–32)
Chloride: 101 mmol/L (ref 98–111)
Creatinine, Ser: 3.8 mg/dL — ABNORMAL HIGH (ref 0.44–1.00)
GFR calc non Af Amer: 11 mL/min — ABNORMAL LOW (ref >60)
GFR, EST AFRICAN AMERICAN: 13 mL/min — AB (ref >60)
Glucose, Bld: 116 mg/dL — ABNORMAL HIGH (ref 70–99)
POTASSIUM: 4.2 mmol/L (ref 3.5–5.1)
SODIUM: 139 mmol/L (ref 135–145)

## 2018-02-02 LAB — CBC
HCT: 25.9 % — ABNORMAL LOW (ref 36.0–46.0)
HEMOGLOBIN: 7.8 g/dL — AB (ref 12.0–15.0)
MCH: 25.8 pg — AB (ref 26.0–34.0)
MCHC: 30.1 g/dL (ref 30.0–36.0)
MCV: 85.8 fL (ref 78.0–100.0)
PLATELETS: 296 10*3/uL (ref 150–400)
RBC: 3.02 MIL/uL — AB (ref 3.87–5.11)
RDW: 15.9 % — ABNORMAL HIGH (ref 11.5–15.5)
WBC: 7.9 10*3/uL (ref 4.0–10.5)

## 2018-02-02 LAB — GLUCOSE, CAPILLARY
GLUCOSE-CAPILLARY: 115 mg/dL — AB (ref 70–99)
GLUCOSE-CAPILLARY: 108 mg/dL — AB (ref 70–99)
GLUCOSE-CAPILLARY: 104 mg/dL — AB (ref 70–99)
GLUCOSE-CAPILLARY: 112 mg/dL — AB (ref 70–99)
GLUCOSE-CAPILLARY: 121 mg/dL — AB (ref 70–99)

## 2018-02-02 MED ORDER — LEVOTHYROXINE SODIUM 50 MCG PO TABS
50.0000 ug | ORAL_TABLET | Freq: Every day | ORAL | Status: DC
  Administered 2018-02-02 – 2018-02-05 (×3): 50 ug via ORAL
  Filled 2018-02-02 (×4): qty 1

## 2018-02-02 MED ORDER — GUAIFENESIN ER 600 MG PO TB12
600.0000 mg | ORAL_TABLET | Freq: Two times a day (BID) | ORAL | Status: DC
  Administered 2018-02-02 – 2018-02-05 (×2): 600 mg via ORAL
  Filled 2018-02-02 (×7): qty 1

## 2018-02-02 MED ORDER — MELATONIN 3 MG PO TABS
3.0000 mg | ORAL_TABLET | Freq: Every day | ORAL | Status: DC
  Administered 2018-02-04: 3 mg via ORAL
  Filled 2018-02-02 (×4): qty 1

## 2018-02-02 MED ORDER — FLUTICASONE PROPIONATE 50 MCG/ACT NA SUSP
1.0000 | Freq: Two times a day (BID) | NASAL | Status: DC
  Administered 2018-02-02 – 2018-02-05 (×2): 1 via NASAL
  Filled 2018-02-02: qty 16

## 2018-02-02 MED ORDER — METOPROLOL TARTRATE 12.5 MG HALF TABLET
12.5000 mg | ORAL_TABLET | Freq: Two times a day (BID) | ORAL | Status: DC
  Administered 2018-02-02 – 2018-02-05 (×7): 12.5 mg via ORAL
  Filled 2018-02-02 (×7): qty 1

## 2018-02-02 MED ORDER — DOCUSATE SODIUM 100 MG PO CAPS
100.0000 mg | ORAL_CAPSULE | ORAL | Status: DC
  Administered 2018-02-02: 100 mg via ORAL
  Filled 2018-02-02 (×3): qty 1

## 2018-02-02 MED ORDER — CYCLOBENZAPRINE HCL 10 MG PO TABS: 10.0000 mg | ORAL_TABLET | Freq: Four times a day (QID) | ORAL | Status: DC | PRN

## 2018-02-02 MED ORDER — SACCHAROMYCES BOULARDII 250 MG PO CAPS
250.0000 mg | ORAL_CAPSULE | Freq: Three times a day (TID) | ORAL | Status: DC
  Administered 2018-02-02 – 2018-02-05 (×7): 250 mg via ORAL
  Filled 2018-02-02 (×9): qty 1

## 2018-02-02 MED ORDER — ACETAMINOPHEN 650 MG RE SUPP: 650.0000 mg | Freq: Four times a day (QID) | RECTAL | Status: DC | PRN

## 2018-02-02 MED ORDER — INSULIN ASPART 100 UNIT/ML ~~LOC~~ SOLN: 0.0000 [IU] | Freq: Three times a day (TID) | SUBCUTANEOUS | Status: DC

## 2018-02-02 MED ORDER — ACETAMINOPHEN 325 MG PO TABS: 650.0000 mg | ORAL_TABLET | Freq: Four times a day (QID) | ORAL | Status: DC | PRN

## 2018-02-02 MED ORDER — ONDANSETRON HCL 4 MG PO TABS
4.0000 mg | ORAL_TABLET | Freq: Four times a day (QID) | ORAL | Status: DC | PRN
  Administered 2018-02-03: 4 mg via ORAL
  Filled 2018-02-02: qty 1

## 2018-02-02 MED ORDER — AMLODIPINE BESYLATE 5 MG PO TABS
5.0000 mg | ORAL_TABLET | Freq: Every day | ORAL | Status: DC
  Administered 2018-02-02 – 2018-02-05 (×4): 5 mg via ORAL
  Filled 2018-02-02 (×4): qty 1

## 2018-02-02 MED ORDER — LORATADINE 10 MG PO TABS
10.0000 mg | ORAL_TABLET | Freq: Every day | ORAL | Status: DC
  Administered 2018-02-02 – 2018-02-05 (×2): 10 mg via ORAL
  Filled 2018-02-02 (×4): qty 1

## 2018-02-02 MED ORDER — ONDANSETRON HCL 4 MG/2ML IJ SOLN
4.0000 mg | Freq: Four times a day (QID) | INTRAMUSCULAR | Status: DC | PRN
  Administered 2018-02-02: 4 mg via INTRAVENOUS
  Filled 2018-02-02: qty 2

## 2018-02-02 MED ORDER — HEPARIN SODIUM (PORCINE) 5000 UNIT/ML IJ SOLN
5000.0000 [IU] | Freq: Three times a day (TID) | INTRAMUSCULAR | Status: DC
  Administered 2018-02-02 – 2018-02-05 (×8): 5000 [IU] via SUBCUTANEOUS
  Filled 2018-02-02 (×10): qty 1

## 2018-02-02 MED ORDER — AMIODARONE HCL 200 MG PO TABS
200.0000 mg | ORAL_TABLET | Freq: Every day | ORAL | Status: DC
Start: 2018-02-02 — End: 2018-02-06
  Administered 2018-02-02 – 2018-02-05 (×3): 200 mg via ORAL
  Filled 2018-02-02 (×5): qty 1

## 2018-02-02 MED ORDER — BUMETANIDE 1 MG PO TABS
2.0000 mg | ORAL_TABLET | Freq: Every day | ORAL | Status: DC
  Filled 2018-02-02: qty 2

## 2018-02-02 NOTE — ED Notes (Signed)
Hospitalist at bedside 

## 2018-02-02 NOTE — H&P (Signed)
History and Physical    Jodi Underwood HQI:[id number] DOB: 02/17/1946 DOA: 02/01/2018  PCP: Jodi Marble, MD   Patient coming from: Home.  Chief Complaint: Vomiting and diarrhea.  HPI: Jodi Underwood is a 72 y.o. female with history of hypertension, CHF, hypothyroidism, atrial fibrillation, anemia, chronic kidney disease stage III-IV who was recently admitted for renal failure last month at the time patient also had sigmoid diverticulitis was eventually discharged to rehab when patient states 2 days ago she has been discharged from rehab to home.  Over the last 24 hours patient has been having multiple episodes of nausea vomiting and diarrhea.  Has been having epigastric discomfort.  Denies any chest pain or shortness of breath.  She did not notice some dark stools during diarrhea.  ED Course: In the ER patient had CT abdomen pelvis does not show anything acute.  Creatinine is increased from baseline 5.4.  Was given fluid bolus 1 L.  Patient last time I examined epigastric discomfort is resolved.  Patient admitted for further observation of worsening renal function with nausea vomiting and diarrhea.  Stool for occult blood was negative.  Review of Systems: As per HPI, rest all negative.   Past Medical History:  Diagnosis Date  . Anemia   . Arthritis   . Asthma   . Atrial fibrillation ([place])   . Degenerative disc disease   . Diabetes mellitus   . Diabetic neuropathy ([place])   . Edema extremities   . Hypertension   . Lymphedema   . Pneumonia   . Urinary incontinence     Past Surgical History:  Procedure Laterality Date  . ABDOMINAL HYSTERECTOMY     COMPLETE     reports that she has never smoked. She has never used smokeless tobacco. She reports that she drinks alcohol. She reports that she does not use drugs.  Allergies  Allergen Reactions  . Ciprofloxacin Other (See Comments)    Hallucinations    Family History  Adopted: Yes  Family history unknown: Yes     Prior to Admission medications   Medication Sig Start Date End Date Taking? Authorizing Provider  acetaminophen (TYLENOL) 325 MG tablet Take 650 mg by mouth every 6 (six) hours as needed (For pain.).    [provider]  albuterol (ACCUNEB) 0.63 MG/3ML nebulizer solution Take 1 ampule by nebulization every 6 (six) hours as needed for wheezing or shortness of breath.    [provider]  amiodarone (PACERONE) 200 MG tablet Take 200 mg by mouth daily. 08/25/17   [provider]  amLODipine (NORVASC) 5 MG tablet Take 5 mg by mouth daily.    [provider]  bumetanide (BUMEX) 2 MG tablet Take 2 mg by mouth daily. 11/09/17   [provider]  cetirizine (ZYRTEC ALLERGY) 10 MG tablet Take 10 mg by mouth daily.    [provider]  cyclobenzaprine (FLEXERIL) 10 MG tablet Take 10 mg by mouth every 6 (six) hours as needed for muscle spasms.    [provider]  docusate sodium (COLACE) 100 MG capsule Take 100 mg by mouth every other day.    [provider]  fluticasone (FLONASE) 50 MCG/ACT nasal spray Place 2 sprays into both nostrils daily. Patient taking differently: Place 1 spray into both nostrils every 12 (twelve) hours.  10/20/16   Doreatha Lew, MD  guaiFENesin (MUCINEX) 600 MG 12 hr tablet Take 600 mg by mouth every 12 (twelve) hours.    [provider]  levothyroxine (SYNTHROID, LEVOTHROID) 50 MCG tablet Take 50 mcg by mouth daily. 11/08/17   [provider]  Melatonin 3 MG TABS Take 3 mg by mouth at bedtime.    [provider]  metoprolol tartrate (LOPRESSOR) 25 MG tablet Take 0.5 tablets (12.5 mg total) by mouth 2 (two) times daily. 12/28/17   Alma Friendly, MD  montelukast (SINGULAIR) 10 MG tablet Take 10 mg by mouth daily. 08/24/17   [provider]  ondansetron (ZOFRAN) 8 MG tablet Take by mouth every 8 (eight) hours as needed for nausea or vomiting.    [provider]   potassium chloride (K-DUR) 10 MEQ tablet  01/25/18   [provider]  potassium chloride (K-DUR,KLOR-CON) 10 MEQ tablet Take 10 mEq by mouth 3 (three) times daily.    [provider]  promethazine (PHENERGAN) 25 MG tablet Take 25 mg by mouth every 6 (six) hours as needed for nausea or vomiting.    [provider]  senna (SENOKOT) 8.6 MG TABS tablet Take 1 tablet (8.6 mg total) by mouth daily. Patient taking differently: Take 1 tablet by mouth every other day.  06/17/17   Patrecia Pour, MD  simethicone (MYLICON) 80 MG chewable tablet Chew 80 mg by mouth every 6 (six) hours as needed for flatulence.    [provider]  traMADol (ULTRAM) 50 MG tablet Take 50 mg by mouth every 6 (six) hours as needed (For pain.).    [provider]    Physical Exam: Vitals:   02/01/18 2330 02/02/18 0000 02/02/18 0030 02/02/18 0100  BP: 127/60 121/65 134/73 125/68  Pulse: (!) 109 (!) 117 (!) 110 (!) 112  Resp: (!) 22 (!) 23 17 14   Temp:      TempSrc:      SpO2: 100% 100% 100% 98%      Constitutional: Moderately built and nourished. Vitals:   02/01/18 2330 02/02/18 0000 02/02/18 0030 02/02/18 0100  BP: 127/60 121/65 134/73 125/68  Pulse: (!) 109 (!) 117 (!) 110 (!) 112  Resp: (!) 22 (!) 23 17 14   Temp:      TempSrc:      SpO2: 100% 100% 100% 98%   Eyes: Anicteric no pallor. ENMT: No discharge from the ears eyes nose or mouth. Neck: No mass felt.  No neck rigidity.  No JVD appreciated. Respiratory: No rhonchi or crepitations. Cardiovascular: S1-S2 heard no murmurs appreciated. Abdomen: Soft nontender bowel sounds present. Musculoskeletal: Chronic bilateral lymphedema. Skin: Chronic skin changes of the lower extremity. Neurologic: Alert awake oriented to time place and person.  Has difficulty moving lower extremities chronically as per the patient.  Moves both upper extremities. Psychiatric: Appears normal per normal affect.   Labs on Admission: I have  personally reviewed following labs and imaging studies  CBC: Recent Labs  Lab 02/01/18 1732  WBC 7.6  NEUTROABS 2.8  HGB 8.8*  HCT 30.1*  MCV 86.2  PLT 993   Basic Metabolic Panel: Recent Labs  Lab 02/01/18 1732  NA 136  K 3.9  CL 98  CO2 29  GLUCOSE 126*  BUN 34*  CREATININE 3.60*  CALCIUM 8.1*   GFR: Estimated Creatinine Clearance: 22.7 mL/min (A) (by C-G formula based on SCr of 3.6 mg/dL (H)). Liver Function Tests: Recent Labs  Lab 02/01/18 1732  AST 20  ALT 12  ALKPHOS 75  BILITOT 0.6  PROT 6.6  ALBUMIN 2.2*   Recent Labs  Lab 02/01/18 1732  LIPASE 20   No  results for input(s): AMMONIA in the last 168 hours. Coagulation Profile: No results for input(s): INR, PROTIME in the last 168 hours. Cardiac Enzymes: No results for input(s): CKTOTAL, CKMB, CKMBINDEX, TROPONINI in the last 168 hours. BNP (last 3 results) No results for input(s): PROBNP in the last 8760 hours. HbA1C: No results for input(s): HGBA1C in the last 72 hours. CBG: No results for input(s): GLUCAP in the last 168 hours. Lipid Profile: No results for input(s): CHOL, HDL, LDLCALC, TRIG, CHOLHDL, LDLDIRECT in the last 72 hours. Thyroid Function Tests: No results for input(s): TSH, T4TOTAL, FREET4, T3FREE, THYROIDAB in the last 72 hours. Anemia Panel: No results for input(s): VITAMINB12, FOLATE, FERRITIN, TIBC, IRON, RETICCTPCT in the last 72 hours. Urine analysis:    Component Value Date/Time   COLORURINE AMBER (A) 02/01/2018 2141   APPEARANCEUR TURBID (A) 02/01/2018 2141   LABSPEC 1.014 02/01/2018 2141   PHURINE 5.0 02/01/2018 2141   GLUCOSEU NEGATIVE 02/01/2018 2141   HGBUR SMALL (A) 02/01/2018 2141   BILIRUBINUR NEGATIVE 02/01/2018 2141   KETONESUR NEGATIVE 02/01/2018 2141   PROTEINUR NEGATIVE 02/01/2018 2141   UROBILINOGEN 0.2 09/29/2014 1224   NITRITE NEGATIVE 02/01/2018 2141   LEUKOCYTESUR MODERATE (A) 02/01/2018 2141   Sepsis  Labs: @LABRCNTIP (procalcitonin:4,lacticidven:4) ) Recent Results (from the past 240 hour(s))  Urine culture     Status: None   Collection Time: 01/25/18  2:45 PM  Result Value Ref Range Status   Specimen Description URINE, CATHETERIZED  Final   Special Requests NONE  Final   Culture   Final    NO GROWTH Performed at [facility], [place] [address], [place], [place] [address]    Report Status 01/26/2018 FINAL  Final     Radiological Exams on Admission: Ct Abdomen Pelvis Wo Contrast  Result Date: 02/01/2018 CLINICAL DATA:  Abdominal pain with nausea, vomiting and diarrhea. EXAM: CT ABDOMEN AND PELVIS WITHOUT CONTRAST TECHNIQUE: Multidetector CT imaging of the abdomen and pelvis was performed following the standard protocol without IV contrast. COMPARISON:  CT abdomen pelvis 12/20/2017 FINDINGS: LOWER CHEST: Bibasilar atelectasis. HEPATOBILIARY: Normal hepatic contours and density. No intra- or extrahepatic biliary dilatation. Layering density in the gallbladder, likely sludge. PANCREAS: Normal parenchymal contours without ductal dilatation. No peripancreatic fluid collection. SPLEEN: Normal. ADRENALS/URINARY TRACT: --Adrenal glands: Normal. --Right kidney/ureter: Unchanged nonobstructive 3 mm upper renal pelvis stone. No hydronephrosis. --Left kidney/ureter: No hydronephrosis, nephroureterolithiasis, perinephric stranding or solid renal mass. --Urinary bladder: Normal for degree of distention STOMACH/BOWEL: --Stomach/Duodenum: No hiatal hernia or other gastric abnormality. Normal duodenal course. --Small bowel: No dilatation or inflammation. --Colon: There is persistent wall thickening of the ascending colon, likely due to underdistention, as there is no surrounding inflammatory stranding. Inflammation surrounding the descending and sigmoid colon has resolved. --Appendix: Not visualized. No right lower quadrant inflammation or free fluid. VASCULAR/LYMPHATIC: Normal course and caliber of the major  abdominal vessels. No abdominal or pelvic lymphadenopathy. REPRODUCTIVE: Status post hysterectomy. No adnexal mass. MUSCULOSKELETAL. Extensive degenerative disc disease and facet arthrosis with at least moderate L4-5 spinal canal stenosis. There is also spinal canal stenosis at the T12-L1 level. OTHER: Fat-containing paraumbilical hernia is unchanged in size, but no longer contains a loop of bowel. IMPRESSION: 1. Resolution of previously seen descending/sigmoid colon inflammation. 2. No acute abdominal or pelvic abnormality. 3. Moderate spinal canal stenosis at T12-L1 and L4-5. 4. Nonobstructive right nephrolithiasis. Electronically Signed   By: Ulyses Jarred M.D.   On: 02/01/2018 19:00   Dg Chest 2 View  Result Date: 02/01/2018  CLINICAL DATA:  Abdominal pain, nausea, vomiting and diarrhea. Bilateral lower extremity swelling. EXAM: CHEST - 2 VIEW COMPARISON:  01/25/2018. FINDINGS: Stable mildly enlarged cardiac silhouette. Stable linear scarring or atelectasis at the right lung base. Otherwise, clear lungs with normal vascularity. Thoracic spine degenerative changes. IMPRESSION: No acute abnormality.  Stable cardiomegaly. Electronically Signed   By: Claudie Revering M.D.   On: 02/01/2018 18:44   Dg Knee Complete 4 Views Left  Result Date: 02/01/2018 CLINICAL DATA:  Fall.  Pain. EXAM: LEFT KNEE - COMPLETE 4+ VIEW COMPARISON:  None. FINDINGS: No evidence of fracture, or dislocation. Severe tricompartmental degenerative change. Osteopenia. No effusion. IMPRESSION: Severe DJD.  No fracture. Electronically Signed   By: Staci Righter M.D.   On: 02/01/2018 18:46    EKG: Independently reviewed.  Sinus tachycardia.  Assessment/Plan Active Problems:   DM type 2, uncontrolled, with neuropathy (HCC)   Morbid obesity ([place])   Essential hypertension, benign   Lymphedema of both lower extremities   A-fib (HCC)   Chronic systolic CHF (congestive heart failure) (HCC)   Nausea vomiting and diarrhea   CKD (chronic  kidney disease) stage 3, GFR 30-59 ml/min (HCC)   Nausea and vomiting    1. Nausea vomiting diarrhea -self-limited at this time we will closely observe.  Any further episodes we will check stool studies.  CT abdomen and pelvis was unremarkable has had recent sigmoid diverticulitis last month. 2. Acute on chronic renal failure stage IV -was given 1 L fluid bolus.  Hold Bumex for now.  Closely observe intake output metabolic panel. 3. Chronic CHF holding Bumex due to worsening renal function. 4. Hypertension on metoprolol and Norvasc. 5. A. fib as per the chart.  Presently in sinus tachycardia.  Continue metoprolol not on any anticoagulation. 6. Chronic bilateral lower extremity lymphedema. 7. Morbid obesity and sleep apnea on CPAP.  Has not been nonambulatory recently. 8. Prediabetes/diabetes mellitus type 2 on sliding scale coverage. 9. 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   DVT prophylaxis: Heparin. Code Status: Full code. Family Communication: Discussed with patient. Disposition Plan: To be determined. Consults called: None. Admission status: Observation.   Rise Patience MD [facility] Pager [phone].  If 7PM-7AM, please contact night-coverage [email or web address] Password [email or web address]  02/02/2018, 2:11 AM

## 2018-02-02 NOTE — Plan of Care (Signed)
Patient admitted this morning with nausea vomiting diarrhea.continues to have profuse diarhea,no vomiting.will send stool cdiff stool panel.she has CKD STAGE 5.lives at home with family.

## 2018-02-02 NOTE — ED Notes (Signed)
ED TO INPATIENT HANDOFF REPORT  Name/Age/Gender Jodi Underwood 72 y.o. female  Code Status Code Status History    Date Active Date Inactive Code Status Order ID Comments User Context   12/20/2017 1400 12/28/2017 2138 Full Code [id number]  Shelly Coss, MD ED   06/11/2017 0325 06/16/2017 2051 Full Code [id number]  Norval Morton, MD ED   10/13/2016 2158 10/20/2016 2154 Full Code [id number]  Ivor Costa, MD ED   04/28/2016 0017 05/03/2016 2233 Full Code [id number]  Phillips Grout, MD Inpatient   09/26/2014 0227 10/04/2014 2006 Full Code [id number]  Theressa Millard, MD Inpatient   01/23/2013 2245 01/27/2013 2000 Full Code [id number]  Delfina Redwood, MD Inpatient      Home/SNF/Other Home  Chief Complaint abd pain   Level of Care/Admitting Diagnosis ED Disposition    ED Disposition Condition [facility] [[id number]]  Level of Care: Telemetry [5]  Admit to tele based on following criteria: Monitor for Ischemic changes  Diagnosis: Nausea vomiting and diarrhea [735329]  Admitting Physician: Rise Patience [phone]  Attending Physician: Rise Patience [phone]  PT Class (Do Not Modify): Observation [104]  PT Acc Code (Do Not Modify): Observation [10022]       Medical History Past Medical History:  Diagnosis Date  . Anemia   . Arthritis   . Asthma   . Atrial fibrillation ([place])   . Degenerative disc disease   . Diabetes mellitus   . Diabetic neuropathy ([place])   . Edema extremities   . Hypertension   . Lymphedema   . Pneumonia   . Urinary incontinence     Allergies Allergies  Allergen Reactions  . Ciprofloxacin Other (See Comments)    Hallucinations    IV Location/Drains/Wounds Patient Lines/Drains/Airways Status   Active Line/Drains/Airways    Name:   Placement date:   Placement time:   Site:   Days:   Peripheral IV 02/01/18 Right Antecubital   02/01/18    1756    Antecubital   1           Labs/Imaging Results for orders placed or performed during the hospital encounter of 02/01/18 (from the past 48 hour(s))  Comprehensive metabolic panel     Status: Abnormal   Collection Time: 02/01/18  5:32 PM  Result Value Ref Range   Sodium 136 135 - 145 mmol/L   Potassium 3.9 3.5 - 5.1 mmol/L   Chloride 98 98 - 111 mmol/L    Comment: Please note change in reference range.   CO2 29 22 - 32 mmol/L   Glucose, Bld 126 (H) 70 - 99 mg/dL    Comment: Please note change in reference range.   BUN 34 (H) 8 - 23 mg/dL    Comment: Please note change in reference range.   Creatinine, Ser 3.60 (H) 0.44 - 1.00 mg/dL   Calcium 8.1 (L) 8.9 - 10.3 mg/dL   Total Protein 6.6 6.5 - 8.1 g/dL   Albumin 2.2 (L) 3.5 - 5.0 g/dL   AST 20 15 - 41 U/L   ALT 12 0 - 44 U/L    Comment: Please note change in reference range.   Alkaline Phosphatase 75 38 - 126 U/L   Total Bilirubin 0.6 0.3 - 1.2 mg/dL   GFR calc non Af Amer 12 (L) >60 mL/min   GFR calc Af Amer 14 (L) >60 mL/min    Comment: (NOTE) The eGFR has been calculated using the  CKD EPI equation. This calculation has not been validated in all clinical situations. eGFR's persistently <60 mL/min signify possible Chronic Kidney Disease.    Anion gap 9 5 - 15    Comment: Performed at [facility], [place] [address], [place], [place] [address]  Lipase, blood     Status: None   Collection Time: 02/01/18  5:32 PM  Result Value Ref Range   Lipase 20 11 - 51 U/L    Comment: Performed at [facility], [place] [address] [place], [place] [address]  CBC with Differential     Status: Abnormal   Collection Time: 02/01/18  5:32 PM  Result Value Ref Range   WBC 7.6 4.0 - 10.5 K/uL   RBC 3.49 (L) 3.87 - 5.11 MIL/uL   Hemoglobin 8.8 (L) 12.0 - 15.0 g/dL   HCT 30.1 (L) 36.0 - 46.0 %   MCV 86.2 78.0 - 100.0 fL   MCH 25.2 (L) 26.0 - 34.0 pg   MCHC 29.2 (L) 30.0 - 36.0 g/dL   RDW 15.9 (H) 11.5 - 15.5 %   Platelets 377 150 -  400 K/uL   Neutrophils Relative % 37 %   Neutro Abs 2.8 1.7 - 7.7 K/uL   Lymphocytes Relative 30 %   Lymphs Abs 2.3 0.7 - 4.0 K/uL   Monocytes Relative 32 %   Monocytes Absolute 2.4 (H) 0.1 - 1.0 K/uL   Eosinophils Relative 1 %   Eosinophils Absolute 0.1 0.0 - 0.7 K/uL   Basophils Relative 0 %   Basophils Absolute 0.0 0.0 - 0.1 K/uL    Comment: Performed at [facility], [place] [address], [place], [place] [address]  Urinalysis, Routine w reflex microscopic     Status: Abnormal   Collection Time: 02/01/18  9:41 PM  Result Value Ref Range   Color, Urine AMBER (A) YELLOW    Comment: BIOCHEMICALS MAY BE AFFECTED BY COLOR   APPearance TURBID (A) CLEAR   Specific Gravity, Urine 1.014 1.005 - 1.030   pH 5.0 5.0 - 8.0   Glucose, UA NEGATIVE NEGATIVE mg/dL   Hgb urine dipstick SMALL (A) NEGATIVE   Bilirubin Urine NEGATIVE NEGATIVE   Ketones, ur NEGATIVE NEGATIVE mg/dL   Protein, ur NEGATIVE NEGATIVE mg/dL   Nitrite NEGATIVE NEGATIVE   Leukocytes, UA MODERATE (A) NEGATIVE   RBC / HPF 6-10 0 - 5 RBC/hpf   WBC, UA >50 (H) 0 - 5 WBC/hpf   Bacteria, UA MANY (A) NONE SEEN   Squamous Epithelial / LPF 21-50 0 - 5   WBC Clumps PRESENT    Mucus PRESENT     Comment: Performed at [facility], [place] [address], [place],  [address]  POC occult blood, ED     Status: None   Collection Time: 02/01/18 10:09 PM  Result Value Ref Range   Fecal Occult Bld NEGATIVE NEGATIVE   Ct Abdomen Pelvis Wo Contrast  Result Date: 02/01/2018 CLINICAL DATA:  Abdominal pain with nausea, vomiting and diarrhea. EXAM: CT ABDOMEN AND PELVIS WITHOUT CONTRAST TECHNIQUE: Multidetector CT imaging of the abdomen and pelvis was performed following the standard protocol without IV contrast. COMPARISON:  CT abdomen pelvis 12/20/2017 FINDINGS: LOWER CHEST: Bibasilar atelectasis. HEPATOBILIARY: Normal hepatic contours and density. No intra- or extrahepatic biliary dilatation. Layering density  in the gallbladder, likely sludge. PANCREAS: Normal parenchymal contours without ductal dilatation. No peripancreatic fluid collection. SPLEEN: Normal. ADRENALS/URINARY TRACT: --Adrenal glands: Normal. --Right kidney/ureter: Unchanged nonobstructive 3 mm upper  renal pelvis stone. No hydronephrosis. --Left kidney/ureter: No hydronephrosis, nephroureterolithiasis, perinephric stranding or solid renal mass. --Urinary bladder: Normal for degree of distention STOMACH/BOWEL: --Stomach/Duodenum: No hiatal hernia or other gastric abnormality. Normal duodenal course. --Small bowel: No dilatation or inflammation. --Colon: There is persistent wall thickening of the ascending colon, likely due to underdistention, as there is no surrounding inflammatory stranding. Inflammation surrounding the descending and sigmoid colon has resolved. --Appendix: Not visualized. No right lower quadrant inflammation or free fluid. VASCULAR/LYMPHATIC: Normal course and caliber of the major abdominal vessels. No abdominal or pelvic lymphadenopathy. REPRODUCTIVE: Status post hysterectomy. No adnexal mass. MUSCULOSKELETAL. Extensive degenerative disc disease and facet arthrosis with at least moderate L4-5 spinal canal stenosis. There is also spinal canal stenosis at the T12-L1 level. OTHER: Fat-containing paraumbilical hernia is unchanged in size, but no longer contains a loop of bowel. IMPRESSION: 1. Resolution of previously seen descending/sigmoid colon inflammation. 2. No acute abdominal or pelvic abnormality. 3. Moderate spinal canal stenosis at T12-L1 and L4-5. 4. Nonobstructive right nephrolithiasis. Electronically Signed   By: Ulyses Jarred M.D.   On: 02/01/2018 19:00   Dg Chest 2 View  Result Date: 02/01/2018 CLINICAL DATA:  Abdominal pain, nausea, vomiting and diarrhea. Bilateral lower extremity swelling. EXAM: CHEST - 2 VIEW COMPARISON:  01/25/2018. FINDINGS: Stable mildly enlarged cardiac silhouette. Stable linear scarring or  atelectasis at the right lung base. Otherwise, clear lungs with normal vascularity. Thoracic spine degenerative changes. IMPRESSION: No acute abnormality.  Stable cardiomegaly. Electronically Signed   By: Claudie Revering M.D.   On: 02/01/2018 18:44   Dg Knee Complete 4 Views Left  Result Date: 02/01/2018 CLINICAL DATA:  Fall.  Pain. EXAM: LEFT KNEE - COMPLETE 4+ VIEW COMPARISON:  None. FINDINGS: No evidence of fracture, or dislocation. Severe tricompartmental degenerative change. Osteopenia. No effusion. IMPRESSION: Severe DJD.  No fracture. Electronically Signed   By: Staci Righter M.D.   On: 02/01/2018 18:46    Pending Labs Unresulted Labs (From admission, onward)   None      Vitals/Pain Today's Vitals   02/01/18 2100 02/01/18 2130 02/01/18 2141 02/01/18 2200  BP: 123/65 126/65 126/65 120/63  Pulse: (!) 108 (!) 111 (!) 115 (!) 112  Resp: 15 17 14  (!) 21  Temp:      TempSrc:      SpO2: 98% 98% 99% 98%  PainSc:        Isolation Precautions No active isolations  Medications Medications  sodium chloride 0.9 % bolus 500 mL (0 mLs Intravenous Stopped 02/01/18 2104)  sodium chloride 0.9 % bolus 500 mL (500 mLs Intravenous New Bag/Given 02/01/18 2227)    Mobility non-ambulatory

## 2018-02-02 NOTE — Progress Notes (Signed)
CPAP device offered to patient as ordered.  Pt. Stated that she did not want the she "has problems" with the device and will not wear.  RN notified of patients refusal.  Patient aware that if she changes her mind she can call and request machine.

## 2018-02-03 DIAGNOSIS — I5022 Chronic systolic (congestive) heart failure: Secondary | ICD-10-CM | POA: Diagnosis not present

## 2018-02-03 DIAGNOSIS — N179 Acute kidney failure, unspecified: Secondary | ICD-10-CM | POA: Diagnosis not present

## 2018-02-03 DIAGNOSIS — I480 Paroxysmal atrial fibrillation: Secondary | ICD-10-CM

## 2018-02-03 DIAGNOSIS — E860 Dehydration: Secondary | ICD-10-CM | POA: Diagnosis not present

## 2018-02-03 LAB — GLUCOSE, CAPILLARY
GLUCOSE-CAPILLARY: 105 mg/dL — AB (ref 70–99)
Glucose-Capillary: 101 mg/dL — ABNORMAL HIGH (ref 70–99)
Glucose-Capillary: 109 mg/dL — ABNORMAL HIGH (ref 70–99)
Glucose-Capillary: 122 mg/dL — ABNORMAL HIGH (ref 70–99)

## 2018-02-03 LAB — BASIC METABOLIC PANEL
Anion gap: 8 (ref 5–15)
BUN: 39 mg/dL — AB (ref 8–23)
CO2: 29 mmol/L (ref 22–32)
Calcium: 8 mg/dL — ABNORMAL LOW (ref 8.9–10.3)
Chloride: 102 mmol/L (ref 98–111)
Creatinine, Ser: 3.57 mg/dL — ABNORMAL HIGH (ref 0.44–1.00)
GFR calc Af Amer: 14 mL/min — ABNORMAL LOW (ref >60)
GFR, EST NON AFRICAN AMERICAN: 12 mL/min — AB (ref >60)
GLUCOSE: 108 mg/dL — AB (ref 70–99)
POTASSIUM: 3.8 mmol/L (ref 3.5–5.1)
Sodium: 139 mmol/L (ref 135–145)

## 2018-02-03 MED ORDER — CHOLESTYRAMINE LIGHT 4 G PO PACK
4.0000 g | PACK | Freq: Three times a day (TID) | ORAL | Status: DC
  Filled 2018-02-03 (×8): qty 1

## 2018-02-03 NOTE — Progress Notes (Signed)
Pt does not want to wear hospital-provided cpap.  Machine not in room.  Rx changed to prn.  RT to assist as needed.

## 2018-02-03 NOTE — Progress Notes (Signed)
PROGRESS NOTE    Jodi Underwood  OBS:[id number] DOB: [birth year] DOA: 02/01/2018 PCP: Jodi Marble, MD  Brief Narrative:  71 y.o. female with history of hypertension, CHF, hypothyroidism, atrial fibrillation, anemia, chronic kidney disease stage III-IV who was recently admitted for renal failure last month at the time patient also had sigmoid diverticulitis was eventually discharged to rehab when patient states 2 days ago she has been discharged from rehab to home.  Over the last 24 hours patient has been having multiple episodes of nausea vomiting and diarrhea.  Has been having epigastric discomfort.  Denies any chest pain or shortness of breath.  She did not notice some dark stools during diarrhea.  ED Course: In the ER patient had CT abdomen pelvis does not show anything acute.  Creatinine is increased from baseline 5.4.  Was given fluid bolus 1 L.  Patient last time I examined epigastric discomfort is resolved.  Patient admitted for further observation of worsening renal function with nausea vomiting and diarrhea.  Stool for occult blood was negative.    Assessment & Plan:   Active Problems:   DM type 2, uncontrolled, with neuropathy ([facility])   Morbid obesity ([facility])   Essential hypertension, benign   Lymphedema of both lower extremities   A-fib (HCC)   Chronic systolic CHF (congestive heart failure) (HCC)   Nausea vomiting and diarrhea   CKD (chronic kidney disease) stage 3, GFR 30-59 ml/min (HCC)   Nausea and vomiting  1] gastroenteritis-vomiting has been resolved patient has had no diarrhea since last night.  She is able to tolerate p.o. intake.  Stool studies have been negative.  CT of the abdomen and pelvis unremarkable.  2] acute on chronic kidney disease stage IV status post food bolus improved.  Patient reports that she was not following up with nephrologist as an outpatient on a regular basis.  She knows now that she must follow-up with them once discharged from  here.  3]chf stable.  Restart Bumex tomorrow.  4] hypertension stable on Norvasc and metoprolol.  5] obstructive sleep apnea patient refused CPAP last night.  She did have oxygen at 2 L 24/7 at home.  6] chronic bilateral lower extremity lymphedema stable    DVT prophylaxis: heparin Code Status: full Family Communication:none Disposition Plan: Plan DC tomorrow if remains stable overnight Consultants: None  Procedures: None Antimicrobials none  Subjective: Feels better.  Alert  Objective: Vitals:   02/02/18 0500 02/02/18 1416 02/02/18 2100 02/03/18 0636  BP:  (!) 109/59 (!) 136/55 (!) 130/56  Pulse:  87 95 91  Resp:  14 15 16   Temp:  98 F (36.7 C) 98.3 F (36.8 C) 98.3 F (36.8 C)  TempSrc:  Oral Oral Oral  SpO2:  100% 100% 96%  Weight: (!) 150.6 kg (332 lb 1.6 oz)     Height: 5\' 6"  (1.676 m)       Intake/Output Summary (Last 24 hours) at 02/03/2018 1143 Last data filed at 02/03/2018 0245 Gross per 24 hour  Intake -  Output 450 ml  Net -450 ml   Filed Weights   02/02/18 0500  Weight: (!) 150.6 kg (332 lb 1.6 oz)    Examination:  General exam: Appears calm and comfortable  Respiratory system: Clear to auscultation. Respiratory effort normal. Cardiovascular system: S1 & S2 heard, RRR. No JVD, murmurs, rubs, gallops or clicks. No pedal edema. Gastrointestinal system: Abdomen is nondistended, soft and nontender. No organomegaly or masses felt. Normal bowel sounds heard. Central nervous system: Alert  and oriented. No focal neurological deficits. Extremities: NON piting edema Skin: No rashes, lesions or ulcers Psychiatry: Judgement and insight appear normal. Mood & affect appropriate.     Data Reviewed: I have personally reviewed following labs and imaging studies  CBC: Recent Labs  Lab 02/01/18 1732 02/02/18 0549  WBC 7.6 7.9  NEUTROABS 2.8  --   HGB 8.8* 7.8*  HCT 30.1* 25.9*  MCV 86.2 85.8  PLT 377 676   Basic Metabolic Panel: Recent Labs   Lab 02/01/18 1732 02/02/18 0549 02/03/18 0616  NA 136 139 139  K 3.9 4.2 3.8  CL 98 101 102  CO2 29 27 29   GLUCOSE 126* 116* 108*  BUN 34* 38* 39*  CREATININE 3.60* 3.80* 3.57*  CALCIUM 8.1* 7.9* 8.0*   GFR: Estimated Creatinine Clearance: 21.5 mL/min (A) (by C-G formula based on SCr of 3.57 mg/dL (H)). Liver Function Tests: Recent Labs  Lab 02/01/18 1732  AST 20  ALT 12  ALKPHOS 75  BILITOT 0.6  PROT 6.6  ALBUMIN 2.2*   Recent Labs  Lab 02/01/18 1732  LIPASE 20   No results for input(s): AMMONIA in the last 168 hours. Coagulation Profile: No results for input(s): INR, PROTIME in the last 168 hours. Cardiac Enzymes: No results for input(s): CKTOTAL, CKMB, CKMBINDEX, TROPONINI in the last 168 hours. BNP (last 3 results) No results for input(s): PROBNP in the last 8760 hours. HbA1C: No results for input(s): HGBA1C in the last 72 hours. CBG: Recent Labs  Lab 02/02/18 0757 02/02/18 1311 02/02/18 1721 02/02/18 2103 02/03/18 0755  GLUCAP 108* 104* 112* 121* 101*   Lipid Profile: No results for input(s): CHOL, HDL, LDLCALC, TRIG, CHOLHDL, LDLDIRECT in the last 72 hours. Thyroid Function Tests: No results for input(s): TSH, T4TOTAL, FREET4, T3FREE, THYROIDAB in the last 72 hours. Anemia Panel: No results for input(s): VITAMINB12, FOLATE, FERRITIN, TIBC, IRON, RETICCTPCT in the last 72 hours. Sepsis Labs: No results for input(s): PROCALCITON, LATICACIDVEN in the last 168 hours.  Recent Results (from the past 240 hour(s))  Urine culture     Status: None   Collection Time: 01/25/18  2:45 PM  Result Value Ref Range Status   Specimen Description URINE, CATHETERIZED  Final   Special Requests NONE  Final   Culture   Final    NO GROWTH Performed at [facility], [address] [place], [place] [address]    Report Status 01/26/2018 FINAL  Final         Radiology Studies: Ct Abdomen Pelvis Wo Contrast  Result Date: 02/01/2018 CLINICAL DATA:   Abdominal pain with nausea, vomiting and diarrhea. EXAM: CT ABDOMEN AND PELVIS WITHOUT CONTRAST TECHNIQUE: Multidetector CT imaging of the abdomen and pelvis was performed following the standard protocol without IV contrast. COMPARISON:  CT abdomen pelvis 12/20/2017 FINDINGS: LOWER CHEST: Bibasilar atelectasis. HEPATOBILIARY: Normal hepatic contours and density. No intra- or extrahepatic biliary dilatation. Layering density in the gallbladder, likely sludge. PANCREAS: Normal parenchymal contours without ductal dilatation. No peripancreatic fluid collection. SPLEEN: Normal. ADRENALS/URINARY TRACT: --Adrenal glands: Normal. --Right kidney/ureter: Unchanged nonobstructive 3 mm upper renal pelvis stone. No hydronephrosis. --Left kidney/ureter: No hydronephrosis, nephroureterolithiasis, perinephric stranding or solid renal mass. --Urinary bladder: Normal for degree of distention STOMACH/BOWEL: --Stomach/Duodenum: No hiatal hernia or other gastric abnormality. Normal duodenal course. --Small bowel: No dilatation or inflammation. --Colon: There is persistent wall thickening of the ascending colon, likely due to underdistention, as there is no surrounding inflammatory stranding. Inflammation surrounding the descending and sigmoid  colon has resolved. --Appendix: Not visualized. No right lower quadrant inflammation or free fluid. VASCULAR/LYMPHATIC: Normal course and caliber of the major abdominal vessels. No abdominal or pelvic lymphadenopathy. REPRODUCTIVE: Status post hysterectomy. No adnexal mass. MUSCULOSKELETAL. Extensive degenerative disc disease and facet arthrosis with at least moderate L4-5 spinal canal stenosis. There is also spinal canal stenosis at the T12-L1 level. OTHER: Fat-containing paraumbilical hernia is unchanged in size, but no longer contains a loop of bowel. IMPRESSION: 1. Resolution of previously seen descending/sigmoid colon inflammation. 2. No acute abdominal or pelvic abnormality. 3. Moderate  spinal canal stenosis at T12-L1 and L4-5. 4. Nonobstructive right nephrolithiasis. Electronically Signed   By: Ulyses Jarred M.D.   On: 02/01/2018 19:00   Dg Chest 2 View  Result Date: 02/01/2018 CLINICAL DATA:  Abdominal pain, nausea, vomiting and diarrhea. Bilateral lower extremity swelling. EXAM: CHEST - 2 VIEW COMPARISON:  01/25/2018. FINDINGS: Stable mildly enlarged cardiac silhouette. Stable linear scarring or atelectasis at the right lung base. Otherwise, clear lungs with normal vascularity. Thoracic spine degenerative changes. IMPRESSION: No acute abnormality.  Stable cardiomegaly. Electronically Signed   By: Claudie Revering M.D.   On: 02/01/2018 18:44   Dg Knee Complete 4 Views Left  Result Date: 02/01/2018 CLINICAL DATA:  Fall.  Pain. EXAM: LEFT KNEE - COMPLETE 4+ VIEW COMPARISON:  None. FINDINGS: No evidence of fracture, or dislocation. Severe tricompartmental degenerative change. Osteopenia. No effusion. IMPRESSION: Severe DJD.  No fracture. Electronically Signed   By: Staci Righter M.D.   On: 02/01/2018 18:46        Scheduled Meds: . amiodarone  200 mg Oral Daily  . amLODipine  5 mg Oral Daily  . docusate sodium  100 mg Oral QODAY  . fluticasone  1 spray Each Nare Q12H  . guaiFENesin  600 mg Oral Q12H  . heparin  5,000 Units Subcutaneous Q8H  . insulin aspart  0-9 Units Subcutaneous TID WC  . levothyroxine  50 mcg Oral QAC breakfast  . loratadine  10 mg Oral Daily  . Melatonin  3 mg Oral QHS  . metoprolol tartrate  12.5 mg Oral BID  . saccharomyces boulardii  250 mg Oral TID   Continuous Infusions:   LOS: 0 days     Georgette Shell, MD [facility]  If 7PM-7AM, please contact night-coverage [email or web address] Password [facility] 02/03/2018, 11:43 AM

## 2018-02-03 NOTE — Progress Notes (Signed)
PT Cancellation Note  Patient Details Name: Jodi Underwood MRN: [id number] DOB: 05-26-1946   Cancelled Treatment:    Reason Eval/Treat Not Completed: Medical issues which prohibited therapy(pt reports she's nauseous and movement makes it worse, she declined PT. Will check back tomorrow. )   Philomena Doheny 02/03/2018, 12:47 PM [phone]

## 2018-02-04 DIAGNOSIS — E860 Dehydration: Secondary | ICD-10-CM | POA: Diagnosis present

## 2018-02-04 DIAGNOSIS — Z888 Allergy status to other drugs, medicaments and biological substances status: Secondary | ICD-10-CM | POA: Diagnosis not present

## 2018-02-04 DIAGNOSIS — E1165 Type 2 diabetes mellitus with hyperglycemia: Secondary | ICD-10-CM

## 2018-02-04 DIAGNOSIS — I4891 Unspecified atrial fibrillation: Secondary | ICD-10-CM | POA: Diagnosis present

## 2018-02-04 DIAGNOSIS — N184 Chronic kidney disease, stage 4 (severe): Secondary | ICD-10-CM | POA: Diagnosis present

## 2018-02-04 DIAGNOSIS — I5022 Chronic systolic (congestive) heart failure: Secondary | ICD-10-CM | POA: Diagnosis present

## 2018-02-04 DIAGNOSIS — I480 Paroxysmal atrial fibrillation: Secondary | ICD-10-CM | POA: Diagnosis not present

## 2018-02-04 DIAGNOSIS — K529 Noninfective gastroenteritis and colitis, unspecified: Secondary | ICD-10-CM | POA: Diagnosis present

## 2018-02-04 DIAGNOSIS — I130 Hypertensive heart and chronic kidney disease with heart failure and stage 1 through stage 4 chronic kidney disease, or unspecified chronic kidney disease: Secondary | ICD-10-CM | POA: Diagnosis present

## 2018-02-04 DIAGNOSIS — Z6841 Body Mass Index (BMI) 40.0 and over, adult: Secondary | ICD-10-CM | POA: Diagnosis not present

## 2018-02-04 DIAGNOSIS — D631 Anemia in chronic kidney disease: Secondary | ICD-10-CM | POA: Diagnosis present

## 2018-02-04 DIAGNOSIS — Z9981 Dependence on supplemental oxygen: Secondary | ICD-10-CM | POA: Diagnosis not present

## 2018-02-04 DIAGNOSIS — R000 Tachycardia, unspecified: Secondary | ICD-10-CM | POA: Diagnosis present

## 2018-02-04 DIAGNOSIS — Z5320 Procedure and treatment not carried out because of patient's decision for unspecified reasons: Secondary | ICD-10-CM | POA: Diagnosis not present

## 2018-02-04 DIAGNOSIS — Z79890 Hormone replacement therapy (postmenopausal): Secondary | ICD-10-CM | POA: Diagnosis not present

## 2018-02-04 DIAGNOSIS — J45909 Unspecified asthma, uncomplicated: Secondary | ICD-10-CM | POA: Diagnosis present

## 2018-02-04 DIAGNOSIS — E039 Hypothyroidism, unspecified: Secondary | ICD-10-CM | POA: Diagnosis present

## 2018-02-04 DIAGNOSIS — E1140 Type 2 diabetes mellitus with diabetic neuropathy, unspecified: Secondary | ICD-10-CM

## 2018-02-04 DIAGNOSIS — N183 Chronic kidney disease, stage 3 (moderate): Secondary | ICD-10-CM

## 2018-02-04 DIAGNOSIS — N179 Acute kidney failure, unspecified: Secondary | ICD-10-CM | POA: Diagnosis present

## 2018-02-04 DIAGNOSIS — Z79899 Other long term (current) drug therapy: Secondary | ICD-10-CM | POA: Diagnosis not present

## 2018-02-04 DIAGNOSIS — I10 Essential (primary) hypertension: Secondary | ICD-10-CM | POA: Diagnosis not present

## 2018-02-04 DIAGNOSIS — G4733 Obstructive sleep apnea (adult) (pediatric): Secondary | ICD-10-CM | POA: Diagnosis present

## 2018-02-04 DIAGNOSIS — I890 Lymphedema, not elsewhere classified: Secondary | ICD-10-CM | POA: Diagnosis present

## 2018-02-04 DIAGNOSIS — M25562 Pain in left knee: Secondary | ICD-10-CM | POA: Diagnosis present

## 2018-02-04 DIAGNOSIS — E1122 Type 2 diabetes mellitus with diabetic chronic kidney disease: Secondary | ICD-10-CM | POA: Diagnosis present

## 2018-02-04 LAB — CBC
HEMATOCRIT: 25.7 % — AB (ref 36.0–46.0)
HEMOGLOBIN: 7.7 g/dL — AB (ref 12.0–15.0)
MCH: 25.6 pg — AB (ref 26.0–34.0)
MCHC: 30 g/dL (ref 30.0–36.0)
MCV: 85.4 fL (ref 78.0–100.0)
Platelets: 426 10*3/uL — ABNORMAL HIGH (ref 150–400)
RBC: 3.01 MIL/uL — ABNORMAL LOW (ref 3.87–5.11)
RDW: 16 % — ABNORMAL HIGH (ref 11.5–15.5)
WBC: 8.3 10*3/uL (ref 4.0–10.5)

## 2018-02-04 LAB — BASIC METABOLIC PANEL WITH GFR
Anion gap: 10 (ref 5–15)
BUN: 37 mg/dL — ABNORMAL HIGH (ref 8–23)
CO2: 28 mmol/L (ref 22–32)
Calcium: 7.9 mg/dL — ABNORMAL LOW (ref 8.9–10.3)
Chloride: 99 mmol/L (ref 98–111)
Creatinine, Ser: 3.12 mg/dL — ABNORMAL HIGH (ref 0.44–1.00)
GFR calc Af Amer: 16 mL/min — ABNORMAL LOW
GFR calc non Af Amer: 14 mL/min — ABNORMAL LOW
Glucose, Bld: 129 mg/dL — ABNORMAL HIGH (ref 70–99)
Potassium: 4 mmol/L (ref 3.5–5.1)
Sodium: 137 mmol/L (ref 135–145)

## 2018-02-04 LAB — GLUCOSE, CAPILLARY
GLUCOSE-CAPILLARY: 98 mg/dL (ref 70–99)
Glucose-Capillary: 116 mg/dL — ABNORMAL HIGH (ref 70–99)
Glucose-Capillary: 104 mg/dL — ABNORMAL HIGH (ref 70–99)
Glucose-Capillary: 134 mg/dL — ABNORMAL HIGH (ref 70–99)

## 2018-02-04 MED ORDER — GERHARDT'S BUTT CREAM
TOPICAL_CREAM | Freq: Two times a day (BID) | CUTANEOUS | Status: DC
Start: 2018-02-04 — End: 2018-02-06
  Administered 2018-02-04 – 2018-02-05 (×2): via TOPICAL
  Filled 2018-02-04: qty 1

## 2018-02-04 NOTE — Care Management Obs Status (Signed)
[place] NOTIFICATION   Patient Details  Name: Jodi Underwood MRN: [id number] Date of Birth: 1945/11/20   Medicare Observation Status Notification Given:  Yes    Leeroy Cha, RN 02/04/2018, 10:40 AM

## 2018-02-04 NOTE — Progress Notes (Addendum)
PROGRESS NOTE    Jodi Underwood  PJK:[id number] DOB: Sep 11, 1945 DOA: 02/01/2018 PCP: Jodi Marble, MD  Brief Narrative:72 y.o.femalewithhistory of hypertension, CHF, hypothyroidism, atrial fibrillation, anemia, chronic kidney disease stage III-IV who was recently admitted for renal failure last month at the time patient also had sigmoid diverticulitis was eventually discharged to rehab when patient states 2 days ago she has been discharged from rehab to home. Over the last 24 hours patient has been having multiple episodes of nausea vomiting and diarrhea. Has been having epigastric discomfort. Denies any chest pain or shortness of breath. She did not notice some dark stools during diarrhea.  ED Course:In the ER patient had CT abdomen pelvis does not show anything acute. Creatinine is increased from baseline 5.4. Was given fluid bolus 1 L. Patient last time I examined epigastric discomfort is resolved. Patient admitted for further observation of worsening renal function with nausea vomiting and diarrhea. Stool for occult blood was negative.    Assessment & Plan:   Active Problems:   DM type 2, uncontrolled, with neuropathy ([place])   Morbid obesity ([place])   Essential hypertension, benign   Lymphedema of both lower extremities   A-fib (HCC)   Chronic systolic CHF (congestive heart failure) (HCC)   Nausea vomiting and diarrhea   CKD (chronic kidney disease) stage 3, GFR 30-59 ml/min (HCC)   Nausea and vomiting   Dehydration  1] gastroenteritis-vomiting has been resolved patient reports she has started to have diarrhea again.  Staff reported black stools last night.obtain fecal occult blood test. Stool studies have been negative.  CT of the abdomen and pelvis unremarkable.  Continue probiotics and cholestyramine.  I have requested C. difficile studies but it  has not been done yet.  2] acute on chronic kidney disease stage IV status post ivf  bolus improved.  Patient  reports that she was not following up with nephrologist as an outpatient on a regular basis.  She knows now that she must follow-up with them once discharged from here.  3]chf stable. ef 50% Restart Bumex prn.  Since she is still having diarrhea and decreased p.o. intake Bumex is currently on hold for lungs are clear to auscultation.  She is on 2 L of oxygen at home prior to admission.  4] hypertension stable on Norvasc and metoprolol.  5] obstructive sleep apnea patient refused CPAP last night.  She did have oxygen at 2 L 24/7 at home.  6] chronic bilateral lower extremity lymphedema stable  7] normocytic anemia of chronic disease-?hemodilution.check FOBT,anemia panel.  8]PAFIB-rate controlled on amiodarone and metoprolol.not on AC.     DVT prophylaxis:heparin Code Status full Family Communication:none Disposition Plan: Patient lives at home with her husband plan for discharge once diarrhea has resolved.  Consultants:  none  Procedures:none Antimicrobials none Subjective: Patient resting in bed in no acute distress complaints of diarrhea black in color, no nausea vomiting.  Was able to eat breakfast.  Objective: Vitals:   02/03/18 1355 02/03/18 2024 02/04/18 0511 02/04/18 1432  BP: (!) 136/54 (!) 131/54 (!) 114/59 111/61  Pulse: 86 84 89 82  Resp: 18 19 18    Temp: 97.9 F (36.6 C) 98.1 F (36.7 C) 97.9 F (36.6 C) 98.3 F (36.8 C)  TempSrc:  Oral Oral Oral  SpO2: 100% 100% 100% 100%  Weight:      Height:        Intake/Output Summary (Last 24 hours) at 02/04/2018 1454 Last data filed at 02/04/2018 0512 Gross per 24 hour  Intake -  Output 500 ml  Net -500 ml   Filed Weights   02/02/18 0500  Weight: (!) 150.6 kg (332 lb 1.6 oz)    Examination:  General exam: Appears calm and comfortable  Respiratory system: Clear to auscultation. Respiratory effort normal. Cardiovascular system: S1 & S2 heard, RRR. No JVD, murmurs, rubs, gallops or clicks. No pedal  edema. Gastrointestinal system: Abdomen is nondistended, soft and nontender. No organomegaly or masses felt. Normal bowel sounds heard. Central nervous system: Alert and oriented. No focal neurological deficits. Extremities: non pitting edema Skin: No rashes, lesions or ulcers Psychiatry: Judgement and insight appear normal. Mood & affect appropriate.     Data Reviewed: I have personally reviewed following labs and imaging studies  CBC: Recent Labs  Lab 02/01/18 1732 02/02/18 0549  WBC 7.6 7.9  NEUTROABS 2.8  --   HGB 8.8* 7.8*  HCT 30.1* 25.9*  MCV 86.2 85.8  PLT 377 277   Basic Metabolic Panel: Recent Labs  Lab 02/01/18 1732 02/02/18 0549 02/03/18 0616  NA 136 139 139  K 3.9 4.2 3.8  CL 98 101 102  CO2 29 27 29   GLUCOSE 126* 116* 108*  BUN 34* 38* 39*  CREATININE 3.60* 3.80* 3.57*  CALCIUM 8.1* 7.9* 8.0*   GFR: Estimated Creatinine Clearance: 21.5 mL/min (A) (by C-G formula based on SCr of 3.57 mg/dL (H)). Liver Function Tests: Recent Labs  Lab 02/01/18 1732  AST 20  ALT 12  ALKPHOS 75  BILITOT 0.6  PROT 6.6  ALBUMIN 2.2*   Recent Labs  Lab 02/01/18 1732  LIPASE 20   No results for input(s): AMMONIA in the last 168 hours. Coagulation Profile: No results for input(s): INR, PROTIME in the last 168 hours. Cardiac Enzymes: No results for input(s): CKTOTAL, CKMB, CKMBINDEX, TROPONINI in the last 168 hours. BNP (last 3 results) No results for input(s): PROBNP in the last 8760 hours. HbA1C: No results for input(s): HGBA1C in the last 72 hours. CBG: Recent Labs  Lab 02/03/18 1208 02/03/18 1657 02/03/18 2028 02/04/18 0727 02/04/18 1102  GLUCAP 109* 105* 122* 98 116*   Lipid Profile: No results for input(s): CHOL, HDL, LDLCALC, TRIG, CHOLHDL, LDLDIRECT in the last 72 hours. Thyroid Function Tests: No results for input(s): TSH, T4TOTAL, FREET4, T3FREE, THYROIDAB in the last 72 hours. Anemia Panel: No results for input(s): VITAMINB12, FOLATE,  FERRITIN, TIBC, IRON, RETICCTPCT in the last 72 hours. Sepsis Labs: No results for input(s): PROCALCITON, LATICACIDVEN in the last 168 hours.  No results found for this or any previous visit (from the past 240 hour(s)).       Radiology Studies: No results found.      Scheduled Meds: . amiodarone  200 mg Oral Daily  . amLODipine  5 mg Oral Daily  . cholestyramine light  4 g Oral TID  . docusate sodium  100 mg Oral QODAY  . fluticasone  1 spray Each Nare Q12H  . Gerhardt's butt cream   Topical BID  . guaiFENesin  600 mg Oral Q12H  . heparin  5,000 Units Subcutaneous Q8H  . insulin aspart  0-9 Units Subcutaneous TID WC  . levothyroxine  50 mcg Oral QAC breakfast  . loratadine  10 mg Oral Daily  . Melatonin  3 mg Oral QHS  . metoprolol tartrate  12.5 mg Oral BID  . saccharomyces boulardii  250 mg Oral TID   Continuous Infusions:   LOS: 0 days     Georgette Shell, MD [facility]  If 7PM-7AM, please contact night-coverage [email or web address] Password [facility] 02/04/2018, 2:54 PM

## 2018-02-04 NOTE — Progress Notes (Signed)
Pt. Refuses most of her medication by stating that the it makes me sick.Pt's bed is  also was made hight to  elevate the foot side of the bed.Pt.demanded  to  elevate  The entire  bed  high.The nurse explained her the importance of low bed for a pt. who is fall risk.Pt.refused to lower  the bed.Pt.is non ambulatory and complained about the bed to the charge nurse.

## 2018-02-04 NOTE — Consult Note (Signed)
[place] Nurse wound consult note Reason for Consult:Incontinence associated dermatitis to buttocks in gluteal fold from excessive diarrhea.  WIll begin barrier cream Wound type:Moisture associated skin damage (MASD) from fecal incontinence Pressure Injury POA: NA Measurement: 1 cm abrasion with peeling epithelium Wound HRX:UMZY and moist Drainage (amount, consistency, odor) scant weeping Periwound:intact Dressing procedure/placement/frequency:Cleanse buttocks with soap and water daily and PRN soilage.  Apply Gerhardts butt paste twice daily.  Will not follow at this time.  Please re-consult if needed.  Domenic Moras RN BSN [facility] Pager [phone]

## 2018-02-04 NOTE — Care Management Obs Status (Signed)
[place] NOTIFICATION   Patient Details  Name: Jodi Underwood MRN: [id number] Date of Birth: 1945-12-24   Medicare Observation Status Notification Given:       Leeroy Cha, RN 02/04/2018, 10:39 AM

## 2018-02-04 NOTE — Progress Notes (Signed)
PT Cancellation Note  Patient Details Name: Jodi Underwood MRN: [id number] DOB: 10-22-1945   Cancelled Treatment:    Reason Eval/Treat Not Completed: Medical issues which prohibited therapy(pt is nauseous and having diarrhea, she stated she's not well enough to do any PT today. Will attempt again tomorrow. )   Philomena Doheny 02/04/2018, 1:23 PM [phone]

## 2018-02-05 DIAGNOSIS — I10 Essential (primary) hypertension: Secondary | ICD-10-CM

## 2018-02-05 LAB — IRON AND TIBC
IRON: 65 ug/dL (ref 28–170)
SATURATION RATIOS: 60 % — AB (ref 10.4–31.8)
TIBC: 108 ug/dL — AB (ref 250–450)
UIBC: 43 ug/dL

## 2018-02-05 LAB — FERRITIN: FERRITIN: 143 ng/mL (ref 11–307)

## 2018-02-05 LAB — GLUCOSE, CAPILLARY
GLUCOSE-CAPILLARY: 94 mg/dL (ref 70–99)
Glucose-Capillary: 128 mg/dL — ABNORMAL HIGH (ref 70–99)
Glucose-Capillary: 108 mg/dL — ABNORMAL HIGH (ref 70–99)

## 2018-02-05 MED ORDER — SACCHAROMYCES BOULARDII 250 MG PO CAPS: 250.0000 mg | ORAL_CAPSULE | Freq: Three times a day (TID) | ORAL | Status: AC

## 2018-02-05 MED ORDER — CHOLESTYRAMINE LIGHT 4 G PO PACK: 4.0000 g | PACK | Freq: Three times a day (TID) | ORAL | 0 refills | Status: AC

## 2018-02-05 MED ORDER — GERHARDT'S BUTT CREAM: 1.0000 "application " | TOPICAL_CREAM | Freq: Two times a day (BID) | CUTANEOUS | Status: AC

## 2018-02-05 NOTE — Progress Notes (Signed)
[facility] called and scheduled for 1930; so pts daughter can be at the house when they arrive there.

## 2018-02-05 NOTE — Discharge Summary (Signed)
Physician Discharge Summary  Jodi Underwood GYI:[id number] DOB: May 18, 1946 DOA: 02/01/2018  PCP: Jodi Marble, MD  Admit date: 02/01/2018 Discharge date: 02/05/2018  Admitted From:HOME Disposition: Home Recommendations for Outpatient Follow-up:  1. Follow up with PCP in 1-2 weeks 2. Please obtain BMP/CBC in one [place]: None Equipment/Devices: None Discharge Condition: Stable CODE STATUS: Full code Diet recommendation: Cardiac Brief/Interim Summary:72 y.o.femalewithhistory of hypertension, CHF, hypothyroidism, atrial fibrillation, anemia, chronic kidney disease stage III-IV who was recently admitted for renal failure last month at the time patient also had sigmoid diverticulitis was eventually discharged to rehab when patient states 2 days ago she has been discharged from rehab to home. Over the last 24 hours patient has been having multiple episodes of nausea vomiting and diarrhea. Has been having epigastric discomfort. Denies any chest pain or shortness of breath. She did not notice some dark stools during diarrhea.  ED Course:In the ER patient had CT abdomen pelvis does not show anything acute. Creatinine is increased from baseline 5.4. Was given fluid bolus 1 L. Patient last time I examined epigastric discomfort is resolved. Patient admitted for further observation of worsening renal function with nausea vomiting and diarrhea. Stool for occult blood was negative.      Discharge Diagnoses:  Active Problems:   DM type 2, uncontrolled, with neuropathy ([place])   Morbid obesity ([place])   Essential hypertension, benign   Lymphedema of both lower extremities   A-fib ([facility])   Chronic systolic CHF (congestive heart failure) ([facility])   Nausea vomiting and diarrhea   CKD (chronic kidney disease) stage 3, GFR 30-59 ml/min ([facility])   Nausea and vomiting   Dehydration  1] gastroenteritis patient admitted with nausea vomiting and diarrhea.  Nausea and vomiting  resolved within few hours of admission.  Diarrhea also resolved.  Stool studies negative.  Stool C. difficile was not done due to patient stopped having any further diarrhea.  She continued to improve able to tolerate p.o. Intake.  Patient lives at home with her family.  She is not very ambulatory at home she moves between bed and chair.  Feels like she is back to baseline.  We will plan for discharge today.  2] acute on chronic kidney disease stage IV status post food bolus improved.  Patient reports that she was not following up with nephrologist as an outpatient on a regular basis.  She knows now that she must follow-up with them once discharged from here.  3]chf stable.  Restart Bumex today.  4] hypertension stable on Norvasc and metoprolol.  5] obstructive sleep apnea patient refused CPAP last night.  She did have oxygen at 2 L 24/7 at home.  6] chronic bilateral lower extremity lymphedema stable  7]afib continue amiodarone bumex metoprolol      Discharge Instructions  Discharge Instructions    Call MD for:  difficulty breathing, headache or visual disturbances   Complete by:  As directed    Call MD for:  persistant dizziness or light-headedness   Complete by:  As directed    Call MD for:  persistant nausea and vomiting   Complete by:  As directed    Call MD for:  redness, tenderness, or signs of infection (pain, swelling, redness, odor or green/yellow discharge around incision site)   Complete by:  As directed    Call MD for:  severe uncontrolled pain   Complete by:  As directed    Call MD for:  temperature >100.4   Complete by:  As  directed    Diet - low sodium heart healthy   Complete by:  As directed    Increase activity slowly   Complete by:  As directed      Allergies as of 02/05/2018      Reactions   Ciprofloxacin Other (See Comments)   Hallucinations      Medication List    STOP taking these medications   albuterol 0.63 MG/3ML nebulizer  solution Commonly known as:  ACCUNEB   amLODipine 5 MG tablet Commonly known as:  NORVASC   cephALEXin 250 MG capsule Commonly known as:  KEFLEX   docusate sodium 100 MG capsule Commonly known as:  COLACE   feeding supplement (GLUCERNA SHAKE) Liqd   ondansetron 8 MG tablet Commonly known as:  ZOFRAN   vancomycin 125 MG capsule Commonly known as:  VANCOCIN     TAKE these medications   acetaminophen 325 MG tablet Commonly known as:  TYLENOL Take 650 mg by mouth every 6 (six) hours as needed (For pain.).   amiodarone 200 MG tablet Commonly known as:  PACERONE Take 200 mg by mouth daily.   bumetanide 2 MG tablet Commonly known as:  BUMEX Take 2 mg by mouth daily.   cholestyramine light 4 g packet Commonly known as:  PREVALITE Take 1 packet (4 g total) by mouth 3 (three) times daily.   cyclobenzaprine 10 MG tablet Commonly known as:  FLEXERIL Take 10 mg by mouth every 6 (six) hours as needed for muscle spasms.   fluticasone 50 MCG/ACT nasal spray Commonly known as:  FLONASE Place 2 sprays into both nostrils daily. What changed:    how much to take  when to take this   Gerhardt's butt cream Crea Apply 1 application topically 2 (two) times daily.   guaiFENesin 600 MG 12 hr tablet Commonly known as:  MUCINEX Take 600 mg by mouth every 12 (twelve) hours.   levothyroxine 50 MCG tablet Commonly known as:  SYNTHROID, LEVOTHROID Take 50 mcg by mouth daily.   Melatonin 3 MG Tabs Take 3 mg by mouth at bedtime.   metoprolol tartrate 25 MG tablet Commonly known as:  LOPRESSOR Take 0.5 tablets (12.5 mg total) by mouth 2 (two) times daily.   montelukast 10 MG tablet Commonly known as:  SINGULAIR Take 10 mg by mouth daily.   potassium chloride 10 MEQ tablet Commonly known as:  K-DUR Take 10 mEq by mouth 3 (three) times daily.   promethazine 25 MG tablet Commonly known as:  PHENERGAN Take 25 mg by mouth every 6 (six) hours as needed for nausea or vomiting.    saccharomyces boulardii 250 MG capsule Commonly known as:  FLORASTOR Take 1 capsule (250 mg total) by mouth 3 (three) times daily.   ZYRTEC ALLERGY 10 MG tablet Generic drug:  cetirizine Take 10 mg by mouth daily.       Allergies  Allergen Reactions  . Ciprofloxacin Other (See Comments)    Hallucinations    Consultations:  none   Procedures/Studies: Ct Abdomen Pelvis Wo Contrast  Result Date: 02/01/2018 CLINICAL DATA:  Abdominal pain with nausea, vomiting and diarrhea. EXAM: CT ABDOMEN AND PELVIS WITHOUT CONTRAST TECHNIQUE: Multidetector CT imaging of the abdomen and pelvis was performed following the standard protocol without IV contrast. COMPARISON:  CT abdomen pelvis 12/20/2017 FINDINGS: LOWER CHEST: Bibasilar atelectasis. HEPATOBILIARY: Normal hepatic contours and density. No intra- or extrahepatic biliary dilatation. Layering density in the gallbladder, likely sludge. PANCREAS: Normal parenchymal contours without ductal dilatation. No peripancreatic  fluid collection. SPLEEN: Normal. ADRENALS/URINARY TRACT: --Adrenal glands: Normal. --Right kidney/ureter: Unchanged nonobstructive 3 mm upper renal pelvis stone. No hydronephrosis. --Left kidney/ureter: No hydronephrosis, nephroureterolithiasis, perinephric stranding or solid renal mass. --Urinary bladder: Normal for degree of distention STOMACH/BOWEL: --Stomach/Duodenum: No hiatal hernia or other gastric abnormality. Normal duodenal course. --Small bowel: No dilatation or inflammation. --Colon: There is persistent wall thickening of the ascending colon, likely due to underdistention, as there is no surrounding inflammatory stranding. Inflammation surrounding the descending and sigmoid colon has resolved. --Appendix: Not visualized. No right lower quadrant inflammation or free fluid. VASCULAR/LYMPHATIC: Normal course and caliber of the major abdominal vessels. No abdominal or pelvic lymphadenopathy. REPRODUCTIVE: Status post hysterectomy.  No adnexal mass. MUSCULOSKELETAL. Extensive degenerative disc disease and facet arthrosis with at least moderate L4-5 spinal canal stenosis. There is also spinal canal stenosis at the T12-L1 level. OTHER: Fat-containing paraumbilical hernia is unchanged in size, but no longer contains a loop of bowel. IMPRESSION: 1. Resolution of previously seen descending/sigmoid colon inflammation. 2. No acute abdominal or pelvic abnormality. 3. Moderate spinal canal stenosis at T12-L1 and L4-5. 4. Nonobstructive right nephrolithiasis. Electronically Signed   By: Ulyses Jarred M.D.   On: 02/01/2018 19:00   Dg Chest 2 View  Result Date: 02/01/2018 CLINICAL DATA:  Abdominal pain, nausea, vomiting and diarrhea. Bilateral lower extremity swelling. EXAM: CHEST - 2 VIEW COMPARISON:  01/25/2018. FINDINGS: Stable mildly enlarged cardiac silhouette. Stable linear scarring or atelectasis at the right lung base. Otherwise, clear lungs with normal vascularity. Thoracic spine degenerative changes. IMPRESSION: No acute abnormality.  Stable cardiomegaly. Electronically Signed   By: Claudie Revering M.D.   On: 02/01/2018 18:44   Dg Chest Portable 1 View  Result Date: 01/25/2018 CLINICAL DATA:  72 y/o  F; lightheaded and weak. EXAM: PORTABLE CHEST 1 VIEW COMPARISON:  12/25/2017 chest radiograph. FINDINGS: Stable cardiomegaly given projection and technique. Pulmonary vascular congestion. No consolidation, effusion, or pneumothorax identified. Aortic atherosclerosis. Bones are unremarkable. IMPRESSION: Stable cardiomegaly and aortic atherosclerosis. Pulmonary vascular congestion. No consolidation. Electronically Signed   By: Kristine Garbe M.D.   On: 01/25/2018 14:24   Dg Knee Complete 4 Views Left  Result Date: 02/01/2018 CLINICAL DATA:  Fall.  Pain. EXAM: LEFT KNEE - COMPLETE 4+ VIEW COMPARISON:  None. FINDINGS: No evidence of fracture, or dislocation. Severe tricompartmental degenerative change. Osteopenia. No effusion.  IMPRESSION: Severe DJD.  No fracture. Electronically Signed   By: Staci Righter M.D.   On: 02/01/2018 18:46    (Echo, Carotid, EGD, Colonoscopy, ERCP)    Subjective:   Discharge Exam: Vitals:   02/04/18 1949 02/05/18 0328  BP: (!) 126/58 (!) 121/50  Pulse: 85 85  Resp: 20 (!) 21  Temp: 98.2 F (36.8 C) (!) 97.5 F (36.4 C)  SpO2: 100% 100%   Vitals:   02/04/18 0511 02/04/18 1432 02/04/18 1949 02/05/18 0328  BP: (!) 114/59 111/61 (!) 126/58 (!) 121/50  Pulse: 89 82 85 85  Resp: 18  20 (!) 21  Temp: 97.9 F (36.6 C) 98.3 F (36.8 C) 98.2 F (36.8 C) (!) 97.5 F (36.4 C)  TempSrc: Oral Oral Oral Oral  SpO2: 100% 100% 100% 100%  Weight:      Height:        General: Pt is alert, awake, not in acute distress Cardiovascular: RRR, S1/S2 +, no rubs, no gallops Respiratory: CTA bilaterally, no wheezing, no rhonchi Abdominal: Soft, NT, ND, bowel sounds + Extremities: no edema, no cyanosis    The results of  significant diagnostics from this hospitalization (including imaging, microbiology, ancillary and laboratory) are listed below for reference.     Microbiology: No results found for this or any previous visit (from the past 240 hour(s)).   Labs: BNP (last 3 results) Recent Labs    06/11/17 0037 12/20/17 1140 01/25/18 1354  BNP 109.9* 202.4* 67.1   Basic Metabolic Panel: Recent Labs  Lab 02/01/18 1732 02/02/18 0549 02/03/18 0616 02/04/18 1434  NA 136 139 139 137  K 3.9 4.2 3.8 4.0  CL 98 101 102 99  CO2 29 27 29 28   GLUCOSE 126* 116* 108* 129*  BUN 34* 38* 39* 37*  CREATININE 3.60* 3.80* 3.57* 3.12*  CALCIUM 8.1* 7.9* 8.0* 7.9*   Liver Function Tests: Recent Labs  Lab 02/01/18 1732  AST 20  ALT 12  ALKPHOS 75  BILITOT 0.6  PROT 6.6  ALBUMIN 2.2*   Recent Labs  Lab 02/01/18 1732  LIPASE 20   No results for input(s): AMMONIA in the last 168 hours. CBC: Recent Labs  Lab 02/01/18 1732 02/02/18 0549 02/04/18 1434  WBC 7.6 7.9 8.3   NEUTROABS 2.8  --   --   HGB 8.8* 7.8* 7.7*  HCT 30.1* 25.9* 25.7*  MCV 86.2 85.8 85.4  PLT 377 296 426*   Cardiac Enzymes: No results for input(s): CKTOTAL, CKMB, CKMBINDEX, TROPONINI in the last 168 hours. BNP: Invalid input(s): POCBNP CBG: Recent Labs  Lab 02/04/18 0727 02/04/18 1102 02/04/18 1700 02/04/18 2135 02/05/18 0739  GLUCAP 98 116* 104* 134* 94   D-Dimer No results for input(s): DDIMER in the last 72 hours. Hgb A1c No results for input(s): HGBA1C in the last 72 hours. Lipid Profile No results for input(s): CHOL, HDL, LDLCALC, TRIG, CHOLHDL, LDLDIRECT in the last 72 hours. Thyroid function studies No results for input(s): TSH, T4TOTAL, T3FREE, THYROIDAB in the last 72 hours.  Invalid input(s): FREET3 Anemia work up Recent Labs    02/05/18 0603  FERRITIN 143  TIBC 108*  IRON 65   Urinalysis    Component Value Date/Time   COLORURINE AMBER (A) 02/01/2018 2141   APPEARANCEUR TURBID (A) 02/01/2018 2141   LABSPEC 1.014 02/01/2018 2141   PHURINE 5.0 02/01/2018 2141   GLUCOSEU NEGATIVE 02/01/2018 2141   HGBUR SMALL (A) 02/01/2018 2141   BILIRUBINUR NEGATIVE 02/01/2018 2141   KETONESUR NEGATIVE 02/01/2018 2141   PROTEINUR NEGATIVE 02/01/2018 2141   UROBILINOGEN 0.2 09/29/2014 1224   NITRITE NEGATIVE 02/01/2018 2141   LEUKOCYTESUR MODERATE (A) 02/01/2018 2141   Sepsis Labs Invalid input(s): PROCALCITONIN,  WBC,  LACTICIDVEN Microbiology No results found for this or any previous visit (from the past 240 hour(s)).   Time coordinating discharge: 35 minutes  SIGNED:   Georgette Shell, MD  [facility] 02/05/2018, 11:22 AM Pager   If 7PM-7AM, please contact night-coverage [email or web address] Password [email or web address]

## 2018-02-06 NOTE — Progress Notes (Signed)
PTAR into transport pt, pt transported off unit without incident. All belongings and discharge paperwork given to transporters.

## 2018-10-21 IMAGING — CT CT ABD-PELV W/O CM
2 of 4 series · 16 of 46 positions shown, 18 images · non-contrast
Comparison: CT abdomen pelvis 12/20/2017

CLINICAL DATA: Abdominal pain with nausea, vomiting and diarrhea.

EXAM:
CT ABDOMEN AND PELVIS WITHOUT CONTRAST
TECHNIQUE: Multidetector CT imaging of the abdomen and pelvis was performed
following the standard protocol without IV contrast.

[Series 2: axial st · axial · 0.98mm/px · z∈[-558,-123]mm · 13 of 99 slices shown, 15 images]
[im 6/99  soft-tissue]
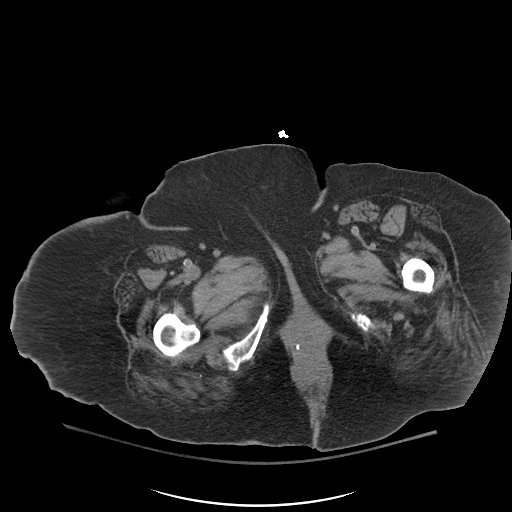
[im 6/99  bone]
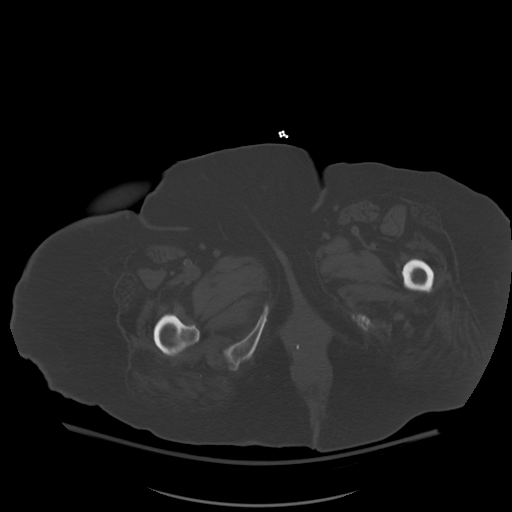
[im 16/99  soft-tissue]
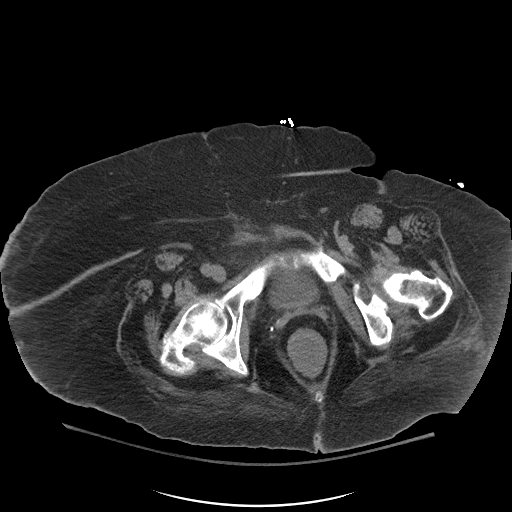
[im 21/99  soft-tissue]
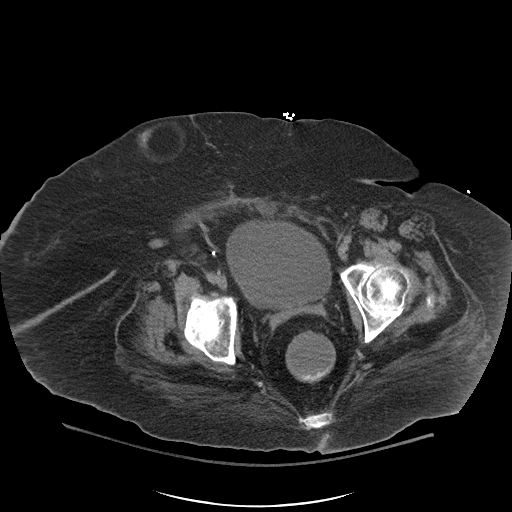
[im 26/99  soft-tissue]
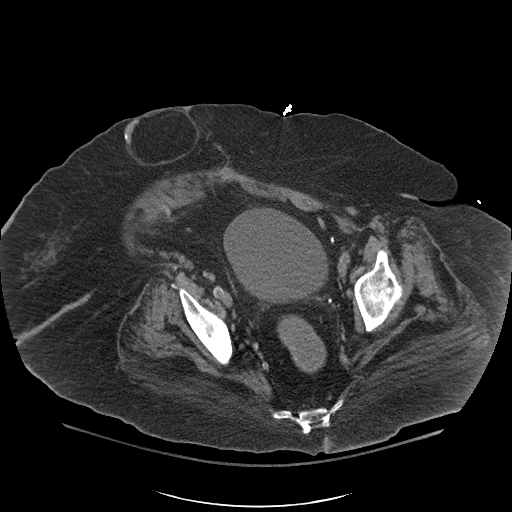
[im 37/99  soft-tissue]
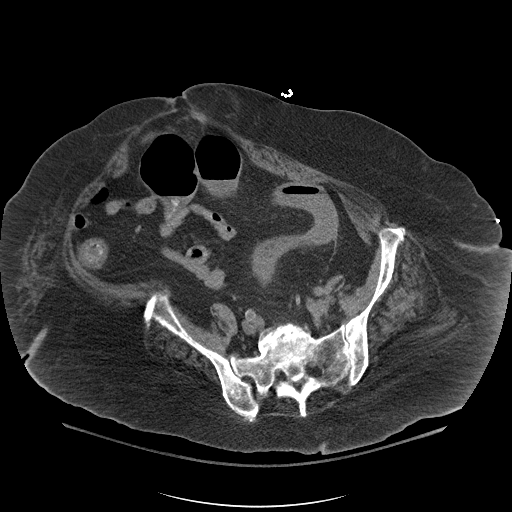
[im 42/99  soft-tissue]
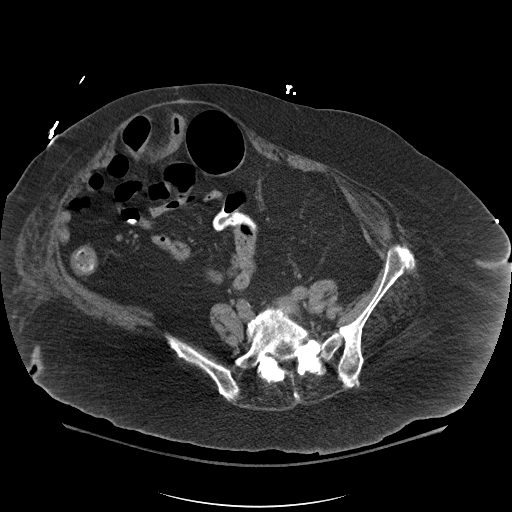
[im 52/99  soft-tissue]
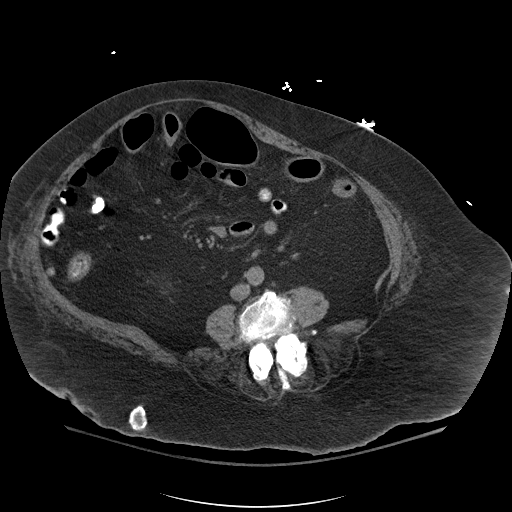
[im 57/99  soft-tissue]
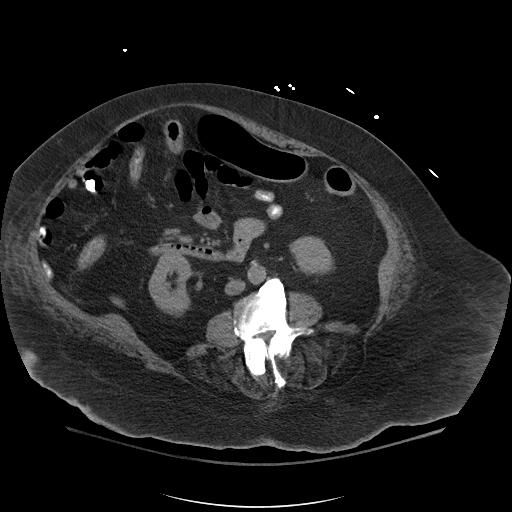
[im 62/99  soft-tissue]
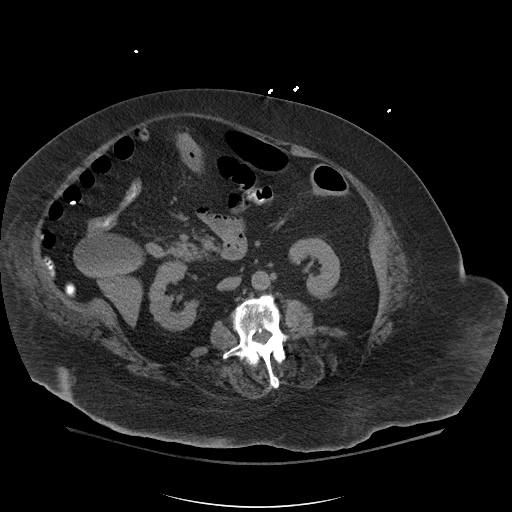
[im 62/99  bone]
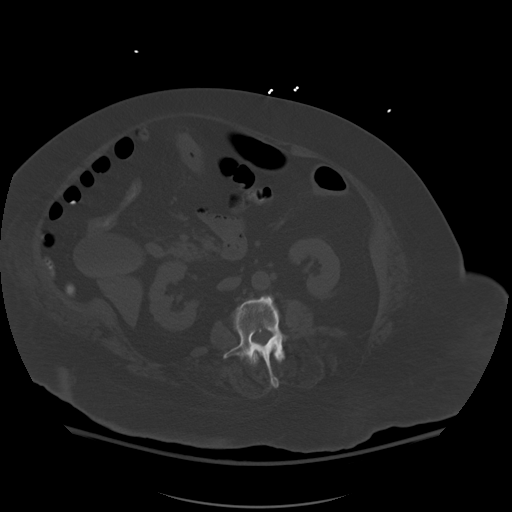
[im 73/99  soft-tissue]
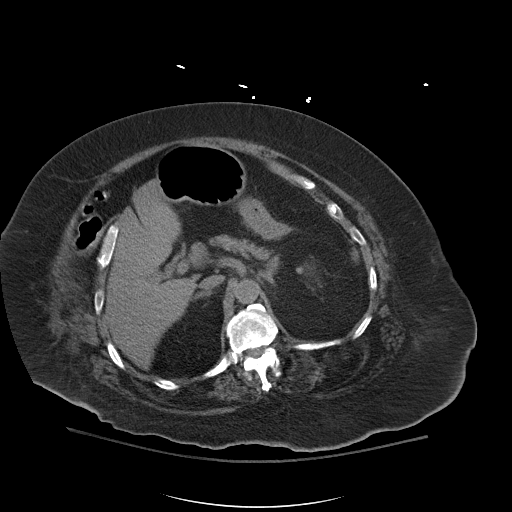
[im 78/99  soft-tissue]
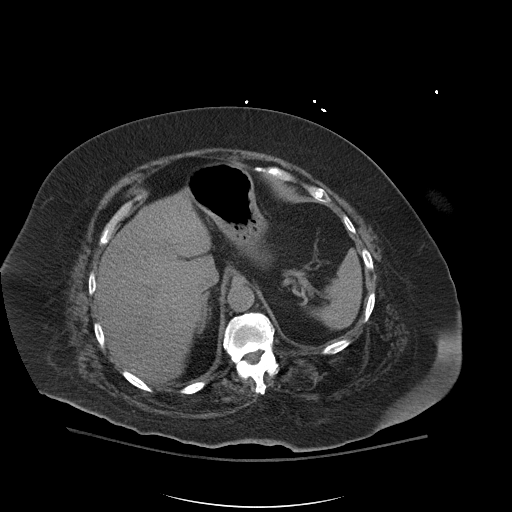
[im 83/99  soft-tissue]
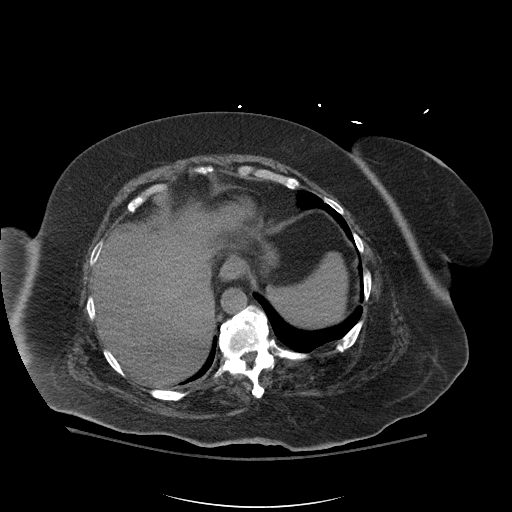
[im 93/99  soft-tissue]
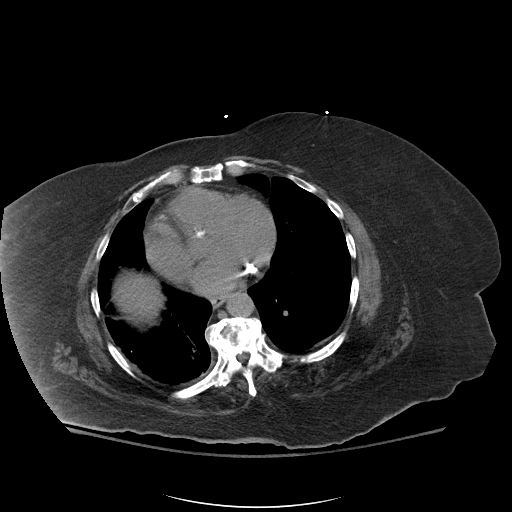

[Series 5: coronal st · coronal · 0.98mm/px · 3 of 128 slices shown]
[im 43/128  soft-tissue]
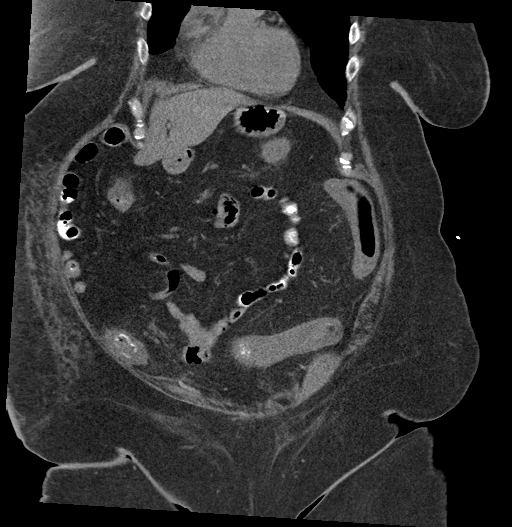
[im 57/128  soft-tissue]
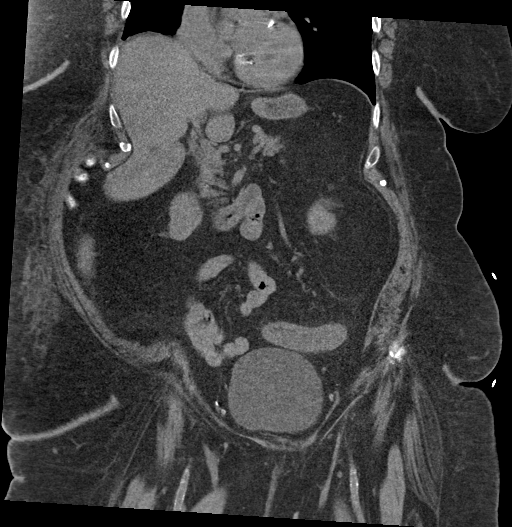
[im 71/128  soft-tissue]
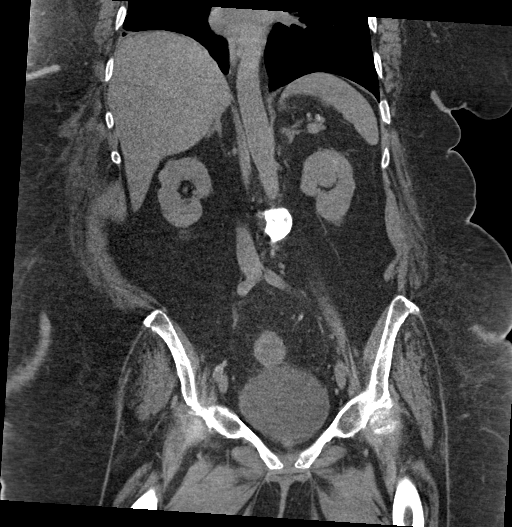

[16 of 46 positions shown; findings below may reference images not displayed]

FINDINGS: LOWER CHEST: Bibasilar atelectasis.

HEPATOBILIARY: Normal hepatic contours and density. No intra- or
extrahepatic biliary dilatation. Layering density in the
gallbladder, likely sludge.

PANCREAS: Normal parenchymal contours without ductal dilatation. No
peripancreatic fluid collection.

SPLEEN: Normal.

ADRENALS/URINARY TRACT:

--Adrenal glands: Normal.

--Right kidney/ureter: Unchanged nonobstructive 3 mm upper renal
pelvis stone. No hydronephrosis.

--Left kidney/ureter: No hydronephrosis, nephroureterolithiasis,
perinephric stranding or solid renal mass.

--Urinary bladder: Normal for degree of distention

STOMACH/BOWEL:

--Stomach/Duodenum: No hiatal hernia or other gastric abnormality.
Normal duodenal course.

--Small bowel: No dilatation or inflammation.

--Colon: There is persistent wall thickening of the ascending colon,
likely due to underdistention, as there is no surrounding
inflammatory stranding. Inflammation surrounding the descending and
sigmoid colon has resolved.

--Appendix: Not visualized. No right lower quadrant inflammation or
free fluid.

VASCULAR/LYMPHATIC: Normal course and caliber of the major abdominal
vessels. No abdominal or pelvic lymphadenopathy.

REPRODUCTIVE: Status post hysterectomy. No adnexal mass.

MUSCULOSKELETAL. Extensive degenerative disc disease and facet
arthrosis with at least moderate L4-5 spinal canal stenosis. There
is also spinal canal stenosis at the T12-L1 level.

OTHER: Fat-containing paraumbilical hernia is unchanged in size, but
no longer contains a loop of bowel.
IMPRESSION: 1. Resolution of previously seen descending/sigmoid colon
inflammation.
2. No acute abdominal or pelvic abnormality.
3. Moderate spinal canal stenosis at T12-L1 and L4-5.
4. Nonobstructive right nephrolithiasis.

## 2019-05-13 ENCOUNTER — Emergency Department (HOSPITAL_COMMUNITY): Payer: Medicare Other

## 2019-05-13 ENCOUNTER — Inpatient Hospital Stay (HOSPITAL_COMMUNITY)
Admission: EM | Admit: 2019-05-13 | Discharge: 2019-05-31 | DRG: 207 | Disposition: A | Discharge status: Home Health | Payer: Medicare Other | Attending: Family Medicine | Admitting: Family Medicine

## 2019-05-13 DIAGNOSIS — Z90710 Acquired absence of both cervix and uterus: Secondary | ICD-10-CM

## 2019-05-13 DIAGNOSIS — E875 Hyperkalemia: Secondary | ICD-10-CM | POA: Diagnosis present

## 2019-05-13 DIAGNOSIS — G9341 Metabolic encephalopathy: Secondary | ICD-10-CM | POA: Diagnosis present

## 2019-05-13 DIAGNOSIS — Z79890 Hormone replacement therapy (postmenopausal): Secondary | ICD-10-CM

## 2019-05-13 DIAGNOSIS — Z79899 Other long term (current) drug therapy: Secondary | ICD-10-CM

## 2019-05-13 DIAGNOSIS — J9622 Acute and chronic respiratory failure with hypercapnia: Secondary | ICD-10-CM | POA: Diagnosis present

## 2019-05-13 DIAGNOSIS — J9621 Acute and chronic respiratory failure with hypoxia: Secondary | ICD-10-CM | POA: Diagnosis not present

## 2019-05-13 DIAGNOSIS — R0981 Nasal congestion: Secondary | ICD-10-CM | POA: Diagnosis not present

## 2019-05-13 DIAGNOSIS — Z7951 Long term (current) use of inhaled steroids: Secondary | ICD-10-CM

## 2019-05-13 DIAGNOSIS — K5900 Constipation, unspecified: Secondary | ICD-10-CM | POA: Diagnosis not present

## 2019-05-13 DIAGNOSIS — J9809 Other diseases of bronchus, not elsewhere classified: Secondary | ICD-10-CM

## 2019-05-13 DIAGNOSIS — I5043 Acute on chronic combined systolic (congestive) and diastolic (congestive) heart failure: Secondary | ICD-10-CM | POA: Diagnosis present

## 2019-05-13 DIAGNOSIS — Z7952 Long term (current) use of systemic steroids: Secondary | ICD-10-CM

## 2019-05-13 DIAGNOSIS — L89159 Pressure ulcer of sacral region, unspecified stage: Secondary | ICD-10-CM | POA: Diagnosis present

## 2019-05-13 DIAGNOSIS — G2581 Restless legs syndrome: Secondary | ICD-10-CM | POA: Diagnosis not present

## 2019-05-13 DIAGNOSIS — J9600 Acute respiratory failure, unspecified whether with hypoxia or hypercapnia: Secondary | ICD-10-CM

## 2019-05-13 DIAGNOSIS — I130 Hypertensive heart and chronic kidney disease with heart failure and stage 1 through stage 4 chronic kidney disease, or unspecified chronic kidney disease: Secondary | ICD-10-CM | POA: Diagnosis present

## 2019-05-13 DIAGNOSIS — E872 Acidosis: Secondary | ICD-10-CM | POA: Diagnosis present

## 2019-05-13 DIAGNOSIS — H9192 Unspecified hearing loss, left ear: Secondary | ICD-10-CM | POA: Diagnosis present

## 2019-05-13 DIAGNOSIS — Z9114 Patient's other noncompliance with medication regimen: Secondary | ICD-10-CM

## 2019-05-13 DIAGNOSIS — Z6841 Body Mass Index (BMI) 40.0 and over, adult: Secondary | ICD-10-CM

## 2019-05-13 DIAGNOSIS — L89156 Pressure-induced deep tissue damage of sacral region: Secondary | ICD-10-CM | POA: Diagnosis present

## 2019-05-13 DIAGNOSIS — R609 Edema, unspecified: Secondary | ICD-10-CM

## 2019-05-13 DIAGNOSIS — Z794 Long term (current) use of insulin: Secondary | ICD-10-CM

## 2019-05-13 DIAGNOSIS — E662 Morbid (severe) obesity with alveolar hypoventilation: Secondary | ICD-10-CM | POA: Diagnosis present

## 2019-05-13 DIAGNOSIS — R34 Anuria and oliguria: Secondary | ICD-10-CM | POA: Diagnosis present

## 2019-05-13 DIAGNOSIS — T17500A Unspecified foreign body in bronchus causing asphyxiation, initial encounter: Secondary | ICD-10-CM

## 2019-05-13 DIAGNOSIS — Z9119 Patient's noncompliance with other medical treatment and regimen: Secondary | ICD-10-CM

## 2019-05-13 DIAGNOSIS — E1122 Type 2 diabetes mellitus with diabetic chronic kidney disease: Secondary | ICD-10-CM | POA: Diagnosis present

## 2019-05-13 DIAGNOSIS — J9690 Respiratory failure, unspecified, unspecified whether with hypoxia or hypercapnia: Secondary | ICD-10-CM | POA: Diagnosis present

## 2019-05-13 DIAGNOSIS — D539 Nutritional anemia, unspecified: Secondary | ICD-10-CM

## 2019-05-13 DIAGNOSIS — N1330 Unspecified hydronephrosis: Secondary | ICD-10-CM | POA: Diagnosis present

## 2019-05-13 DIAGNOSIS — J9601 Acute respiratory failure with hypoxia: Secondary | ICD-10-CM

## 2019-05-13 DIAGNOSIS — H6120 Impacted cerumen, unspecified ear: Secondary | ICD-10-CM | POA: Diagnosis present

## 2019-05-13 DIAGNOSIS — Z20828 Contact with and (suspected) exposure to other viral communicable diseases: Secondary | ICD-10-CM | POA: Diagnosis present

## 2019-05-13 DIAGNOSIS — N289 Disorder of kidney and ureter, unspecified: Secondary | ICD-10-CM

## 2019-05-13 DIAGNOSIS — R079 Chest pain, unspecified: Secondary | ICD-10-CM

## 2019-05-13 DIAGNOSIS — E1165 Type 2 diabetes mellitus with hyperglycemia: Secondary | ICD-10-CM | POA: Diagnosis present

## 2019-05-13 DIAGNOSIS — E039 Hypothyroidism, unspecified: Secondary | ICD-10-CM | POA: Diagnosis present

## 2019-05-13 DIAGNOSIS — I4891 Unspecified atrial fibrillation: Secondary | ICD-10-CM | POA: Diagnosis present

## 2019-05-13 DIAGNOSIS — R001 Bradycardia, unspecified: Secondary | ICD-10-CM | POA: Diagnosis present

## 2019-05-13 DIAGNOSIS — J9602 Acute respiratory failure with hypercapnia: Secondary | ICD-10-CM

## 2019-05-13 DIAGNOSIS — N184 Chronic kidney disease, stage 4 (severe): Secondary | ICD-10-CM | POA: Diagnosis present

## 2019-05-13 DIAGNOSIS — E1140 Type 2 diabetes mellitus with diabetic neuropathy, unspecified: Secondary | ICD-10-CM | POA: Diagnosis present

## 2019-05-13 DIAGNOSIS — J45909 Unspecified asthma, uncomplicated: Secondary | ICD-10-CM | POA: Diagnosis present

## 2019-05-13 DIAGNOSIS — D649 Anemia, unspecified: Secondary | ICD-10-CM | POA: Diagnosis present

## 2019-05-13 DIAGNOSIS — R0602 Shortness of breath: Secondary | ICD-10-CM

## 2019-05-13 DIAGNOSIS — I890 Lymphedema, not elsewhere classified: Secondary | ICD-10-CM | POA: Diagnosis present

## 2019-05-13 DIAGNOSIS — R400 Somnolence: Secondary | ICD-10-CM

## 2019-05-13 DIAGNOSIS — N179 Acute kidney failure, unspecified: Secondary | ICD-10-CM

## 2019-05-13 HISTORY — DX: Chronic kidney disease, unspecified: N18.9

## 2019-05-13 HISTORY — DX: Obstructive sleep apnea (adult) (pediatric): G47.33

## 2019-05-13 HISTORY — DX: Morbid (severe) obesity due to excess calories: E66.01

## 2019-05-13 LAB — CBG MONITORING, ED: Glucose-Capillary: 122 mg/dL — ABNORMAL HIGH (ref 70–99)

## 2019-05-13 NOTE — ED Provider Notes (Addendum)
[facility] EMERGENCY DEPARTMENT Provider Note   CSN: [id number] Arrival date & time: 05/13/19  2152     History   Chief Complaint Chief Complaint  Patient presents with  . Shortness of Breath    HPI Jodi Underwood is a 73 y.o. female.     The history is provided by medical records and the EMS personnel (ems report). The history is limited by the condition of the patient.  Shortness of Breath Severity:  Severe Onset quality:  Gradual Duration:  1 week Timing:  Constant Progression:  Worsening  LVL 5 caveat for AMS and somnolence with hypercarbic resp failure  Past Medical History:  Diagnosis Date  . Anemia   . Arthritis   . Asthma   . Atrial fibrillation ([place])   . Degenerative disc disease   . Diabetes mellitus   . Diabetic neuropathy ([place])   . Edema extremities   . Hypertension   . Lymphedema   . Pneumonia   . Urinary incontinence     Patient Active Problem List   Diagnosis Date Noted  . Dehydration   . CKD (chronic kidney disease) stage 3, GFR 30-59 ml/min 02/02/2018  . Nausea and vomiting 02/02/2018  . Nausea vomiting and diarrhea 02/01/2018  . Encounter for palliative care   . Goals of care, counseling/discussion   . Chronic systolic CHF (congestive heart failure) ([place]) 12/20/2017  . A-fib ([place]) 06/11/2017  . Cellulitis, leg 10/13/2016  . AKI (acute kidney injury) ([place]) 10/13/2016  . Hypertension   . Lymphedema of both lower extremities   . Abnormal CT of the abdomen   . Pressure injury of skin 04/28/2016  . Intractable lower abdominal pain 04/27/2016  . Constipation   . Respiratory distress   . Acute cystitis without hematuria   . Sepsis ([place]) 09/26/2014  . UTI (urinary tract infection) 09/26/2014  . Asthma 09/26/2014  . Asthma with acute exacerbation   . Blood poisoning   . Urinary tract infectious disease   . Diabetes type 2, uncontrolled ([place])   . Acute on chronic renal failure ([place])   . Essential hypertension,  benign   . Acute renal failure ([place]) 01/25/2013  . Cellulitis of leg, bilateral 01/23/2013  . Ulcers of both lower extremities ([place]) 01/23/2013  . Lymphedema 06/13/2011  . DM type 2, uncontrolled, with neuropathy ([place]) 06/23/2010  . Morbid obesity ([place]) 06/23/2010  . ANEMIA-NOS 06/23/2010  . Essential hypertension 06/23/2010  . Asthma 06/23/2010    Past Surgical History:  Procedure Laterality Date  . ABDOMINAL HYSTERECTOMY     COMPLETE     OB History   No obstetric history on file.      Home Medications    Prior to Admission medications   Medication Sig Start Date End Date Taking? Authorizing Provider  acetaminophen (TYLENOL) 325 MG tablet Take 650 mg by mouth every 6 (six) hours as needed (For pain.).    [provider]  amiodarone (PACERONE) 200 MG tablet Take 200 mg by mouth daily. 08/25/17   [provider]  bumetanide (BUMEX) 2 MG tablet Take 2 mg by mouth daily. 11/09/17   [provider]  cetirizine (ZYRTEC ALLERGY) 10 MG tablet Take 10 mg by mouth daily.    [provider]  cholestyramine light (PREVALITE) 4 g packet Take 1 packet (4 g total) by mouth 3 (three) times daily. 02/05/18   Georgette Shell, MD  cyclobenzaprine (FLEXERIL) 10 MG tablet Take 10 mg by mouth every 6 (six) hours  as needed for muscle spasms.    [provider]  fluticasone (FLONASE) 50 MCG/ACT nasal spray Place 2 sprays into both nostrils daily. Patient taking differently: Place 1 spray into both nostrils every 12 (twelve) hours.  10/20/16   Doreatha Lew, MD  guaiFENesin (MUCINEX) 600 MG 12 hr tablet Take 600 mg by mouth every 12 (twelve) hours.    [provider]  Hydrocortisone (GERHARDT'S BUTT CREAM) CREA Apply 1 application topically 2 (two) times daily. 02/05/18   Georgette Shell, MD  levothyroxine (SYNTHROID, LEVOTHROID) 50 MCG tablet Take 50 mcg by mouth daily. 11/08/17   [provider]  Melatonin 3 MG TABS Take 3 mg by  mouth at bedtime.    [provider]  metoprolol tartrate (LOPRESSOR) 25 MG tablet Take 0.5 tablets (12.5 mg total) by mouth 2 (two) times daily. 12/28/17   Alma Friendly, MD  montelukast (SINGULAIR) 10 MG tablet Take 10 mg by mouth daily. 08/24/17   [provider]  potassium chloride (K-DUR) 10 MEQ tablet Take 10 mEq by mouth 3 (three) times daily.  01/25/18   [provider]  promethazine (PHENERGAN) 25 MG tablet Take 25 mg by mouth every 6 (six) hours as needed for nausea or vomiting.    [provider]  saccharomyces boulardii (FLORASTOR) 250 MG capsule Take 1 capsule (250 mg total) by mouth 3 (three) times daily. 02/05/18   Georgette Shell, MD    Family History Family History  Adopted: Yes  Family history unknown: Yes    Social History Social History   Tobacco Use  . Smoking status: Never Smoker  . Smokeless tobacco: Never Used  Substance Use Topics  . Alcohol use: Yes    Comment: occassionally  . Drug use: No     Allergies   Ciprofloxacin   Review of Systems Review of Systems  Unable to perform ROS: Mental status change  Respiratory: Positive for shortness of breath.      Physical Exam Updated Vital Signs BP (!) 173/70 (BP Location: Right Arm)   Pulse 65   Temp 98.5 F (36.9 C) (Oral)   Resp (!) 23   SpO2 100%   Physical Exam Vitals signs and nursing note reviewed.  Constitutional:      General: She is not in acute distress.    Appearance: Normal appearance. She is ill-appearing. She is not toxic-appearing or diaphoretic.  HENT:     Nose: No congestion.     Mouth/Throat:     Pharynx: No oropharyngeal exudate or posterior oropharyngeal erythema.  Eyes:     Conjunctiva/sclera: Conjunctivae normal.     Pupils: Pupils are equal, round, and reactive to light.  Neck:     Musculoskeletal: No muscular tenderness.  Cardiovascular:     Rate and Rhythm: Normal rate.     Pulses: Normal pulses.     Heart sounds:  Murmur present.  Pulmonary:     Breath sounds: Rhonchi and rales present.  Chest:     Chest wall: No tenderness.  Abdominal:     Tenderness: There is no abdominal tenderness.  Musculoskeletal:        General: No tenderness.     Right lower leg: Edema present.     Left lower leg: Edema present.  Skin:    Capillary Refill: Capillary refill takes less than 2 seconds.  Neurological:     General: No focal deficit present.     Mental Status: She is confused.  GCS: GCS eye subscore is 4. GCS verbal subscore is 3. GCS motor subscore is 5.     Comments: Patient is following some commands when directed.       ED Treatments / Results  Labs (all labs ordered are listed, but only abnormal results are displayed) Labs Reviewed  CBC WITH DIFFERENTIAL/PLATELET - Abnormal; Notable for the following components:      Result Value   WBC 12.3 (*)    RBC 3.13 (*)    Hemoglobin 8.4 (*)    HCT 32.0 (*)    MCV 102.2 (*)    MCHC 26.3 (*)    Neutro Abs 9.8 (*)    Monocytes Absolute 1.5 (*)    All other components within normal limits  CBG MONITORING, ED - Abnormal; Notable for the following components:   Glucose-Capillary 122 (*)    All other components within normal limits  SARS CORONAVIRUS 2 BY RT PCR (HOSPITAL ORDER, [facility])  CULTURE, BLOOD (ROUTINE X 2)  CULTURE, BLOOD (ROUTINE X 2)  URINE CULTURE  LACTIC ACID, PLASMA  PROTIME-INR  BLOOD GAS, ARTERIAL  COMPREHENSIVE METABOLIC PANEL  LIPASE, BLOOD  LACTIC ACID, PLASMA  BRAIN NATRIURETIC PEPTIDE  TSH  AMMONIA  URINALYSIS, ROUTINE W REFLEX MICROSCOPIC  RAPID URINE DRUG SCREEN, HOSP PERFORMED  ETHANOL  TROPONIN I (HIGH SENSITIVITY)  TROPONIN I (HIGH SENSITIVITY)    EKG EKG Interpretation  Date/Time:  Tuesday May 13 2019 22:11:49 EDT Ventricular Rate:  67 PR Interval:    QRS Duration: 109 QT Interval:  419 QTC Calculation: 443 R Axis:   38 Text Interpretation: Sinus or ectopic atrial  rhythm Nonspecific T abnormalities, lateral leads When compared to prior, slower rate, No STEMI Confirmed by Antony Blackbird [phone]) on 05/13/2019 10:15:01 PM   Radiology Ct Head Wo Contrast  Result Date: 05/13/2019 CLINICAL DATA:  Shortness of breath, cough for 1 week and chest pressure, lower extremity edema. EXAM: CT HEAD WITHOUT CONTRAST TECHNIQUE: Contiguous axial images were obtained from the base of the skull through the vertex without intravenous contrast. COMPARISON:  Radiograph December 24, 2017 FINDINGS: Imaging quality is motion degraded. Brain: No evidence of acute infarction, hemorrhage, hydrocephalus, extra-axial collection or mass lesion/mass effect. Patchy areas of white matter hypoattenuation are most compatible with chronic microvascular angiopathy. Vascular: Atherosclerotic calcification of the carotid siphons. No hyperdense vessel. Skull: Hyperostosis frontalis interna, a benign incidental finding. No calvarial fracture or suspicious osseous lesion. No scalp swelling or hematoma. Sinuses/Orbits: Paranasal sinuses and mastoid air cells are predominantly clear. Orbits are largely obscured by motion artifact. Other: None IMPRESSION: Motion degraded images limit evaluation of the skull base and orbits. No acute intracranial abnormality. Electronically Signed   By: Lovena Le M.D.   On: 05/13/2019 23:07   Dg Chest Portable 1 View  Result Date: 05/13/2019 CLINICAL DATA:  73 year old female with chest pain and shortness of breath and cough. EXAM: PORTABLE CHEST 1 VIEW COMPARISON:  Chest radiograph dated 02/01/2018 FINDINGS: There is cardiomegaly with vascular congestion and edema. Pneumonia is not excluded. Clinical correlation is recommended. The patient is rotated which limits evaluation of the exam. No large pleural effusion or pneumothorax. No acute osseous pathology. IMPRESSION: 1. Cardiomegaly with vascular congestion and edema. Pneumonia is not excluded. 2. No large pleural effusion or  pneumothorax. Electronically Signed   By: Anner Crete M.D.   On: 05/13/2019 22:44    Procedures Procedures (including critical care time)  CRITICAL CARE Performed by: Gwenyth Allegra  Total  critical care time: 30 minutes Critical care time was exclusive of separately billable procedures and treating other patients. Critical care was necessary to treat or prevent imminent or life-threatening deterioration. Critical care was time spent personally by me on the following activities: development of treatment plan with patient and/or surrogate as well as nursing, discussions with consultants, evaluation of patient's response to treatment, examination of patient, obtaining history from patient or surrogate, ordering and performing treatments and interventions, ordering and review of laboratory studies, ordering and review of radiographic studies, pulse oximetry and re-evaluation of patient's condition.   Medications Ordered in ED Medications - No data to display   Initial Impression / Assessment and Plan / ED Course  I have reviewed the triage vital signs and the nursing notes.  Pertinent labs & imaging results that were available during my care of the patient were reviewed by me and considered in my medical decision making (see chart for details).        Caleya Ryer is a 73 y.o. female with a past medical history significant for morbid obesity, atrial fibrillation, diabetes, lymphedema, CKD, CHF, hypertension, asthma, and prior hysterectomy who presents for altered mental status, shortness of breath, chest pain, fatigue, and cough.  According to EMS report and nursing, patient has had cough for the last week as well as some chest pain shortness of breath.  Patient is reportedly not been compliant with medications or home CPAP.  She does not reportedly wear oxygen during the daytime at home.  Patient placed nonrebreather for hypoxia, chest pain, shortness of breath and was given  nitro and Zofran.  Patient arrived and is somnolent in the emergency department.  Patient is unable to answer questions occasionally making some moaning noises.  Patient had lungs that were coarse and crackles with rales bilaterally.  Chest and abdomen did not appear tender with no grimacing.  Patient slightly moved extremities when instructed to do so.  Pupils were symmetric and reactive at 3 mm.  Patient de-escalate to a nonrebreather on several liters to maintain her oxygen saturations in the mid 90s.  Patient is somnolent.  Clinically, patient is somnolent and altered.  Although there is no reported trauma, will get head imaging to look for abnormalities.  Will get work-up to look for occult infection and will also get rapid Covid test given the ongoing pandemic.  Will get chest x-ray, urine, and labs.  Given the patient's oxygen requirement, somnolence, and reported chest pain or shortness of breath, anticipate admission.  10:50 PM Respiratory report of the patient's ABG showed a CO2 of greater than 97.  When coronavirus test has returned, if it is negative, will start BiPAP.  Suspect the patient's altered mental status is encephalopathy due to hypercarbic respiratory failure.  11:16 PM CT had returned showing no acute intracranial abnormality.  Chest x-ray shows cardiomegaly with vascular congestion edema and pneumonia was not excluded.  No large effusion or pneumothorax.  Will await lab to clinically correlate concern for pneumonia.  Patient will require admission for altered mental status and hypercarbic respiratory failure.  Care transferred to Dr. Roxanne Mins while awaiting results of work-up.  Patient will require admission after labs and imaging.    As patient is following some commands and maintaining her airway, do not feel she needs intubation at time of transfer of care. hopefully if Covid test is negative, she can get BiPAP before admission. IF mental status worsens or she becomes more  hypoxic, she will likely require intubation.  Final Clinical Impressions(s) / ED Diagnoses   Final diagnoses:  Acute respiratory failure with hypoxia and hypercarbia (HCC)  Somnolence  Chest pain, unspecified type  Shortness of breath    Clinical Impression: 1. Acute respiratory failure with hypoxia and hypercarbia (HCC)   2. Somnolence   3. Chest pain, unspecified type   4. Shortness of breath     Disposition: Care transferred to Dr. Roxanne Mins while awaiting results of work-up.  Patient will require admission after labs and imaging.  This note was prepared with assistance of Systems analyst. Occasional wrong-word or sound-a-like substitutions may have occurred due to the inherent limitations of voice recognition software.        , Gwenyth Allegra, MD 05/14/19 [id number]    , Gwenyth Allegra, MD 05/19/19 1059

## 2019-05-13 NOTE — Progress Notes (Signed)
ABG results will not cross over into Epic because CO2 is out of range.  ABG results: PH 7.21, CO2>97, PAO2 61. MD aware

## 2019-05-13 NOTE — ED Triage Notes (Signed)
BIB EMS. Family called for shortness of breath, cough x 1 week and chest pressure. C/o edema in lower extremities. EMS reports sats 90% on RA, placed on nonbreather. Sats 100%. History of CHF, non compliant with meds and cpap. EMS administered 4mg  Zofran and 1 nitro prior to arrival. Pt is lethargic but oriented x 4.

## 2019-05-14 ENCOUNTER — Encounter (HOSPITAL_COMMUNITY): Payer: Self-pay | Admitting: Physician Assistant

## 2019-05-14 ENCOUNTER — Inpatient Hospital Stay (HOSPITAL_COMMUNITY): Payer: Medicare Other

## 2019-05-14 ENCOUNTER — Emergency Department (HOSPITAL_COMMUNITY): Payer: Medicare Other

## 2019-05-14 DIAGNOSIS — Z7952 Long term (current) use of systemic steroids: Secondary | ICD-10-CM | POA: Diagnosis not present

## 2019-05-14 DIAGNOSIS — N184 Chronic kidney disease, stage 4 (severe): Secondary | ICD-10-CM | POA: Diagnosis present

## 2019-05-14 DIAGNOSIS — J9602 Acute respiratory failure with hypercapnia: Secondary | ICD-10-CM | POA: Diagnosis not present

## 2019-05-14 DIAGNOSIS — Z90710 Acquired absence of both cervix and uterus: Secondary | ICD-10-CM | POA: Diagnosis not present

## 2019-05-14 DIAGNOSIS — R34 Anuria and oliguria: Secondary | ICD-10-CM | POA: Diagnosis not present

## 2019-05-14 DIAGNOSIS — J45909 Unspecified asthma, uncomplicated: Secondary | ICD-10-CM | POA: Diagnosis present

## 2019-05-14 DIAGNOSIS — L89159 Pressure ulcer of sacral region, unspecified stage: Secondary | ICD-10-CM | POA: Diagnosis not present

## 2019-05-14 DIAGNOSIS — Z7951 Long term (current) use of inhaled steroids: Secondary | ICD-10-CM | POA: Diagnosis not present

## 2019-05-14 DIAGNOSIS — Z6841 Body Mass Index (BMI) 40.0 and over, adult: Secondary | ICD-10-CM | POA: Diagnosis not present

## 2019-05-14 DIAGNOSIS — J9601 Acute respiratory failure with hypoxia: Secondary | ICD-10-CM

## 2019-05-14 DIAGNOSIS — Z79899 Other long term (current) drug therapy: Secondary | ICD-10-CM | POA: Diagnosis not present

## 2019-05-14 DIAGNOSIS — I5033 Acute on chronic diastolic (congestive) heart failure: Secondary | ICD-10-CM | POA: Diagnosis not present

## 2019-05-14 DIAGNOSIS — N289 Disorder of kidney and ureter, unspecified: Secondary | ICD-10-CM

## 2019-05-14 DIAGNOSIS — R0602 Shortness of breath: Secondary | ICD-10-CM

## 2019-05-14 DIAGNOSIS — E662 Morbid (severe) obesity with alveolar hypoventilation: Secondary | ICD-10-CM | POA: Diagnosis present

## 2019-05-14 DIAGNOSIS — F05 Delirium due to known physiological condition: Secondary | ICD-10-CM | POA: Diagnosis not present

## 2019-05-14 DIAGNOSIS — E039 Hypothyroidism, unspecified: Secondary | ICD-10-CM | POA: Diagnosis present

## 2019-05-14 DIAGNOSIS — R079 Chest pain, unspecified: Secondary | ICD-10-CM

## 2019-05-14 DIAGNOSIS — Z79890 Hormone replacement therapy (postmenopausal): Secondary | ICD-10-CM | POA: Diagnosis not present

## 2019-05-14 DIAGNOSIS — J9690 Respiratory failure, unspecified, unspecified whether with hypoxia or hypercapnia: Secondary | ICD-10-CM | POA: Diagnosis not present

## 2019-05-14 DIAGNOSIS — Z7189 Other specified counseling: Secondary | ICD-10-CM | POA: Diagnosis not present

## 2019-05-14 DIAGNOSIS — I130 Hypertensive heart and chronic kidney disease with heart failure and stage 1 through stage 4 chronic kidney disease, or unspecified chronic kidney disease: Secondary | ICD-10-CM | POA: Diagnosis present

## 2019-05-14 DIAGNOSIS — G9340 Encephalopathy, unspecified: Secondary | ICD-10-CM | POA: Diagnosis not present

## 2019-05-14 DIAGNOSIS — D539 Nutritional anemia, unspecified: Secondary | ICD-10-CM | POA: Diagnosis not present

## 2019-05-14 DIAGNOSIS — E1140 Type 2 diabetes mellitus with diabetic neuropathy, unspecified: Secondary | ICD-10-CM | POA: Diagnosis present

## 2019-05-14 DIAGNOSIS — E1129 Type 2 diabetes mellitus with other diabetic kidney complication: Secondary | ICD-10-CM | POA: Diagnosis not present

## 2019-05-14 DIAGNOSIS — E875 Hyperkalemia: Secondary | ICD-10-CM | POA: Diagnosis present

## 2019-05-14 DIAGNOSIS — Z9114 Patient's other noncompliance with medication regimen: Secondary | ICD-10-CM | POA: Diagnosis not present

## 2019-05-14 DIAGNOSIS — D649 Anemia, unspecified: Secondary | ICD-10-CM | POA: Diagnosis present

## 2019-05-14 DIAGNOSIS — R410 Disorientation, unspecified: Secondary | ICD-10-CM | POA: Diagnosis not present

## 2019-05-14 DIAGNOSIS — Z9911 Dependence on respirator [ventilator] status: Secondary | ICD-10-CM | POA: Diagnosis not present

## 2019-05-14 DIAGNOSIS — G9341 Metabolic encephalopathy: Secondary | ICD-10-CM | POA: Diagnosis present

## 2019-05-14 DIAGNOSIS — I4891 Unspecified atrial fibrillation: Secondary | ICD-10-CM | POA: Diagnosis present

## 2019-05-14 DIAGNOSIS — R1013 Epigastric pain: Secondary | ICD-10-CM | POA: Diagnosis not present

## 2019-05-14 DIAGNOSIS — H9042 Sensorineural hearing loss, unilateral, left ear, with unrestricted hearing on the contralateral side: Secondary | ICD-10-CM | POA: Diagnosis not present

## 2019-05-14 DIAGNOSIS — E872 Acidosis: Secondary | ICD-10-CM | POA: Diagnosis present

## 2019-05-14 DIAGNOSIS — J9621 Acute and chronic respiratory failure with hypoxia: Secondary | ICD-10-CM | POA: Diagnosis present

## 2019-05-14 DIAGNOSIS — Z20828 Contact with and (suspected) exposure to other viral communicable diseases: Secondary | ICD-10-CM | POA: Diagnosis present

## 2019-05-14 DIAGNOSIS — N1330 Unspecified hydronephrosis: Secondary | ICD-10-CM | POA: Diagnosis present

## 2019-05-14 DIAGNOSIS — N179 Acute kidney failure, unspecified: Secondary | ICD-10-CM | POA: Diagnosis present

## 2019-05-14 DIAGNOSIS — I5043 Acute on chronic combined systolic (congestive) and diastolic (congestive) heart failure: Secondary | ICD-10-CM | POA: Diagnosis present

## 2019-05-14 DIAGNOSIS — J9622 Acute and chronic respiratory failure with hypercapnia: Secondary | ICD-10-CM | POA: Diagnosis present

## 2019-05-14 LAB — CBC WITH DIFFERENTIAL/PLATELET
Abs Immature Granulocytes: 0.05 10*3/uL (ref 0.00–0.07)
Basophils Absolute: 0.1 10*3/uL (ref 0.0–0.1)
Basophils Relative: 0 %
Eosinophils Absolute: 0.1 10*3/uL (ref 0.0–0.5)
Eosinophils Relative: 1 %
HCT: 32 % — ABNORMAL LOW (ref 36.0–46.0)
Hemoglobin: 8.4 g/dL — ABNORMAL LOW (ref 12.0–15.0)
Immature Granulocytes: 0 %
Lymphocytes Relative: 7 %
Lymphs Abs: 0.8 10*3/uL (ref 0.7–4.0)
MCH: 26.8 pg (ref 26.0–34.0)
MCHC: 26.3 g/dL — ABNORMAL LOW (ref 30.0–36.0)
MCV: 102.2 fL — ABNORMAL HIGH (ref 80.0–100.0)
Monocytes Absolute: 1.5 10*3/uL — ABNORMAL HIGH (ref 0.1–1.0)
Monocytes Relative: 12 %
Neutro Abs: 9.8 10*3/uL — ABNORMAL HIGH (ref 1.7–7.7)
Neutrophils Relative %: 80 %
Platelets: 276 10*3/uL (ref 150–400)
RBC: 3.13 MIL/uL — ABNORMAL LOW (ref 3.87–5.11)
RDW: 13.1 % (ref 11.5–15.5)
WBC: 12.3 10*3/uL — ABNORMAL HIGH (ref 4.0–10.5)
nRBC: 0 % (ref 0.0–0.2)

## 2019-05-14 LAB — BASIC METABOLIC PANEL
Anion gap: 11 (ref 5–15)
BUN: 64 mg/dL — ABNORMAL HIGH (ref 8–23)
CO2: 35 mmol/L — ABNORMAL HIGH (ref 22–32)
Calcium: 9.4 mg/dL (ref 8.9–10.3)
Chloride: 97 mmol/L — ABNORMAL LOW (ref 98–111)
Creatinine, Ser: 3.09 mg/dL — ABNORMAL HIGH (ref 0.44–1.00)
GFR calc Af Amer: 16 mL/min — ABNORMAL LOW (ref >60)
GFR calc non Af Amer: 14 mL/min — ABNORMAL LOW (ref >60)
Glucose, Bld: 119 mg/dL — ABNORMAL HIGH (ref 70–99)
Potassium: 5.6 mmol/L — ABNORMAL HIGH (ref 3.5–5.1)
Sodium: 143 mmol/L (ref 135–145)

## 2019-05-14 LAB — CBC
HCT: 30 % — ABNORMAL LOW (ref 36.0–46.0)
Hemoglobin: 8.1 g/dL — ABNORMAL LOW (ref 12.0–15.0)
MCH: 26.6 pg (ref 26.0–34.0)
MCHC: 27 g/dL — ABNORMAL LOW (ref 30.0–36.0)
MCV: 98.7 fL (ref 80.0–100.0)
Platelets: 232 10*3/uL (ref 150–400)
RBC: 3.04 MIL/uL — ABNORMAL LOW (ref 3.87–5.11)
RDW: 13 % (ref 11.5–15.5)
WBC: 13 10*3/uL — ABNORMAL HIGH (ref 4.0–10.5)
nRBC: 0 % (ref 0.0–0.2)

## 2019-05-14 LAB — HEMOGLOBIN A1C
Hgb A1c MFr Bld: 6.7 % — ABNORMAL HIGH (ref 4.8–5.6)
Mean Plasma Glucose: 145.59 mg/dL

## 2019-05-14 LAB — BRAIN NATRIURETIC PEPTIDE: B Natriuretic Peptide: 249.9 pg/mL — ABNORMAL HIGH (ref 0.0–100.0)

## 2019-05-14 LAB — COMPREHENSIVE METABOLIC PANEL
ALT: 12 U/L (ref 0–44)
AST: 15 U/L (ref 15–41)
Albumin: 3.2 g/dL — ABNORMAL LOW (ref 3.5–5.0)
Alkaline Phosphatase: 71 U/L (ref 38–126)
Anion gap: 11 (ref 5–15)
BUN: 61 mg/dL — ABNORMAL HIGH (ref 8–23)
CO2: 34 mmol/L — ABNORMAL HIGH (ref 22–32)
Calcium: 9.3 mg/dL (ref 8.9–10.3)
Chloride: 97 mmol/L — ABNORMAL LOW (ref 98–111)
Creatinine, Ser: 3 mg/dL — ABNORMAL HIGH (ref 0.44–1.00)
GFR calc Af Amer: 17 mL/min — ABNORMAL LOW (ref >60)
GFR calc non Af Amer: 15 mL/min — ABNORMAL LOW (ref >60)
Glucose, Bld: 117 mg/dL — ABNORMAL HIGH (ref 70–99)
Potassium: 5.6 mmol/L — ABNORMAL HIGH (ref 3.5–5.1)
Sodium: 142 mmol/L (ref 135–145)
Total Bilirubin: 0.5 mg/dL (ref 0.3–1.2)
Total Protein: 7.6 g/dL (ref 6.5–8.1)

## 2019-05-14 LAB — TROPONIN I (HIGH SENSITIVITY)
Troponin I (High Sensitivity): 42 ng/L — ABNORMAL HIGH (ref <18)
Troponin I (High Sensitivity): 42 ng/L — ABNORMAL HIGH (ref <18)

## 2019-05-14 LAB — RESPIRATORY PANEL BY PCR

## 2019-05-14 LAB — POCT I-STAT 7, (LYTES, BLD GAS, ICA,H+H)
Acid-Base Excess: 12 mmol/L — ABNORMAL HIGH (ref 0.0–2.0)
Bicarbonate: 38.3 mmol/L — ABNORMAL HIGH (ref 20.0–28.0)
Calcium, Ion: 1.24 mmol/L (ref 1.15–1.40)
HCT: 25 % — ABNORMAL LOW (ref 36.0–46.0)
Hemoglobin: 8.5 g/dL — ABNORMAL LOW (ref 12.0–15.0)
O2 Saturation: 98 %
Patient temperature: 97.3
Potassium: 5.5 mmol/L — ABNORMAL HIGH (ref 3.5–5.1)
Sodium: 141 mmol/L (ref 135–145)
TCO2: 40 mmol/L — ABNORMAL HIGH (ref 22–32)
pCO2 arterial: 62.7 mmHg — ABNORMAL HIGH (ref 32.0–48.0)
pH, Arterial: 7.39 (ref 7.350–7.450)
pO2, Arterial: 98 mmHg (ref 83.0–108.0)

## 2019-05-14 LAB — URINALYSIS, ROUTINE W REFLEX MICROSCOPIC
Bacteria, UA: NONE SEEN
Bilirubin Urine: NEGATIVE
Glucose, UA: NEGATIVE mg/dL
Ketones, ur: NEGATIVE mg/dL
Nitrite: NEGATIVE
Protein, ur: 100 mg/dL — AB
Specific Gravity, Urine: 1.013 (ref 1.005–1.030)
WBC, UA: >50 WBC/hpf — ABNORMAL HIGH (ref 0–5)
pH: 5 (ref 5.0–8.0)

## 2019-05-14 LAB — SARS CORONAVIRUS 2 BY RT PCR (HOSPITAL ORDER, PERFORMED IN ~~LOC~~ HOSPITAL LAB): SARS Coronavirus 2: NEGATIVE

## 2019-05-14 LAB — LACTIC ACID, PLASMA: Lactic Acid, Venous: 0.8 mmol/L (ref 0.5–1.9)

## 2019-05-14 LAB — RAPID URINE DRUG SCREEN, HOSP PERFORMED
Amphetamines: NOT DETECTED
Barbiturates: NOT DETECTED
Benzodiazepines: POSITIVE — AB
Cocaine: NOT DETECTED
Opiates: NOT DETECTED
Tetrahydrocannabinol: NOT DETECTED

## 2019-05-14 LAB — AMMONIA: Ammonia: 55 umol/L — ABNORMAL HIGH (ref 9–35)

## 2019-05-14 LAB — GLUCOSE, CAPILLARY
Glucose-Capillary: 107 mg/dL — ABNORMAL HIGH (ref 70–99)
Glucose-Capillary: 90 mg/dL (ref 70–99)
Glucose-Capillary: 69 mg/dL — ABNORMAL LOW (ref 70–99)
Glucose-Capillary: 74 mg/dL (ref 70–99)
Glucose-Capillary: 82 mg/dL (ref 70–99)

## 2019-05-14 LAB — PROTIME-INR
INR: 1 (ref 0.8–1.2)
Prothrombin Time: 13.6 seconds (ref 11.4–15.2)

## 2019-05-14 LAB — MRSA PCR SCREENING: MRSA by PCR: NEGATIVE

## 2019-05-14 LAB — ETHANOL: Alcohol, Ethyl (B): <10 mg/dL (ref <10)

## 2019-05-14 LAB — LIPASE, BLOOD: Lipase: 34 U/L (ref 11–51)

## 2019-05-14 LAB — ECHOCARDIOGRAM COMPLETE
Height: 67 in
Weight: 5918.91 oz

## 2019-05-14 LAB — TSH: TSH: 9.151 u[IU]/mL — ABNORMAL HIGH (ref 0.350–4.500)

## 2019-05-14 LAB — POTASSIUM: Potassium: 5.3 mmol/L — ABNORMAL HIGH (ref 3.5–5.1)

## 2019-05-14 LAB — PROCALCITONIN: Procalcitonin: <0.1 ng/mL

## 2019-05-14 MED ORDER — PERFLUTREN LIPID MICROSPHERE
1.0000 mL | INTRAVENOUS | Status: AC | PRN
  Administered 2019-05-14: 3 mL via INTRAVENOUS
  Filled 2019-05-14: qty 10

## 2019-05-14 MED ORDER — PRO-STAT SUGAR FREE PO LIQD
30.0000 mL | Freq: Four times a day (QID) | ORAL | Status: DC
  Administered 2019-05-14 – 2019-05-19 (×20): 30 mL
  Filled 2019-05-14 (×20): qty 30

## 2019-05-14 MED ORDER — FENTANYL CITRATE (PF) 100 MCG/2ML IJ SOLN
INTRAMUSCULAR | Status: AC
  Filled 2019-05-14: qty 2

## 2019-05-14 MED ORDER — HEPARIN SODIUM (PORCINE) 5000 UNIT/ML IJ SOLN
5000.0000 [IU] | Freq: Three times a day (TID) | INTRAMUSCULAR | Status: DC
  Administered 2019-05-14 – 2019-05-31 (×51): 5000 [IU] via SUBCUTANEOUS
  Filled 2019-05-14 (×52): qty 1

## 2019-05-14 MED ORDER — MIDAZOLAM HCL 2 MG/2ML IJ SOLN
5.0000 mg | Freq: Once | INTRAMUSCULAR | Status: AC
  Administered 2019-05-14: 5 mg via INTRAVENOUS
  Filled 2019-05-14: qty 6

## 2019-05-14 MED ORDER — FENTANYL 2500MCG IN NS 250ML (10MCG/ML) PREMIX INFUSION
25.0000 ug/h | INTRAVENOUS | Status: DC
  Administered 2019-05-14: 25 ug/h via INTRAVENOUS
  Administered 2019-05-14 – 2019-05-16 (×5): 200 ug/h via INTRAVENOUS
  Administered 2019-05-17: 150 ug/h via INTRAVENOUS
  Administered 2019-05-17: 200 ug/h via INTRAVENOUS
  Filled 2019-05-14 (×10): qty 250

## 2019-05-14 MED ORDER — MIDAZOLAM HCL 2 MG/2ML IJ SOLN
1.0000 mg | INTRAMUSCULAR | Status: AC | PRN
  Administered 2019-05-14 (×3): 1 mg via INTRAVENOUS
  Filled 2019-05-14: qty 2

## 2019-05-14 MED ORDER — SODIUM CHLORIDE 0.9 % IV SOLN
2.0000 g | Freq: Every day | INTRAVENOUS | Status: DC
  Administered 2019-05-14 – 2019-05-15 (×3): 2 g via INTRAVENOUS
  Filled 2019-05-14 (×3): qty 20

## 2019-05-14 MED ORDER — FENTANYL CITRATE (PF) 100 MCG/2ML IJ SOLN
25.0000 ug | Freq: Once | INTRAMUSCULAR | Status: AC
  Administered 2019-05-14: 25 ug via INTRAVENOUS

## 2019-05-14 MED ORDER — CHLORHEXIDINE GLUCONATE 0.12% ORAL RINSE (MEDLINE KIT)
15.0000 mL | Freq: Two times a day (BID) | OROMUCOSAL | Status: DC
  Administered 2019-05-14 – 2019-05-19 (×11): 15 mL via OROMUCOSAL

## 2019-05-14 MED ORDER — ORAL CARE MOUTH RINSE
15.0000 mL | OROMUCOSAL | Status: DC
  Administered 2019-05-14 – 2019-05-19 (×50): 15 mL via OROMUCOSAL

## 2019-05-14 MED ORDER — LEVOTHYROXINE SODIUM 125 MCG PO TABS
62.5000 ug | ORAL_TABLET | Freq: Every day | ORAL | Status: DC
  Administered 2019-05-15 – 2019-05-17 (×3): 62.5 ug
  Filled 2019-05-14 (×3): qty 0.5

## 2019-05-14 MED ORDER — FUROSEMIDE 10 MG/ML IJ SOLN
40.0000 mg | Freq: Once | INTRAMUSCULAR | Status: AC
  Administered 2019-05-14: 40 mg via INTRAVENOUS
  Filled 2019-05-14: qty 4

## 2019-05-14 MED ORDER — LEVOTHYROXINE SODIUM 50 MCG PO TABS
50.0000 ug | ORAL_TABLET | Freq: Every day | ORAL | Status: DC
  Administered 2019-05-14: 50 ug
  Filled 2019-05-14: qty 1

## 2019-05-14 MED ORDER — DEXMEDETOMIDINE HCL IN NACL 400 MCG/100ML IV SOLN
0.4000 ug/kg/h | INTRAVENOUS | Status: DC
  Administered 2019-05-14 (×2): 0.4 ug/kg/h via INTRAVENOUS
  Administered 2019-05-14: 0.6 ug/kg/h via INTRAVENOUS
  Administered 2019-05-15 (×2): 0.5 ug/kg/h via INTRAVENOUS
  Administered 2019-05-15: 0.4 ug/kg/h via INTRAVENOUS
  Administered 2019-05-15 (×2): 0.5 ug/kg/h via INTRAVENOUS
  Administered 2019-05-16: 0.501 ug/kg/h via INTRAVENOUS
  Administered 2019-05-16: 0.6 ug/kg/h via INTRAVENOUS
  Administered 2019-05-16: 0.5 ug/kg/h via INTRAVENOUS
  Administered 2019-05-16 (×2): 0.8 ug/kg/h via INTRAVENOUS
  Administered 2019-05-16: 0.6 ug/kg/h via INTRAVENOUS
  Administered 2019-05-16: 0.7 ug/kg/h via INTRAVENOUS
  Administered 2019-05-17: 0.5 ug/kg/h via INTRAVENOUS
  Administered 2019-05-17: 06:00:00 0.6 ug/kg/h via INTRAVENOUS
  Administered 2019-05-17: 1.2 ug/kg/h via INTRAVENOUS
  Administered 2019-05-17: 0.7 ug/kg/h via INTRAVENOUS
  Administered 2019-05-17: 0.5 ug/kg/h via INTRAVENOUS
  Administered 2019-05-17: 09:00:00 1.2 ug/kg/h via INTRAVENOUS
  Administered 2019-05-18 (×2): 0.4 ug/kg/h via INTRAVENOUS
  Filled 2019-05-14 (×24): qty 100

## 2019-05-14 MED ORDER — ETOMIDATE 2 MG/ML IV SOLN
INTRAVENOUS | Status: AC | PRN
  Administered 2019-05-14: 20 mg via INTRAVENOUS

## 2019-05-14 MED ORDER — FENTANYL BOLUS VIA INFUSION
25.0000 ug | INTRAVENOUS | Status: DC | PRN
  Administered 2019-05-14 – 2019-05-17 (×8): 25 ug via INTRAVENOUS
  Filled 2019-05-14: qty 25

## 2019-05-14 MED ORDER — SODIUM ZIRCONIUM CYCLOSILICATE 10 G PO PACK
10.0000 g | PACK | Freq: Once | ORAL | Status: AC
  Administered 2019-05-14: 10 g via ORAL
  Filled 2019-05-14: qty 1

## 2019-05-14 MED ORDER — PERFLUTREN LIPID MICROSPHERE
INTRAVENOUS | Status: AC
  Filled 2019-05-14: qty 10

## 2019-05-14 MED ORDER — VITAL AF 1.2 CAL PO LIQD
1000.0000 mL | ORAL | Status: DC
  Administered 2019-05-14 – 2019-05-18 (×5): 1000 mL

## 2019-05-14 MED ORDER — CHLORHEXIDINE GLUCONATE CLOTH 2 % EX PADS
6.0000 | MEDICATED_PAD | Freq: Every day | CUTANEOUS | Status: DC
  Administered 2019-05-14 – 2019-05-31 (×12): 6 via TOPICAL

## 2019-05-14 MED ORDER — SODIUM CHLORIDE 0.9 % IV SOLN
500.0000 mg | Freq: Every day | INTRAVENOUS | Status: DC
  Filled 2019-05-14 (×2): qty 500

## 2019-05-14 MED ORDER — PROPOFOL 1000 MG/100ML IV EMUL
5.0000 ug/kg/min | INTRAVENOUS | Status: DC
  Administered 2019-05-14: 5 ug/kg/min via INTRAVENOUS

## 2019-05-14 MED ORDER — INSULIN ASPART 100 UNIT/ML ~~LOC~~ SOLN
0.0000 [IU] | SUBCUTANEOUS | Status: DC
  Administered 2019-05-15: 3 [IU] via SUBCUTANEOUS
  Administered 2019-05-15: 2 [IU] via SUBCUTANEOUS
  Administered 2019-05-15 – 2019-05-16 (×8): 3 [IU] via SUBCUTANEOUS
  Administered 2019-05-16: 12:00:00 5 [IU] via SUBCUTANEOUS
  Administered 2019-05-16: 2 [IU] via SUBCUTANEOUS
  Administered 2019-05-17 (×5): 3 [IU] via SUBCUTANEOUS
  Administered 2019-05-17: 2 [IU] via SUBCUTANEOUS
  Administered 2019-05-18 – 2019-05-19 (×6): 3 [IU] via SUBCUTANEOUS
  Administered 2019-05-19 (×2): 2 [IU] via SUBCUTANEOUS
  Administered 2019-05-19: 3 [IU] via SUBCUTANEOUS
  Administered 2019-05-21 – 2019-05-24 (×9): 2 [IU] via SUBCUTANEOUS
  Administered 2019-05-24: 3 [IU] via SUBCUTANEOUS
  Administered 2019-05-24 – 2019-05-25 (×5): 2 [IU] via SUBCUTANEOUS
  Administered 2019-05-26: 3 [IU] via SUBCUTANEOUS
  Administered 2019-05-26 (×3): 2 [IU] via SUBCUTANEOUS
  Administered 2019-05-26: 3 [IU] via SUBCUTANEOUS
  Administered 2019-05-27 – 2019-05-31 (×9): 2 [IU] via SUBCUTANEOUS

## 2019-05-14 MED ORDER — BUMETANIDE 2 MG PO TABS
2.0000 mg | ORAL_TABLET | Freq: Every day | ORAL | Status: DC
  Administered 2019-05-14 – 2019-05-15 (×2): 2 mg
  Filled 2019-05-14 (×3): qty 1

## 2019-05-14 MED ORDER — SODIUM CHLORIDE 0.9 % IV SOLN
INTRAVENOUS | Status: DC | PRN
  Administered 2019-05-14 – 2019-05-22 (×2): via INTRAVENOUS

## 2019-05-14 MED ORDER — MIDAZOLAM HCL 2 MG/2ML IJ SOLN
1.0000 mg | INTRAMUSCULAR | Status: DC | PRN
  Administered 2019-05-14 – 2019-05-18 (×10): 1 mg via INTRAVENOUS
  Filled 2019-05-14 (×9): qty 2

## 2019-05-14 MED ORDER — PROPOFOL 1000 MG/100ML IV EMUL
INTRAVENOUS | Status: AC
  Filled 2019-05-14: qty 100

## 2019-05-14 MED ORDER — SODIUM ZIRCONIUM CYCLOSILICATE 10 G PO PACK
10.0000 g | PACK | Freq: Once | ORAL | Status: DC
  Filled 2019-05-14: qty 1

## 2019-05-14 MED ORDER — BUMETANIDE 2 MG PO TABS
2.0000 mg | ORAL_TABLET | Freq: Every day | ORAL | Status: DC
  Filled 2019-05-14: qty 1

## 2019-05-14 MED ORDER — PANTOPRAZOLE SODIUM 40 MG IV SOLR
40.0000 mg | Freq: Two times a day (BID) | INTRAVENOUS | Status: DC
  Administered 2019-05-14 – 2019-05-15 (×4): 40 mg via INTRAVENOUS
  Filled 2019-05-14 (×3): qty 40

## 2019-05-14 MED ORDER — ALBUTEROL SULFATE (2.5 MG/3ML) 0.083% IN NEBU
2.5000 mg | INHALATION_SOLUTION | RESPIRATORY_TRACT | Status: DC | PRN
  Administered 2019-05-26 – 2019-05-31 (×3): 2.5 mg via RESPIRATORY_TRACT
  Filled 2019-05-14 (×4): qty 3

## 2019-05-14 MED ORDER — MIDAZOLAM HCL 2 MG/2ML IJ SOLN
INTRAMUSCULAR | Status: AC
  Filled 2019-05-14: qty 2

## 2019-05-14 MED ORDER — SUCCINYLCHOLINE CHLORIDE 20 MG/ML IJ SOLN
INTRAMUSCULAR | Status: AC | PRN
  Administered 2019-05-14: 150 mg via INTRAVENOUS

## 2019-05-14 MED ORDER — PANTOPRAZOLE SODIUM 40 MG IV SOLR
40.0000 mg | Freq: Every day | INTRAVENOUS | Status: DC
  Filled 2019-05-14: qty 40

## 2019-05-14 MED ORDER — SODIUM CHLORIDE 0.9 % IV SOLN
500.0000 mg | Freq: Every day | INTRAVENOUS | Status: AC
  Administered 2019-05-14 – 2019-05-16 (×3): 500 mg via INTRAVENOUS
  Filled 2019-05-14 (×3): qty 500

## 2019-05-14 MED ORDER — LEVOTHYROXINE SODIUM 50 MCG PO TABS: 50.0000 ug | ORAL_TABLET | Freq: Every day | ORAL | Status: DC

## 2019-05-14 NOTE — ED Notes (Signed)
CoVid Swab collected by Lattie Haw, RN

## 2019-05-14 NOTE — Progress Notes (Signed)
Blood tinged oral contents noted in og.   Starting BID ppi.    Also noted elevated K. Recheck after diuresis pending if remains elevated will give kayexalate.

## 2019-05-14 NOTE — Progress Notes (Signed)
eLink Physician-Brief Progress Note Patient Name: Jodi Underwood DOB: [birth year] MRN: [id number]   Date of Service  05/14/2019  HPI/Events of Note  31 F morbidly obese (BMI 58), chronic hypercapnea non-compliant with CPAP, DM, afib, CKD, hypothyroidism presented with SOB, cough and chest pressure for 1 week. I the ED was somnolent with PCO2 100s and desaturating was intubated.  eICU Interventions   Pneumonia vs pulmonary edema on antibiotics and being diuresed  Now seen awake and responding appropriately. SBT in am and hopefully will be able to liberate from vent possibly to NIV     Intervention Category Major Interventions: Respiratory failure - evaluation and management Evaluation Type: New Patient Evaluation  Judd Lien 05/14/2019, 2:55 AM

## 2019-05-14 NOTE — Progress Notes (Signed)
Initial Nutrition Assessment RD working remotely.  DOCUMENTATION CODES:   Morbid obesity  INTERVENTION:    Vital AF 1.2 at 40 ml/h (960 ml per day)   Pro-stat 30 ml QID   Provides 1552 kcal, 132 gm protein, 779 ml free water daily  NUTRITION DIAGNOSIS:   Inadequate oral intake related to inability to eat as evidenced by NPO status.  GOAL:   Provide needs based on ASPEN/SCCM guidelines  MONITOR:   Vent status, TF tolerance, Labs, Skin, I & O's  REASON FOR ASSESSMENT:   Ventilator, Consult Enteral/tube feeding initiation and management  ASSESSMENT:   73 yo female admitted with SOB and worsening LE edema, suspected CHF exacerbation, required intubation. PMH includes asthma, DM, HTN, CHF, neuropathy, lymphedema, A fib, CKD, morbid obesity.   Received MD Consult for TF initiation and management. OG tube in place.  Patient is currently intubated on ventilator support MV: 9.9 L/min Temp (24hrs), Avg:98.4 F (36.9 C), Min:98.2 F (36.8 C), Max:98.5 F (36.9 C)   Labs reviewed. Potassium 5.6 (H), BUN 64 (H), creatinine 3.09 (H) Baseline creatinine ~3. Rechecking K and may require Kayexalate. CBG's: 122-107-90  Medications reviewed and include novolog, fentanyl.  Per RN documentation, +2 moderate pitting edema to BUE and +3 deep pitting edema to BLE this morning.  NUTRITION - FOCUSED PHYSICAL EXAM:  unable to complete  Diet Order:   Diet Order            Diet NPO time specified  Diet effective now              EDUCATION NEEDS:   Not appropriate for education at this time  Skin:  Skin Assessment: Skin Integrity Issues: Skin Integrity Issues:: DTI DTI: R buttocks  Last BM:  10/28  Height:   Ht Readings from Last 1 Encounters:  05/14/19 5\' 7"  (1.702 m)    Weight:   Wt Readings from Last 1 Encounters:  05/14/19 (!) 167.8 kg    Ideal Body Weight:  61.4 kg  BMI:  Body mass index is 57.94 kg/m.  Estimated Nutritional Needs:   Kcal:   [id number]  Protein:  130-150 gm  Fluid:  1.6-1.8 L    Molli Barrows, RD, LDN, [place] Pager [phone] After Hours Pager [phone]

## 2019-05-14 NOTE — H&P (Addendum)
NAME:  Jodi Underwood, MRN:  [id number], DOB:  Jun 13, 1946, LOS: 0 ADMISSION DATE:  05/13/2019, CONSULTATION DATE:  05/14/19 REFERRING MD: Dr. Roxanne Mins , CHIEF COMPLAINT:  Hypercarbic Respiratory Failure   Brief History   73 year old female with history significant for obesity, OSA, CKD who was brought in by family for 1 week of dyspnea, edema, cough and chest pressure.  ABG in the ED showed respiratory acidosis with CO2 >97 she was intubated and PCCM consulted for admission.  History of present illness   73 year old female with past medical history of morbid obesity, OSA, hypertension, type 2 diabetes, atrial fibrillation,  Asthma, CKD stage IV, heart failure, hypothyroidism who presents with 1 week of shortness of breath, cough and chest pressure.  Patient intubated in the ED and unable to reach family so history taken from epic.  Was brought in by family who noted worsening lower extremity edema and that patient is noncompliant with her medications and CPAP.  This arrived patient's oxygen saturation was 90% on room air.   On arrival, she was awake but somnolent and disoriented; she was afebrile, blood pressure 173/70 and not hypoxic.  An ABG was drawn, which did not crossover into epic, but results noted by RT: PH 7.21, CO2>97, PAO2 61.  An order was placed for BiPAP, but this is not placed pending Covid results.  Patient began to decompensate and was intubated in the emergency department.  Labs were significant for white blood cell count of 12.3, hemoglobin 8.4, creatinine 3.0 (baseline), Covid-19 negative, BNP 249, and troponin 42.   Chest x-ray with likely volume overload, but unable to rule out pneumonia.  Past Medical History   Past Medical History:  Diagnosis Date  . Anemia   . Arthritis   . Asthma   . Atrial fibrillation ([place])   . CKD (chronic kidney disease)   . Degenerative disc disease   . Diabetes mellitus   . Diabetic neuropathy ([place])   . Edema extremities   . Hypertension    . Lymphedema   . Morbid obesity ([place])   . OSA (obstructive sleep apnea)   . Pneumonia   . Urinary incontinence      Significant Hospital Events   12/28 Admit to PCCM  Consults:    Procedures:  ETT 10/28-  Significant Diagnostic Tests:  10/28 CT head>>no acute intracranial findings  Micro Data:  10/28 Sars-CoV-2>>neg  Antimicrobials:    Interim history/subjective:  Pt intubated and sedated  Objective   Blood pressure (!) 173/70, pulse 65, temperature 98.5 F (36.9 C), temperature source Oral, resp. rate (!) 23, height 5\' 7"  (1.702 m), weight (!) 181.4 kg, SpO2 100 %.    Vent Mode: PRVC FiO2 (%):  [100 %] 100 % Set Rate:  [26 bmp] 26 bmp Vt Set:  [430 mL] 430 mL PEEP:  [5 cmH20] 5 cmH20 Plateau Pressure:  [35 cmH20] 35 cmH20  No intake or output data in the 24 hours ending 05/14/19 0106 Filed Weights   05/14/19 0048  Weight: (!) 181.4 kg    General:  Morbidly obese F, intubated and sedated HEENT: MM pink/moist, ETT tube in place Neuro: opens eyes, not otherwise responsive CV: s1s2 rrr, no m/r/g PULM:  rhonchi breath sounds bilaterally, no wheezing GI: soft, bsx4 active  Extremities: warm/dry, 3+ edema  Skin: no rashes or lesions   Resolved Hospital Problem list     Assessment & Plan:  Acute hypercarbic respiratory failure -Suspect this is secondary to volume overload with obesity hypoventilation and  noncompliant CPAP use  -Intubated in the ED P: -Fentanyl and Versed to maintain RASS goal 0 to -1 -Maintain full vent support with SAT/SBT as tolerated -titrate Vent setting to maintain SpO2 greater than or equal to 90%. -HOB elevated 30 degrees. -Plateau pressures less than 30 cm H20.  -Follow repeat chest x-ray, ABG    -Bronchial hygiene and RT/bronchodilator protocol. -Given history of cough with leukocytosis and CXR unable to rule out infiltrate will cover with ceftriaxone/azith and obtain sputum cultures, respiratory viral panel and check  procalcitonin    Chest pressure, history of hypertension, presumed HF, volume overload, history of atrial fibrillation -High-sensitivity troponin 40, no changes on EKG suggestive of ACS -Last echo in 2018 with EF 50%  Hypokinesis of the anteroseptal, inferior, and inferoseptal myocardium P: -Obtain echocardiogram and trend troponin -Lasix 40 mg IV x1, monitor urine output and daily weight, on Bumex at home -BP dropped after admission, SBP 80's likely due to propofol. Patient in sinus rhythm currently, hold home amiodarone and metoprolol overnight -Pt was anti-coagulated Coumadin, stopped last year per daughter  Type 2 diabetes -Pt is "diet-controlled" per daughter P: -Sliding scale insulin -Check A1c  Hypothyroidism -Continue Synthroid   Stage III CKD -Creatinine 3.0, which appears baseline -K5.6 P: -Avoid nephrotoxic medications, repeat edema and monitor K after Lasix  Generalized Deconditioning -per daughter pt lives with family and is mentally very sharp, but is afraid of falling so rarely gets out of the chair, they have PT coming once per week -PT consult once pt is extubated and awake   Best practice:  Diet: N.p.o. Pain/Anxiety/Delirium protocol (if indicated): Fentanyl/Versed VAP protocol (if indicated): Yes  DVT prophylaxis: Heparin and SCD GI prophylaxis: Protonix Glucose control: Sliding scale insulin, home Lantus Mobility: Bedrest Code Status: Full Family Communication: spoke to daughter Gilmore Laroche and updated her on ICU txfr Disposition: ICU  Labs   CBC: Recent Labs  Lab 05/13/19 2346  WBC 12.3*  NEUTROABS 9.8*  HGB 8.4*  HCT 32.0*  MCV 102.2*  PLT [id number]    Basic Metabolic Panel: Recent Labs  Lab 05/13/19 2346  NA 142  K 5.6*  CL 97*  CO2 34*  GLUCOSE 117*  BUN 61*  CREATININE 3.00*  CALCIUM 9.3   GFR: Estimated Creatinine Clearance: 28.4 mL/min (A) (by C-G formula based on SCr of 3 mg/dL (H)). Recent Labs  Lab 05/13/19 2346  WBC  12.3*  LATICACIDVEN 0.8    Liver Function Tests: Recent Labs  Lab 05/13/19 2346  AST 15  ALT 12  ALKPHOS 71  BILITOT 0.5  PROT 7.6  ALBUMIN 3.2*   Recent Labs  Lab 05/13/19 2346  LIPASE 34   Recent Labs  Lab 05/13/19 2346  AMMONIA 55*    ABG    Component Value Date/Time   PHART 7.217 (L) 12/24/2017 0955   PCO2ART 45.5 12/24/2017 0955   PO2ART 60.4 (L) 12/24/2017 0955   HCO3 17.8 (L) 12/24/2017 0955   ACIDBASEDEF 9.1 (H) 12/24/2017 0955   O2SAT 89.0 12/24/2017 0955     Coagulation Profile: Recent Labs  Lab 05/13/19 2346  INR 1.0    Cardiac Enzymes: No results for input(s): CKTOTAL, CKMB, CKMBINDEX, TROPONINI in the last 168 hours.  HbA1C: Hgb A1c MFr Bld  Date/Time Value Ref Range Status  12/21/2017 04:15 AM 6.1 (H) 4.8 - 5.6 % Final    Comment:    (NOTE) Pre diabetes:          5.7%-6.4% Diabetes:              >  6.4% Glycemic control for   <7.0% adults with diabetes   06/11/2017 04:23 AM 7.4 (H) 4.8 - 5.6 % Final    Comment:    (NOTE)         Prediabetes: 5.7 - 6.4         Diabetes: >6.4         Glycemic control for adults with diabetes: <7.0     CBG: Recent Labs  Lab 05/13/19 2305  GLUCAP 122*    Review of Systems:   Unable to obtain due to altered mental status  Past Medical History  She,  has a past medical history of Anemia, Arthritis, Asthma, Atrial fibrillation ([place]), Degenerative disc disease, Diabetes mellitus, Diabetic neuropathy ([place]), Edema extremities, Hypertension, Lymphedema, Pneumonia, and Urinary incontinence.   Surgical History    Past Surgical History:  Procedure Laterality Date  . ABDOMINAL HYSTERECTOMY     COMPLETE     Social History   reports that she has never smoked. She has never used smokeless tobacco. She reports current alcohol use. She reports that she does not use drugs.   Family History   Her She was adopted. Family history is unknown by patient.   Allergies Allergies  Allergen Reactions  .  Ciprofloxacin Other (See Comments)    Hallucinations     Home Medications  Prior to Admission medications   Medication Sig Start Date End Date Taking? Authorizing Provider  acetaminophen (TYLENOL) 325 MG tablet Take 650 mg by mouth every 6 (six) hours as needed (For pain.).    [provider]  amiodarone (PACERONE) 200 MG tablet Take 200 mg by mouth daily. 08/25/17   [provider]  bumetanide (BUMEX) 2 MG tablet Take 2 mg by mouth daily. 11/09/17   [provider]  cetirizine (ZYRTEC ALLERGY) 10 MG tablet Take 10 mg by mouth daily.    [provider]  cholestyramine light (PREVALITE) 4 g packet Take 1 packet (4 g total) by mouth 3 (three) times daily. 02/05/18   Georgette Shell, MD  cyclobenzaprine (FLEXERIL) 10 MG tablet Take 10 mg by mouth every 6 (six) hours as needed for muscle spasms.    [provider]  fluticasone (FLONASE) 50 MCG/ACT nasal spray Place 2 sprays into both nostrils daily. Patient taking differently: Place 1 spray into both nostrils every 12 (twelve) hours.  10/20/16   Doreatha Lew, MD  guaiFENesin (MUCINEX) 600 MG 12 hr tablet Take 600 mg by mouth every 12 (twelve) hours.    [provider]  Hydrocortisone (GERHARDT'S BUTT CREAM) CREA Apply 1 application topically 2 (two) times daily. 02/05/18   Georgette Shell, MD  levothyroxine (SYNTHROID, LEVOTHROID) 50 MCG tablet Take 50 mcg by mouth daily. 11/08/17   [provider]  Melatonin 3 MG TABS Take 3 mg by mouth at bedtime.    [provider]  metoprolol tartrate (LOPRESSOR) 25 MG tablet Take 0.5 tablets (12.5 mg total) by mouth 2 (two) times daily. 12/28/17   Alma Friendly, MD  montelukast (SINGULAIR) 10 MG tablet Take 10 mg by mouth daily. 08/24/17   [provider]  potassium chloride (K-DUR) 10 MEQ tablet Take 10 mEq by mouth 3 (three) times daily.  01/25/18   [provider]  promethazine (PHENERGAN) 25 MG tablet Take  25 mg by mouth every 6 (six) hours as needed for nausea or vomiting.    [provider]  saccharomyces boulardii (FLORASTOR) 250 MG capsule Take 1 capsule (250 mg total)  by mouth 3 (three) times daily. 02/05/18   Georgette Shell, MD     Critical care time: 70 minutes      CRITICAL CARE Performed by: Otilio Carpen Christoph Copelan   Total critical care time: 70 minutes  Critical care time was exclusive of separately billable procedures and treating other patients.  Critical care was necessary to treat or prevent imminent or life-threatening deterioration.  Critical care was time spent personally by me on the following activities: development of treatment plan with patient and/or surrogate as well as nursing, discussions with consultants, evaluation of patient's response to treatment, examination of patient, obtaining history from patient or surrogate, ordering and performing treatments and interventions, ordering and review of laboratory studies, ordering and review of radiographic studies, pulse oximetry and re-evaluation of patient's condition.   Otilio Carpen Krithika Tome, PA-C [facility]  Pager# [phone], if no answer [phone]

## 2019-05-14 NOTE — Progress Notes (Signed)
  Echocardiogram 2D Echocardiogram has been performed.  Jodi Underwood 05/14/2019, 3:49 PM

## 2019-05-14 NOTE — ED Provider Notes (Signed)
Patient signed out to me with hypercarbic respiratory failure pending COVID-19 screen in order to place patient on BiPAP.  While observing patient, it was very difficult to keep the patient's oxygen saturation at appropriate level.  At 2 L nasal oxygen, oxygen saturation was 100% and at 1L, oxygen saturation dropped to 80%.  COVID-19 screen was still not back and patient could not be placed on BiPAP.  PCO2 level was too high to be calculated and it was felt patient would need to be intubated.  Patient is intubated and critical care consulted for admission.  Case is discussed with Dr. Genevive Bi who agrees to admit the patient.  Procedure Name: Intubation Date/Time: 05/14/2019 12:47 AM Performed by: Delora Fuel, MD Pre-anesthesia Checklist: Patient identified, Patient being monitored, Emergency Drugs available, Timeout performed and Suction available Oxygen Delivery Method: Non-rebreather mask Preoxygenation: Pre-oxygenation with 100% oxygen Induction Type: Rapid sequence Ventilation: Mask ventilation without difficulty Laryngoscope Size: Glidescope and 4 Grade View: Grade I Number of attempts: 1 Airway Equipment and Method: Rigid stylet and Video-laryngoscopy Placement Confirmation: ETT inserted through vocal cords under direct vision,  CO2 detector and Breath sounds checked- equal and bilateral Secured at: 23 cm Tube secured with: ETT holder Comments: Portable chest x-ray ordered.         Delora Fuel, MD [id number] [phone]

## 2019-05-14 NOTE — ED Notes (Signed)
Patient [place]  Jodi Underwood [phone]

## 2019-05-15 DIAGNOSIS — N184 Chronic kidney disease, stage 4 (severe): Secondary | ICD-10-CM

## 2019-05-15 DIAGNOSIS — G9340 Encephalopathy, unspecified: Secondary | ICD-10-CM

## 2019-05-15 DIAGNOSIS — J9601 Acute respiratory failure with hypoxia: Secondary | ICD-10-CM | POA: Diagnosis not present

## 2019-05-15 DIAGNOSIS — J9602 Acute respiratory failure with hypercapnia: Secondary | ICD-10-CM | POA: Diagnosis not present

## 2019-05-15 LAB — BASIC METABOLIC PANEL
Anion gap: 15 (ref 5–15)
BUN: 82 mg/dL — ABNORMAL HIGH (ref 8–23)
CO2: 29 mmol/L (ref 22–32)
Calcium: 8.7 mg/dL — ABNORMAL LOW (ref 8.9–10.3)
Chloride: 98 mmol/L (ref 98–111)
Creatinine, Ser: 3.62 mg/dL — ABNORMAL HIGH (ref 0.44–1.00)
GFR calc Af Amer: 14 mL/min — ABNORMAL LOW (ref >60)
GFR calc non Af Amer: 12 mL/min — ABNORMAL LOW (ref >60)
Glucose, Bld: 163 mg/dL — ABNORMAL HIGH (ref 70–99)
Potassium: 4.7 mmol/L (ref 3.5–5.1)
Sodium: 142 mmol/L (ref 135–145)

## 2019-05-15 LAB — GLUCOSE, CAPILLARY
Glucose-Capillary: 132 mg/dL — ABNORMAL HIGH (ref 70–99)
Glucose-Capillary: 155 mg/dL — ABNORMAL HIGH (ref 70–99)
Glucose-Capillary: 157 mg/dL — ABNORMAL HIGH (ref 70–99)
Glucose-Capillary: 161 mg/dL — ABNORMAL HIGH (ref 70–99)
Glucose-Capillary: 163 mg/dL — ABNORMAL HIGH (ref 70–99)
Glucose-Capillary: 167 mg/dL — ABNORMAL HIGH (ref 70–99)
Glucose-Capillary: 179 mg/dL — ABNORMAL HIGH (ref 70–99)

## 2019-05-15 LAB — PROCALCITONIN: Procalcitonin: 0.12 ng/mL

## 2019-05-15 MED ORDER — QUETIAPINE FUMARATE 50 MG PO TABS
100.0000 mg | ORAL_TABLET | Freq: Two times a day (BID) | ORAL | Status: DC
  Administered 2019-05-15 – 2019-05-17 (×6): 100 mg via ORAL
  Filled 2019-05-15 (×6): qty 2

## 2019-05-15 NOTE — Consult Note (Signed)
[facility] Nurse wound consult note Remote consult completed with aid of bedside RN.  Reason for Consult: Deep tissue injury to sacrum with moisture associated skin damage to bilateral gluteal folds.  Peeling epithelium reveals pink moist tissue. Recent loose stools.   Wound type:  Moisture and pressure Pressure Injury POA: Yes Measurement: bedside RN to obtain measurements for flow sheet.  Wound bed: pink and moist in areas where epithelium is peeling.  Drainage (amount, consistency, odor) minimal serosanguinous  Periwound:Maroon nonblanchable discoloration with peeling epithelium Dressing procedure/placement/frequency: Cleanse sacral and buttocks wound with NS and pat dry.  Apply calcium alginate to wound bed, cover with silicone foam.  Turn and reposition every two hours.  Keep skin clean and dry.  Will not follow at this time.  Please re-consult if needed.  Domenic Moras MSN, RN, FNP-BC CWON Wound, Ostomy, Continence Nurse Pager [phone]

## 2019-05-15 NOTE — Progress Notes (Signed)
NAME:  Jodi Underwood, MRN:  [id number], DOB:  [birth year], LOS: 1 ADMISSION DATE:  05/13/2019, CONSULTATION DATE:  05/15/19 REFERRING MD: Dr. Roxanne Mins , CHIEF COMPLAINT:  Hypercarbic Respiratory Failure   Brief History   73 year old female with history significant for obesity, OSA, CKD who was brought in by family for 1 week of dyspnea, edema, cough and chest pressure.  ABG in the ED showed respiratory acidosis with CO2 >97 she was intubated and PCCM consulted for admission.  Past Medical History   Past Medical History:  Diagnosis Date  . Anemia   . Arthritis   . Asthma   . Atrial fibrillation ([place])   . CKD (chronic kidney disease)   . Degenerative disc disease   . Diabetes mellitus   . Diabetic neuropathy ([place])   . Edema extremities   . Hypertension   . Lymphedema   . Morbid obesity ([place])   . OSA (obstructive sleep apnea)   . Pneumonia   . Urinary incontinence      Significant Hospital Events   12/28 Admit to PCCM  Consults:    Procedures:  ETT 10/28 >>  Significant Diagnostic Tests:  10/28 CT head>>no acute intracranial findings 10/28 echo EF 60 to 123456, grade 2 diastolic dysfunction, normal RV  Micro Data:  10/28 Sars-CoV-2>>neg 10/28 resp >> 10/28 RVP >> neg [facility] 10/28 >>   Antimicrobials:  ceftx 10/28 >> azithro 10/28 >>  Interim history/subjective:  Critically ill, intubated Sedated on Precedex and fentanyl, heart rate 50s Becomes agitated when fentanyl lower   Objective   Blood pressure (!) 86/56, pulse (!) 51, temperature 97.6 F (36.4 C), temperature source Oral, resp. rate (!) 24, height 5\' 7"  (1.702 m), weight (!) 169.2 kg, SpO2 98 %.    Vent Mode: PRVC FiO2 (%):  [40 %] 40 % Set Rate:  [24 bmp] 24 bmp Vt Set:  [490 mL] 490 mL PEEP:  [5 cmH20] 5 cmH20 Plateau Pressure:  [26 cmH20-30 cmH20] 26 cmH20   Intake/Output Summary (Last 24 hours) at 05/15/2019 1128 Last data filed at 05/15/2019 0800 Gross per 24 hour  Intake 2178.71 ml   Output 1635 ml  Net 543.71 ml   Filed Weights   05/14/19 0048 05/14/19 0252 05/15/19 0300  Weight: (!) 181.4 kg (!) 167.8 kg (!) 169.2 kg    General:  Morbidly obese F, intubated and sedated HEENT: MM pink/moist, ETT tube in place, no pallor, no icterus Neuro: RASS +2 to -2, intermittently follows commands, grossly nonfocal CV: s1s2 rrr, no m/r/g PULM:  rhonchi breath sounds bilaterally, no wheezing GI: soft, bsx4 active  Extremities: warm/dry, 3+ edema  Skin: no rashes or lesions  Chest x-ray 10/29 personally reviewed, bilateral airspace disease left more than right, mildly improved, volume loss on the right  Labs show increase in creatinine to 3.6, decreasing potassium to 4.7, low procalcitonin  Resolved Hospital Problem list     Assessment & Plan:  Acute hypercarbic respiratory failure -Suspect this is secondary to volume overload with obesity hypoventilation and noncompliant CPAP use , bicarbonate is high suggesting Ohs  - dd includes aspiration vs CAP , low procalcitonin P: -Start spontaneous breathing trials, likely will need better control of agitation before extubation -Empiric ceftriaxone and azithromycin while awaiting respiratory culture    Chest pressure, history of hypertension, presumed HFpEF, volume overload, history of atrial fibrillation, no evidence of acute coronary syndrome -High-sensitivity troponin 40, no changes on EKG suggestive of ACS -Last echo in 2018 with EF 50%  Hypokinesis  of the anteroseptal, inferior, and inferoseptal myocardium - no WMA on echo P:  -Bradycardic on Precedex- hold home amiodarone and metoprolol -Pt was anti-coagulated Coumadin, stopped last year per daughter  Type 2 diabetes -Pt is "diet-controlled" per daughter P: -Sliding scale insulin -Check A1c  Hypothyroidism -Continue Synthroid   Stage III CKD -Creatinine 3.0, which appears baseline Hyperkalemia-resolved P: -Avoid nephrotoxic medications --ct  Bumex - home  med, apparently furosemide is an allergy  Acute encephalopathy -likely metabolic/delirium Generalized Deconditioning -per daughter pt lives with family and is mentally very sharp, but is afraid of falling so rarely gets out of the chair, they have PT coming once per week -Continue Precedex although dose limited by bradycardia -Continue fentanyl drip, goal RASS 0 to -1 -Add Seroquel 100 twice daily and titrate to effect, avoiding benzodiazepines due to decreased clearance -PT consult once  extubated   Best practice:  Diet: N.p.o. Pain/Anxiety/Delirium protocol (if indicated): Fentanyl/precedex gtt VAP protocol (if indicated): Yes  DVT prophylaxis: Heparin and SCD GI prophylaxis: Protonix Glucose control: Sliding scale insulin, home Lantus Mobility: Bedrest Code Status: Full Family Communication: daughter Gilmore Laroche  Disposition: ICU   The patient is critically ill with multiple organ systems failure and requires high complexity decision making for assessment and support, frequent evaluation and titration of therapies, application of advanced monitoring technologies and extensive interpretation of multiple databases. Critical Care Time devoted to patient care services described in this note independent of APP/resident  time is 35 minutes.    Kara Mead MD. [place]. [facility]  If no response to pager , please call [phone]   05/15/2019

## 2019-05-15 NOTE — Progress Notes (Signed)
[facility] Progress Note Patient Name: Jodi Underwood DOB: January 20, 1946 MRN: [id number]   Date of Service  05/15/2019  HPI/Events of Note  Notified of urinary retention.   [place] scan 487ml.  Pt required straight catheterization the day prior also for urinary retention which drained about 1263ml of urine.  eICU Interventions  Insert foley cath.     Intervention Category Minor Interventions: Other:  Elsie Lincoln 05/15/2019, 4:56 AM

## 2019-05-16 ENCOUNTER — Other Ambulatory Visit: Payer: Self-pay

## 2019-05-16 ENCOUNTER — Inpatient Hospital Stay (HOSPITAL_COMMUNITY): Payer: Medicare Other

## 2019-05-16 DIAGNOSIS — I5033 Acute on chronic diastolic (congestive) heart failure: Secondary | ICD-10-CM

## 2019-05-16 DIAGNOSIS — R410 Disorientation, unspecified: Secondary | ICD-10-CM

## 2019-05-16 DIAGNOSIS — N179 Acute kidney failure, unspecified: Secondary | ICD-10-CM | POA: Diagnosis not present

## 2019-05-16 DIAGNOSIS — Z9911 Dependence on respirator [ventilator] status: Secondary | ICD-10-CM

## 2019-05-16 DIAGNOSIS — J9601 Acute respiratory failure with hypoxia: Secondary | ICD-10-CM | POA: Diagnosis not present

## 2019-05-16 LAB — POCT I-STAT 7, (LYTES, BLD GAS, ICA,H+H)
Acid-Base Excess: 7 mmol/L — ABNORMAL HIGH (ref 0.0–2.0)
Bicarbonate: 33 mmol/L — ABNORMAL HIGH (ref 20.0–28.0)
Calcium, Ion: 1.18 mmol/L (ref 1.15–1.40)
HCT: 22 % — ABNORMAL LOW (ref 36.0–46.0)
Hemoglobin: 7.5 g/dL — ABNORMAL LOW (ref 12.0–15.0)
O2 Saturation: 95 %
Potassium: 4.1 mmol/L (ref 3.5–5.1)
Sodium: 142 mmol/L (ref 135–145)
TCO2: 35 mmol/L — ABNORMAL HIGH (ref 22–32)
pCO2 arterial: 57.1 mmHg — ABNORMAL HIGH (ref 32.0–48.0)
pH, Arterial: 7.37 (ref 7.350–7.450)
pO2, Arterial: 81 mmHg — ABNORMAL LOW (ref 83.0–108.0)

## 2019-05-16 LAB — BASIC METABOLIC PANEL
Anion gap: 12 (ref 5–15)
BUN: 87 mg/dL — ABNORMAL HIGH (ref 8–23)
CO2: 32 mmol/L (ref 22–32)
Calcium: 8.8 mg/dL — ABNORMAL LOW (ref 8.9–10.3)
Chloride: 100 mmol/L (ref 98–111)
Creatinine, Ser: 3.77 mg/dL — ABNORMAL HIGH (ref 0.44–1.00)
GFR calc Af Amer: 13 mL/min — ABNORMAL LOW (ref >60)
GFR calc non Af Amer: 11 mL/min — ABNORMAL LOW (ref >60)
Glucose, Bld: 175 mg/dL — ABNORMAL HIGH (ref 70–99)
Potassium: 3.9 mmol/L (ref 3.5–5.1)
Sodium: 144 mmol/L (ref 135–145)

## 2019-05-16 LAB — CBC
HCT: 24.8 % — ABNORMAL LOW (ref 36.0–46.0)
Hemoglobin: 7.2 g/dL — ABNORMAL LOW (ref 12.0–15.0)
MCH: 26.7 pg (ref 26.0–34.0)
MCHC: 29 g/dL — ABNORMAL LOW (ref 30.0–36.0)
MCV: 91.9 fL (ref 80.0–100.0)
Platelets: 220 10*3/uL (ref 150–400)
RBC: 2.7 MIL/uL — ABNORMAL LOW (ref 3.87–5.11)
RDW: 13.2 % (ref 11.5–15.5)
WBC: 6.9 10*3/uL (ref 4.0–10.5)
nRBC: 0 % (ref 0.0–0.2)

## 2019-05-16 LAB — CULTURE, RESPIRATORY W GRAM STAIN: Culture: NORMAL

## 2019-05-16 LAB — GLUCOSE, CAPILLARY
Glucose-Capillary: 149 mg/dL — ABNORMAL HIGH (ref 70–99)
Glucose-Capillary: 199 mg/dL — ABNORMAL HIGH (ref 70–99)
Glucose-Capillary: 207 mg/dL — ABNORMAL HIGH (ref 70–99)
Glucose-Capillary: 100 mg/dL — ABNORMAL HIGH (ref 70–99)
Glucose-Capillary: 200 mg/dL — ABNORMAL HIGH (ref 70–99)
Glucose-Capillary: 200 mg/dL — ABNORMAL HIGH (ref 70–99)

## 2019-05-16 LAB — MAGNESIUM: Magnesium: 2.2 mg/dL (ref 1.7–2.4)

## 2019-05-16 LAB — PHOSPHORUS: Phosphorus: 1.9 mg/dL — ABNORMAL LOW (ref 2.5–4.6)

## 2019-05-16 LAB — PROCALCITONIN: Procalcitonin: 0.26 ng/mL

## 2019-05-16 LAB — BRAIN NATRIURETIC PEPTIDE: B Natriuretic Peptide: 1581 pg/mL — ABNORMAL HIGH (ref 0.0–100.0)

## 2019-05-16 MED ORDER — FUROSEMIDE 10 MG/ML IJ SOLN
80.0000 mg | Freq: Once | INTRAMUSCULAR | Status: AC
  Administered 2019-05-16: 80 mg via INTRAVENOUS
  Filled 2019-05-16: qty 8

## 2019-05-16 MED ORDER — INSULIN GLARGINE 100 UNIT/ML ~~LOC~~ SOLN
10.0000 [IU] | Freq: Every day | SUBCUTANEOUS | Status: DC
  Administered 2019-05-16: 10 [IU] via SUBCUTANEOUS
  Filled 2019-05-16 (×2): qty 0.1

## 2019-05-16 MED ORDER — PANTOPRAZOLE SODIUM 40 MG IV SOLR
40.0000 mg | INTRAVENOUS | Status: DC
  Administered 2019-05-16: 40 mg via INTRAVENOUS
  Filled 2019-05-16: qty 40

## 2019-05-16 MED ORDER — FUROSEMIDE 10 MG/ML IJ SOLN
160.0000 mg | Freq: Once | INTRAVENOUS | Status: AC
  Administered 2019-05-16: 160 mg via INTRAVENOUS
  Filled 2019-05-16: qty 10

## 2019-05-16 NOTE — Progress Notes (Signed)
NAME:  Fiona Legg, MRN:  [id number], DOB:  11-13-1945, LOS: 2 ADMISSION DATE:  05/13/2019, CONSULTATION DATE:  05/16/19 REFERRING MD: Dr. Roxanne Mins , CHIEF COMPLAINT:  Hypercarbic Respiratory Failure   Brief History   73 year old female with history significant for obesity, OSA, CKD who was brought in by family for 1 week of dyspnea, edema, cough and chest pressure.  ABG in the ED showed respiratory acidosis with CO2 >97 she was intubated and PCCM consulted for admission.  Past Medical History   Past Medical History:  Diagnosis Date  . Anemia   . Arthritis   . Asthma   . Atrial fibrillation ([place])   . CKD (chronic kidney disease)   . Degenerative disc disease   . Diabetes mellitus   . Diabetic neuropathy ([place])   . Edema extremities   . Hypertension   . Lymphedema   . Morbid obesity ([place])   . OSA (obstructive sleep apnea)   . Pneumonia   . Urinary incontinence      Significant Hospital Events   12/28 Admit to PCCM  Consults:    Procedures:  ETT 10/28 >>  Significant Diagnostic Tests:  10/28 CT head>>no acute intracranial findings 10/28 echo LVEF 60 to 123456, grade 2 diastolic dysfunction, normal RV, normal atria, normal valves  Micro Data:  10/28 Sars-CoV-2>>neg 10/28 resp >> 10/28 RVP >> neg [facility] 10/28 >>   Antimicrobials:  ceftx 10/28 >> azithro 10/28 >>  Interim history/subjective:  Ongoing agitation with weaning of sedation.  Failed SBT with respiratory due to low volumes.  Objective   Blood pressure (!) 89/68, pulse 64, temperature (!) 97 F (36.1 C), temperature source Axillary, resp. rate 18, height 5\' 7"  (1.702 m), weight (!) 166.2 kg, SpO2 97 %.    Vent Mode: PRVC FiO2 (%):  [35 %-40 %] 35 % Set Rate:  [14 bmp-24 bmp] 14 bmp Vt Set:  [490 mL] 490 mL PEEP:  [5 cmH20] 5 cmH20 Plateau Pressure:  [24 [id number] cmH20] 24 cmH20   Intake/Output Summary (Last 24 hours) at 05/16/2019 1421 Last data filed at 05/16/2019 1400 Gross per 24 hour  Intake  3421.62 ml  Output 1355 ml  Net 2066.62 ml   Filed Weights   05/14/19 0252 05/15/19 0300 05/16/19 0333  Weight: (!) 167.8 kg (!) 169.2 kg (!) 166.2 kg    General: Obese elderly woman lying in bed no acute distress, intubated and sedated HEENT: [place]/AT, eyes anicteric Neuro: RASS -5, minimal withdrawal to painful stimulus stimulation CV: Regular rate and rhythm, no murmurs PULM: No wheezing, mild rhonchi bilaterally, minimal secretions from ET tube.  Breathing spontaneously above the vent GI: Obese, soft, nontender Extremities: Bilateral lower extremity lymphedema, no cyanosis or clubbing Skin: No rashes or wounds  BNP 250--> 1581 ABG    Component Value Date/Time   PHART 7.370 05/16/2019 1037   PCO2ART 57.1 (H) 05/16/2019 1037   PO2ART 81.0 (L) 05/16/2019 1037   HCO3 33.0 (H) 05/16/2019 1037   TCO2 35 (H) 05/16/2019 1037   ACIDBASEDEF 9.1 (H) 12/24/2017 0955   O2SAT 95.0 05/16/2019 1037  on AC-V 14/490/5/35%  Resolved Hospital Problem list     Assessment & Plan:  Acute hypoxic and hypercarbic respiratory failure requiring mechanical ventilation -Suspect this is secondary to volume overload with obesity hypoventilation and noncompliant CPAP use , bicarbonate is high suggesting OHS  -Unlikely pneumonia P: -Continue low tidal volume ventilation, 8 cc/kg ideal body weight, goal plateau less than 30 driving pressure less than 15. -Daily SAT  and SBT as tolerated-failed today due to hypoventilation.  Working to optimize sedation. Goal RASS 0 to -1. -More aggressive diuresis - this continue antibiotics  Acute on chronic heart failure-HFpEF hypervolemia.  History of atrial fibrillation, no evidence of acute coronary syndrome. -High-sensitivity troponin 40, no changes on EKG suggestive of ACS -Last echo in 2018 with EF 50%  Hypokinesis of the anteroseptal, inferior, and inferoseptal myocardium - no WMA on echo P: -Diuresis -Repeat BNP on 11/1 -Goal net -1 to 2 L/day  Type 2  diabetes, A1c 6.7- diet-controlled per daughter.  Uncontrolled hyperglycemia. P: -Adding Lantus 10 units daily -Accu-Cheks every 4 hours with sliding scale insulin as needed -Goal BG 140-180 while admitted to the ICU  Hypothyroidism -Continue Synthroid  AKI on Stage III CKD-suspect cardiorenal syndrome -Creatinine 3.0, which appears baseline Hyperkalemia-resolved P: -Measure I/O record -Diuresis, increasing Lasix dose -goal net negative 1-2L per day  Acute encephalopathy -suspect metabolic/delirium Generalized Deconditioning- per daughter pt lives with family and is mentally very sharp, but is afraid of falling so rarely gets out of the chair, they have PT coming once per week -Continue sedation with fentanyl and Precedex; wean to goal RASS 0 to -1.  Please avoid benzodiazepines. -PT and OT once appropriate -Continue Seroquel twice daily; may need to decrease dose if QTc remains prolonged  Prolonged QTc due to meds -stopping azithromycin today -will keep PPI rather than H2 blocker -recheck tomorrow; may need to decrease seroquel  Best practice:  Diet: TF Pain/Anxiety/Delirium protocol (if indicated): Fentanyl/precedex gtt VAP protocol (if indicated): Yes  DVT prophylaxis: Heparin and SCD GI prophylaxis: Protonix Glucose control: Sliding scale insulin, Lantus Mobility: Bedrest Code Status: Full Family Communication: daughter Gilmore [place]  Disposition: ICU  This patient is critically ill with multiple organ system failure which requires frequent high complexity decision making, assessment, support, evaluation, and titration of therapies. This was completed through the application of advanced monitoring technologies and extensive interpretation of multiple databases. During this encounter critical care time was devoted to patient care services described in this note for 45 minutes.

## 2019-05-17 ENCOUNTER — Encounter (HOSPITAL_COMMUNITY): Payer: Self-pay | Admitting: Nephrology

## 2019-05-17 ENCOUNTER — Inpatient Hospital Stay (HOSPITAL_COMMUNITY): Payer: Medicare Other

## 2019-05-17 DIAGNOSIS — R34 Anuria and oliguria: Secondary | ICD-10-CM | POA: Diagnosis not present

## 2019-05-17 DIAGNOSIS — L89159 Pressure ulcer of sacral region, unspecified stage: Secondary | ICD-10-CM | POA: Diagnosis present

## 2019-05-17 DIAGNOSIS — N179 Acute kidney failure, unspecified: Secondary | ICD-10-CM | POA: Diagnosis not present

## 2019-05-17 DIAGNOSIS — J9601 Acute respiratory failure with hypoxia: Secondary | ICD-10-CM | POA: Diagnosis not present

## 2019-05-17 DIAGNOSIS — J9602 Acute respiratory failure with hypercapnia: Secondary | ICD-10-CM | POA: Diagnosis not present

## 2019-05-17 LAB — COMPREHENSIVE METABOLIC PANEL
ALT: 30 U/L (ref 0–44)
AST: 66 U/L — ABNORMAL HIGH (ref 15–41)
Albumin: 2.4 g/dL — ABNORMAL LOW (ref 3.5–5.0)
Alkaline Phosphatase: 109 U/L (ref 38–126)
Anion gap: 14 (ref 5–15)
BUN: 113 mg/dL — ABNORMAL HIGH (ref 8–23)
CO2: 30 mmol/L (ref 22–32)
Calcium: 8.4 mg/dL — ABNORMAL LOW (ref 8.9–10.3)
Chloride: 96 mmol/L — ABNORMAL LOW (ref 98–111)
Creatinine, Ser: 4.47 mg/dL — ABNORMAL HIGH (ref 0.44–1.00)
GFR calc Af Amer: 11 mL/min — ABNORMAL LOW (ref >60)
GFR calc non Af Amer: 9 mL/min — ABNORMAL LOW (ref >60)
Glucose, Bld: 184 mg/dL — ABNORMAL HIGH (ref 70–99)
Potassium: 4.2 mmol/L (ref 3.5–5.1)
Sodium: 140 mmol/L (ref 135–145)
Total Bilirubin: 0.4 mg/dL (ref 0.3–1.2)
Total Protein: 6.2 g/dL — ABNORMAL LOW (ref 6.5–8.1)

## 2019-05-17 LAB — GLUCOSE, CAPILLARY
Glucose-Capillary: 155 mg/dL — ABNORMAL HIGH (ref 70–99)
Glucose-Capillary: 159 mg/dL — ABNORMAL HIGH (ref 70–99)
Glucose-Capillary: 162 mg/dL — ABNORMAL HIGH (ref 70–99)
Glucose-Capillary: 170 mg/dL — ABNORMAL HIGH (ref 70–99)
Glucose-Capillary: 171 mg/dL — ABNORMAL HIGH (ref 70–99)
Glucose-Capillary: 198 mg/dL — ABNORMAL HIGH (ref 70–99)

## 2019-05-17 LAB — URINALYSIS, ROUTINE W REFLEX MICROSCOPIC
Bilirubin Urine: NEGATIVE
Glucose, UA: NEGATIVE mg/dL
Ketones, ur: NEGATIVE mg/dL
Nitrite: NEGATIVE
Protein, ur: 100 mg/dL — AB
Specific Gravity, Urine: 1.017 (ref 1.005–1.030)
WBC, UA: >50 WBC/hpf — ABNORMAL HIGH (ref 0–5)
pH: 5 (ref 5.0–8.0)

## 2019-05-17 LAB — CREATININE, URINE, RANDOM: Creatinine, Urine: 130.01 mg/dL

## 2019-05-17 LAB — CBC
HCT: 24.5 % — ABNORMAL LOW (ref 36.0–46.0)
Hemoglobin: 7.1 g/dL — ABNORMAL LOW (ref 12.0–15.0)
MCH: 27.6 pg (ref 26.0–34.0)
MCHC: 29 g/dL — ABNORMAL LOW (ref 30.0–36.0)
MCV: 95.3 fL (ref 80.0–100.0)
Platelets: 212 10*3/uL (ref 150–400)
RBC: 2.57 MIL/uL — ABNORMAL LOW (ref 3.87–5.11)
RDW: 14 % (ref 11.5–15.5)
WBC: 6.3 10*3/uL (ref 4.0–10.5)
nRBC: 0 % (ref 0.0–0.2)

## 2019-05-17 LAB — SODIUM, URINE, RANDOM: Sodium, Ur: 48 mmol/L

## 2019-05-17 LAB — PHOSPHORUS: Phosphorus: 3.4 mg/dL (ref 2.5–4.6)

## 2019-05-17 MED ORDER — SODIUM CHLORIDE 0.9 % IV SOLN
INTRAVENOUS | Status: DC
  Administered 2019-05-17 – 2019-05-19 (×3): via INTRAVENOUS

## 2019-05-17 MED ORDER — LEVOTHYROXINE SODIUM 75 MCG PO TABS
75.0000 ug | ORAL_TABLET | Freq: Every day | ORAL | Status: DC
  Administered 2019-05-18 – 2019-05-31 (×13): 75 ug
  Filled 2019-05-17 (×14): qty 1

## 2019-05-17 MED ORDER — INSULIN GLARGINE 100 UNIT/ML ~~LOC~~ SOLN
13.0000 [IU] | Freq: Every day | SUBCUTANEOUS | Status: DC
  Administered 2019-05-18 – 2019-05-19 (×2): 13 [IU] via SUBCUTANEOUS
  Filled 2019-05-17 (×3): qty 0.13

## 2019-05-17 MED ORDER — PANTOPRAZOLE SODIUM 40 MG PO PACK
40.0000 mg | PACK | Freq: Every day | ORAL | Status: DC
  Administered 2019-05-17 – 2019-05-18 (×2): 40 mg
  Filled 2019-05-17 (×2): qty 20

## 2019-05-17 MED ORDER — BUMETANIDE 2 MG PO TABS
4.0000 mg | ORAL_TABLET | Freq: Every day | ORAL | Status: DC
  Administered 2019-05-17: 11:00:00 4 mg
  Filled 2019-05-17: qty 2

## 2019-05-17 NOTE — Consult Note (Addendum)
Renal Service Consult Note [facility]  Jodi Underwood 05/17/2019 Sol Blazing Requesting Physician:  Dr Kennon Rounds, Lenna Sciara.   Reason for Consult:  AKI HPI: The patient is a 73 y.o. year-old w/ hx of morbid obesity, lymphedema, HTN, DM2, diab neuropathy, DDD, atrial fibrillation, asthma, anemia, DJD, OSA and CKD IV admitted 10/28 for rsp distress and severe hypercarbia. CXR also w/ infiltrates pna vs CHF, pt was placed on IV abx and diuretics.  Creat went up from 3.1 on admission to 3.7 yest and 4.5 today.  Asked to see for renal failure.    Patient seen in 2019 by renal for CKD as well.  Seen in ICU, pt intubated and sedated, no hx given.  Hx obtained from the chart.       Date       Diuretics    I/O     BUN/Cr  10/28   8m bumex po + 87mlasix IV 2.2 L in/ 1.6 L out 64/ 3.09   10/29   2 mg bumex po   3.0 L in/ 1.2 L out   10/30   160 mg lasix IV total    2.4 L in/ 610 cc out 87/ 3.77   10/31  66m52mumex po    919 cc/ 140 cc out 113/ 4.47     Date   Creat   eGFR  04/2019, here  3.02 >> 4.47    01/2018  3.10- 3.80  13- 16 ml/min   12/2017  4.3 >> 3.1   2018    1.01- 1.88   2017   1.0- 1.2   2016   1.60- 2.49   2014   1.20  2012   1.00   ROS - n/a   Past Medical History  Past Medical History:  Diagnosis Date  . Anemia   . Arthritis   . Asthma   . Atrial fibrillation ([place] . CKD (chronic kidney disease)   . Degenerative disc disease   . Diabetes mellitus   . Diabetic neuropathy ([facility] . Edema extremities   . Hypertension   . Lymphedema   . [place]bid [place] . OSA (obstructive sleep apnea)   . Pneumonia   . Urinary incontinence    Past Surgical History  Past Surgical History:  Procedure Laterality Date  . ABDOMINAL HYSTERECTOMY     COMPLETE   Family History  Family History  Adopted: Yes  Family history unknown: Yes   Social History  reports that she has never smoked. She has never used smokeless tobacco. She reports current  alcohol use. She reports that she does not use drugs. Allergies  Allergies  Allergen Reactions  . Ciprofloxacin Other (See Comments)    Hallucinations  . Warfarin And Related Other (See Comments)    "Did not work well with her," per daughter   Home medications Prior to Admission medications   Medication Sig Start Date End Date Taking? Authorizing Provider  albuterol (VENTOLIN HFA) 108 (90 Base) MCG/ACT inhaler Inhale 2 puffs into the lungs 2 (two) times daily as needed for wheezing or shortness of breath.  12/02/18  Yes [provider]  amiodarone (PACERONE) 200 MG tablet Take 200 mg by mouth daily after breakfast.  08/25/17  Yes [provider]  bumetanide (BUMEX) 2 MG tablet Take 2 mg by mouth daily after breakfast.  11/09/17  Yes [provider]  cyclobenzaprine (FLEXERIL) 10 MG tablet Take 10 mg by mouth every 6 (  six) hours as needed for muscle spasms.   Yes [provider]  diphenhydramine-acetaminophen (TYLENOL PM) 25-500 MG TABS tablet Take 1 tablet by mouth at bedtime as needed (for sleep).   Yes [provider]  Hydrocortisone (GERHARDT'S BUTT CREAM) CREA Apply 1 application topically 2 (two) times daily. 02/05/18  Yes Georgette Shell, MD  Insulin NPH, Human,, Isophane, (NOVOLIN N FLEXPEN RELION) 100 UNIT/ML Kiwkpen Inject 50 Units into the skin 2 (two) times daily.    Yes [provider]  levothyroxine (EUTHYROX) 25 MCG tablet Take 12.5 mcg by mouth daily before breakfast.    Yes [provider]  levothyroxine (EUTHYROX) 50 MCG tablet Take 50 mcg by mouth daily before breakfast.   Yes [provider]  naproxen sodium (ALEVE) 220 MG tablet Take 220-440 mg by mouth 2 (two) times daily as needed (for pain or headaches).   Yes [provider]  potassium chloride (K-DUR) 10 MEQ tablet Take 10 mEq by mouth 3 (three) times daily with meals.  01/25/18  Yes [provider]  acetaminophen (TYLENOL) 325 MG  tablet Take 325-650 mg by mouth every 6 (six) hours as needed (For pain.).     [provider]  cholestyramine light (PREVALITE) 4 g packet Take 1 packet (4 g total) by mouth 3 (three) times daily. 02/05/18   Georgette Shell, MD  fluticasone [facility]) 50 MCG/ACT nasal spray Place 2 sprays into both nostrils daily. Patient not taking: Reported on 05/15/2019 10/20/16   Patrecia Pour, Christean Grief, MD  guaiFENesin (MUCINEX) 600 MG 12 hr tablet Take 600 mg by mouth 2 (two) times daily as needed for to loosen phlegm.     [provider]  metoprolol tartrate (LOPRESSOR) 25 MG tablet Take 0.5 tablets (12.5 mg total) by mouth 2 (two) times daily. 12/28/17   Alma Friendly, MD  montelukast (SINGULAIR) 10 MG tablet Take 10 mg by mouth daily. 08/24/17   [provider]  nystatin cream (MYCOSTATIN) Apply 1 application topically 2 (two) times daily.    [provider]  promethazine (PHENERGAN) 25 MG tablet Take 25 mg by mouth every 6 (six) hours as needed for nausea or vomiting.    [provider]  saccharomyces boulardii (FLORASTOR) 250 MG capsule Take 1 capsule (250 mg total) by mouth 3 (three) times daily. 02/05/18   Georgette Shell, MD   Liver Function Tests Recent Labs  Lab 05/13/19 2346 05/17/19 0820  AST 15 66*  ALT 12 30  ALKPHOS 71 109  BILITOT 0.5 0.4  PROT 7.6 6.2*  ALBUMIN 3.2* 2.4*   Recent Labs  Lab 05/13/19 2346  LIPASE 34   CBC Recent Labs  Lab 05/13/19 2346  05/14/19 0319 05/16/19 0226 05/16/19 1037 05/17/19 0820  WBC 12.3*  --  13.0* 6.9  --  6.3  NEUTROABS 9.8*  --   --   --   --   --   HGB 8.4*   < > 8.1* 7.2* 7.5* 7.1*  HCT 32.0*   < > 30.0* 24.8* 22.0* 24.5*  MCV 102.2*  --  98.7 91.9  --  95.3  PLT 276  --  232 220  --  212   < > = values in this interval not displayed.   Basic Metabolic Panel Recent Labs  Lab 05/13/19 2346 05/14/19 0214 05/14/19 0319 05/14/19 1412 05/15/19 0818 05/16/19 0226 05/16/19 1037  05/17/19 0820  NA 142 141 143  --  142 144 142 140  K  5.6* 5.5* 5.6* 5.3* 4.7 3.9 4.1 4.2  CL 97*  --  97*  --  98 100  --  96*  CO2 34*  --  35*  --  29 32  --  30  GLUCOSE 117*  --  119*  --  163* 175*  --  184*  BUN 61*  --  64*  --  82* 87*  --  113*  CREATININE 3.00*  --  3.09*  --  3.62* 3.77*  --  4.47*  CALCIUM 9.3  --  9.4  --  8.7* 8.8*  --  8.4*  PHOS  --   --   --   --   --  1.9*  --  3.4   Iron/TIBC/Ferritin/ %Sat    Component Value Date/Time   IRON 65 02/05/2018 0603   TIBC 108 (L) 02/05/2018 0603   FERRITIN 143 02/05/2018 0603   IRONPCTSAT 60 (H) 02/05/2018 0603    Vitals:   05/17/19 1530 05/17/19 1630 05/17/19 1730 05/17/19 1800  BP: 95/61 117/65 92/72 128/86  Pulse: (!) 55 (!) 56 60 86  Resp: 19 20 18 15   Temp:      TempSrc:      SpO2: 98% 100% 100% 100%  Weight:      Height:        Exam Gen on vent, lying flat, eyes closed , doesn't follow commands No rash, cyanosis or gangrene Sclera anicteric, throat w ETT  No jvd or bruits Chest clear ant and lat bilat RRR soft SEM rusb, no RG Abd soft ntnd no mass or ascites +bs markedly obese, no abd wall edema GU foley draining clear yellow-amber urine small amts MS no joint effusions or deformity Ext diffuse lymphedema bilat LE's but no sig pitting edema Neuro is lethargic, on the vent    Home meds:  - metoprolol 12.5 bid/ Kdur 10 tid/ bumetanide 29m qd  - amiodarone 200 am  - levothyroxine qam  - insulin NPH 50u bid  - naproxen 220-440 prn   - montelukast 10 qd  - prn's/ vitamins/ supplements    UA 10/31 > cloudy, 100 prot, >50 wbc, 11-20 rbc, few bact   UNa 48, UCreat 130,  FeNa = 1.16%   CXR initial films w/ focal and diffuse IS opacities, f/u xrays show resolution of IS opacities and persistent R basilar air space disease    Assessment/ Plan: 1. AKI on CKD IV - baseline creat 3.0- 3.5, creat up here w/ diuresis.  IS infiltrates ?CHF on initial films has resolved.  On exam does not have  much vol excess. Would favor gentle IVF's 75 cc/hr and hold all diuretics. Pt is not a dialysis candidate due to severe comorbidities and severe deconditioning. Would consider getting palliative care involved given advanced renal failure and hypercarbia resp failure.  2. Acute on chronic hypercarbic resp failure - w/ CHF vs pna vs both on admission. Xrays have improved, but still on vent.  3. Morbid obesity 4. HTN  5. IDDM 6. Hypothyroidism   RKelly Splinter MD 05/17/2019, 7:24 PM

## 2019-05-17 NOTE — Progress Notes (Signed)
NAME:  Jodi Underwood, MRN:  [id number], DOB:  Oct 31, 1945, LOS: 3 ADMISSION DATE:  05/13/2019, CONSULTATION DATE:  05/17/19 REFERRING MD: Dr. Roxanne Mins , CHIEF COMPLAINT:  Hypercarbic Respiratory Failure   Brief History   73 year old female with history significant for obesity, OSA, CKD who was brought in by family for 1 week of dyspnea, edema, cough and chest pressure.  ABG in the ED showed respiratory acidosis with CO2 >97 she was intubated and PCCM consulted for admission.  Past Medical History   Past Medical History:  Diagnosis Date  . Anemia   . Arthritis   . Asthma   . Atrial fibrillation ([place])   . CKD (chronic kidney disease)   . Degenerative disc disease   . Diabetes mellitus   . Diabetic neuropathy ([place])   . Edema extremities   . Hypertension   . Lymphedema   . Morbid obesity ([place])   . OSA (obstructive sleep apnea)   . Pneumonia   . Urinary incontinence      Significant Hospital Events   12/28 Admit to PCCM  Consults:  Nephro  Procedures:  ETT 10/28 >>  Significant Diagnostic Tests:  10/28 CT head>>no acute intracranial findings 10/28 echo LVEF 60 to 123456, grade 2 diastolic dysfunction, normal RV, normal atria, normal valves  Micro Data:  10/28 Sars-CoV-2>>neg 10/28 resp >> negative 10/28 RVP >> neg [facility] 10/27 >>   Antimicrobials:  ceftx 10/28 >>10/30 azithro 10/28 >> 10/30  Interim history/subjective:  Agitation, but not responding, tracking, or opening her eyes.  Objective   Blood pressure (!) 87/60, pulse (!) 57, temperature (!) 97.2 F (36.2 C), temperature source Axillary, resp. rate 20, height 5\' 7"  (1.702 m), weight (!) 171.8 kg, SpO2 (!) 86 %.    Vent Mode: PRVC FiO2 (%):  [35 %-50 %] 50 % Set Rate:  [14 bmp] 14 bmp Vt Set:  [490 mL] 490 mL PEEP:  [5 cmH20] 5 cmH20 Plateau Pressure:  [24 [id number] cmH20] 30 cmH20   Intake/Output Summary (Last 24 hours) at 05/17/2019 1015 Last data filed at 05/17/2019 0900 Gross per 24 hour  Intake  2366.55 ml  Output 400 ml  Net 1966.55 ml   Filed Weights   05/15/19 0300 05/16/19 0333 05/17/19 0254  Weight: (!) 169.2 kg (!) 166.2 kg (!) 171.8 kg    General: Obese elderly woman lying in bed in no acute distress, intubated, sedated, moving spontaneously HEENT: [place]/AT, eyes anicteric Neuro: RASS -5, but spontaneously moving frequently.  Resists having her eyes open, but not opening her eyes spontaneously, not tracking.  Not withdrawing from nail bed pressure.  Pupils equal and reactive CV: Regular rate and rhythm, no murmurs PULM: Distant breath sounds, dyssynchronous on the vent.  Wheezing.  1 thick mucous plug, otherwise minimal secretions from ET tube.  Plateau pressure 24. GI: BS, soft, nontender Extremities: Chronic bilateral lower extremity lymphedema, no clubbing or cyanosis Skin: No rashes, bruising, wounds.  BNP 250--> 1581   Resolved Hospital Problem list     Assessment & Plan:  Acute hypoxic and hypercarbic respiratory failure requiring mechanical ventilation.  Mucous plugging today. -Suspect this is secondary to volume overload with obesity hypoventilation and noncompliant CPAP use , bicarbonate is high suggesting OHS P: -Repeat CXR -Continue LTV V, 8 cc/kg ideal body weight, goal plateau less than 30 and driving pressure less than 15.   -Daily SAT and SBT as tolerated-failed today due to hypoxia and increasing oxygen requirement.  Ongoing delirium precludes extubation.  Acute on chronic  heart failure-HFpEF hypervolemia.  History of atrial fibrillation, no evidence of acute coronary syndrome. -Increasing BNP level -LVEF 60 to 65%, elevated LVEDP, diastolic dysfunction. P: -Continue diuresis, switching back to oral Bumex given poor response to high-dose IV Lasix. -Repeat BMP tomorrow -Consulting nephrology for assistance with volume management.  Type 2 diabetes, A1c 6.7- diet-controlled per daughter.  Uncontrolled hyperglycemia. P: -Increasing Lantus to 13 units  daily -Continue Accu-Cheks every 4 hours with sliding scale insulin as needed -Goal BG 140-180 while mated to the ICU  Hypothyroidism; TSH 9.2 at admission -Increase Synthroid to home dose of 75 mcg daily; likely needs higher home dose, but as her hypothyroidism is unlikely precipitating her acute condition, we will defer further increases until she improves.  AKI on Stage III CKD- suspect cardiorenal syndrome. Oliguric despite aggressive diuretic doses. Hyperkalemia-resolved P: -Measure I/O and record -Continue diuresis; switching back to Bumex due to poor response with Lasix -Nephrology consult -Rechecking UA, urine creatinine, sodium, urea -Renal ultrasound -Checking Phos level  Acute encephalopathy -suspect metabolic/delirium.  Azotemia is likely worsening this. Generalized Deconditioning- per daughter pt lives with family and is mentally very sharp, but is afraid of falling so rarely gets out of the chair, they have PT coming once per week -Minimize sedation as much as possible; goal RASS 0 to -1.  Continue fentanyl and Precedex -Continue Seroquel.  Repeat EKG pending. -PT and OT once appropriate -Delirium precautions  Prolonged QTc due to meds -will keep PPI rather than H2 blocker -Repeat EKG today; may need to adjust Seroquel dose  Best practice:  Diet: TF Pain/Anxiety/Delirium protocol (if indicated): Fentanyl/precedex gtt VAP protocol (if indicated): Yes  DVT prophylaxis: Heparin and SCD GI prophylaxis: Protonix Glucose control: Sliding scale insulin, Lantus Mobility: Bedrest Code Status: Full Family Communication: daughter  Updated daughter [name] at bedside today Disposition: ICU  This patient is critically ill with multiple organ system failure which requires frequent high complexity decision making, assessment, support, evaluation, and titration of therapies. This was completed through the application of advanced monitoring technologies and extensive  interpretation of multiple databases. During this encounter critical care time was devoted to patient care services described in this note for 55 minutes.  Julian Hy, DO 05/17/19 10:37 AM [facility]

## 2019-05-18 DIAGNOSIS — F05 Delirium due to known physiological condition: Secondary | ICD-10-CM

## 2019-05-18 DIAGNOSIS — Z7189 Other specified counseling: Secondary | ICD-10-CM

## 2019-05-18 DIAGNOSIS — E1129 Type 2 diabetes mellitus with other diabetic kidney complication: Secondary | ICD-10-CM

## 2019-05-18 DIAGNOSIS — L89159 Pressure ulcer of sacral region, unspecified stage: Secondary | ICD-10-CM

## 2019-05-18 LAB — BASIC METABOLIC PANEL
Anion gap: 13 (ref 5–15)
BUN: 120 mg/dL — ABNORMAL HIGH (ref 8–23)
CO2: 28 mmol/L (ref 22–32)
Calcium: 8.5 mg/dL — ABNORMAL LOW (ref 8.9–10.3)
Chloride: 100 mmol/L (ref 98–111)
Creatinine, Ser: 4.77 mg/dL — ABNORMAL HIGH (ref 0.44–1.00)
GFR calc Af Amer: 10 mL/min — ABNORMAL LOW (ref >60)
GFR calc non Af Amer: 8 mL/min — ABNORMAL LOW (ref >60)
Glucose, Bld: 173 mg/dL — ABNORMAL HIGH (ref 70–99)
Potassium: 4.4 mmol/L (ref 3.5–5.1)
Sodium: 141 mmol/L (ref 135–145)

## 2019-05-18 LAB — BRAIN NATRIURETIC PEPTIDE: B Natriuretic Peptide: 204 pg/mL — ABNORMAL HIGH (ref 0.0–100.0)

## 2019-05-18 LAB — CBC
HCT: 24.8 % — ABNORMAL LOW (ref 36.0–46.0)
Hemoglobin: 7 g/dL — ABNORMAL LOW (ref 12.0–15.0)
MCH: 26.8 pg (ref 26.0–34.0)
MCHC: 28.2 g/dL — ABNORMAL LOW (ref 30.0–36.0)
MCV: 95 fL (ref 80.0–100.0)
Platelets: 193 10*3/uL (ref 150–400)
RBC: 2.61 MIL/uL — ABNORMAL LOW (ref 3.87–5.11)
RDW: 14 % (ref 11.5–15.5)
WBC: 8.4 10*3/uL (ref 4.0–10.5)
nRBC: 0 % (ref 0.0–0.2)

## 2019-05-18 LAB — GLUCOSE, CAPILLARY
Glucose-Capillary: 153 mg/dL — ABNORMAL HIGH (ref 70–99)
Glucose-Capillary: 163 mg/dL — ABNORMAL HIGH (ref 70–99)
Glucose-Capillary: 179 mg/dL — ABNORMAL HIGH (ref 70–99)
Glucose-Capillary: 191 mg/dL — ABNORMAL HIGH (ref 70–99)
Glucose-Capillary: 198 mg/dL — ABNORMAL HIGH (ref 70–99)
Glucose-Capillary: 200 mg/dL — ABNORMAL HIGH (ref 70–99)

## 2019-05-18 LAB — UREA NITROGEN, URINE: Urea Nitrogen, Ur: 290 mg/dL

## 2019-05-18 MED ORDER — LABETALOL HCL 5 MG/ML IV SOLN
10.0000 mg | INTRAVENOUS | Status: DC | PRN
  Administered 2019-05-18: 10 mg via INTRAVENOUS
  Filled 2019-05-18: qty 4

## 2019-05-18 MED ORDER — QUETIAPINE FUMARATE 50 MG PO TABS
100.0000 mg | ORAL_TABLET | Freq: Two times a day (BID) | ORAL | Status: DC
  Administered 2019-05-18 (×2): 100 mg
  Filled 2019-05-18 (×2): qty 2

## 2019-05-18 NOTE — Progress Notes (Addendum)
[facility] Progress Note  Subjective: UOP 89 yest, +2.7 L in, net 2.4 L +.  BUN/ Cr up 120/ 4.77 today.   Vitals:   05/18/19 0700 05/18/19 0744 05/18/19 0800 05/18/19 0900  BP: 94/64  (!) 113/59 133/67  Pulse: 61 79    Resp: 17  (!) 21 (!) 21  Temp:      TempSrc:      SpO2: 97% 99%    Weight:      Height:        Inpatient medications: . chlorhexidine gluconate (MEDLINE KIT)  15 mL Mouth Rinse BID  . Chlorhexidine Gluconate Cloth  6 each Topical Q0600  . feeding supplement (PRO-STAT SUGAR FREE 64)  30 mL Per Tube QID  . feeding supplement (VITAL AF 1.2 CAL)  1,000 mL Per Tube Q24H  . heparin  5,000 Units Subcutaneous Q8H  . insulin aspart  0-15 Units Subcutaneous Q4H  . insulin glargine  13 Units Subcutaneous Daily  . levothyroxine  75 mcg Per Tube Q0600  . mouth rinse  15 mL Mouth Rinse 10 times per day  . pantoprazole sodium  40 mg Per Tube Daily  . QUEtiapine  100 mg Per Tube BID   . sodium chloride 10 mL/hr at 05/18/19 0538  . sodium chloride 75 mL/hr at 05/18/19 0900  . dexmedetomidine (PRECEDEX) IV infusion 0.6 mcg/kg/hr (05/18/19 0900)  . fentaNYL infusion INTRAVENOUS 150 mcg/hr (05/18/19 0900)   sodium chloride, albuterol, fentaNYL, midazolam    Exam: Genon vent, lying flat, eyes closed No rash, cyanosis or gangrene Sclera anicteric, throatw ETT No jvd or bruits Chest clearant and lat bilat RRRsoft SEM rusb, no RG Abd soft ntnd no mass or ascites +bsmarkedly obese, no abd wall edema GUfoley draining clear yellow-amber urine small amts MS no joint effusions or deformity Extdiffuse lymphedema bilat LE's but no w/o sig pitting edema Neuro islethargic, on vent     Date                           Creat                           eGFR  04/2019, here             3.02 >> 4.47                  01/2018                        3.10- 3.80                    13- 16 ml/min    12/2017                        4.3 >> 3.1          2018                            1.01- 1.88          2017                           1.0- 1.2              2016  1.60- 2.49          2014                           1.20  2012                           1.00  Home meds: - metoprolol 12.5 bid/ Kdur 10 tid/ bumetanide 55m qd - amiodarone 200 am - levothyroxine qam - insulin NPH 50u bid - naproxen 220-440 prn  - montelukast 10 qd - prn's/ vitamins/ supplements  UA 10/31 > cloudy, 100 prot, >50 wbc, 11-20 rbc, few bact UNa 48, UCreat 130, FeNa = 1.16% CXR initial films w/ focal and diffuse IS opacities, f/u xrays show resolution of IS opacities and persistent R basilar air space disease     Assessment/ Plan:  Assessment/ Plan: 1. AKI on CKD IV - baseline creat 3.0- 3.5, creat up here w/ diuresis.  IS infiltrates ?CHF on initial films has resolved. Very difficult to determine vol status on exam. No nephrotoxins noted, not in shock. Prob hemodynamic cause of AKI. Started IVF's for poss intravasc vol depletion. BUN/ Cr up slightly today, cont IVF at 75/hr.  Pt is a poor  candidate for dialysis due to severe comorbidities and severe deconditioning. Have d/w CCM.  2. Acute on chronic hypercarbic resp failure - w/ CHF vs pna vs both on admission. SP 3d abx. Xrays have improved, still on vent.  3. Morbid obesity 4. HTN  5. IDDM 6. Hypothyroidism     Rob Cicily Bonano 05/18/2019, 9:48 AM  Iron/TIBC/Ferritin/ %Sat    Component Value Date/Time   IRON 65 02/05/2018 0603   TIBC 108 (L) 02/05/2018 0603   FERRITIN 143 02/05/2018 0603   IRONPCTSAT 60 (H) 02/05/2018 0603   Recent Labs  Lab 05/13/19 2346  05/17/19 0820 05/18/19 0338  NA 142   < > 140 141  K 5.6*   < > 4.2 4.4  CL 97*   < > 96* 100  CO2 34*   < > 30 28  GLUCOSE 117*   < > 184* 173*  BUN 61*   < > 113* 120*  CREATININE 3.00*   < > 4.47* 4.77*  CALCIUM 9.3   < > 8.4* 8.5*  PHOS  --    < > 3.4  --   ALBUMIN 3.2*  --  2.4*  --   INR 1.0  --   --   --    <  > = values in this interval not displayed.   Recent Labs  Lab 05/17/19 0820  AST 66*  ALT 30  ALKPHOS 109  BILITOT 0.4  PROT 6.2*   Recent Labs  Lab 05/18/19 0338  WBC 8.4  HGB 7.0*  HCT 24.8*  PLT 193

## 2019-05-18 NOTE — Progress Notes (Signed)
NAME:  Loan Bonitz, MRN:  [id number], DOB:  01-Aug-1945, LOS: 4 ADMISSION DATE:  05/13/2019, CONSULTATION DATE:  05/18/19 REFERRING MD: Dr. Roxanne Mins , CHIEF COMPLAINT:  Hypercarbic Respiratory Failure   Brief History   73 year old female with history significant for obesity, OSA, CKD who was brought in by family for 1 week of dyspnea, edema, cough and chest pressure.  ABG in the ED showed respiratory acidosis with CO2 >97 she was intubated and PCCM consulted for admission.   Past Medical History   Past Medical History:  Diagnosis Date   Anemia    Arthritis    Asthma    Atrial fibrillation ([place])    CKD (chronic kidney disease)    Degenerative disc disease    Diabetes mellitus    Diabetic neuropathy ([place])    Edema extremities    Hypertension    Lymphedema    Morbid obesity ([place])    OSA (obstructive sleep apnea)    Pneumonia    Urinary incontinence      Significant Hospital Events   12/28 Admit to PCCM  Consults:  Nephro  Procedures:  ETT 10/28 >>  Significant Diagnostic Tests:  10/28 CT head>>no acute intracranial findings 10/28 echo LVEF 60 to [id number], grade 2 diastolic dysfunction, normal RV, normal atria, normal valves  Micro Data:  10/28 Sars-CoV-2>>neg 10/28 resp >> negative 10/28 RVP >> neg [facility] 10/27 >>  Trach aspirate 10/31>>  Antimicrobials:  ceftx 10/28 >>10/30 azithro 10/28 >> 10/30  Interim history/subjective:  Agitated off sedation.    Objective   Blood pressure 133/67, pulse 79, temperature 97.6 F (36.4 C), temperature source Oral, resp. rate (!) 21, height 5\' 7"  (1.702 m), weight (!) 171.6 kg, SpO2 99 %.    Vent Mode: PRVC FiO2 (%):  [40 %-50 %] 40 % Set Rate:  [14 bmp-15 bmp] 15 bmp Vt Set:  [490 mL] 490 mL PEEP:  [8 cmH20-10 cmH20] 8 cmH20 Plateau Pressure:  [20 cmH20-29 cmH20] 29 cmH20   Intake/Output Summary (Last 24 hours) at 05/18/2019 0957 Last data filed at 05/18/2019 0900 Gross per 24 hour  Intake 2957.5 ml    Output 475 ml  Net 2482.5 ml   Filed Weights   05/16/19 0333 05/17/19 0254 05/18/19 0500  Weight: (!) 166.2 kg (!) 171.8 kg (!) 171.6 kg    General: Obese elderly woman lying in bed no acute distress, intubated, sedated. HEENT: [place]/AT, oral mucosa moist, eyes anicteric. Neuro: RASS -5 on sedation, with sedation reduced, RASS +1.  Tracked his nurse 1 time, but would not repeat.  Not following any commands.  Moving all extremities spontaneously.  Not responding to verbal stimulation CV: Regular rate and rhythm, no murmurs PULM: Distant breath sounds, dyssynchronous when more agitated.  Tolerating PS+ CPAP 10/8 GI: Obese, soft, nontender Extremities: Chronic lower extremity lymphedema, no cyanosis Skin: No rashes or wounds  BNP 250--> 1581--> 204   Resolved Hospital Problem list     Assessment & Plan:  Acute hypoxic and hypercarbic respiratory failure requiring mechanical ventilation.  Mucous plugging today. -Suspect this is secondary to volume overload with obesity hypoventilation and noncompliant CPAP use , bicarbonate is high suggesting OHS P: -Continue LTV, 8 cc/kg ideal body weight, goal plateau less than 30 and driving pressure less than 15. -Daily SAT and SBT.  Encephalopathy and agitation remain a major limitation to ventilator weaning.  Acute on chronic heart failure-HFpEF hypervolemia.  History of atrial fibrillation, no evidence of acute coronary syndrome.  At presentation had complained of  chest discomfort and shortness of breath.  Has a history of noncompliance with medications and CPAP. -LVEF 60 to 65%, elevated LVEDP, diastolic dysfunction. P: -Worsening renal function with diuresis, per nephrology pursuing a fluid challenge to see if this improves things.  Unfortunately her fluid balance has been positive the entire admission.  I think this is going to be a major limitation to improvement in her respiratory failure.  Type 2 diabetes, A1c 6.7- diet-controlled per  daughter.  Better controlled hyperglycemia. P: -Continue Lantus to 13 units daily -Continue Accu-Cheks every 4 hours with sliding scale insulin as needed -Goal BG 140-180 while admitted to the ICU  Hypothyroidism; TSH 9.2 at admission -Continue Synthroid at PTA dose 75 mcg daily  AKI on Stage III CKD- suspect cardiorenal syndrome. Oliguric despite aggressive diuretic doses.  Renal ultrasound does not demonstrate hydronephrosis. Fe Urea 8.8%, suggesting prerenal azotemia. Hyperkalemia-resolved P: -Appreciate nephrology's assistance -Measure I/O and record -Continue fluid challenge per nephrology; agree that she is a poor candidate for dialysis.  Acute encephalopathy -suspect metabolic/delirium.  Azotemia is likely worsening this. Generalized Deconditioning- per daughter pt lives with family and is mentally very sharp, but is afraid of falling so rarely gets out of the chair, they have PT coming once per week -Minimizing sedation as much as possible; goal RASS 0 to -1.  Continue fentanyl and Precedex for sedation -Delirium precautions -Continue Seroquel -QTc WNL on 10/31 -PT and OT once appropriate  Prolonged QTc due to meds-resolved -Continue Seroquel; be cautious starting other QTC prolonging meds  I had a lengthy goals of care discussion with Mrs. Crumpler's daughter [name] at bedside.  One of her other daughters was on speaker phone at the time.  I relayed to them my concerns that her delirium has worsened since admission, which is at least in part likely due to her worsening kidney function.  Several family members were on the phone attempting to get her to open her eyes or follow commands, which she was not able to do at that time.  Her daughter understands that sedation has been lightened, but periodically she has required more sedation due to agitation.  Overall they are concerned that she was lucid at baseline, although chronically ill, and have asked multiple times about using  inotropic therapy to "kick start" her kidneys.  I have explained why with diastolic dysfunction inotropic therapy can worsen heart failure rather than helping it, which is the opposite effect that would happen in systolic heart failure.  They understand that nephrology feels that she is a poor candidate for dialysis given her chronic comorbidities, and I am very concerned about her ability to do dialysis long-term as well.  The main barriers to her coming off the ventilator are delirium and failed SBT's due to hypoventilation, but I am also concerned that we may not have corrected the primary problem that caused her respiratory failure.  Best practice:  Diet: TF Pain/Anxiety/Delirium protocol (if indicated): Fentanyl/precedex gtt VAP protocol (if indicated): Yes  DVT prophylaxis: Heparin and SCD GI prophylaxis: Protonix Glucose control: Sliding scale insulin, Lantus Mobility: Bedrest Code Status: Full Family Communication: Updated daughters today- Stephanie at [place] by phone. Disposition: ICU   This patient is critically ill with multiple organ system failure which requires frequent high complexity decision making, assessment, support, evaluation, and titration of therapies. This was completed through the application of advanced monitoring technologies and extensive interpretation of multiple databases. During this encounter critical care time was devoted to patient care services  described in this note for >80 minutes.  Julian Hy, DO 05/18/19 11:13 AM [facility]

## 2019-05-18 NOTE — Plan of Care (Signed)
Vastly improved mental status on total sedation holiday today. Following commands, Facetiming with family, writing on a clipboard. Her daughter [name] has been at bedside throughout the day.  Overnight please limit sedation as much as possible with plans for SBT in AM. She would need extubation to bipap.  Julian Hy, DO 05/18/19 4:00 PM  Pulmonary & Critical Care

## 2019-05-18 NOTE — Progress Notes (Signed)
Per patient family request, family wants to be present with the possible extubation tomorrow. Pt is following commands but responds really well with family present.   Dewaine Oats, RN

## 2019-05-19 LAB — CBC
HCT: 25.1 % — ABNORMAL LOW (ref 36.0–46.0)
Hemoglobin: 6.7 g/dL — CL (ref 12.0–15.0)
MCH: 26.7 pg (ref 26.0–34.0)
MCHC: 26.7 g/dL — ABNORMAL LOW (ref 30.0–36.0)
MCV: 100 fL (ref 80.0–100.0)
Platelets: 249 10*3/uL (ref 150–400)
RBC: 2.51 MIL/uL — ABNORMAL LOW (ref 3.87–5.11)
RDW: 14.6 % (ref 11.5–15.5)
WBC: 10.3 10*3/uL (ref 4.0–10.5)
nRBC: 0 % (ref 0.0–0.2)

## 2019-05-19 LAB — RENAL FUNCTION PANEL
Albumin: 2.4 g/dL — ABNORMAL LOW (ref 3.5–5.0)
Anion gap: 11 (ref 5–15)
BUN: 127 mg/dL — ABNORMAL HIGH (ref 8–23)
CO2: 29 mmol/L (ref 22–32)
Calcium: 8.4 mg/dL — ABNORMAL LOW (ref 8.9–10.3)
Chloride: 100 mmol/L (ref 98–111)
Creatinine, Ser: 4.62 mg/dL — ABNORMAL HIGH (ref 0.44–1.00)
GFR calc Af Amer: 10 mL/min — ABNORMAL LOW (ref >60)
GFR calc non Af Amer: 9 mL/min — ABNORMAL LOW (ref >60)
Glucose, Bld: 169 mg/dL — ABNORMAL HIGH (ref 70–99)
Phosphorus: 3.3 mg/dL (ref 2.5–4.6)
Potassium: 4.4 mmol/L (ref 3.5–5.1)
Sodium: 140 mmol/L (ref 135–145)

## 2019-05-19 LAB — BASIC METABOLIC PANEL
Anion gap: 13 (ref 5–15)
BUN: 127 mg/dL — ABNORMAL HIGH (ref 8–23)
CO2: 30 mmol/L (ref 22–32)
Calcium: 9 mg/dL (ref 8.9–10.3)
Chloride: 101 mmol/L (ref 98–111)
Creatinine, Ser: 4.82 mg/dL — ABNORMAL HIGH (ref 0.44–1.00)
GFR calc Af Amer: 10 mL/min — ABNORMAL LOW (ref >60)
GFR calc non Af Amer: 8 mL/min — ABNORMAL LOW (ref >60)
Glucose, Bld: 94 mg/dL (ref 70–99)
Potassium: 4.7 mmol/L (ref 3.5–5.1)
Sodium: 144 mmol/L (ref 135–145)

## 2019-05-19 LAB — POCT I-STAT 7, (LYTES, BLD GAS, ICA,H+H)
Acid-Base Excess: 5 mmol/L — ABNORMAL HIGH (ref 0.0–2.0)
Bicarbonate: 31.7 mmol/L — ABNORMAL HIGH (ref 20.0–28.0)
Calcium, Ion: 1.19 mmol/L (ref 1.15–1.40)
HCT: 21 % — ABNORMAL LOW (ref 36.0–46.0)
Hemoglobin: 7.1 g/dL — ABNORMAL LOW (ref 12.0–15.0)
O2 Saturation: 93 %
Patient temperature: 98.3
Potassium: 4.6 mmol/L (ref 3.5–5.1)
Sodium: 141 mmol/L (ref 135–145)
TCO2: 34 mmol/L — ABNORMAL HIGH (ref 22–32)
pCO2 arterial: 64.3 mmHg — ABNORMAL HIGH (ref 32.0–48.0)
pH, Arterial: 7.3 — ABNORMAL LOW (ref 7.350–7.450)
pO2, Arterial: 76 mmHg — ABNORMAL LOW (ref 83.0–108.0)

## 2019-05-19 LAB — CULTURE, BLOOD (ROUTINE X 2)
Culture: NO GROWTH
Culture: NO GROWTH
Special Requests: ADEQUATE
Special Requests: ADEQUATE

## 2019-05-19 LAB — GLUCOSE, CAPILLARY
Glucose-Capillary: 160 mg/dL — ABNORMAL HIGH (ref 70–99)
Glucose-Capillary: 150 mg/dL — ABNORMAL HIGH (ref 70–99)
Glucose-Capillary: 111 mg/dL — ABNORMAL HIGH (ref 70–99)
Glucose-Capillary: 122 mg/dL — ABNORMAL HIGH (ref 70–99)
Glucose-Capillary: 99 mg/dL (ref 70–99)
Glucose-Capillary: 87 mg/dL (ref 70–99)

## 2019-05-19 MED ORDER — AMIODARONE HCL 200 MG PO TABS
200.0000 mg | ORAL_TABLET | Freq: Every day | ORAL | Status: DC
  Administered 2019-05-19 – 2019-05-31 (×13): 200 mg via ORAL
  Filled 2019-05-19 (×13): qty 1

## 2019-05-19 MED ORDER — METOPROLOL TARTRATE 12.5 MG HALF TABLET
12.5000 mg | ORAL_TABLET | Freq: Two times a day (BID) | ORAL | Status: DC
  Administered 2019-05-19 – 2019-05-31 (×23): 12.5 mg via ORAL
  Filled 2019-05-19 (×23): qty 1

## 2019-05-19 MED ORDER — ORAL CARE MOUTH RINSE
15.0000 mL | Freq: Two times a day (BID) | OROMUCOSAL | Status: DC
  Administered 2019-05-19 – 2019-05-31 (×15): 15 mL via OROMUCOSAL

## 2019-05-19 MED ORDER — SODIUM CHLORIDE 0.9% IV SOLUTION
Freq: Once | INTRAVENOUS | Status: AC
  Administered 2019-05-20: 03:00:00 via INTRAVENOUS

## 2019-05-19 NOTE — Progress Notes (Signed)
Facilitated video chats with daughter, Gilmore Laroche, and daughter, [name] to try and convince patient to wear bipap. Patient continuing to adamently refuse mask at this time. Daughter, [name], asked that we try Bipap tomorrow while she is there instead. Will pass on to day shift. Sats are maintaining >95% on HFNC. Initiated EtCO2 monitoring as well for additional support. Will continue to monitor.

## 2019-05-19 NOTE — Progress Notes (Signed)
RT attempted to place bipap mask on pt. pt. Ripped the mask off. Pt. Refuses to wear the mask. RN notified.

## 2019-05-19 NOTE — Progress Notes (Signed)
[facility] Progress Note Patient Name: Jodi Underwood DOB: Jul 03, 1946 MRN: [id number]   Date of Service  05/19/2019  HPI/Events of Note  Multiple issues: 1. ABG on HFNC = 7.30/64.3/76.0/31.7 with sat = 93% and 2. Hgb = 6.7. Patient refusing to wear BiPAP.   eICU Interventions  Will order: 1. Continue present respiratory care.  2. Transfuse 1 unit PRBC.     Intervention Category Major Interventions: Acid-Base disturbance - evaluation and management  Chika Cichowski [place] 05/19/2019, 11:28 PM

## 2019-05-19 NOTE — Progress Notes (Signed)
[facility] Progress Note Patient Name: Jodi Underwood DOB: [birth year] MRN: [id number]   Date of Service  05/19/2019  HPI/Events of Note  Multiple issues: 1. Lethargic - Request for ABG and 2. Looks pale - Last Hgb = 7.0. Request for CBC.   eICU Interventions  Will order CBC and ABG STAT.      Intervention Category Major Interventions: Change in mental status - evaluation and management  Fidelis Loth Cornelia Copa 05/19/2019, 8:38 PM

## 2019-05-19 NOTE — Progress Notes (Signed)
ICU Transitions of Care Pharmacist Intervention    Jodi Underwood is a 73 y.o. female admitted on 05/13/2019  from home, and has been in the intensive care unit for 5d 8h.  Medical History: Past Medical History:  Diagnosis Date  . Anemia   . Arthritis   . Asthma   . Atrial fibrillation ([place])   . CKD (chronic kidney disease)   . Degenerative disc disease   . Diabetes mellitus   . Diabetic neuropathy ([place])   . Edema extremities   . Hypertension   . Lymphedema   . Morbid obesity ([place])   . OSA (obstructive sleep apnea)   . Pneumonia   . Urinary incontinence      Home Medication List:  Medications Prior to Admission  Medication Sig Dispense Refill Last Dose  . albuterol (VENTOLIN HFA) 108 (90 Base) MCG/ACT inhaler Inhale 2 puffs into the lungs 2 (two) times daily as needed for wheezing or shortness of breath.    unk at [employer]  . amiodarone (PACERONE) 200 MG tablet Take 200 mg by mouth daily after breakfast.    05/13/2019 at am  . bumetanide (BUMEX) 2 MG tablet Take 2 mg by mouth daily after breakfast.    unk at unk  . cyclobenzaprine (FLEXERIL) 10 MG tablet Take 10 mg by mouth every 6 (six) hours as needed for muscle spasms.   unk at [employer]  . diphenhydramine-acetaminophen (TYLENOL PM) 25-500 MG TABS tablet Take 1 tablet by mouth at bedtime as needed (for sleep).   unk at [employer]  . Hydrocortisone (GERHARDT'S BUTT CREAM) CREA Apply 1 application topically 2 (two) times daily.   unk at [employer]  . Insulin NPH, Human,, Isophane, (NOVOLIN N FLEXPEN RELION) 100 UNIT/ML Kiwkpen Inject 50 Units into the skin 2 (two) times daily.    unk at unk  . levothyroxine (EUTHYROX) 25 MCG tablet Take 12.5 mcg by mouth daily before breakfast.    05/13/2019 at am  . levothyroxine (EUTHYROX) 50 MCG tablet Take 50 mcg by mouth daily before breakfast.   05/13/2019 at am  . naproxen sodium (ALEVE) 220 MG tablet Take 220-440 mg by mouth 2 (two) times daily as needed (for pain or headaches).   unk at [employer]  . potassium  chloride (K-DUR) 10 MEQ tablet Take 10 mEq by mouth 3 (three) times daily with meals.    unk at [employer]  . acetaminophen (TYLENOL) 325 MG tablet Take 325-650 mg by mouth every 6 (six) hours as needed (For pain.).    Not Taking at Unknown time  . cholestyramine light (PREVALITE) 4 g packet Take 1 packet (4 g total) by mouth 3 (three) times daily. 42 packet 0 unk at unk  . fluticasone (FLONASE) 50 MCG/ACT nasal spray Place 2 sprays into both nostrils daily. (Patient not taking: Reported on 05/15/2019) 16 g 2 Not Taking at Unknown time  . guaiFENesin (MUCINEX) 600 MG 12 hr tablet Take 600 mg by mouth 2 (two) times daily as needed for to loosen phlegm.    Not Taking at Unknown time  . metoprolol tartrate (LOPRESSOR) 25 MG tablet Take 0.5 tablets (12.5 mg total) by mouth 2 (two) times daily.   SEE NOTE at UNK  . montelukast (SINGULAIR) 10 MG tablet Take 10 mg by mouth daily.   Not Taking at Unknown time  . nystatin cream (MYCOSTATIN) Apply 1 application topically 2 (two) times daily.   unk at [employer]  . promethazine (PHENERGAN) 25 MG tablet Take 25 mg by mouth  every 6 (six) hours as needed for nausea or vomiting.   unk at unk  . saccharomyces boulardii (FLORASTOR) 250 MG capsule Take 1 capsule (250 mg total) by mouth 3 (three) times daily.   unk at Unknown time     Has a pharmacist made an intervention on this patient's medication list?  yes, because the patient stayed in medical-surgical ICU for > 48 hours, is discharging from ICU to another level of care within [facility], and has active orders for medications that should be discontinued upon transition out of ICU.    Transitions of care were discussed with Dr. Lynetta Mare and the following interventions were made:  Antipsychotics/anticonvulsants:  Antipsychotic/anticonvulsant was started during ICU admission and not intended to be continued at discharge. quetiapine was discontinued.  Stress Ulcer Prophylaxis:  A medication for stress ulcer prophylaxis was  started during ICU admission and not intended to be continued at discharge. pantoprazole  was discontinued    May 19, 2019    Thank you,   Eddie Candle, PharmD PGY-1 Pharmacy Resident   Please check amion for clinical pharmacist contact number

## 2019-05-19 NOTE — Progress Notes (Signed)
[facility] NEPHROLOGY PROGRESS NOTE  Assessment/ Plan: Pt is a 73 y.o. yo female with history of obesity, OSA, CKD stage IV admitted with respiratory failure, consulted for AKI on CKD.  #AKI on CKD stage IV: Baseline creatinine level around 3-3.5.  AKI likely hemodynamically mediated.  UA likely contaminant.  Kidney US with no obstruction, right kidney small.  Currently on IVF.  Serum creatinine level is is stable at 4.6 however with high BUN level.  Difficult to assess volume status.  She is currently n.p.o. therefore agree with continuing IV fluid for another 24 hours.  Order a dose of albumin.  Monitor BMP, urine output.  Avoid nephrotoxins.  She is probably poor candidate for long-term dialysis.  #Acute hypoxic and hypercapnic respiratory failure on mechanical ventilation: Per pulmonary team.  Plan for extubation noted.  #CHF, EF 60 to 65%: She is probably intravascularly depleted.  #Acute encephalopathy, metabolic: Minimize opiates or sedatives  #General deconditioning  Subjective: Seen and examined at bedside.  She was alert.  On mechanical ventilation.  Review of system limited. Objective Vital signs in last 24 hours: Vitals:   05/19/19 0700 05/19/19 0706 05/19/19 0718 05/19/19 0740  BP: 100/73 100/73    Pulse: 89 88    Resp: 14 16    Temp:   98.4 F (36.9 C)   TempSrc:   Axillary   SpO2: 99% 99%  98%  Weight:      Height:       Weight change: 1.9 kg  Intake/Output Summary (Last 24 hours) at 05/19/2019 0848 Last data filed at 05/19/2019 0800 Gross per 24 hour  Intake 2816.35 ml  Output 1405 ml  Net 1411.35 ml       Labs: Basic Metabolic Panel: Recent Labs  Lab 05/16/19 0226  05/17/19 0820 05/18/19 0338 05/19/19 0705  NA 144   < > 140 141 140  K 3.9   < > 4.2 4.4 4.4  CL 100  --  96* 100 100  CO2 32  --  30 28 29   GLUCOSE 175*  --  184* 173* 169*  BUN 87*  --  113* 120* 127*  CREATININE 3.77*  --  4.47* 4.77* 4.62*  CALCIUM 8.8*  --  8.4*  8.5* 8.4*  PHOS 1.9*  --  3.4  --  3.3   < > = values in this interval not displayed.   Liver Function Tests: Recent Labs  Lab 05/13/19 2346 05/17/19 0820 05/19/19 0705  AST 15 66*  --   ALT 12 30  --   ALKPHOS 71 109  --   BILITOT 0.5 0.4  --   PROT 7.6 6.2*  --   ALBUMIN 3.2* 2.4* 2.4*   Recent Labs  Lab 05/13/19 2346  LIPASE 34   Recent Labs  Lab 05/13/19 2346  AMMONIA 55*   CBC: Recent Labs  Lab 05/13/19 2346  05/14/19 0319 05/16/19 0226 05/16/19 1037 05/17/19 0820 05/18/19 0338  WBC 12.3*  --  13.0* 6.9  --  6.3 8.4  NEUTROABS 9.8*  --   --   --   --   --   --   HGB 8.4*   < > 8.1* 7.2* 7.5* 7.1* 7.0*  HCT 32.0*   < > 30.0* 24.8* 22.0* 24.5* 24.8*  MCV 102.2*  --  98.7 91.9  --  95.3 95.0  PLT 276  --  232 220  --  212 193   < > = values in this interval  not displayed.   Cardiac Enzymes: No results for input(s): CKTOTAL, CKMB, CKMBINDEX, TROPONINI in the last 168 hours. CBG: Recent Labs  Lab 05/18/19 1519 05/18/19 1931 05/18/19 2349 05/19/19 0338 05/19/19 0715  GLUCAP 179* 191* 198* 160* 150*    Iron Studies: No results for input(s): IRON, TIBC, TRANSFERRIN, FERRITIN in the last 72 hours. Studies/Results: US Renal  Result Date: 05/17/2019 CLINICAL DATA:  Acute kidney injury. EXAM: RENAL / URINARY TRACT ULTRASOUND COMPLETE COMPARISON:  None. FINDINGS: Right Kidney: Renal measurements: 9.8 x 5.0 x 4.3 cm = volume: 111 mL. Small kidney with cortical atrophy. No hydronephrosis or focal lesions. Left Kidney: Renal measurements: 9.9 x 5.1 x 6.3 cm = volume: 165 mL. 3 cm simple cyst of the upper pole. No hydronephrosis or solid masses. Bladder: The bladder was not visualized and is likely decompressed. IMPRESSION: Smaller right kidney compared to the left.  No hydronephrosis. Electronically Signed   By: Aletta Edouard M.D.   On: 05/17/2019 11:50   Dg Chest Port 1 View  Result Date: 05/17/2019 CLINICAL DATA:  Respiratory failure and mucous plugging.  EXAM: PORTABLE CHEST 1 VIEW COMPARISON:  05/16/2019 FINDINGS: Endotracheal tube remains with the tip approximately 5 cm above the carina. Gastric decompression tube extends below the diaphragm. Residual opacity in the right medial lung likely corresponds to right lower lobe and also potentially right middle lobe atelectasis. No significant pleural fluid or evidence of pneumothorax. IMPRESSION: Residual opacity in the right medial lung likely corresponds to right lower lobe and right middle lobe atelectasis. Endotracheal tube remains 5 cm above the carina. Electronically Signed   By: Aletta Edouard M.D.   On: 05/17/2019 11:40    Medications: Infusions: . sodium chloride Stopped (05/19/19 0600)  . sodium chloride 75 mL/hr at 05/19/19 0800    Scheduled Medications: . amiodarone  200 mg Oral Daily  . chlorhexidine gluconate (MEDLINE KIT)  15 mL Mouth Rinse BID  . Chlorhexidine Gluconate Cloth  6 each Topical Q0600  . feeding supplement (PRO-STAT SUGAR FREE 64)  30 mL Per Tube QID  . feeding supplement (VITAL AF 1.2 CAL)  1,000 mL Per Tube Q24H  . heparin  5,000 Units Subcutaneous Q8H  . insulin aspart  0-15 Units Subcutaneous Q4H  . insulin glargine  13 Units Subcutaneous Daily  . levothyroxine  75 mcg Per Tube Q0600  . mouth rinse  15 mL Mouth Rinse 10 times per day  . metoprolol tartrate  12.5 mg Oral BID  . pantoprazole sodium  40 mg Per Tube Daily  . QUEtiapine  100 mg Per Tube BID    have reviewed scheduled and prn medications.  Physical Exam: General: Intubated, alert and following simple commands Heart:RRR, s1s2 nl Lungs: Coarse breath sound bilateral, no wheezing Abdomen:soft, Non-tender, distended with marked obesity no abdominal wall edema Extremities: Decreased lymphedema bilateral with no pitting edema Neurology: Alert awake and following simple commands  Jodi Underwood [name] 05/19/2019,8:48 AM  LOS: 5 days  Pager: [phone]

## 2019-05-19 NOTE — Progress Notes (Signed)
NAME:  Sylvana Voller, MRN:  [id number], DOB:  09/14/1945, LOS: 5 ADMISSION DATE:  05/13/2019, CONSULTATION DATE:  05/19/19 REFERRING MD: Dr. Roxanne Mins , CHIEF COMPLAINT:  Hypercarbic Respiratory Failure   Brief History   73 year old female with history significant for obesity, OSA, CKD who was brought in by family for 1 week of dyspnea, edema, cough and chest pressure.  ABG in the ED showed respiratory acidosis with CO2 >97 she was intubated and PCCM consulted for admission.   Past Medical History   Past Medical History:  Diagnosis Date  . Anemia   . Arthritis   . Asthma   . Atrial fibrillation ([place])   . CKD (chronic kidney disease)   . Degenerative disc disease   . Diabetes mellitus   . Diabetic neuropathy ([place])   . Edema extremities   . Hypertension   . Lymphedema   . Morbid obesity ([place])   . OSA (obstructive sleep apnea)   . Pneumonia   . Urinary incontinence      Significant Hospital Events   12/28 Admit to PCCM  Consults:  Nephro  Procedures:  ETT 10/28 >>  Significant Diagnostic Tests:  10/28 CT head>>no acute intracranial findings 10/28 echo LVEF 60 to [id number], grade 2 diastolic dysfunction, normal RV, normal atria, normal valves  Micro Data:  10/28 Sars-CoV-2>>neg 10/28 resp >> negative 10/28 RVP >> neg [facility] 10/27 >>  Trach aspirate 10/31>>  Antimicrobials:  ceftx 10/28 >>10/30 azithro 10/28 >> 10/30  Interim history/subjective:  Alert and interactive, turned to PSV 5/5 with Vt's 400-500.  RSBI: 40.  Patient shakes head no when asked if any pain or difficulty breathing No labs this am, ordered to complete renal function panel now   Objective   Blood pressure (!) 118/97, pulse 85, temperature 98.4 F (36.9 C), temperature source Axillary, resp. rate 19, height 5\' 7"  (1.702 m), weight (!) 173.5 kg, SpO2 100 %.    Vent Mode: PRVC FiO2 (%):  [40 %] 40 % Set Rate:  [14 bmp-15 bmp] 14 bmp Vt Set:  [490 mL] 490 mL PEEP:  [8 cmH20] 8 cmH20 Plateau  Pressure:  [12 cmH20-34 cmH20] 34 cmH20   Intake/Output Summary (Last 24 hours) at 05/19/2019 0726 Last data filed at 05/19/2019 0600 Gross per 24 hour  Intake 2969.23 ml  Output 1430 ml  Net 1539.23 ml   Filed Weights   05/17/19 0254 05/18/19 0500 05/19/19 0405  Weight: (!) 171.8 kg (!) 171.6 kg (!) 173.5 kg    General: Obese, intubated, off sedation HEENT: [place]/AT, moist membranes Neuro: Alert, following commands in all extremities. Intubated CV: RRR, warm and well perfused.   PULM: Lungs clear, minimal secretions via ETT GI: Obese, soft, nontender, nondistended Extremities: Lower extremity lymphedema, chronic Skin: No rashes  Resolved Hospital Problem list     Assessment & Plan:  Acute hypoxic and hypercarbic respiratory failure requiring mechanical ventilation.   -Suspect this is secondary to volume overload with obesity hypoventilation and noncompliant CPAP use , bicarbonate is high suggesting OHS P: -Tolerating SBT this am, anticipate extubation this morning --Will need extubation to Bipap/CPAP with continued CPAP at night  Acute on chronic heart failure-HFpEF  Hypervolemia.   History of atrial fibrillation HTN -LVEF 60 to 65%, elevated LVEDP, diastolic dysfunction grade II. P: -Resume home amiodarone, metoprolol  Type 2 diabetes, A1c 6.7-  --diet-controlled per daughter.  P: --Controlled at goal 140-180 with Lantus 13 units daily and SSI q 4 hours  Hypothyroidism;  -TSH 9.2 at admission  P: -Home synthroid: 75 mcg daily  AKI on Stage III CKD- suspect cardiorenal syndrome.  Renal ultrasound without hydronephrosis.  Hyperkalemia-resolved P: --IVF per Nephrology --Pending AM labs  Acute encephalopathy -suspect metabolic/delirium.   Azotemia --Improved today P: -Delirium precautions -Continue Seroquel   Generalized Deconditioning -- per daughter pt lives with family and is mentally very sharp, but is afraid of falling so rarely gets out of the chair,  they have PT coming once per week P: -PT and OT once appropriate  Prolonged QTc due to meds-resolved -Continue Seroquel; be cautious starting other QTC prolonging meds  Best practice:  Diet: TF Pain/Anxiety/Delirium protocol (if indicated):  VAP protocol (if indicated): Yes  DVT prophylaxis: Heparin and SCD GI prophylaxis: Protonix Glucose control: Sliding scale insulin, Lantus Mobility: Bedrest. Mobilize after extubation Code Status: Full Family Communication: Will call and update daughters this am.  Disposition: ICU   This patient is critically ill with multiple organ system failure which requires frequent high complexity decision making, assessment, support, evaluation, and titration of therapies. This was completed through the application of advanced monitoring technologies and extensive interpretation of multiple databases. During this encounter critical care time was devoted to patient care services described in this note for >80 minutes.  Minda Ditto, DO 05/19/19 7:26 AM [facility]  Paulita Fujita, ACNP [facility]  Pager: [phone]

## 2019-05-19 NOTE — Progress Notes (Addendum)
Patient's daughter would like to speak with her medical team tomorrow. She states that she did not see any providers today and would like to know what is going on and the plan. Will pass on to day shift.

## 2019-05-19 NOTE — Progress Notes (Signed)
Camera in daughters to try to persuade Pt to wear Bipap via elink

## 2019-05-19 NOTE — Progress Notes (Addendum)
Patient refusing bipap at this time. Educated on importance of therapy. PH 7.30 pCO2 64.3 pO2 76 HCO3 31.7. Pt sats maintaining >95% on HFNC 6Lpm. Patient extubated earlier today. Will continue to monitor. [place] notified.  Patient also refusing bath x3 times. Educated on importance of hygiene. Will continue to monitor.

## 2019-05-19 NOTE — Procedures (Signed)
Extubation Procedure Note  Patient Details:   Name: Jodi Underwood DOB: March 17, 1946 MRN: [id number]   Airway Documentation:    Vent end date: 05/19/19 Vent end time: 1110   Evaluation  O2 sats: stable throughout Complications: No apparent complications Patient did tolerate procedure well. Bilateral Breath Sounds: Clear, Diminished   Yes   Pt extubated to 6L HFNC per MD order. Pt stable throughout with no complications. VS within normal limits. Positive cuff leak noted prior to extubation. Pt did cough up moderate amount of frank red blood in ETT prior to extubation. Pt encouraged to use [place] to clear secretions. No stridor heard, slight expiratory wheeze  [name] 05/19/2019, 11:47 AM

## 2019-05-20 LAB — RENAL FUNCTION PANEL
Albumin: 2.6 g/dL — ABNORMAL LOW (ref 3.5–5.0)
Anion gap: 11 (ref 5–15)
BUN: 127 mg/dL — ABNORMAL HIGH (ref 8–23)
CO2: 29 mmol/L (ref 22–32)
Calcium: 8.9 mg/dL (ref 8.9–10.3)
Chloride: 103 mmol/L (ref 98–111)
Creatinine, Ser: 4.77 mg/dL — ABNORMAL HIGH (ref 0.44–1.00)
GFR calc Af Amer: 10 mL/min — ABNORMAL LOW (ref >60)
GFR calc non Af Amer: 8 mL/min — ABNORMAL LOW (ref >60)
Glucose, Bld: 100 mg/dL — ABNORMAL HIGH (ref 70–99)
Phosphorus: 4.8 mg/dL — ABNORMAL HIGH (ref 2.5–4.6)
Potassium: 4.9 mmol/L (ref 3.5–5.1)
Sodium: 143 mmol/L (ref 135–145)

## 2019-05-20 LAB — GLUCOSE, CAPILLARY
Glucose-Capillary: 86 mg/dL (ref 70–99)
Glucose-Capillary: 92 mg/dL (ref 70–99)
Glucose-Capillary: 99 mg/dL (ref 70–99)
Glucose-Capillary: 98 mg/dL (ref 70–99)
Glucose-Capillary: 94 mg/dL (ref 70–99)

## 2019-05-20 LAB — CULTURE, RESPIRATORY W GRAM STAIN: Culture: NORMAL

## 2019-05-20 LAB — ABO/RH: ABO/RH(D): O POS

## 2019-05-20 LAB — HEMOGLOBIN AND HEMATOCRIT, BLOOD
HCT: 27.4 % — ABNORMAL LOW (ref 36.0–46.0)
Hemoglobin: 7.4 g/dL — ABNORMAL LOW (ref 12.0–15.0)

## 2019-05-20 LAB — PREPARE RBC (CROSSMATCH)

## 2019-05-20 MED ORDER — BETHANECHOL CHLORIDE 5 MG PO TABS
5.0000 mg | ORAL_TABLET | Freq: Three times a day (TID) | ORAL | Status: DC
  Administered 2019-05-20 – 2019-05-31 (×34): 5 mg via ORAL
  Filled 2019-05-20 (×41): qty 1

## 2019-05-20 NOTE — Progress Notes (Signed)
Report called to Tammy, RN

## 2019-05-20 NOTE — Evaluation (Signed)
Clinical/Bedside Swallow Evaluation Patient Details  Name: Jodi Underwood MRN: [id number] Date of Birth: [birth year]  Today's Date: 05/20/2019 Time: SLP Start Time (ACUTE ONLY): 0845 SLP Stop Time (ACUTE ONLY): 0857 SLP Time Calculation (min) (ACUTE ONLY): 12 min  Past Medical History:  Past Medical History:  Diagnosis Date  . Anemia   . Arthritis   . Asthma   . Atrial fibrillation ([place])   . CKD (chronic kidney disease)   . Degenerative disc disease   . Diabetes mellitus   . Diabetic neuropathy ([place])   . Edema extremities   . Hypertension   . Lymphedema   . Morbid obesity ([place])   . OSA (obstructive sleep apnea)   . Pneumonia   . Urinary incontinence    Past Surgical History:  Past Surgical History:  Procedure Laterality Date  . ABDOMINAL HYSTERECTOMY     COMPLETE   HPI:  73 year old woman with obesity hypoventilation syndrome. Critically ill due to hypercarbic respiratory failure likely secondary to noncompliance with CPAP at home. Intubated from 10/28 to 11/2    Assessment / Plan / Recommendation Clinical Impression  Pt failed Yale swallow overnight and continues to demonstrate signs of aspiration (delayed coughing and immediate throat clearing) with large volume intake/continuous sips. She is unable to follow complex commands because she is hard of hearing. She will not drink 3 oz consecutively. Her cough is weak and congested and vocal qulaity is slightly wet at baseline. WHen given trials of ice and puree pt has no immediate signs of aspiration and verbalizes she is content with just ice for now. Recommend another 12-24 hours of recovery post extubation prior to diet initaition or FEES. For now pt may have ice chips and meds whole in puree. Will f/u tomorrow for need for FEES if impairment persists.  SLP Visit Diagnosis: Dysphagia, unspecified (R13.10)    Aspiration Risk  Moderate aspiration risk    Diet Recommendation NPO except meds;Ice chips PRN after oral care    Medication Administration: Whole meds with puree    Other  Recommendations Oral Care Recommendations: Oral care BID   Follow up Recommendations Skilled Nursing facility      Frequency and Duration min 2x/week  2 weeks       Prognosis Prognosis for Safe Diet Advancement: Good      Swallow Study   General HPI: 73 year old woman with obesity hypoventilation syndrome. Critically ill due to hypercarbic respiratory failure likely secondary to noncompliance with CPAP at home. Intubated from 10/28 to 11/2  Type of Study: Bedside Swallow Evaluation Previous Swallow Assessment: none Diet Prior to this Study: NPO Temperature Spikes Noted: No Respiratory Status: Nasal cannula History of Recent Intubation: Yes Length of Intubations (days): 6 days Date extubated: 05/19/19 Behavior/Cognition: Alert;Cooperative(very hard of hearing) Oral Care Completed by SLP: No Oral Cavity - Dentition: Adequate natural dentition Vision: Functional for self-feeding Self-Feeding Abilities: Able to feed self;Needs assist Patient Positioning: Partially reclined Baseline Vocal Quality: Hoarse;Wet Volitional Cough: Weak;Congested Volitional Swallow: Able to elicit    Oral/Motor/Sensory Function Overall Oral Motor/Sensory Function: Within functional limits   Ice Chips Ice chips: Within functional limits Presentation: Spoon   Thin Liquid Thin Liquid: Impaired Presentation: Straw Pharyngeal  Phase Impairments: Cough - Delayed;Throat Clearing - Immediate    Nectar Thick Nectar Thick Liquid: Not tested   Honey Thick Honey Thick Liquid: Not tested   Puree Puree: Within functional limits Presentation: Spoon   Solid     Solid: Impaired Oral Phase Impairments: Impaired mastication(reported dental pain, no  interest)     Herbie Baltimore, MA CCC-SLP  Acute Rehabilitation Services Pager [phone] Office [phone]  Lynann Beaver 05/20/2019,9:58 AM

## 2019-05-20 NOTE — Evaluation (Signed)
Physical Therapy Evaluation Patient Details Name: Jodi Underwood MRN: [id number] DOB: Sep 12, 1945 Today's Date: 05/20/2019   History of Present Illness  73 year old female with past medical history of morbid obesity, OSA, hypertension, type 2 diabetes, atrial fibrillation,  Asthma, CKD stage IV, heart failure, hypothyroidism who presents with 1 week of shortness of breath, cough and chest pressure.  Patient intubated in the ED and admitted to ICU10/28/20. acute hypercarbic respiratory failure likely r/t CPAP noncompliance c/b decompensated diastolic heart failure Extubated 05/19/19.   Clinical Impression  PTA pt living with daughter in single story home with level entry. Pt reports needing min A for getting into her power chair for mobility and independence with bathing and dressing. Pt is very hard of hearing which may be limiting some of her ability to follow commands. Pt agreeable to moving all 4 extremities, however refuses to attempt to get to side of bed requesting to go back to sleep. PT recommending SNF level rehab at discharge to improve strength and mobility for eventual return home. PT will continue to follow acutely.    Follow Up Recommendations SNF    Equipment Recommendations  Other (comment)(TBD at next venue)    Recommendations for Other Services OT consult     Precautions / Restrictions Precautions Precautions: Fall Precaution Comments: family reports multiple falls, causing fear of movement Restrictions Weight Bearing Restrictions: No      Mobility  Bed Mobility               General bed mobility comments: refuses bed mobility, request to go back to sleep            Pertinent Vitals/Pain Pain Assessment: Faces(Simultaneous filing. User may not have seen previous data.) Faces Pain Scale: Hurts a little bit    Home Living Family/patient expects to be discharged to:: Private residence Living Arrangements: Children Available Help at Discharge:  Family;Available PRN/intermittently Type of Home: House Home Access: Level entry     Home Layout: One level Home Equipment: Walker - 2 wheels;Bedside commode;Wheelchair - power      Prior Function Level of Independence: Needs assistance   Gait / Transfers Assistance Needed: needs minA for transfer to power chair  ADL's / Homemaking Assistance Needed: able to bathe and dress herself, needs assist for iADLs           Extremity/Trunk Assessment   Upper Extremity Assessment Upper Extremity Assessment: RUE deficits/detail;LUE deficits/detail;Difficult to assess due to impaired cognition(difficulty following commands due to [facility]) RUE Deficits / Details: ROM limited by body habitus, however able to lift over her head, strength grossly assed at 3+/5 LUE Deficits / Details: ROM limited by body habitus, PROM of shoulder to 100 degrees, strength grossly 3/5    Lower Extremity Assessment Lower Extremity Assessment: Difficult to assess due to impaired cognition;LLE deficits/detail;RLE deficits/detail RLE Deficits / Details: ROM limited due to body habitus, able to move hips, knees, and ankles, strength grossly 3+/5 in knees and ankles, hips in ER and difficult to assess strength  LLE Deficits / Details: 73 year old female with past medical history of morbid obesity, OSA, hypertension, type 2 diabetes, atrial fibrillation,  Asthma, CKD stage IV, heart failure, hypothyroidism who presents with 1 week of shortness of breath, cough and chest pressure.  Patient intubated in the ED and admitted to ICU10/28/20. acute hypercarbic respiratory failure likely r/t CPAP noncompliance c/b decompensated diastolic heart failure Extubated 05/19/19.        Communication   Communication: HOH;Other (comment)(need to write everything out)  Cognition  Arousal/Alertness: Awake/alert Behavior During Therapy: Flat affect Overall Cognitive Status: Impaired/Different from baseline Area of Impairment:  Orientation;Following commands;Problem solving                 Orientation Level: Disoriented to;Situation     Following Commands: Follows one step commands with increased time;Follows multi-step commands with increased time     Problem Solving: Slow processing;Requires verbal cues;Requires tactile cues;Decreased initiation General Comments: difficult to determine due to hard of hearing however appears to have improved since admission      General Comments General comments (skin integrity, edema, etc.): LE edema, Pt on 6L O2 via HFNC, [id number]%O41m, VSS        Assessment/Plan    PT Assessment Patient needs continued PT services  PT Problem List Decreased strength;Decreased range of motion;Decreased activity tolerance;Decreased balance;Decreased mobility;Decreased coordination;Decreased cognition;Decreased safety awareness;Cardiopulmonary status limiting activity;Obesity       PT Treatment Interventions DME instruction;Gait training;Functional mobility training;Therapeutic activities;Therapeutic exercise;Balance training;Cognitive remediation;Patient/family education    PT Goals (Current goals can be found in the Care Plan section)  Acute Rehab PT Goals Patient Stated Goal: go back to sleep PT Goal Formulation: With patient Time For Goal Achievement: 06/03/19 Potential to Achieve Goals: Fair    Frequency Min 2X/week    AM-PAC PT "6 Clicks" Mobility  Outcome Measure Help needed turning from your back to your side while in a flat bed without using bedrails?: Total Help needed moving from lying on your back to sitting on the side of a flat bed without using bedrails?: Total Help needed moving to and from a bed to a chair (including a wheelchair)?: Total Help needed standing up from a chair using your arms (e.g., wheelchair or bedside chair)?: Total Help needed to walk in hospital room?: Total Help needed climbing 3-5 steps with a railing? : Total 6 Click Score: 6     End of Session   Activity Tolerance: No increased pain;Other (comment)(pt refuses mobility, request to return to sleep) Patient left: in bed;with call bell/phone within reach;with bed alarm set Nurse Communication: Mobility status PT Visit Diagnosis: Other abnormalities of gait and mobility (R26.89);Muscle weakness (generalized) (M62.81);Difficulty in walking, not elsewhere classified (R26.2);Unsteadiness on feet (R26.81)    Time: [id number] PT Time Calculation (min) (ACUTE ONLY): 18 min   Charges:   PT Evaluation $PT Eval Moderate Complexity: 1 Mod          Panhia Karl B. Migdalia Dk PT, DPT Acute Rehabilitation Services Pager [phone] Office [phone]   [place] 05/20/2019, 10:11 AM

## 2019-05-20 NOTE — Progress Notes (Addendum)
[facility] NEPHROLOGY PROGRESS NOTE  Assessment/ Plan: Pt is a 72 y.o. yo female with history of obesity, OSA, CKD stage IV admitted with respiratory failure, consulted for AKI on CKD.  #AKI on CKD stage IV: Baseline creatinine level around 3-3.5.  AKI likely hemodynamically mediated.  UA likely contaminant.  Kidney US with no obstruction, right kidney small.  -Urine output is 1255 cc / 24-hour.  Serum creatinine level is a stable at 4.7 and BUN is 127.  I will discontinue IV fluid.  She may need tube feeding, defer to PCCM.  Monitor BMP, urine output.  Avoid nephrotoxins.  She is probably poor candidate for long-term dialysis.  #Ventilator dependent respiratory failure, now extubated nasal cannula.  Per pulmonary team.    #CHF, EF 60 to 65%: Initially diuresed with IV Lasix.  Holding further IV fluid.  #Anemia: Received blood transfusion.  I will check iron stores.  Transfusion as needed.  #Acute encephalopathy, metabolic: Minimize opiates or sedatives  #General deconditioning  Subjective: Seen and examined at bedside.  She is extubated.  Very hard of hearing.  Review of system limited. Objective Vital signs in last 24 hours: Vitals:   05/20/19 0548 05/20/19 0600 05/20/19 0700 05/20/19 0800  BP: (!) 157/68 (!) 167/64 (!) 142/58 (!) 145/70  Pulse: 78 82 80 77  Resp: 16 19 16 13   Temp: 97.8 F (36.6 C)  97.8 F (36.6 C)   TempSrc: Axillary  Oral   SpO2: 99% 99% 97% 100%  Weight:      Height:       Weight change: 0.3 kg  Intake/Output Summary (Last 24 hours) at 05/20/2019 0914 Last data filed at 05/20/2019 0900 Gross per 24 hour  Intake 2205.87 ml  Output 1340 ml  Net 865.87 ml       Labs: Basic Metabolic Panel: Recent Labs  Lab 05/17/19 0820  05/19/19 0705 05/19/19 2133 05/19/19 2230 05/20/19 0742  NA 140   < > 140 141 144 143  K 4.2   < > 4.4 4.6 4.7 4.9  CL 96*   < > 100  --  101 103  CO2 30   < > 29  --  30 29  GLUCOSE 184*   < > 169*  --   94 100*  BUN 113*   < > 127*  --  127* 127*  CREATININE 4.47*   < > 4.62*  --  4.82* 4.77*  CALCIUM 8.4*   < > 8.4*  --  9.0 8.9  PHOS 3.4  --  3.3  --   --  4.8*   < > = values in this interval not displayed.   Liver Function Tests: Recent Labs  Lab 05/13/19 2346 05/17/19 0820 05/19/19 0705 05/20/19 0742  AST 15 66*  --   --   ALT 12 30  --   --   ALKPHOS 71 109  --   --   BILITOT 0.5 0.4  --   --   PROT 7.6 6.2*  --   --   ALBUMIN 3.2* 2.4* 2.4* 2.6*   Recent Labs  Lab 05/13/19 2346  LIPASE 34   Recent Labs  Lab 05/13/19 2346  AMMONIA 55*   CBC: Recent Labs  Lab 05/13/19 2346  05/14/19 0319 05/16/19 0226  05/17/19 0820 05/18/19 0338 05/19/19 2133 05/19/19 2230 05/20/19 0742  WBC 12.3*  --  13.0* 6.9  --  6.3 8.4  --  10.3  --   NEUTROABS 9.8*  --   --   --   --   --   --   --   --   --  HGB 8.4*   < > 8.1* 7.2*   < > 7.1* 7.0* 7.1* 6.7* 7.4*  HCT 32.0*   < > 30.0* 24.8*   < > 24.5* 24.8* 21.0* 25.1* 27.4*  MCV 102.2*  --  98.7 91.9  --  95.3 95.0  --  100.0  --   PLT 276  --  232 220  --  212 193  --  249  --    < > = values in this interval not displayed.   Cardiac Enzymes: No results for input(s): CKTOTAL, CKMB, CKMBINDEX, TROPONINI in the last 168 hours. CBG: Recent Labs  Lab 05/19/19 1454 05/19/19 1915 05/19/19 2319 05/20/19 0300 05/20/19 0713  GLUCAP 122* 99 87 86 92    Iron Studies: No results for input(s): IRON, TIBC, TRANSFERRIN, FERRITIN in the last 72 hours. Studies/Results: No results found.  Medications: Infusions: . sodium chloride Stopped (05/19/19 0600)  . sodium chloride 75 mL/hr at 05/20/19 0900    Scheduled Medications: . amiodarone  200 mg Oral Daily  . Chlorhexidine Gluconate Cloth  6 each Topical Q0600  . heparin  5,000 Units Subcutaneous Q8H  . insulin aspart  0-15 Units Subcutaneous Q4H  . levothyroxine  75 mcg Per Tube Q0600  . mouth rinse  15 mL Mouth Rinse BID  . metoprolol tartrate  12.5 mg Oral BID     have reviewed scheduled and prn medications.  Physical Exam: General: Not in distress, on high flow oxygen Heart:RRR, s1s2 nl Lungs: Coarse breath sound bilateral, no wheezing Abdomen:soft, Non-tender, distended with marked obesity no abdominal wall edema Extremities: Bilateral lymphedema. Neurology: Alert awake and following simple commands  Jodi Underwood 05/20/2019,9:14 AM  LOS: 6 days  Pager: [phone]

## 2019-05-20 NOTE — Progress Notes (Signed)
CRITICAL VALUE ALERT  Critical Value: Hgb 6.7  Date & Time Notied: 05/20/2019 @ 2320  Provider Notified: Warren Lacy  Orders Received/Actions taken: See orders

## 2019-05-20 NOTE — Progress Notes (Signed)
Pt and daughter refuse CBG at this time.

## 2019-05-20 NOTE — Progress Notes (Signed)
NAME:  Jodi Underwood, MRN:  [id number], DOB:  July 17, 1946, LOS: 6 ADMISSION DATE:  05/13/2019, CONSULTATION DATE:  05/20/19 REFERRING MD: Dr. Roxanne Mins , CHIEF COMPLAINT:  Hypercarbic Respiratory Failure   Brief History   73 year old female with history significant for obesity, OSA, CKD admitted with acute hypercarbic respiratory failure requiring intubation likely r/t CPAP noncompliance c/b decompensated diastolic heart failure. Improving slowly with aggressive diuresis, vent support, HR control.    Significant Hospital Events   12/28 Admit to PCCM  Consults:  Nephro  Procedures:  ETT 10/28 >>11/2  Significant Diagnostic Tests:  10/28 CT head>>no acute intracranial findings 10/28 echo LVEF 60 to [id number], grade 2 diastolic dysfunction, normal RV, normal atria, normal valves  Micro Data:  10/28 Sars-CoV-2>>neg 10/28 resp >> negative 10/28 RVP >> neg [facility] 10/27 >> neg  Trach aspirate 10/31>>  Antimicrobials:  ceftx 10/28 >>10/30 azithro 10/28 >> 10/30  Interim history/subjective:  Extubated yesterday.  Pulled bipap mask off repeatedly during the night.  Awake, no c/o.  Hgb 6.7 overnight - transfused 1 unit    Objective   Blood pressure (!) 145/70, pulse 77, temperature 97.8 F (36.6 C), temperature source Oral, resp. rate 13, height 5\' 7"  (1.702 m), weight (!) 173.8 kg, SpO2 100 %.        Intake/Output Summary (Last 24 hours) at 05/20/2019 0843 Last data filed at 05/20/2019 0700 Gross per 24 hour  Intake 2131.02 ml  Output 1155 ml  Net 976.02 ml   Filed Weights   05/18/19 0500 05/19/19 0405 05/20/19 0500  Weight: (!) 171.6 kg (!) 173.5 kg (!) 173.8 kg    General: obese female, NAD  HEENT: [place]/AT, moist membranes Neuro: awake, alert, follows commands, VERY hard of hearing  CV: RRR, warm and well perfused.   PULM: resps even non labored on [place], diminished bases, few scattered rhonchi  GI: Obese, soft, nontender, nondistended Extremities: Lower extremity lymphedema,  chronic Skin: No rashes  Resolved Hospital Problem list     Assessment & Plan:  Acute hypoxic and hypercarbic respiratory failure requiring mechanical ventilation.   -Suspect this is secondary to volume overload with obesity hypoventilation and noncompliant CPAP use , bicarbonate is high suggesting OHS P: Supplemental O2 as needed  Pulmonary hygiene  Mobilize  qhs CPAP mandatory - needs ongoing discussions regarding importance of this   Acute on chronic heart failure-HFpEF EF 60-65% Hypervolemia.   History of atrial fibrillation HTN -LVEF 60 to 65%, elevated LVEDP, diastolic dysfunction grade II. P: continue amiodarone, metoprolol  Type 2 diabetes, A1c 6.7-  --diet-controlled per daughter.  P: continue Lantus 13 units daily and SSI q 4 hours  Hypothyroidism;  -TSH 9.2 at admission P: Continue home synthroid 75 mcg daily  AKI on Stage III CKD- suspect cardiorenal syndrome.  Baseline Scr ~3-3.5 Renal ultrasound without hydronephrosis.  Hyperkalemia-resolved P: Renal following  Poor candidate for long term dialysis  Holding diuresis for now   Acute encephalopathy -suspect metabolic/delirium.   Azotemia --Improved today P: Supportive care  PT/OT  Continue seroquel   Generalized Deconditioning -- per daughter pt lives with family and is mentally very sharp, but is afraid of falling so rarely gets out of the chair, they have PT coming once per week P: -PT and OT   Prolonged QTc due to meds-resolved -Continue Seroquel; be cautious starting other QTC prolonging meds  Will ask TRH to assume care in am 11/4  Best practice:  Diet: NPO - awaiting swallow eval  Pain/Anxiety/Delirium protocol (if indicated):  VAP protocol (if indicated): Yes  DVT prophylaxis: Heparin and SCD GI prophylaxis: Protonix Glucose control: Sliding scale insulin, Lantus Mobility: Bedrest. Mobilize after extubation Code Status: Full Family Communication: Will call and update daughters  this am.  Disposition: tx tele      Nickolas Madrid, NP 05/20/2019  8:43 AM

## 2019-05-20 NOTE — Progress Notes (Addendum)
Patient c/o "can't breathe". Sats 98-100% on 6L HFNC. Pt appears to be more labored than prior to repositioning. Educated and supported patient in therapeutic breathing. Pt continues to endorse not being able to breathe. HFNC increased to 8LPM. Elink notified. Will continue to monitor.

## 2019-05-20 NOTE — Plan of Care (Signed)
  Problem: Clinical Measurements: Goal: Respiratory complications will improve Outcome: Progressing   Problem: Clinical Measurements: Goal: Ability to maintain clinical measurements within normal limits will improve Outcome: Progressing   

## 2019-05-20 NOTE — Progress Notes (Signed)
Pt safely transferred to 2W03 via room bed. Pt on 6L HFNC with VSS. Pt escorted by RN & NT.

## 2019-05-21 DIAGNOSIS — J9690 Respiratory failure, unspecified, unspecified whether with hypoxia or hypercapnia: Secondary | ICD-10-CM

## 2019-05-21 DIAGNOSIS — H9042 Sensorineural hearing loss, unilateral, left ear, with unrestricted hearing on the contralateral side: Secondary | ICD-10-CM

## 2019-05-21 DIAGNOSIS — R0602 Shortness of breath: Secondary | ICD-10-CM

## 2019-05-21 LAB — CBC
HCT: 27 % — ABNORMAL LOW (ref 36.0–46.0)
Hemoglobin: 7.3 g/dL — ABNORMAL LOW (ref 12.0–15.0)
MCH: 26.8 pg (ref 26.0–34.0)
MCHC: 27 g/dL — ABNORMAL LOW (ref 30.0–36.0)
MCV: 99.3 fL (ref 80.0–100.0)
Platelets: 267 10*3/uL (ref 150–400)
RBC: 2.72 MIL/uL — ABNORMAL LOW (ref 3.87–5.11)
RDW: 14.8 % (ref 11.5–15.5)
WBC: 8.2 10*3/uL (ref 4.0–10.5)
nRBC: 0 % (ref 0.0–0.2)

## 2019-05-21 LAB — GLUCOSE, CAPILLARY
Glucose-Capillary: 97 mg/dL (ref 70–99)
Glucose-Capillary: 96 mg/dL (ref 70–99)
Glucose-Capillary: 105 mg/dL — ABNORMAL HIGH (ref 70–99)
Glucose-Capillary: 113 mg/dL — ABNORMAL HIGH (ref 70–99)
Glucose-Capillary: 121 mg/dL — ABNORMAL HIGH (ref 70–99)
Glucose-Capillary: 134 mg/dL — ABNORMAL HIGH (ref 70–99)

## 2019-05-21 LAB — RENAL FUNCTION PANEL
Albumin: 2.5 g/dL — ABNORMAL LOW (ref 3.5–5.0)
Anion gap: 13 (ref 5–15)
BUN: 134 mg/dL — ABNORMAL HIGH (ref 8–23)
CO2: 28 mmol/L (ref 22–32)
Calcium: 9.2 mg/dL (ref 8.9–10.3)
Chloride: 103 mmol/L (ref 98–111)
Creatinine, Ser: 5.02 mg/dL — ABNORMAL HIGH (ref 0.44–1.00)
GFR calc Af Amer: 9 mL/min — ABNORMAL LOW (ref >60)
GFR calc non Af Amer: 8 mL/min — ABNORMAL LOW (ref >60)
Glucose, Bld: 97 mg/dL (ref 70–99)
Phosphorus: 5.3 mg/dL — ABNORMAL HIGH (ref 2.5–4.6)
Potassium: 4.9 mmol/L (ref 3.5–5.1)
Sodium: 144 mmol/L (ref 135–145)

## 2019-05-21 LAB — BPAM RBC
Blood Product Expiration Date: 202011082359
ISSUE DATE / TIME: 202011030232
Unit Type and Rh: 9500

## 2019-05-21 LAB — IRON AND TIBC
Iron: 40 ug/dL (ref 28–170)
Saturation Ratios: 15 % (ref 10.4–31.8)
TIBC: 269 ug/dL (ref 250–450)
UIBC: 229 ug/dL

## 2019-05-21 LAB — TYPE AND SCREEN
ABO/RH(D): O POS
Antibody Screen: NEGATIVE
Unit division: 0

## 2019-05-21 LAB — FERRITIN: Ferritin: 94 ng/mL (ref 11–307)

## 2019-05-21 MED ORDER — RESOURCE THICKENUP CLEAR PO POWD
ORAL | Status: DC | PRN
  Administered 2019-05-23: 10:00:00 via ORAL
  Filled 2019-05-21: qty 125

## 2019-05-21 MED ORDER — ENSURE MAX PROTEIN PO LIQD
11.0000 [oz_av] | Freq: Two times a day (BID) | ORAL | Status: DC
  Administered 2019-05-22 – 2019-05-31 (×14): 11 [oz_av] via ORAL
  Filled 2019-05-21 (×22): qty 330

## 2019-05-21 MED ORDER — HYDROCODONE-ACETAMINOPHEN 5-325 MG PO TABS
1.0000 | ORAL_TABLET | Freq: Four times a day (QID) | ORAL | Status: DC | PRN
  Administered 2019-05-22 – 2019-05-27 (×6): 1 via ORAL
  Filled 2019-05-21 (×6): qty 1

## 2019-05-21 MED ORDER — POLYETHYLENE GLYCOL 3350 17 G PO PACK: 17.0000 g | PACK | Freq: Every day | ORAL | Status: DC | PRN

## 2019-05-21 MED ORDER — DARBEPOETIN ALFA 100 MCG/0.5ML IJ SOSY
100.0000 ug | PREFILLED_SYRINGE | Freq: Once | INTRAMUSCULAR | Status: AC
  Administered 2019-05-21: 100 ug via SUBCUTANEOUS
  Filled 2019-05-21: qty 0.5

## 2019-05-21 MED ORDER — ACETAMINOPHEN 325 MG PO TABS
650.0000 mg | ORAL_TABLET | Freq: Four times a day (QID) | ORAL | Status: DC | PRN
  Administered 2019-05-23 – 2019-05-27 (×2): 650 mg via ORAL
  Filled 2019-05-21 (×4): qty 2

## 2019-05-21 MED ORDER — SODIUM CHLORIDE 0.9 % IV SOLN
250.0000 mg | Freq: Every day | INTRAVENOUS | Status: AC
  Administered 2019-05-21 – 2019-05-22 (×2): 250 mg via INTRAVENOUS
  Filled 2019-05-21 (×2): qty 20

## 2019-05-21 MED ORDER — ENSURE MAX PROTEIN PO LIQD: 11.0000 [oz_av] | Freq: Every day | ORAL | Status: DC

## 2019-05-21 NOTE — Progress Notes (Signed)
Occupational Therapy Evaluation Patient Details Name: Jodi Underwood MRN: [id number] DOB: [birth year] Today's Date: 05/21/2019    History of Present Illness 73 year old female with past medical history of morbid obesity, OSA, hypertension, type 2 diabetes, atrial fibrillation,  Asthma, CKD stage IV, heart failure, hypothyroidism who presents with 1 week of shortness of breath, cough and chest pressure.  Patient intubated in the ED and admitted to ICU10/28/20. acute hypercarbic respiratory failure likely r/t CPAP noncompliance c/b decompensated diastolic heart failure Extubated 05/19/19.    Clinical Impression   PTA, pt lived at home and required mod A-max A for all BADLs with rolling at bed level, per daughter's report. Pt had previously fallen, and has not gotten OOB in past 8 months due to fear of falling again. Pt currently presents with decreased strength, activity tolerance, ability to follow simple commands, significant edema, and increased pain. Pt requires total A +2/ +3 for all BADLs. Focused session on exercises to decreased edema of BLE; provided education to pt and pt's daughter on exercises to reduce edema. Additionally worked to transfer pt to working bariatric bed using maxi-slide pads. Pt requires OT acutely to address deficits and facilitate safe dc. Recommend dc to SNF to increase safe performance of ADLs.    Follow Up Recommendations  SNF;Supervision/Assistance - 24 hour    Equipment Recommendations  Other (comment)(defer to next venue)    Recommendations for Other Services PT consult     Precautions / Restrictions Precautions Precautions: Fall Precaution Comments: family reports multiple falls, causing fear of movement Restrictions Weight Bearing Restrictions: No      Mobility Bed Mobility Overal bed mobility: Needs Assistance Bed Mobility: Rolling Rolling: Total assist;+2 for physical assistance;+2 for safety/equipment         General bed mobility  comments: Pt reached for therapist hand, however was unable to maintain grasp to assist with rolling. required total A+2/ +3 for rolling in bed  Transfers Overall transfer level: Needs assistance Equipment used: (Maxi-slide pads) Transfers: (lateral slide transfer)           General transfer comment: Pt was transferred with maxi-slide pads from one bariatric bed to another; required total A with assist of 6 staff.     Balance                                           ADL either performed or assessed with clinical judgement   ADL Overall ADL's : Needs assistance/impaired                                       General ADL Comments: Pt requires total A +2/ +3 for all BADLs. Unable to maintain grasp with hand to assist with rolling in bed. Focused session on leg and arm exercises to pump fluid through body to decrease edema. Upon arrival, pt was found in bariatric bed that control would not work, and pt was stuck in trendelenburg position. With assist of PT and 3 additional nursing staff, pt was transfered using maxi-slide pads to working bariatric bed.      Vision         Perception     Praxis      Pertinent Vitals/Pain Pain Assessment: Faces Faces Pain Scale: Hurts even more Pain Location: L knee, L shoulder Pain Descriptors /  Indicators: Aching;Discomfort;Grimacing;Moaning;Sore Pain Intervention(s): Monitored during session;Limited activity within patient's tolerance     Hand Dominance Right   Extremity/Trunk Assessment Upper Extremity Assessment Upper Extremity Assessment: Generalized weakness   Lower Extremity Assessment Lower Extremity Assessment: Defer to PT evaluation   Cervical / Trunk Assessment Cervical / Trunk Assessment: Other exceptions Cervical / Trunk Exceptions: significant body habitus   Communication Communication Communication: HOH   Cognition Arousal/Alertness: Awake/alert Behavior During Therapy:  Anxious;Flat affect Overall Cognitive Status: Impaired/Different from baseline Area of Impairment: Attention;Following commands;Memory;Safety/judgement;Awareness;Problem solving;Orientation                 Orientation Level: Disoriented to;Situation Current Attention Level: Focused Memory: Decreased short-term memory Following Commands: Follows one step commands inconsistently;Follows one step commands with increased time Safety/Judgement: Decreased awareness of safety;Decreased awareness of deficits Awareness: Intellectual Problem Solving: Slow processing;Decreased initiation;Difficulty sequencing;Requires verbal cues;Requires tactile cues General Comments: Pt presented disoriented during session, and continually asked "what happened". pt very HOH, requires speaking slowly very close to her for her to understand. Pt demonstrated difficulty following simple commands, but was able to follow some with increased time and verbal/visual cues.    General Comments  Significant BLE edema. provided education to pt's daughter about importance of movement to decrease swelling    Exercises Exercises: Other exercises;General Upper Extremity;General Lower Extremity General Exercises - Upper Extremity Shoulder Flexion: AAROM;Both;Supine;10 reps Shoulder Extension: AAROM;Both;10 reps;Supine Digit Composite Flexion: AROM;Both;10 reps;Supine Composite Extension: AROM;Both;10 reps;Supine General Exercises - Lower Extremity Ankle Circles/Pumps: AAROM;Both;5 reps;Supine Heel Slides: AAROM;Both;10 reps;Supine   Shoulder Instructions      Home Living Family/patient expects to be discharged to:: Private residence Living Arrangements: Children Available Help at Discharge: Family;Available PRN/intermittently Type of Home: House Home Access: Level entry     Home Layout: One level     Bathroom Shower/Tub: Tub/shower unit;Other (comment)   Bathroom Toilet: Standard Bathroom Accessibility: Yes    Home Equipment: Walker - 2 wheels;Bedside commode;Wheelchair - power;Wheelchair - manual;Hospital bed          Prior Functioning/Environment Level of Independence: Needs assistance  Gait / Transfers Assistance Needed: per pt's daughter, pt has been bed ridden for past 8 months. Refuses to get OOB due to fear of falling ADL's / Homemaking Assistance Needed: able to roll in bed, but requires max-total A for all ADLs Communication / Swallowing Assistance Needed: extremely hard of hearing Comments: Pt's daughter in the room, requires mod A-max A for all BADLs by rolling at bed level, and has not been OOB in past 8 months due to fear of falling. Pt's daughter reports that pt does have PCA to assist with BADLs        OT Problem List: Decreased strength;Decreased range of motion;Decreased activity tolerance;Decreased cognition;Decreased safety awareness;Decreased knowledge of use of DME or AE;Decreased knowledge of precautions;Cardiopulmonary status limiting activity;Obesity;Pain;Increased edema      OT Treatment/Interventions: Self-care/ADL training;DME and/or AE instruction;Therapeutic activities;Cognitive remediation/compensation;Patient/family education;Balance training;Therapeutic exercise    OT Goals(Current goals can be found in the care plan section) Acute Rehab OT Goals Patient Stated Goal: go back to sleep OT Goal Formulation: With patient Time For Goal Achievement: 06/04/19 Potential to Achieve Goals: Good ADL Goals Pt Will Perform Grooming: with mod assist;sitting Pt Will Perform Upper Body Dressing: with mod assist;sitting Pt Will Perform Lower Body Dressing: with max assist;sit to/from stand Pt Will Transfer to Toilet: with max assist;squat pivot transfer;with +2 assist;bedside commode Pt Will Perform Toileting - Clothing Manipulation and hygiene: with max assist;sitting/lateral leans  OT Frequency:  Min 2X/week   Barriers to D/C:            Co-evaluation PT/OT/SLP  Co-Evaluation/Treatment: Yes Reason for Co-Treatment: For patient/therapist safety;To address functional/ADL transfers PT goals addressed during session: Mobility/safety with mobility OT goals addressed during session: ADL's and self-care      AM-PAC OT "6 Clicks" Daily Activity     Outcome Measure Help from another person eating meals?: Total Help from another person taking care of personal grooming?: Total Help from another person toileting, which includes using toliet, bedpan, or urinal?: Total Help from another person bathing (including washing, rinsing, drying)?: Total Help from another person to put on and taking off regular upper body clothing?: Total Help from another person to put on and taking off regular lower body clothing?: Total 6 Click Score: 6   End of Session Nurse Communication: Mobility status  Activity Tolerance: Patient tolerated treatment well;Patient limited by pain;Other (comment)(Pt requesting to "go back to sleep") Patient left: in bed;with call bell/phone within reach;with family/visitor present  OT Visit Diagnosis: Pain;Cognitive communication deficit (R41.841);Muscle weakness (generalized) (M62.81);Repeated falls (R29.6);History of falling (Z91.81);Other symptoms and signs involving cognitive function Pain - Right/Left: (both) Pain - part of body: Shoulder;Knee                Time: [id number] OT Time Calculation (min): 59 min Charges:  OT General Charges $OT Visit: 1 Visit OT Evaluation $OT Eval Moderate Complexity: 1 Mod OT Treatments $Self Care/Home Management : 8-22 mins  Gus Rankin, OT Student  Gus Rankin 05/21/2019, 3:57 PM

## 2019-05-21 NOTE — Progress Notes (Signed)
  Speech Language Pathology Treatment: Dysphagia  Patient Details Name: Jodi Underwood MRN: [id number] DOB: 1946-03-18 Today's Date: 05/21/2019 Time: [id number] SLP Time Calculation (min) (ACUTE ONLY): 18 min  Assessment / Plan / Recommendation Clinical Impression  Pt demonstrates improvement in airway protection since eval yesterday. Secretion management, vocal quality and arousal have improved. Pt passed 3 oz water swallow. Pt does report severe pain with swallow, redness barely seen on the uvula. Pt indicates her ears and upper throat hurt. She does clear her throat often with PO intake and reports that the water hurts more, possibly due to temperature.  She says she was having some pian with swallowing prior to admission.  Pt and daughter also state that left sided hearing impairment is new. Will attempt a conservative diet today dys 2/nectar and f/u for tolerance. RN reports she requested an ENT consult which may yield some answers about pts complaint.   HPI HPI: 73 year old woman with obesity hypoventilation syndrome. Critically ill due to hypercarbic respiratory failure likely secondary to noncompliance with CPAP at home. Intubated from 10/28 to 11/2       SLP Plan  Continue with current plan of care       Recommendations  Diet recommendations: Dysphagia 2 (fine chop);Nectar-thick liquid Liquids provided via: Cup;Straw Medication Administration: Whole meds with puree Supervision: Staff to assist with self feeding;Trained caregiver to feed patient Compensations: Slow rate;Small sips/bites Postural Changes and/or Swallow Maneuvers: Seated upright 90 degrees                Oral Care Recommendations: Oral care BID Follow up Recommendations: Skilled Nursing facility SLP Visit Diagnosis: Dysphagia, unspecified (R13.10) Plan: Continue with current plan of care       GO               Herbie Baltimore, MA [place] Pager [phone] Office  [phone]  Jodi Underwood 05/21/2019, 3:26 PM

## 2019-05-21 NOTE — Progress Notes (Signed)
Pt refusing CPAP. Pt placed on HFNC at [facility]. Pt instructed on importance of CPAP during sleep hours. Pt ensures the writer that she is now awake and will stay awake and watch TV.

## 2019-05-21 NOTE — Progress Notes (Signed)
[facility] NEPHROLOGY PROGRESS NOTE  Assessment/ Plan: Pt is a 73 y.o. yo female with history of obesity, OSA, CKD stage IV admitted with respiratory failure, consulted for AKI on CKD.  #AKI on CKD stage IV: Baseline creatinine level around 3-3.5.  AKI likely hemodynamically mediated.  UA likely contaminant.  Kidney US with no obstruction, right kidney small.  Attempted IV fluid with no significant renal improvement. -Urine output is marginal today associated with worsening BUN and creatinine level.  I have discussed with the patient and her daughter at length today about worsening kidney function.  As per patient's daughter, the patient will receive dialysis if needed and family will support her.  I am not sure if she can tolerate outpatient HD. -Encourage oral intake.  I will repeat lab in the morning.  Keep n.p.o. if she needs access for dialysis.  Monitor BMP, urine output.  Avoid nephrotoxins.  #Ventilator dependent respiratory failure, now extubated nasal cannula.  Per pulmonary team.    #CHF, EF 60 to 65%: Initially diuresed with IV Lasix.  Holding further IV fluid.  #Anemia: Received blood transfusion.  Iron saturation 15%.  I will start iron and ESA.   #Acute encephalopathy, metabolic: Minimize opiates or sedatives.  Patient was alert awake and following commands.  #Hyperphosphatemia: Check PTH level.  Monitor lab.  #General deconditioning.  Discussed with the primary team.  Subjective: Seen and examined at bedside.  Alert awake and following commands.  Denied chest pain, shortness of breath, nausea vomiting.  Feels weak.  Spoke with patient's daughter over the phone.  Objective Vital signs in last 24 hours: Vitals:   05/20/19 2059 05/21/19 0528 05/21/19 0741 05/21/19 0822  BP: (!) 169/71  (!) 191/67   Pulse: 74  73 72  Resp: 20   20  Temp: 98.1 F (36.7 C)  98.2 F (36.8 C)   TempSrc: Oral  Oral   SpO2: 99%  98% 98%  Weight:  (!) 179.2 kg    Height:        Weight change: 5 kg  Intake/Output Summary (Last 24 hours) at 05/21/2019 1142 Last data filed at 05/20/2019 1300 Gross per 24 hour  Intake -  Output 75 ml  Net -75 ml       Labs: Basic Metabolic Panel: Recent Labs  Lab 05/19/19 0705  05/19/19 2230 05/20/19 0742 05/21/19 0308  NA 140   < > 144 143 144  K 4.4   < > 4.7 4.9 4.9  CL 100  --  101 103 103  CO2 29  --  30 29 28   GLUCOSE 169*  --  94 100* 97  BUN 127*  --  127* 127* 134*  CREATININE 4.62*  --  4.82* 4.77* 5.02*  CALCIUM 8.4*  --  9.0 8.9 9.2  PHOS 3.3  --   --  4.8* 5.3*   < > = values in this interval not displayed.   Liver Function Tests: Recent Labs  Lab 05/17/19 0820 05/19/19 0705 05/20/19 0742 05/21/19 0308  AST 66*  --   --   --   ALT 30  --   --   --   ALKPHOS 109  --   --   --   BILITOT 0.4  --   --   --   PROT 6.2*  --   --   --   ALBUMIN 2.4* 2.4* 2.6* 2.5*   No results for input(s): LIPASE, AMYLASE in the last 168 hours. No  results for input(s): AMMONIA in the last 168 hours. CBC: Recent Labs  Lab 05/16/19 0226  05/17/19 0820 05/18/19 0338  05/19/19 2230 05/20/19 0742 05/21/19 0308  WBC 6.9  --  6.3 8.4  --  10.3  --  8.2  HGB 7.2*   < > 7.1* 7.0*   < > 6.7* 7.4* 7.3*  HCT 24.8*   < > 24.5* 24.8*   < > 25.1* 27.4* 27.0*  MCV 91.9  --  95.3 95.0  --  100.0  --  99.3  PLT 220  --  212 193  --  249  --  267   < > = values in this interval not displayed.   Cardiac Enzymes: No results for input(s): CKTOTAL, CKMB, CKMBINDEX, TROPONINI in the last 168 hours. CBG: Recent Labs  Lab 05/20/19 1105 05/20/19 1613 05/20/19 2327 05/21/19 0321 05/21/19 0740  GLUCAP 99 98 94 97 96    Iron Studies:  Recent Labs    05/21/19 0308  IRON 40  TIBC 269  FERRITIN 94   Studies/Results: No results found.  Medications: Infusions: . sodium chloride Stopped (05/19/19 0600)    Scheduled Medications: . amiodarone  200 mg Oral Daily  . bethanechol  5 mg Oral TID  . Chlorhexidine  Gluconate Cloth  6 each Topical Q0600  . heparin  5,000 Units Subcutaneous Q8H  . insulin aspart  0-15 Units Subcutaneous Q4H  . levothyroxine  75 mcg Per Tube Q0600  . mouth rinse  15 mL Mouth Rinse BID  . metoprolol tartrate  12.5 mg Oral BID    have reviewed scheduled and prn medications.  Physical Exam: General: Not in distress, lying on bed Heart:RRR, s1s2 nl Lungs: Decreased breath sound bilateral, no wheezing Abdomen:soft, Non-tender, distended with marked obesity no abdominal wall edema Extremities: Bilateral lymphedema. Neurology: Alert awake and following simple commands   [name] 05/21/2019,11:42 AM  LOS: 7 days  Pager: [phone]

## 2019-05-21 NOTE — Progress Notes (Signed)
Spoke with MD related to patient's daughter and request. Coordinated information between MD and daughter/ Provided MD with daughters number for continuation of care.

## 2019-05-21 NOTE — Consult Note (Signed)
Reason for Consult: Hearing loss  HPI:  Jodi Underwood is an 73 y.o. female who was admitted due to hypercarbic respiratory failure due to noncompliance with CPAP. She has a history of obesity, OSA, CKD stage IV. Patient and her daughter have complained that she is having left ear hearing difficulty, which is new. Pt is a poor historian and could not answer questions regarding the nature of her hearing loss. No family member at bedside.  Past Medical History:  Diagnosis Date  . Anemia   . Arthritis   . Asthma   . Atrial fibrillation ([place])   . CKD (chronic kidney disease)   . Degenerative disc disease   . Diabetes mellitus   . Diabetic neuropathy ([place])   . Edema extremities   . Hypertension   . Lymphedema   . Morbid obesity ([place])   . OSA (obstructive sleep apnea)   . Pneumonia   . Urinary incontinence     Past Surgical History:  Procedure Laterality Date  . ABDOMINAL HYSTERECTOMY     COMPLETE    Family History  Adopted: Yes  Family history unknown: Yes    Social History:  reports that she has never smoked. She has never used smokeless tobacco. She reports current alcohol use. She reports that she does not use drugs.  Allergies:  Allergies  Allergen Reactions  . Ciprofloxacin Other (See Comments)    Hallucinations  . Warfarin And Related Other (See Comments)    "Did not work well with her," per daughter    Prior to Admission medications   Medication Sig Start Date End Date Taking? Authorizing Provider  albuterol (VENTOLIN HFA) 108 (90 Base) MCG/ACT inhaler Inhale 2 puffs into the lungs 2 (two) times daily as needed for wheezing or shortness of breath.  12/02/18  Yes [provider]  amiodarone (PACERONE) 200 MG tablet Take 200 mg by mouth daily after breakfast.  08/25/17  Yes [provider]  bumetanide (BUMEX) 2 MG tablet Take 2 mg by mouth daily after breakfast.  11/09/17  Yes [provider]  cyclobenzaprine (FLEXERIL) 10 MG tablet Take  10 mg by mouth every 6 (six) hours as needed for muscle spasms.   Yes [provider]  diphenhydramine-acetaminophen (TYLENOL PM) 25-500 MG TABS tablet Take 1 tablet by mouth at bedtime as needed (for sleep).   Yes [provider]  Hydrocortisone (GERHARDT'S BUTT CREAM) CREA Apply 1 application topically 2 (two) times daily. 02/05/18  Yes Georgette Shell, MD  Insulin NPH, Human,, Isophane, (NOVOLIN N FLEXPEN RELION) 100 UNIT/ML Kiwkpen Inject 50 Units into the skin 2 (two) times daily.    Yes [provider]  levothyroxine (EUTHYROX) 25 MCG tablet Take 12.5 mcg by mouth daily before breakfast.    Yes [provider]  levothyroxine (EUTHYROX) 50 MCG tablet Take 50 mcg by mouth daily before breakfast.   Yes [provider]  naproxen sodium (ALEVE) 220 MG tablet Take 220-440 mg by mouth 2 (two) times daily as needed (for pain or headaches).   Yes [provider]  potassium chloride (K-DUR) 10 MEQ tablet Take 10 mEq by mouth 3 (three) times daily with meals.  01/25/18  Yes [provider]  acetaminophen (TYLENOL) 325 MG tablet Take 325-650 mg by mouth every 6 (six) hours as needed (For pain.).     [provider]  cholestyramine light (PREVALITE) 4 g packet Take 1 packet (4 g total) by mouth 3 (three) times daily. 02/05/18   Landis Gandy  G, MD  fluticasone (FLONASE) 50 MCG/ACT nasal spray Place 2 sprays into both nostrils daily. Patient not taking: Reported on 05/15/2019 10/20/16   Patrecia Pour, Christean Grief, MD  guaiFENesin (MUCINEX) 600 MG 12 hr tablet Take 600 mg by mouth 2 (two) times daily as needed for to loosen phlegm.     [provider]  metoprolol tartrate (LOPRESSOR) 25 MG tablet Take 0.5 tablets (12.5 mg total) by mouth 2 (two) times daily. 12/28/17   Alma Friendly, MD  montelukast (SINGULAIR) 10 MG tablet Take 10 mg by mouth daily. 08/24/17   [provider]  nystatin cream (MYCOSTATIN) Apply 1  application topically 2 (two) times daily.    [provider]  promethazine (PHENERGAN) 25 MG tablet Take 25 mg by mouth every 6 (six) hours as needed for nausea or vomiting.    [provider]  saccharomyces boulardii (FLORASTOR) 250 MG capsule Take 1 capsule (250 mg total) by mouth 3 (three) times daily. 02/05/18   Georgette Shell, MD    Medications:  I have reviewed the patient's current medications. Scheduled: . amiodarone  200 mg Oral Daily  . bethanechol  5 mg Oral TID  . Chlorhexidine Gluconate Cloth  6 each Topical Q0600  . heparin  5,000 Units Subcutaneous Q8H  . insulin aspart  0-15 Units Subcutaneous Q4H  . levothyroxine  75 mcg Per Tube Q0600  . mouth rinse  15 mL Mouth Rinse BID  . metoprolol tartrate  12.5 mg Oral BID  . Ensure Max Protein  11 oz Oral BID   Continuous: . sodium chloride Stopped (05/19/19 0600)  . ferric gluconate (FERRLECIT/NULECIT) IV 250 mg (05/21/19 1334)   [id number] chloride, acetaminophen, albuterol, HYDROcodone-acetaminophen, labetalol, polyethylene glycol, Resource ThickenUp Clear  Review of systems: Unable to perform due to the patient's condition.  Blood pressure (!) 166/62, pulse 71, temperature 98 F (36.7 C), temperature source Oral, resp. rate 18, height 5\' 6"  (1.676 m), weight (!) 179.2 kg, SpO2 100 %. Physical Exam: General: No distress, lying on bed Eyes: Pupils are equal, round, reactive to light. Extraocular motion is intact.  Ears: Examination of the ears shows normal auricles bilaterally. Cerumen is noted in her ear canal. No drainage. Nose: Nasal examination shows normal mucosa, septum, turbinates.  Face: Facial examination shows no asymmetry. Palpation of the face elicit no significant tenderness.  Mouth: Oral cavity examination shows no mucosal lacerations. No significant trismus is noted.  Neck Palpation of the neck reveals no lymphadenopathy or mass. The trachea is midline.  Heart:RRR, s1s2 nl Lungs:  Decreased breath sound bilateral, no wheezing Abdomen:soft, Non-tender, distended with marked obesity no abdominal wall edema Extremities: Bilateral lymphedema. Neurology: Alert awake and following simple commands  Assessment/Plan: Subjective left ear hearing loss. - No acute infection is noted today. - Recommend follow up in my office for cerumen removal and formal hearing test. - No acute intervention at this time.  Caisen Mangas W Aziah Brostrom 05/21/2019, 10:23 PM

## 2019-05-21 NOTE — Progress Notes (Addendum)
Nutrition Follow-up  DOCUMENTATION CODES:   Morbid obesity  INTERVENTION:   -Will continue to monitor for diet advancement   -If unable to have diet advanced, recommend placement of feeding tube. [facility] service next available Friday 11/6.  **Consult received today, pt's diet now advanced to bariatric full liquids as of 1330.   -Will order Ensure MAX Protein po BID, each supplement provides 150 kcal and 30 grams of protein  NUTRITION DIAGNOSIS:   Inadequate oral intake related to inability to eat as evidenced by NPO status.  Ongoing.  GOAL:   Patient will meet greater than or equal to 90% of their needs  Not meeting.  MONITOR:   Diet advancement, Labs, Weight trends, I & O's, Skin  ASSESSMENT:   73 yo female admitted with SOB and worsening LE edema, suspected CHF exacerbation, required intubation. PMH includes asthma, DM, HTN, CHF, neuropathy, lymphedema, A fib, CKD, morbid obesity.  10/27: admitted 10/28-11/2: intubated  **RD working remotely**  Patient extubated 11/2. TF was stopped at that time.  Patient remains NPO per SLP recommendation on 11/3 given pt is still showing signs of aspiration with large volume intakes. Pt with weak cough. Pt may need FEES if not improved.   Nephrology following for CKD-4, per note on 11/3, pt poor candidate for dialysis. Pt may need tube feeding. Will monitor for plan going forward.   Admission weight: 400 lbs. Current weight: 395 lbs. Per nursing documentation, pt with moderate LE edema.   I/Os: +9.3L since admit UOP: 380 ml x 24 hrs  Medications reviewed. Labs reviewed: CBGs: 96-97 Elevated Phos, Creatinine  Diet Order:   Diet Order            Diet NPO time specified Except for: Ice Chips, Other (See Comments)  Diet effective now              EDUCATION NEEDS:   Not appropriate for education at this time  Skin:  Skin Assessment: Skin Integrity Issues: Skin Integrity Issues:: DTI DTI: R buttocks  Last BM:   11/3  Height:   Ht Readings from Last 1 Encounters:  05/20/19 5\' 6"  (1.676 m)    Weight:   Wt Readings from Last 1 Encounters:  05/21/19 (!) 179.2 kg    Ideal Body Weight:  61.4 kg  BMI:  Body mass index is 63.77 kg/m.  Estimated Nutritional Needs:   Kcal:  2050-2250  Protein:  80-90g  Fluid:  2L/day  Clayton Bibles, MS, RD, LDN Inpatient Clinical Dietitian Pager: [phone] After Hours Pager: [phone]

## 2019-05-21 NOTE — Progress Notes (Signed)
Obtained a bariatric bed for pt who is refusing at this time to be transferred to the bariatric bed.

## 2019-05-21 NOTE — Care Management Important Message (Signed)
Important Message  Patient Details  Name: Jodi Underwood MRN: [id number] Date of Birth: 1946-04-08   Medicare Important Message Given:  Yes     Sanam Marmo 05/21/2019, 3:00 PM

## 2019-05-21 NOTE — Progress Notes (Signed)
PROGRESS NOTE    Patient: Jodi Underwood                            PCP: Jodi Marble, MD                    DOB: [birth year]            DOA: 05/13/2019 [id number]             DOS: 05/21/2019, 11:27 AM   LOS: 7 days   Date of Service: The patient was seen and examined on 05/21/2019  Subjective:   The patient was seen examined this morning, remained stable no acute distress, on O2 via nasal cannula. Complaining of leg pain.  Hard of hearing. No significant issues overnight. Received 1 unit of packed red blood cell transfusion yesterday her hemoglobin improved from 6.7-7.3 No active signs of bleeding noted Worsening kidney function with a BUN of 134/creatinine of 5.02   Daughter arrived from out of town, patient is currently plan of care was discussed with the daughter in detail. She is unhappy as communication has not been good in the past few days. Stating that her mother would probably comply if she would hear better.   Requesting ENT consultation Requesting to speak to nephrology regarding progressive renal failure.  Brief Narrative:   73 year old female with history significant for obesity, OSA, CKD admitted with acute hypercarbic respiratory failure requiring intubation likely r/t CPAP noncompliance c/b decompensated diastolic heart failure. Improving slowly with aggressive diuresis, vent support, HR control.   12/28 Admit to PCCM Intubated 05/14/2019 >>>> extubated 05/19/2019 05/20/2019 - Pulled bipap mask off repeatedly during the night.   Hgb 6.7 overnight - transfused 1 unit    Assessment & Plan:   Active Problems:   Respiratory failure (HCC)   Acute respiratory failure with hypoxia and hypercarbia (HCC)   Chest pain   Renal insufficiency   Shortness of breath   Decubitus ulcer of coccygeal region, unspecified pressure ulcer stage   Acute hypoxic and hypercarbic respiratory failure requiring mechanical ventilation.   -Suspect this is secondary to  volume overload with obesity hypoventilation and noncompliant CPAP use , bicarbonate is high suggesting OHS  Supplemental O2 as needed  Pulmonary hygiene  Mobilize -with aggressive PT OT qhs CPAP mandatory - needs ongoing discussions regarding importance of this  -She remains noncompliant  Acute on chronic heart failure-HFpEF EF 60-65%/A. fib/   -LVEF 60 to 65%, elevated LVEDP, diastolic dysfunction grade II. continue amiodarone, metoprolol  Hypertension -would recent hypovolemia/hypotension -Monitoring closely, -Currently on metoprolol -Remained stable   Type 2 diabetes, A1c 6.7-  --diet-controlled per daughter.  continue Lantus 13 units daily  -Checking blood sugar QA CHS with SSI  Hypothyroidism;  -TSH 9.2 at admission Continue home synthroid 75 mcg daily -Remained stable  AKI on Stage III CKD- suspect cardiorenal syndrome.  Baseline Scr ~3-3.5 Renal ultrasound without hydronephrosis.  Hyperkalemia-resolved -Worsening kidney function BUN of 134/creatinine of 5.02 Renal following  Poor candidate for long term dialysis  Holding diuresis for now   Acute encephalopathy -suspect metabolic/delirium.   Azotemia -Mentation continues to improve, alert and oriented x2 today Continue supportive care, physical therapy Continue seroquel   Generalized Deconditioning -- per daughter pt lives with family and is mentally very sharp, but is afraid of falling so rarely gets out of the chair, they have PT coming once per week -Pending PT/OT evaluation and recommendation  Prolonged  QTc due to meds-resolved -Continue Seroquel; be cautious starting other QTC prolonging meds -Monitoring closely       Cultures;  Antimicrobials:  DVT prophylaxis: SCD/Compression stockings Code Status:   Code Status: Full Code Family Communication:  Patient's daughter was updated plan of care was discussed in detail her cell phone [phone]  Disposition Plan:   Anticipated 1-2 days  Admission status:  NPATIEN -   Consultants:  PCCM/Nephrology/ENT   Significant Diagnostic Tests:  10/28 CT head>>no acute intracranial findings 10/28 echo LVEF 60 to [id number], grade 2 diastolic dysfunction, normal RV, normal atria, normal valves  Micro Data:  10/28 Sars-CoV-2>>neg 10/28 resp >> negative 10/28 RVP >> neg [facility] 10/27 >> neg  Trach aspirate 10/31>>  Antimicrobials:  ceftx 10/28 >>10/30 azithro 10/28 >> 10/30    Procedures:   Intubated 05/14/2019 >>>> extubated 05/19/2019  Antimicrobials:  Anti-infectives (From admission, onward)   Start     Dose/Rate Route Frequency Ordered Stop   05/14/19 0900  azithromycin (ZITHROMAX) 500 mg in sodium chloride 0.9 % 250 mL IVPB     500 mg 250 mL/hr over 60 Minutes Intravenous Daily 05/14/19 0830 05/16/19 1104   05/14/19 0500  azithromycin (ZITHROMAX) 500 mg in sodium chloride 0.9 % 250 mL IVPB  Status:  Discontinued     500 mg 250 mL/hr over 60 Minutes Intravenous Daily 05/14/19 0331 05/14/19 0830   05/14/19 0330  cefTRIAXone (ROCEPHIN) 2 g in sodium chloride 0.9 % 100 mL IVPB  Status:  Discontinued     2 g 200 mL/hr over 30 Minutes Intravenous Daily at bedtime 05/14/19 0310 05/16/19 0908       Medication:  . amiodarone  200 mg Oral Daily  . bethanechol  5 mg Oral TID  . Chlorhexidine Gluconate Cloth  6 each Topical Q0600  . heparin  5,000 Units Subcutaneous Q8H  . insulin aspart  0-15 Units Subcutaneous Q4H  . levothyroxine  75 mcg Per Tube Q0600  . mouth rinse  15 mL Mouth Rinse BID  . metoprolol tartrate  12.5 mg Oral BID    sodium chloride, acetaminophen, albuterol, HYDROcodone-acetaminophen, labetalol   Objective:   Vitals:   05/20/19 2059 05/21/19 0528 05/21/19 0741 05/21/19 0822  BP: (!) 169/71  (!) 191/67   Pulse: 74  73 72  Resp: 20   20  Temp: 98.1 F (36.7 C)  98.2 F (36.8 C)   TempSrc: Oral  Oral   SpO2: 99%  98% 98%  Weight:  (!) 179.2 kg    Height:        Intake/Output Summary (Last 24  hours) at 05/21/2019 1127 Last data filed at 05/20/2019 1300 Gross per 24 hour  Intake -  Output 75 ml  Net -75 ml   Filed Weights   05/20/19 0500 05/20/19 1315 05/21/19 0528  Weight: (!) 173.8 kg (!) 178.8 kg (!) 179.2 kg     Examination:   Physical Exam  Constitution:  Alert, cooperative, no distress,  Appears calm and comfortable  Psychiatric: Normal and stable mood and affect, cognition intact,   HEENT: Normocephalic, PERRL, otherwise with in Normal limits  Chest:Chest symmetric Cardio vascular:  S1/S2, RRR, No murmure, No Rubs or Gallops  pulmonary: Clear to auscultation bilaterally, respirations unlabored, negative wheezes / crackles Abdomen: Soft, non-tender, non-distended, bowel sounds,no masses, no organomegaly Muscular skeletal: Limited exam - in bed, able to move all 4 extremities, Normal strength,  Neuro: CNII-XII intact. , normal motor and sensation, reflexes intact  Extremities:  +++3 pitting  edema lower extremities, +2 pulses  Skin: Dry, warm to touch, negative for any Rashes, No open wounds Wounds: per nursing documentation  LABs:  CBC Latest Ref Rng & Units 05/21/2019 05/20/2019 05/19/2019  WBC 4.0 - 10.5 K/uL 8.2 - 10.3  Hemoglobin 12.0 - 15.0 g/dL 7.3(L) 7.4(L) 6.7(LL)  Hematocrit 36.0 - 46.0 % 27.0(L) 27.4(L) 25.1(L)  Platelets 150 - 400 K/uL 267 - 249   CMP Latest Ref Rng & Units 05/21/2019 05/20/2019 05/19/2019  Glucose 70 - 99 mg/dL 97 100(H) 94  BUN 8 - 23 mg/dL 134(H) 127(H) 127(H)  Creatinine 0.44 - 1.00 mg/dL 5.02(H) 4.77(H) 4.82(H)  Sodium 135 - 145 mmol/L 144 143 144  Potassium 3.5 - 5.1 mmol/L 4.9 4.9 4.7  Chloride 98 - 111 mmol/L 103 103 101  CO2 22 - 32 mmol/L 28 29 30   Calcium 8.9 - 10.3 mg/dL 9.2 8.9 9.0  Total Protein 6.5 - 8.1 g/dL - - -  Total Bilirubin 0.3 - 1.2 mg/dL - - -  Alkaline Phos 38 - 126 U/L - - -  AST 15 - 41 U/L - - -  ALT 0 - 44 U/L - - -        SIGNED: Deatra James, MD, FACP, FHM. [facility],  Pager  [phone]  If 7PM-7AM, please contact night-coverage [email or web address] Ota [facility] 05/21/2019, 11:27 AM

## 2019-05-21 NOTE — Progress Notes (Signed)
Pt remained on CPAP for about 5.5 hours during the night, refused transfer to bariatric bed, and refused wound assessment and care.

## 2019-05-21 NOTE — Progress Notes (Signed)
Pt daughter [name] arrived and is here with patient and had some questions for [id number] like to speak about pt status, possibility of ace wraps to ble-alternating between l side and r side, an ENT consult for possible worsening of hearing (she has one she prefers). This nurse spoke with patients daughter regarding the reported refusal by pt to be transferred to alternate ordered bed (bariactric style) and encouragement of patient to be in a bed where more room for turning and repositioning is possible. However, if patient remains to refuse bed that we will request ot to be returned.

## 2019-05-21 NOTE — Progress Notes (Signed)
Physical Therapy Treatment Patient Details Name: Jodi Underwood MRN: [id number] DOB: 01/10/1946 Today's Date: 05/21/2019    History of Present Illness 73 year old female with past medical history of morbid obesity, OSA, hypertension, type 2 diabetes, atrial fibrillation,  Asthma, CKD stage IV, heart failure, hypothyroidism who presents with 1 week of shortness of breath, cough and chest pressure.  Patient intubated in the ED and admitted to ICU10/28/20. acute hypercarbic respiratory failure likely r/t CPAP noncompliance c/b decompensated diastolic heart failure Extubated 05/19/19.     PT Comments    Pt on malfunctioning air bed on entry, needing transfer to operable bed. Pt limited in session by pain and decreased strength and mobility. Pt total Ax2 for rolling for placement of maxi-slide for transfer to new bed. Pt requires assist of 6 for safe lateral supine transfer to bed. D/c plans remain appropriate at this time. PT will continue to follow acutely.  Follow Up Recommendations  SNF     Equipment Recommendations  Other (comment)(TBD at next venue)       Precautions / Restrictions Precautions Precautions: Fall Precaution Comments: family reports multiple falls, causing fear of movement Restrictions Weight Bearing Restrictions: No    Mobility  Bed Mobility Overal bed mobility: Needs Assistance Bed Mobility: Rolling Rolling: Total assist;+2 for physical assistance;+2 for safety/equipment         General bed mobility comments: Pt reached for therapist hand, however was unable to maintain grasp to assist with rolling. required total A+2/ +3 for rolling in bed  Transfers Overall transfer level: Needs assistance Equipment used: (Maxi-slide pads) Transfers: (lateral slide transfer)           General transfer comment: Pt was transferred with maxi-slide pads from one bariatric bed to another; required total A with assist of 6 staff.            Cognition  Arousal/Alertness: Awake/alert Behavior During Therapy: Anxious;Flat affect Overall Cognitive Status: Impaired/Different from baseline Area of Impairment: Attention;Following commands;Memory;Safety/judgement;Awareness;Problem solving;Orientation                 Orientation Level: Disoriented to;Situation Current Attention Level: Focused Memory: Decreased short-term memory Following Commands: Follows one step commands inconsistently;Follows one step commands with increased time Safety/Judgement: Decreased awareness of safety;Decreased awareness of deficits Awareness: Intellectual Problem Solving: Slow processing;Decreased initiation;Difficulty sequencing;Requires verbal cues;Requires tactile cues General Comments: Pt presented disoriented during session, and continually asked "what happened". pt very HOH, requires speaking slowly very close to her for her to understand. Pt demonstrated difficulty following simple commands, but was able to follow some with increased time and verbal/visual cues.       Exercises General Exercises - Upper Extremity Shoulder Flexion: AAROM;Both;Supine;10 reps Shoulder Extension: AAROM;Both;10 reps;Supine Digit Composite Flexion: AROM;Both;10 reps;Supine Composite Extension: AROM;Both;10 reps;Supine General Exercises - Lower Extremity Ankle Circles/Pumps: AAROM;Both;5 reps;Supine Heel Slides: AAROM;Both;10 reps;Supine    General Comments General comments (skin integrity, edema, etc.): Significant BLE edema. provided education to pt's daughter about importance of movement to decrease swelling      Pertinent Vitals/Pain Pain Assessment: Faces Faces Pain Scale: Hurts even more Pain Location: L knee, L shoulder Pain Descriptors / Indicators: Aching;Discomfort;Grimacing;Moaning;Sore Pain Intervention(s): Limited activity within patient's tolerance;Monitored during session;Repositioned    Home Living Family/patient expects to be discharged to:: Private  residence Living Arrangements: Children Available Help at Discharge: Family;Available PRN/intermittently Type of Home: House Home Access: Level entry   Home Layout: One level Home Equipment: Walker - 2 wheels;Bedside commode;Wheelchair - power;Wheelchair - manual;Hospital bed  Prior Function Level of Independence: Needs assistance  Gait / Transfers Assistance Needed: per pt's daughter, pt has been bed ridden for past 8 months. Refuses to get OOB due to fear of falling ADL's / Homemaking Assistance Needed: able to roll in bed, but requires max-total A for all ADLs Comments: Pt's daughter in the room, requires mod A-max A for all BADLs by rolling at bed level, and has not been OOB in past 8 months due to fear of falling. Pt's daughter reports that pt does have PCA to assist with BADLs   PT Goals (current goals can now be found in the care plan section) Acute Rehab PT Goals Patient Stated Goal: go back to sleep PT Goal Formulation: With patient Time For Goal Achievement: 06/03/19 Potential to Achieve Goals: Fair Progress towards PT goals: Progressing toward goals    Frequency    Min 2X/week      PT Plan Current plan remains appropriate    Co-evaluation PT/OT/SLP Co-Evaluation/Treatment: Yes Reason for Co-Treatment: For patient/therapist safety PT goals addressed during session: Mobility/safety with mobility OT goals addressed during session: ADL's and self-care      AM-PAC PT "6 Clicks" Mobility   Outcome Measure  Help needed turning from your back to your side while in a flat bed without using bedrails?: Total Help needed moving from lying on your back to sitting on the side of a flat bed without using bedrails?: Total Help needed moving to and from a bed to a chair (including a wheelchair)?: Total Help needed standing up from a chair using your arms (e.g., wheelchair or bedside chair)?: Total Help needed to walk in hospital room?: Total Help needed climbing 3-5  steps with a railing? : Total 6 Click Score: 6    End of Session Equipment Utilized During Treatment: Other (comment)(Maxislide) Activity Tolerance: Patient tolerated treatment well Patient left: in bed;with call bell/phone within reach;with chair alarm set;with family/visitor present Nurse Communication: Mobility status PT Visit Diagnosis: Other abnormalities of gait and mobility (R26.89);Muscle weakness (generalized) (M62.81);Difficulty in walking, not elsewhere classified (R26.2);Unsteadiness on feet (R26.81)     Time: [id number] PT Time Calculation (min) (ACUTE ONLY): 25 min  Charges:  $Therapeutic Activity: 23-37 mins                     Shanira Tine B. Migdalia Dk PT, DPT Acute Rehabilitation Services Pager [phone] Office [phone]    [place] 05/21/2019, 5:07 PM

## 2019-05-22 LAB — GLUCOSE, CAPILLARY
Glucose-Capillary: 135 mg/dL — ABNORMAL HIGH (ref 70–99)
Glucose-Capillary: 127 mg/dL — ABNORMAL HIGH (ref 70–99)
Glucose-Capillary: 129 mg/dL — ABNORMAL HIGH (ref 70–99)
Glucose-Capillary: 102 mg/dL — ABNORMAL HIGH (ref 70–99)
Glucose-Capillary: 124 mg/dL — ABNORMAL HIGH (ref 70–99)

## 2019-05-22 LAB — RENAL FUNCTION PANEL
Albumin: 2.7 g/dL — ABNORMAL LOW (ref 3.5–5.0)
Anion gap: 13 (ref 5–15)
BUN: 135 mg/dL — ABNORMAL HIGH (ref 8–23)
CO2: 28 mmol/L (ref 22–32)
Calcium: 9.4 mg/dL (ref 8.9–10.3)
Chloride: 105 mmol/L (ref 98–111)
Creatinine, Ser: 5.08 mg/dL — ABNORMAL HIGH (ref 0.44–1.00)
GFR calc Af Amer: 9 mL/min — ABNORMAL LOW (ref >60)
GFR calc non Af Amer: 8 mL/min — ABNORMAL LOW (ref >60)
Glucose, Bld: 142 mg/dL — ABNORMAL HIGH (ref 70–99)
Phosphorus: 5.5 mg/dL — ABNORMAL HIGH (ref 2.5–4.6)
Potassium: 4.9 mmol/L (ref 3.5–5.1)
Sodium: 146 mmol/L — ABNORMAL HIGH (ref 135–145)

## 2019-05-22 LAB — CBC
HCT: 27.5 % — ABNORMAL LOW (ref 36.0–46.0)
Hemoglobin: 7.6 g/dL — ABNORMAL LOW (ref 12.0–15.0)
MCH: 27.1 pg (ref 26.0–34.0)
MCHC: 27.6 g/dL — ABNORMAL LOW (ref 30.0–36.0)
MCV: 98.2 fL (ref 80.0–100.0)
Platelets: 288 10*3/uL (ref 150–400)
RBC: 2.8 MIL/uL — ABNORMAL LOW (ref 3.87–5.11)
RDW: 14.4 % (ref 11.5–15.5)
WBC: 7.2 10*3/uL (ref 4.0–10.5)
nRBC: 0 % (ref 0.0–0.2)

## 2019-05-22 LAB — PTH, INTACT AND CALCIUM
Calcium, Total (PTH): 9.4 mg/dL (ref 8.7–10.3)
PTH: 56 pg/mL (ref 15–65)

## 2019-05-22 LAB — PROTIME-INR
INR: 1.2 (ref 0.8–1.2)
Prothrombin Time: 14.6 seconds (ref 11.4–15.2)

## 2019-05-22 MED ORDER — AMLODIPINE BESYLATE 5 MG PO TABS
5.0000 mg | ORAL_TABLET | Freq: Every day | ORAL | Status: DC
  Administered 2019-05-22 – 2019-05-23 (×2): 5 mg via ORAL
  Filled 2019-05-22 (×2): qty 1

## 2019-05-22 NOTE — Progress Notes (Signed)
Went in and asked patient if she was ready to go on CPAP for tonight.  Patient shook her head no.  Spoke with her RN and let her know of the refusal.  I did ask the patient to let the RN know when she was ready to be put on.  No distress noted, will continue to monitor.

## 2019-05-22 NOTE — Progress Notes (Signed)
[facility] NEPHROLOGY PROGRESS NOTE  Assessment/ Plan: Pt is a 73 y.o. yo female with history of obesity, OSA, CKD stage IV admitted with respiratory failure, consulted for AKI on CKD.  #AKI on CKD stage IV: Baseline creatinine level around 3-3.5.  AKI likely hemodynamically mediated.  UA likely contaminant.  Kidney US with no obstruction, right kidney small.  Attempted IV fluid with no significant renal improvement. -Urine output is 1825 cc and serum creatinine level is a stable at 5.08.  BUN is elevated however patient does not have overt uremic symptoms.  I will continue to monitor for renal recovery.  Encourage oral intake.  Yesterday, I have discussed with the patient and her daughter at length today about worsening kidney function.  As per patient's daughter, the patient will receive dialysis if needed and family will support her.  I am not sure if she can tolerate outpatient HD. - Monitor BMP, urine output.  Avoid nephrotoxins.  #Ventilator dependent respiratory failure, now extubated nasal cannula.  Per pulmonary team.    #CHF, EF 60 to 65%: Initially diuresed with IV Lasix.  Holding further IV fluid.  #Anemia: Received blood transfusion.  Iron saturation 15%.  Continue iron and ESA.   #Acute encephalopathy, metabolic: Minimize opiates or sedatives.  Patient was alert awake and following commands.  #Hyperphosphatemia: Follow-up PTH level.  Monitor lab.  #General deconditioning.   Subjective: Seen and examined at bedside.  She was alert awake and comfortable lying flat.  Denied nausea vomiting chest pain shortness of breath. Objective Vital signs in last 24 hours: Vitals:   05/21/19 2301 05/22/19 0412 05/22/19 0727 05/22/19 0803  BP: (!) 161/77  (!) 170/66   Pulse: 62  79 84  Resp: 18  16 18   Temp: 98.5 F (36.9 C)  97.8 F (36.6 C)   TempSrc:   Oral   SpO2: 95%  100% 97%  Weight:  126.6 kg    Height:       Weight change: -52.2 kg  Intake/Output Summary  (Last 24 hours) at 05/22/2019 0918 Last data filed at 05/22/2019 0600 Gross per 24 hour  Intake 100 ml  Output 1825 ml  Net -1725 ml       Labs: Basic Metabolic Panel: Recent Labs  Lab 05/20/19 0742 05/21/19 0308 05/22/19 0355  NA 143 144 146*  K 4.9 4.9 4.9  CL 103 103 105  CO2 29 28 28   GLUCOSE 100* 97 142*  BUN 127* 134* 135*  CREATININE 4.77* 5.02* 5.08*  CALCIUM 8.9 9.2  9.4 9.4  PHOS 4.8* 5.3* 5.5*   Liver Function Tests: Recent Labs  Lab 05/17/19 0820  05/20/19 0742 05/21/19 0308 05/22/19 0355  AST 66*  --   --   --   --   ALT 30  --   --   --   --   ALKPHOS 109  --   --   --   --   BILITOT 0.4  --   --   --   --   PROT 6.2*  --   --   --   --   ALBUMIN 2.4*   < > 2.6* 2.5* 2.7*   < > = values in this interval not displayed.   No results for input(s): LIPASE, AMYLASE in the last 168 hours. No results for input(s): AMMONIA in the last 168 hours. CBC: Recent Labs  Lab 05/17/19 0820 05/18/19 0338  05/19/19 2230 05/20/19 0742 05/21/19 0308 05/22/19 0355  WBC  6.3 8.4  --  10.3  --  8.2 7.2  HGB 7.1* 7.0*   < > 6.7* 7.4* 7.3* 7.6*  HCT 24.5* 24.8*   < > 25.1* 27.4* 27.0* 27.5*  MCV 95.3 95.0  --  100.0  --  99.3 98.2  PLT 212 193  --  249  --  267 288   < > = values in this interval not displayed.   Cardiac Enzymes: No results for input(s): CKTOTAL, CKMB, CKMBINDEX, TROPONINI in the last 168 hours. CBG: Recent Labs  Lab 05/21/19 1704 05/21/19 1920 05/21/19 2304 05/22/19 0347 05/22/19 0723  GLUCAP 113* 121* 134* 135* 127*    Iron Studies:  Recent Labs    05/21/19 0308  IRON 40  TIBC 269  FERRITIN 94   Studies/Results: No results found.  Medications: Infusions: . sodium chloride Stopped (05/19/19 0600)  . ferric gluconate (FERRLECIT/NULECIT) IV 250 mg (05/21/19 1334)    Scheduled Medications: . amiodarone  200 mg Oral Daily  . bethanechol  5 mg Oral TID  . Chlorhexidine Gluconate Cloth  6 each Topical Q0600  . heparin  5,000  Units Subcutaneous Q8H  . insulin aspart  0-15 Units Subcutaneous Q4H  . levothyroxine  75 mcg Per Tube Q0600  . mouth rinse  15 mL Mouth Rinse BID  . metoprolol tartrate  12.5 mg Oral BID  . Ensure Max Protein  11 oz Oral BID    have reviewed scheduled and prn medications.  Physical Exam: General: Not in distress, lying on bed Heart:RRR, s1s2 nl, no rubs Lungs: Decreased breath sound bilateral, no wheezing Abdomen:soft, Non-tender, distended with marked obesity no abdominal wall edema Extremities: Bilateral lymphedema. Neurology: Alert awake and following simple commands  Dron [name] 05/22/2019,9:18 AM  LOS: 8 days  Pager: ID:[phone]

## 2019-05-22 NOTE — TOC Initial Note (Signed)
Transition of Care [facility]) - Initial/Assessment Note    Patient Details  Name: Jodi Underwood MRN: [id number] Date of Birth: 04-19-1946  Transition of Care [facility]) CM/SW Contact:    Kirstie Peri, [place] Work Phone Number: 05/22/2019, 11:35 AM  Clinical Narrative:       MSW Intern and MSW supervisor were requested to meet with pt and daughter, following MD conversation with them. MSW Intern made introduction with pt daughter, [name], pt was sleeping. Daughter expressed that they were still waiting to find out what next steps would be taken, and what services her mother would require. MSW verified that the daughter lived out of state, but would be a good point of contact, and that she would be in the area for a while. Daughter states that the families goal is to have mother return home with [facility]. Daughter gave the contact information for her sister Gilmore Laroche. Pt woke up and spoke briefly with social workers, and asked for her nurse. Daughter stated she did not have any further questions at the moment. MSW and MSW Intern will continue to follow.             Expected Discharge Plan: [place] Barriers to Discharge: Continued Medical Work up   Patient Goals and CMS Choice Patient states their goals for this hospitalization and ongoing recovery are:: To eventually return home w/ [facility] CMS Medicare.gov Compare Post Acute Care list provided to:: Patient Represenative (must comment)(Stephanie Bisceglia) Choice offered to / list presented to : Adult Children  Expected Discharge Plan and Services Expected Discharge Plan: [place] Choice: [place] arrangements for the past 2 months: Single Family Home                                      Prior Living Arrangements/Services Living arrangements for the past 2 months: Single Family Home Lives with:: Adult Children Patient language and need for interpreter reviewed::  No Do you feel safe going back to the place where you live?: Yes      Need for Family Participation in Patient Care: Yes (Comment) Care giver support system in place?: Yes (comment) Current home services: DME(Walker - 2 wheels;Bedside commode;Wheelchair - power) Criminal Activity/Legal Involvement Pertinent to Current Situation/Hospitalization: No - Comment as needed  Activities of Daily Living      Permission Sought/Granted Permission sought to share information with : Family Supports Permission granted to share information with : Yes, Verbal Permission Granted  Share Information with NAME: Fabiola Backer           Emotional Assessment Appearance:: Appears stated age Attitude/Demeanor/Rapport: Engaged Affect (typically observed): Appropriate Orientation: : Oriented to Self, Oriented to Place Alcohol / Substance Use: Not Applicable Psych Involvement: No (comment)  Admission diagnosis:  Shortness of breath [R06.02] Somnolence [R40.0] Renal insufficiency [N28.9] Macrocytic anemia [D53.9] Acute respiratory failure with hypoxia and hypercarbia (HCC) [J96.01, J96.02] Chest pain, unspecified type [R07.9] Patient Active Problem List   Diagnosis Date Noted  . Decubitus ulcer of coccygeal region, unspecified pressure ulcer stage 05/17/2019  . Respiratory failure ([place]) 05/14/2019  . Acute respiratory failure with hypoxia and hypercarbia (HCC)   . Chest pain   . Renal insufficiency   . Shortness of breath   . Dehydration   . CKD (chronic kidney disease) stage 3, GFR 30-59 ml/min 02/02/2018  . Nausea  and vomiting 02/02/2018  . Nausea vomiting and diarrhea 02/01/2018  . Encounter for palliative care   . Goals of care, counseling/discussion   . Chronic systolic CHF (congestive heart failure) ([place]) 12/20/2017  . A-fib ([place]) 06/11/2017  . Cellulitis, leg 10/13/2016  . AKI (acute kidney injury) ([place]) 10/13/2016  . Hypertension   . Lymphedema of both lower extremities   .  Abnormal CT of the abdomen   . Pressure injury of skin 04/28/2016  . Intractable lower abdominal pain 04/27/2016  . Constipation   . Respiratory distress   . Acute cystitis without hematuria   . Sepsis ([place]) 09/26/2014  . UTI (urinary tract infection) 09/26/2014  . Asthma 09/26/2014  . Asthma with acute exacerbation   . Blood poisoning   . Urinary tract infectious disease   . Diabetes type 2, uncontrolled ([place])   . Acute on chronic renal failure ([place])   . Essential hypertension, benign   . Acute renal failure ([place]) 01/25/2013  . Cellulitis of leg, bilateral 01/23/2013  . Ulcers of both lower extremities ([place]) 01/23/2013  . Lymphedema 06/13/2011  . DM type 2, uncontrolled, with neuropathy ([place]) 06/23/2010  . Morbid obesity ([place]) 06/23/2010  . ANEMIA-NOS 06/23/2010  . Essential hypertension 06/23/2010  . Asthma 06/23/2010   PCP:  Jodi Marble, MD Pharmacy:   [facility] [address] [place], [address] [place] [address] Phone: [phone] Fax: [phone]     Social Determinants of Health (SDOH) Interventions    Readmission Risk Interventions No flowsheet data found.

## 2019-05-22 NOTE — Progress Notes (Signed)
PROGRESS NOTE    Patient: Jodi Underwood                            PCP: Jodi Marble, MD                    DOB: [birth year]            DOA: 05/13/2019 [id number]             DOS: 05/22/2019, 12:49 PM   LOS: 8 days   Date of Service: The patient was seen and examined on 05/22/2019  Subjective:   The patient was seen and examined this morning, remained stable on O2 via nasal cannula, awake following command. Per nursing staff no issues overnight.  Poor p.o. intake reported. Found in hypertensive state this morning, satting 97% on 4 L of oxygen    Patient's daughter present at bedside: Patient's current care was discussed in detail yesterday and today. She was informed that her kidney function has progressively gotten worse since admission. If she declines her prognosis will worsen.    CODE STATUS was addressed patient wishes not to be intubated again.  Brief Narrative:   72 year old female with history significant for obesity, OSA, CKD admitted with acute hypercarbic respiratory failure requiring intubation likely r/t CPAP noncompliance c/b decompensated diastolic heart failure. Improving slowly with aggressive diuresis, vent support, HR control.   12/28 Admit to PCCM Intubated 05/14/2019 >>>> extubated 05/19/2019 05/20/2019 - Pulled bipap mask off repeatedly during the night.   Hgb 6.7 overnight - transfused 1 unit   05/21/2019 -patient was seen and examined, worsening kidney function noted, patient's daughter arrived from [place], updated -nephrology continue to follow, offered possibility of hemodialysis  05/22/2019 -continue to monitor kidney function, nephrology continue to follow, possibility of hemodialysis once again was discussed with the patient and daughter at bedside. Patient remains to have poor p.o. intake.   Assessment & Plan:   Active Problems:   Respiratory failure (HCC)   Acute respiratory failure with hypoxia and hypercarbia (HCC)   Chest pain    Renal insufficiency   Shortness of breath   Decubitus ulcer of coccygeal region, unspecified pressure ulcer stage   Acute hypoxic and hypercarbic respiratory failure requiring mechanical ventilation.   -The patient remained stable, currently on 4 L of oxygen, satting 97%   -Suspect this is secondary to volume overload with obesity hypoventilation and noncompliant CPAP use , bicarbonate is high suggesting OHS  Supplemental O2 as needed  Pulmonary hygiene  Mobilize -with aggressive PT OT qhs CPAP mandatory - needs ongoing discussions regarding importance of this  -She remains noncompliant  Acute on chronic heart failure-HFpEF EF 60-65%/A. fib/   -LVEF 60 to 65%, elevated LVEDP, diastolic dysfunction grade II. continue amiodarone, metoprolol --   Hypertension -would recent hypovolemia/hypotension -Monitoring closely, -Currently on metoprolol -We will titrate for better blood pressure control, anticipating adding Norvasc -Remained stable   Type II  diabetes, A1c 6.7-  --diet-controlled per daughter.  continue Lantus 13 units daily  -Checking blood sugar QA CHS with SSI  Hypothyroidism;  -TSH 9.2 at admission Continue home synthroid 75 mcg daily -Remained stable  AKI on Stage III CKD- suspect cardiorenal syndrome.  Baseline Scr ~3-3.5 Renal ultrasound without hydronephrosis.  Hyperkalemia-resolved -Worsening kidney function BUN of 134/creatinine of 5.02 >>> 5.08 today  -Nephrology following very closely, has met with the patient and her daughter possibility of hemodialysis was discussed At this  time patient remains a poor candidate for outpatient hemodialysis Holding diuresis for now   Acute encephalopathy -suspect metabolic/delirium.   Azotemia -Mentation continues to improve, alert and oriented x2 today Continue supportive care, physical therapy Continue seroquel   Generalized Deconditioning -- per daughter pt lives with family and is mentally very sharp,  but is afraid of falling so rarely gets out of the chair, they have PT coming once per week -Pending PT/OT evaluation and recommendation  Prolonged QTc due to meds-resolved -Continue Seroquel; be cautious starting other QTC prolonging meds -Monitoring closely  Severe lower extremity edema exacerbated by lymphedema, -We will continue to monitor, holding diuretics at this time   DVT prophylaxis: SCD/Compression stockings Code Status:   Code Status: Full Code Family Communication:  Patient's daughter was updated plan of care was discussed in detail her cell phone [phone]  Disposition Plan:   Anticipated 1-2 days Admission status:  [place] -   Consultants:  PCCM/Nephrology/ENT   Significant Diagnostic Tests:  10/28 CT head>>no acute intracranial findings 10/28 echo LVEF 60 to 32%, grade 2 diastolic dysfunction, normal RV, normal atria, normal valves  Micro Data:  10/28 Sars-CoV-2>>neg 10/28 resp >> negative 10/28 RVP >> neg [facility] 10/27 >> neg  Trach aspirate 10/31>>  Antimicrobials:  ceftx 10/28 >>10/30 azithro 10/28 >> 10/30    Procedures:   Intubated 05/14/2019 >>>> extubated 05/19/2019  Antimicrobials:  Anti-infectives (From admission, onward)   Start     Dose/Rate Route Frequency Ordered Stop   05/14/19 0900  azithromycin (ZITHROMAX) 500 mg in sodium chloride 0.9 % 250 mL IVPB     500 mg 250 mL/hr over 60 Minutes Intravenous Daily 05/14/19 0830 05/16/19 1104   05/14/19 0500  azithromycin (ZITHROMAX) 500 mg in sodium chloride 0.9 % 250 mL IVPB  Status:  Discontinued     500 mg 250 mL/hr over 60 Minutes Intravenous Daily 05/14/19 0331 05/14/19 0830   05/14/19 0330  cefTRIAXone (ROCEPHIN) 2 g in sodium chloride 0.9 % 100 mL IVPB  Status:  Discontinued     2 g 200 mL/hr over 30 Minutes Intravenous Daily at bedtime 05/14/19 0310 05/16/19 0908       Medication:  . amiodarone  200 mg Oral Daily  . bethanechol  5 mg Oral TID  . Chlorhexidine Gluconate Cloth   6 each Topical Q0600  . heparin  5,000 Units Subcutaneous Q8H  . insulin aspart  0-15 Units Subcutaneous Q4H  . levothyroxine  75 mcg Per Tube Q0600  . mouth rinse  15 mL Mouth Rinse BID  . metoprolol tartrate  12.5 mg Oral BID  . Ensure Max Protein  11 oz Oral BID    sodium chloride, acetaminophen, albuterol, HYDROcodone-acetaminophen, labetalol, polyethylene glycol, Resource ThickenUp Clear   Objective:   Vitals:   05/21/19 2301 05/22/19 0412 05/22/19 0727 05/22/19 0803  BP: (!) 161/77  (!) 170/66   Pulse: 62  79 84  Resp: _0 Temp: 98.5 F (36.9 C)  97.8 F (36.6 C)   TempSrc:   Oral   SpO2: 95%  100% 97%  Weight:  126.6 kg    Height:        Intake/Output Summary (Last 24 hours) at 05/22/2019 1249 Last data filed at 05/22/2019 1000 Gross per 24 hour  Intake 100 ml  Output 1775 ml  Net -1675 ml   Filed Weights   05/20/19 1315 05/21/19 0528 05/22/19 0412  Weight: (!) 178.8 kg (!) 179.2 kg 126.6 kg  Examination:   Physical Exam    Physical Exam  Constitution:  Alert, cooperative, no distress,  Psychiatric:  stable mood   HEENT: Normocephalic, PERRL, otherwise with in Normal limits  Chest:Chest symmetric Cardio vascular:  S1/S2, RRR, No murmure, No Rubs or Gallops  pulmonary: Clear to auscultation bilaterally, respirations unlabored, negative wheezes / crackles Abdomen: Soft, non-tender, non-distended, bowel sounds,no masses, no organomegaly Muscular skeletal:  Severe generalized weaknesses, Limited exam - in bed, able to move all 4 extremities, Normal strength,  Neuro: CNII-XII intact. , normal motor and sensation, reflexes intact  Extremities: +++  pitting edema lower extremities, +2 pulses  Skin: Dry, warm to touch, negative for any Rashes, No open wounds Wounds: per nursing documentation    LABs:  CBC Latest Ref Rng & Units 05/22/2019 05/21/2019 05/20/2019  WBC 4.0 - 10.5 K/uL 7.2 8.2 -  Hemoglobin 12.0 - 15.0 g/dL 7.6(L) 7.3(L) 7.4(L)   Hematocrit 36.0 - 46.0 % 27.5(L) 27.0(L) 27.4(L)  Platelets 150 - 400 K/uL 288 267 -   CMP Latest Ref Rng & Units 05/22/2019 05/21/2019 05/21/2019  Glucose 70 - 99 mg/dL 142(H) 97 -  BUN 8 - 23 mg/dL 135(H) 134(H) -  Creatinine 0.44 - 1.00 mg/dL 5.08(H) 5.02(H) -  Sodium 135 - 145 mmol/L 146(H) 144 -  Potassium 3.5 - 5.1 mmol/L 4.9 4.9 -  Chloride 98 - 111 mmol/L 105 103 -  CO2 22 - 32 mmol/L 28 28 -  Calcium 8.9 - 10.3 mg/dL 9.4 9.2 9.4  Total Protein 6.5 - 8.1 g/dL - - -  Total Bilirubin 0.3 - 1.2 mg/dL - - -  Alkaline Phos 38 - 126 U/L - - -  AST 15 - 41 U/L - - -  ALT 0 - 44 U/L - - -        SIGNED: Deatra James, MD, FACP, FHM. [facility],  Pager [phone]  If 7PM-7AM, please contact night-coverage [email or web address] Ota [facility] 05/22/2019, 12:49 PM

## 2019-05-22 NOTE — Progress Notes (Addendum)
patient and daughter c/o bariatric bed not comfortable. Offered to get another bariatric bed but daughter wanted patient placed on regular bed. Both patient and educated on importance of air mattress considering that patient already has skin breakdown.  Patient and daughter still wanted to change to regular bed and same done

## 2019-05-23 LAB — RENAL FUNCTION PANEL
Albumin: 2.5 g/dL — ABNORMAL LOW (ref 3.5–5.0)
Anion gap: 10 (ref 5–15)
BUN: 123 mg/dL — ABNORMAL HIGH (ref 8–23)
CO2: 29 mmol/L (ref 22–32)
Calcium: 9.1 mg/dL (ref 8.9–10.3)
Chloride: 106 mmol/L (ref 98–111)
Creatinine, Ser: 4.91 mg/dL — ABNORMAL HIGH (ref 0.44–1.00)
GFR calc Af Amer: 9 mL/min — ABNORMAL LOW (ref >60)
GFR calc non Af Amer: 8 mL/min — ABNORMAL LOW (ref >60)
Glucose, Bld: 124 mg/dL — ABNORMAL HIGH (ref 70–99)
Phosphorus: 5 mg/dL — ABNORMAL HIGH (ref 2.5–4.6)
Potassium: 4.4 mmol/L (ref 3.5–5.1)
Sodium: 145 mmol/L (ref 135–145)

## 2019-05-23 LAB — CBC
HCT: 28.3 % — ABNORMAL LOW (ref 36.0–46.0)
Hemoglobin: 7.7 g/dL — ABNORMAL LOW (ref 12.0–15.0)
MCH: 26.6 pg (ref 26.0–34.0)
MCHC: 27.2 g/dL — ABNORMAL LOW (ref 30.0–36.0)
MCV: 97.6 fL (ref 80.0–100.0)
Platelets: 281 10*3/uL (ref 150–400)
RBC: 2.9 MIL/uL — ABNORMAL LOW (ref 3.87–5.11)
RDW: 14.4 % (ref 11.5–15.5)
WBC: 6.7 10*3/uL (ref 4.0–10.5)
nRBC: 0.3 % — ABNORMAL HIGH (ref 0.0–0.2)

## 2019-05-23 LAB — GLUCOSE, CAPILLARY
Glucose-Capillary: 105 mg/dL — ABNORMAL HIGH (ref 70–99)
Glucose-Capillary: 106 mg/dL — ABNORMAL HIGH (ref 70–99)
Glucose-Capillary: 117 mg/dL — ABNORMAL HIGH (ref 70–99)
Glucose-Capillary: 122 mg/dL — ABNORMAL HIGH (ref 70–99)
Glucose-Capillary: 130 mg/dL — ABNORMAL HIGH (ref 70–99)
Glucose-Capillary: 138 mg/dL — ABNORMAL HIGH (ref 70–99)

## 2019-05-23 LAB — PARATHYROID HORMONE, INTACT (NO CA): PTH: 39 pg/mL (ref 15–65)

## 2019-05-23 LAB — BRAIN NATRIURETIC PEPTIDE: B Natriuretic Peptide: 728.9 pg/mL — ABNORMAL HIGH (ref 0.0–100.0)

## 2019-05-23 MED ORDER — LACTULOSE 10 GM/15ML PO SOLN
10.0000 g | Freq: Every day | ORAL | Status: DC | PRN
  Administered 2019-05-23: 10 g via ORAL
  Filled 2019-05-23: qty 15

## 2019-05-23 MED ORDER — AMLODIPINE BESYLATE 10 MG PO TABS
10.0000 mg | ORAL_TABLET | Freq: Every day | ORAL | Status: DC
  Administered 2019-05-24 – 2019-05-31 (×8): 10 mg via ORAL
  Filled 2019-05-23 (×8): qty 1

## 2019-05-23 NOTE — Progress Notes (Signed)
  Speech Language Pathology Treatment: Dysphagia  Patient Details Name: Jodi Underwood MRN: [id number] DOB: [birth year] Today's Date: 05/23/2019 Time: 3009-2330 SLP Time Calculation (min) (ACUTE ONLY): 8 min  Assessment / Plan / Recommendation Clinical Impression  Pt states she does not like the thickened liquids, but also has no appetite due to constipation. Pt able to drink 4 oz consecutively without signs of aspiration x2. Also able to masticate well, Still reports a slight globus sensation with solids, but pain with swallowing is gone. Recommend pt resume a regular diet and thin liquids as she reports she will be ready to eat when her digestion is better. No further SLP needs at this time. Will sign off.   HPI HPI: 73 year old woman with obesity hypoventilation syndrome. Critically ill due to hypercarbic respiratory failure likely secondary to noncompliance with CPAP at home. Intubated from 10/28 to 11/2       SLP Plan  All goals met       Recommendations  Diet recommendations: Regular;Thin liquid Liquids provided via: Cup;Straw Medication Administration: Whole meds with puree Supervision: Staff to assist with self feeding;Trained caregiver to feed patient Compensations: Slow rate;Small sips/bites Postural Changes and/or Swallow Maneuvers: Seated upright 90 degrees                Follow up Recommendations: None Plan: All goals met       GO               Herbie Baltimore, MA CCC-SLP  Acute Rehabilitation Services Pager [phone] Office [phone]  Lynann Beaver 05/23/2019, 10:05 AM

## 2019-05-23 NOTE — Plan of Care (Signed)
  Problem: Clinical Measurements: Goal: Respiratory complications will improve Outcome: Progressing   

## 2019-05-23 NOTE — Progress Notes (Signed)
Patient was on CPAP when I came into room.  Patient sat is 100% on a pressure of 4 with an O2 flow of 8 bled into CPAP.  BS are clear and diminished.  No distress noted excepted for patient complaining of pain on her bottom and her neuropathy hurting her in her legs she stated.  Will continue to monitor.

## 2019-05-23 NOTE — Progress Notes (Signed)
Pt unable to be moved/turned for patient care. Pt constantly has c/o pain in her buttocks and requests to be turned frequently; pt unable to be turned. Pain medication given per eMar. Pt was educated on benefits of bariatric bed - pt agreed to try - pt is now more comfortable on air mattress. Pt has mild MASD to bottom. Pt was educated on need for bariatric bed; settings adjusted to comfort for pt. Pt verbalized understanding - pt able to tolerate sitting, reclining, turning in bed very well.

## 2019-05-23 NOTE — Progress Notes (Signed)
[facility] NEPHROLOGY PROGRESS NOTE  Assessment/ Plan: Pt is a 73 y.o. yo female with history of obesity, OSA, CKD stage IV admitted with respiratory failure, consulted for AKI on CKD.  #AKI on CKD stage IV: Baseline creatinine level around 3-3.5.  AKI likely hemodynamically mediated.  UA likely contaminant.  Kidney US with no obstruction, right kidney small.  Attempted IV fluid with no significant renal improvement. -Urine output is 2000 cc and serum creatinine, BUN level trending down.  She has no overt uremic symptoms.  She is hesitant to start dialysis.  For now continue to monitor for renal recovery.   On 11/4, I have discussed with the patient and her daughter at length  about worsening kidney function.  As per patient's daughter, the patient will receive dialysis if needed and family will support her.  I am not sure if she can tolerate outpatient HD. - Monitor BMP, urine output.  Avoid nephrotoxins.  #Ventilator dependent respiratory failure, now extubated nasal cannula.  Per pulmonary team.    #CHF, EF 60 to 65%: Initially diuresed with IV Lasix.  Holding further IV fluid.  #Anemia: Received blood transfusion.  Iron saturation 15%.  Treated with IV iron and ESA.  #Acute encephalopathy, metabolic: Minimize opiates or sedatives.  Patient was alert awake and following commands.  #Hyperphosphatemia: PTH level 39.  Monitor phosphorus level.  #General deconditioning.   Subjective: Seen and examined at bedside.  She was alert awake and comfortable lying flat.  Denied nausea vomiting chest pain shortness of breath.  Reports constipation. Objective Vital signs in last 24 hours: Vitals:   05/22/19 2342 05/23/19 0000 05/23/19 0800 05/23/19 0809  BP:  (!) 154/61  (!) 172/55  Pulse: 86 72  68  Resp: 18 18 18 18   Temp:  97.8 F (36.6 C)  (!) 97.5 F (36.4 C)  TempSrc:  Axillary  Oral  SpO2: 98% 100%  100%  Weight:      Height:       Weight change:   Intake/Output  Summary (Last 24 hours) at 05/23/2019 0956 Last data filed at 05/23/2019 0600 Gross per 24 hour  Intake 30.93 ml  Output 2000 ml  Net -1969.07 ml       Labs: Basic Metabolic Panel: Recent Labs  Lab 05/21/19 0308 05/22/19 0355 05/23/19 0356  NA 144 146* 145  K 4.9 4.9 4.4  CL 103 105 106  CO2 28 28 29   GLUCOSE 97 142* 124*  BUN 134* 135* 123*  CREATININE 5.02* 5.08* 4.91*  CALCIUM 9.2  9.4 9.4 9.1  PHOS 5.3* 5.5* 5.0*   Liver Function Tests: Recent Labs  Lab 05/17/19 0820  05/21/19 0308 05/22/19 0355 05/23/19 0356  AST 66*  --   --   --   --   ALT 30  --   --   --   --   ALKPHOS 109  --   --   --   --   BILITOT 0.4  --   --   --   --   PROT 6.2*  --   --   --   --   ALBUMIN 2.4*   < > 2.5* 2.7* 2.5*   < > = values in this interval not displayed.   No results for input(s): LIPASE, AMYLASE in the last 168 hours. No results for input(s): AMMONIA in the last 168 hours. CBC: Recent Labs  Lab 05/18/19 0338  05/19/19 2230  05/21/19 0308 05/22/19 0355 05/23/19 0356  WBC 8.4  --  10.3  --  8.2 7.2 6.7  HGB 7.0*   < > 6.7*   < > 7.3* 7.6* 7.7*  HCT 24.8*   < > 25.1*   < > 27.0* 27.5* 28.3*  MCV 95.0  --  100.0  --  99.3 98.2 97.6  PLT 193  --  249  --  267 288 281   < > = values in this interval not displayed.   Cardiac Enzymes: No results for input(s): CKTOTAL, CKMB, CKMBINDEX, TROPONINI in the last 168 hours. CBG: Recent Labs  Lab 05/22/19 1635 05/22/19 1945 05/23/19 0014 05/23/19 0342 05/23/19 0810  GLUCAP 102* 124* 122* 106* 117*    Iron Studies:  Recent Labs    05/21/19 0308  IRON 40  TIBC 269  FERRITIN 94   Studies/Results: No results found.  Medications: Infusions: . sodium chloride Stopped (05/22/19 1552)    Scheduled Medications: . amiodarone  200 mg Oral Daily  . amLODipine  5 mg Oral Daily  . bethanechol  5 mg Oral TID  . Chlorhexidine Gluconate Cloth  6 each Topical Q0600  . heparin  5,000 Units Subcutaneous Q8H  . insulin  aspart  0-15 Units Subcutaneous Q4H  . levothyroxine  75 mcg Per Tube Q0600  . mouth rinse  15 mL Mouth Rinse BID  . metoprolol tartrate  12.5 mg Oral BID  . Ensure Max Protein  11 oz Oral BID    have reviewed scheduled and prn medications.  Physical Exam: General: Not in distress, lying on bed Heart:RRR, s1s2 nl, no rubs Lungs: Distant breath sound, no wheezing Abdomen:soft, Non-tender, distended with marked obesity no abdominal wall edema Extremities: Bilateral lymphedema. Neurology: Alert awake and following simple commands  Raza Bayless [name] 05/23/2019,9:56 AM  LOS: 9 days  Pager: ID:[phone]

## 2019-05-23 NOTE — Progress Notes (Signed)
PROGRESS NOTE    Patient: Jodi Underwood                            PCP: Jodi Marble, MD                    DOB: 1946/02/21            DOA: 05/13/2019 BLT:[id number]             DOS: 05/23/2019, 11:36 AM   LOS: 9 days   Date of Service: The patient was seen and examined on 05/23/2019  Subjective:   The patient was seen and examined this morning, stable. Awake alert oriented x3.  Reporting improved shortness of breath, complaining of persistent lower extremity edema, abdominal pain constipation. No issues overnight  CODE STATUS was addressed patient wishes not to be intubated again.  Brief Narrative:   73 year old female with history significant for obesity, OSA, CKD admitted with acute hypercarbic respiratory failure requiring intubation likely r/t CPAP noncompliance c/b decompensated diastolic heart failure. Improving slowly with aggressive diuresis, vent support, HR control.   12/28 Admit to PCCM Intubated 05/14/2019 >>>> extubated 05/19/2019 05/20/2019 - Pulled bipap mask off repeatedly during the night.   Hgb 6.7 overnight - transfused 1 unit   05/21/2019 -patient was seen and examined, worsening kidney function noted, patient's daughter arrived from [place], updated -nephrology continue to follow, offered possibility of hemodialysis  05/22/2019 -continue to monitor kidney function, nephrology continue to follow, possibility of hemodialysis once again was discussed with the patient and daughter at bedside. Patient remains to have poor p.o. intake.   Assessment & Plan:   Active Problems:   Respiratory failure (HCC)   Acute respiratory failure with hypoxia and hypercarbia (HCC)   Chest pain   Renal insufficiency   Shortness of breath   Decubitus ulcer of coccygeal region, unspecified pressure ulcer stage   Acute hypoxic and hypercarbic respiratory failure requiring mechanical ventilation.   -She remained stable, -Still on 4 L of oxygen, satting 100% -Unable to  tolerate CPAP/BiPAP  -Suspect this is secondary to volume overload with obesity hypoventilation and noncompliant CPAP use , bicarbonate is high suggesting OHS  Supplemental O2 as needed  Pulmonary hygiene  Mobilize -with aggressive PT OT qhs CPAP mandatory - needs ongoing discussions regarding importance of this  -She remains noncompliant   AKI on Stage III CKD- suspect cardiorenal syndrome. ( Baseline Scr ~3-3.5) -Monitoring closely still able to void  Renal ultrasound without hydronephrosis.  -Worsening kidney function BUN of 134/creatinine of 5.02 >>> 5.08 >> 4.19 today  -Nephrology following very closely, has met with the patient and her daughter possibility of hemodialysis was discussed At this time patient remains a poor candidate for outpatient hemodialysis Holding diuresis for now   Acute on chronic heart failure-HFpEF EF 60-65%/A. fib/   -LVEF 60 to 65%, elevated LVEDP, diastolic dysfunction grade II. continue amiodarone, metoprolol    Hypertension -would recent hypovolemia/hypotension -Remained stable, elevated blood pressure this a.m. -Currently on metoprolol -Titrating meds including metoprolol and Norvasc for better blood pressure control -Remained stable   Type II  diabetes, A1c 6.7-  --diet-controlled per daughter.  continue Lantus 13 units daily  -Checking blood sugar QA CHS with SSI  Hypothyroidism;  -TSH 9.2 at admission Continue home synthroid 75 mcg daily -Remained stable   Acute encephalopathy -suspect metabolic/delirium.   Azotemia -Mentation continues to improve, alert and oriented x2 today Continue supportive care,  physical therapy Continue seroquel   Generalized Deconditioning -- per daughter pt lives with family and is mentally very sharp, but is afraid of falling so rarely gets out of the chair, they have PT coming once per week -Pending PT/OT evaluation and recommendation  Prolonged QTc due to meds-resolved -Continue Seroquel; be  cautious starting other QTC prolonging meds -Monitoring closely  Severe lower extremity edema exacerbated by lymphedema, -We will continue to monitor, holding diuretics at this time   DVT prophylaxis: SCD/Compression stockings Code Status:   Code Status: Full Code Family Communication:  Patient's daughter was updated plan of care was discussed in detail her cell phone [phone]  Disposition Plan:   Anticipated 1-2 days Admission status:  [place] -   Consultants:  PCCM/Nephrology/ENT   Significant Diagnostic Tests:  10/28 CT head>>no acute intracranial findings 10/28 echo LVEF 60 to 32%, grade 2 diastolic dysfunction, normal RV, normal atria, normal valves  Micro Data:  10/28 Sars-CoV-2>>neg 10/28 resp >> negative 10/28 RVP >> neg [facility] 10/27 >> neg  Trach aspirate 10/31>>  Antimicrobials:  ceftx 10/28 >>10/30 azithro 10/28 >> 10/30    Procedures:   Intubated 05/14/2019 >>>> extubated 05/19/2019  Antimicrobials:  Anti-infectives (From admission, onward)   Start     Dose/Rate Route Frequency Ordered Stop   05/14/19 0900  azithromycin (ZITHROMAX) 500 mg in sodium chloride 0.9 % 250 mL IVPB     500 mg 250 mL/hr over 60 Minutes Intravenous Daily 05/14/19 0830 05/16/19 1104   05/14/19 0500  azithromycin (ZITHROMAX) 500 mg in sodium chloride 0.9 % 250 mL IVPB  Status:  Discontinued     500 mg 250 mL/hr over 60 Minutes Intravenous Daily 05/14/19 0331 05/14/19 0830   05/14/19 0330  cefTRIAXone (ROCEPHIN) 2 g in sodium chloride 0.9 % 100 mL IVPB  Status:  Discontinued     2 g 200 mL/hr over 30 Minutes Intravenous Daily at bedtime 05/14/19 0310 05/16/19 0908       Medication:  . amiodarone  200 mg Oral Daily  . amLODipine  5 mg Oral Daily  . bethanechol  5 mg Oral TID  . Chlorhexidine Gluconate Cloth  6 each Topical Q0600  . heparin  5,000 Units Subcutaneous Q8H  . insulin aspart  0-15 Units Subcutaneous Q4H  . levothyroxine  75 mcg Per Tube Q0600  . mouth rinse   15 mL Mouth Rinse BID  . metoprolol tartrate  12.5 mg Oral BID  . Ensure Max Protein  11 oz Oral BID    sodium chloride, acetaminophen, albuterol, HYDROcodone-acetaminophen, labetalol, lactulose, polyethylene glycol, Resource ThickenUp Clear   Objective:   Vitals:   05/22/19 2342 05/23/19 0000 05/23/19 0800 05/23/19 0809  BP:  (!) 154/61  (!) 172/55  Pulse: 86 72  68  Resp: _0 Temp:  97.8 F (36.6 C)  (!) 97.5 F (36.4 C)  TempSrc:  Axillary  Oral  SpO2: 98% 100%  100%  Weight:      Height:        Intake/Output Summary (Last 24 hours) at 05/23/2019 1136 Last data filed at 05/23/2019 0600 Gross per 24 hour  Intake 30.93 ml  Output 1250 ml  Net -1219.07 ml   Filed Weights   05/20/19 1315 05/21/19 0528 05/22/19 0412  Weight: (!) 178.8 kg (!) 179.2 kg 126.6 kg     Examination:   Physical Exam  Constitution:  Alert, cooperative, no distress, improved shortness of breath Psychiatric: Normal and stable mood and affect, cognition  intact,   HEENT: Normocephalic, PERRL, otherwise with in Normal limits  Chest:Chest symmetric Cardio vascular:  S1/S2, RRR, No murmure, No Rubs or Gallops  pulmonary: Clear to auscultation bilaterally, respirations unlabored, negative wheezes / crackles Abdomen: Soft, non-tender, non-distended, bowel sounds,no masses, no organomegaly Muscular skeletal:  Severe generalized weaknesses Limited exam - in bed, able to move all 4 extremities, Normal strength,  Neuro: CNII-XII intact. , normal motor and sensation, reflexes intact  Extremities:  Extensive lymphedema in the bilateral lower extremity noted, +++3   edema lower extremities, +2 pulses  Skin: Dry, warm to touch, negative for any Rashes, No open wounds Wounds: per nursing documentation      LABs:  CBC Latest Ref Rng & Units 05/23/2019 05/22/2019 05/21/2019  WBC 4.0 - 10.5 K/uL 6.7 7.2 8.2  Hemoglobin 12.0 - 15.0 g/dL 7.7(L) 7.6(L) 7.3(L)  Hematocrit 36.0 - 46.0 % 28.3(L) 27.5(L)  27.0(L)  Platelets 150 - 400 K/uL 281 288 267   CMP Latest Ref Rng & Units 05/23/2019 05/22/2019 05/21/2019  Glucose 70 - 99 mg/dL 124(H) 142(H) 97  BUN 8 - 23 mg/dL 123(H) 135(H) 134(H)  Creatinine 0.44 - 1.00 mg/dL 4.91(H) 5.08(H) 5.02(H)  Sodium 135 - 145 mmol/L 145 146(H) 144  Potassium 3.5 - 5.1 mmol/L 4.4 4.9 4.9  Chloride 98 - 111 mmol/L 106 105 103  CO2 22 - 32 mmol/L _0 Calcium 8.9 - 10.3 mg/dL 9.1 9.4 9.2  Total Protein 6.5 - 8.1 g/dL - - -  Total Bilirubin 0.3 - 1.2 mg/dL - - -  Alkaline Phos 38 - 126 U/L - - -  AST 15 - 41 U/L - - -  ALT 0 - 44 U/L - - -        SIGNED: Deatra James, MD, FACP, FHM. [facility],  Pager [phone]  If 7PM-7AM, please contact night-coverage [email or web address] 05/23/2019, 11:36 AM

## 2019-05-23 NOTE — Progress Notes (Signed)
Patient had mild success with Lactulose. She required digital stimulation and partial disimpaction as well. She had a large soft stool burden in the rectum which was distending her perineum.

## 2019-05-24 ENCOUNTER — Inpatient Hospital Stay (HOSPITAL_COMMUNITY): Payer: Medicare Other

## 2019-05-24 LAB — RENAL FUNCTION PANEL
Albumin: 2.6 g/dL — ABNORMAL LOW (ref 3.5–5.0)
Anion gap: 10 (ref 5–15)
BUN: 116 mg/dL — ABNORMAL HIGH (ref 8–23)
CO2: 31 mmol/L (ref 22–32)
Calcium: 9.1 mg/dL (ref 8.9–10.3)
Chloride: 108 mmol/L (ref 98–111)
Creatinine, Ser: 4.53 mg/dL — ABNORMAL HIGH (ref 0.44–1.00)
GFR calc Af Amer: 10 mL/min — ABNORMAL LOW (ref >60)
GFR calc non Af Amer: 9 mL/min — ABNORMAL LOW (ref >60)
Glucose, Bld: 134 mg/dL — ABNORMAL HIGH (ref 70–99)
Phosphorus: 4.9 mg/dL — ABNORMAL HIGH (ref 2.5–4.6)
Potassium: 4.3 mmol/L (ref 3.5–5.1)
Sodium: 149 mmol/L — ABNORMAL HIGH (ref 135–145)

## 2019-05-24 LAB — CBC
HCT: 28.5 % — ABNORMAL LOW (ref 36.0–46.0)
Hemoglobin: 7.8 g/dL — ABNORMAL LOW (ref 12.0–15.0)
MCH: 27.4 pg (ref 26.0–34.0)
MCHC: 27.4 g/dL — ABNORMAL LOW (ref 30.0–36.0)
MCV: 100 fL (ref 80.0–100.0)
Platelets: 292 10*3/uL (ref 150–400)
RBC: 2.85 MIL/uL — ABNORMAL LOW (ref 3.87–5.11)
RDW: 14.5 % (ref 11.5–15.5)
WBC: 7.9 10*3/uL (ref 4.0–10.5)
nRBC: 0.4 % — ABNORMAL HIGH (ref 0.0–0.2)

## 2019-05-24 LAB — GLUCOSE, CAPILLARY
Glucose-Capillary: 101 mg/dL — ABNORMAL HIGH (ref 70–99)
Glucose-Capillary: 107 mg/dL — ABNORMAL HIGH (ref 70–99)
Glucose-Capillary: 117 mg/dL — ABNORMAL HIGH (ref 70–99)
Glucose-Capillary: 124 mg/dL — ABNORMAL HIGH (ref 70–99)
Glucose-Capillary: 127 mg/dL — ABNORMAL HIGH (ref 70–99)
Glucose-Capillary: 143 mg/dL — ABNORMAL HIGH (ref 70–99)
Glucose-Capillary: 158 mg/dL — ABNORMAL HIGH (ref 70–99)

## 2019-05-24 MED ORDER — TRAZODONE HCL 50 MG PO TABS
50.0000 mg | ORAL_TABLET | Freq: Once | ORAL | Status: AC
  Administered 2019-05-24: 50 mg via ORAL
  Filled 2019-05-24: qty 1

## 2019-05-24 MED ORDER — FUROSEMIDE 10 MG/ML IJ SOLN
80.0000 mg | Freq: Two times a day (BID) | INTRAMUSCULAR | Status: DC
  Administered 2019-05-24: 80 mg via INTRAVENOUS
  Filled 2019-05-24 (×2): qty 8

## 2019-05-24 NOTE — Progress Notes (Signed)
Pt has spent entire shift on both beds constantly asking to be pulled up in the bed. Pt's head is even with the top of the mattress. Pt states that she wants her head hanging over the top edge of the mattress - explained to pt that her torso is not long enough to sit in the bed and have her head over the top of the mattress. Pt insists that she is tall at 5'6" and it can be done.

## 2019-05-24 NOTE — Progress Notes (Signed)
PROGRESS NOTE    Jodi Underwood  [id number] DOB: 1945-11-12 DOA: 05/13/2019 PCP: Jodi Marble, MD    Brief Narrative:  73 year old female with history significant for obesity, OSA, CKDadmitted with acute hypercarbic respiratory failure requiring intubation likely r/t CPAP noncompliance c/b decompensated diastolic heart failure. Improving slowly with aggressive diuresis, vent support, HR control. 12/28 Admit to PCCM Intubated 05/14/2019 >>>> extubated 05/19/2019  Assessment & Plan:   Active Problems:   Respiratory failure (HCC)   Acute respiratory failure with hypoxia and hypercarbia (HCC)   Chest pain   Renal insufficiency   Shortness of breath   Decubitus ulcer of coccygeal region, unspecified pressure ulcer stage  Acute hypoxic and hypercarbic respiratory failure requiring mechanical ventilation.  -She remained stable, -currently on 4 L of oxygen with appropriate O2 sats, noted to be on NRB this AM. When seen later, on [place] and appeared stable  -Suspect this is secondary to volume overload with obesity hypoventilation and noncompliant CPAP use , bicarbonate is high suggesting OHS  Continue supplemental O2 as needed  Pulmonary hygiene  Mobilize -with aggressive PT OT qhs CPAP mandatory - needs ongoing discussions regarding importance of this -She remains noncompliant   AKI on Stage III CKD-suspect cardiorenal syndrome. (Baseline Scr ~3-3.5) -Monitoring closely still able to void  Renal ultrasound without hydronephrosis.  -Nephrology continues to follow -Pt clinically volume overloaded, discussed with Nephrology. Plan for trial of lasix At this time patient remains a poor candidate for outpatient hemodialysis -Repeat bmet in AM  Acute on chronic heart failure-HFpEFEF 60-65%/A. fib/  -LVEF 60 to 65%, elevated LVEDP, diastolic dysfunction grade II. continueamiodarone, metoprolol -Stable at this time  Hypertension -would recent  hypovolemia/hypotension -Remained stable, elevated blood pressure this a.m. -Currently on metoprolol -Titrating meds including metoprolol and Norvasc for better blood pressure control -Remains stable  Type II  diabetes, A1c 6.7- --diet-controlled per daughter.  continueLantus 13 units daily  -Checking blood sugar QA CHS with SSI -Glycemic trends are stable  Hypothyroidism;  -TSH 9.2 at admission Continue home synthroid75 mcg daily -Remains stable at this time   Acute encephalopathy -suspect metabolic/delirium.  Azotemia -Mentation continues to improve, alert and oriented x2 today Continue supportive care, physical therapy Continue seroquelas tolerated  Generalized Deconditioning -- per daughter pt lives with family and is mentally very sharp, but is afraid of falling so rarely gets out of the chair, they have PT coming once per week -Therapy recs for SNF  Prolonged QTc due to meds-resolved -Continue Seroquel; be cautious starting other QTC prolonging meds -Cont to monitor  Severe lower extremity edema exacerbated by lymphedema, -Trial of lasix per Nerphology recs -Repeat bmet in AM  DVT prophylaxis: Heparin subQ Code Status: Full Family Communication: Pt in room, family not at bedside Disposition Plan: Uncertain at this time  Consultants:   PCCM, Nephrology, ENT  Procedures:     Antimicrobials: Anti-infectives (From admission, onward)   Start     Dose/Rate Route Frequency Ordered Stop   05/14/19 0900  azithromycin (ZITHROMAX) 500 mg in sodium chloride 0.9 % 250 mL IVPB     500 mg 250 mL/hr over 60 Minutes Intravenous Daily 05/14/19 0830 05/16/19 1104   05/14/19 0500  azithromycin (ZITHROMAX) 500 mg in sodium chloride 0.9 % 250 mL IVPB  Status:  Discontinued     500 mg 250 mL/hr over 60 Minutes Intravenous Daily 05/14/19 0331 05/14/19 0830   05/14/19 0330  cefTRIAXone (ROCEPHIN) 2 g in sodium chloride 0.9 % 100 mL IVPB  Status:  Discontinued      2 g 200 mL/hr over 30 Minutes Intravenous Daily at bedtime 05/14/19 0310 05/16/19 0908       Subjective: Unable to assess given mentation  Objective: Vitals:   05/24/19 0400 05/24/19 0600 05/24/19 0732 05/24/19 0822  BP: 139/67  (!) 144/60   Pulse: 63  64 65  Resp: 18  20 18   Temp:   (!) 97.4 F (36.3 C)   TempSrc:   Oral   SpO2: 100%  100% 100%  Weight:  (!) 206.8 kg    Height:        Intake/Output Summary (Last 24 hours) at 05/24/2019 1541 Last data filed at 05/24/2019 0929 Gross per 24 hour  Intake 480 ml  Output 1250 ml  Net -770 ml   Filed Weights   05/22/19 0412 05/23/19 2142 05/24/19 0600  Weight: 126.6 kg (!) 177.6 kg (!) 206.8 kg    Examination:  General exam: Appears calm and comfortable  Respiratory system: Clear to auscultation. Respiratory effort normal. Cardiovascular system: S1 & S2 heard, RRR Gastrointestinal system: Abdomen is nondistended, soft and nontender. No organomegaly or masses felt. Normal bowel sounds heard. Central nervous system: no tremors. No focal neurological deficits. Extremities: Symmetric 5 x 5 power. Skin: No rashes, lesions  Psychiatry: Difficult to assess given current mentation.   Data Reviewed: I have personally reviewed following labs and imaging studies  CBC: Recent Labs  Lab 05/19/19 2230 05/20/19 0742 05/21/19 0308 05/22/19 0355 05/23/19 0356 05/24/19 0352  WBC 10.3  --  8.2 7.2 6.7 7.9  HGB 6.7* 7.4* 7.3* 7.6* 7.7* 7.8*  HCT 25.1* 27.4* 27.0* 27.5* 28.3* 28.5*  MCV 100.0  --  99.3 98.2 97.6 100.0  PLT 249  --  267 288 281 [id number]   Basic Metabolic Panel: Recent Labs  Lab 05/20/19 0742 05/21/19 0308 05/22/19 0355 05/23/19 0356 05/24/19 0352  NA 143 144 146* 145 149*  K 4.9 4.9 4.9 4.4 4.3  CL 103 103 105 106 108  CO2 29 28 28 29 31   GLUCOSE 100* 97 142* 124* 134*  BUN 127* 134* 135* 123* 116*  CREATININE 4.77* 5.02* 5.08* 4.91* 4.53*  CALCIUM 8.9 9.2  9.4 9.4 9.1 9.1  PHOS 4.8* 5.3* 5.5* 5.0* 4.9*    GFR: Estimated Creatinine Clearance: 20.3 mL/min (A) (by C-G formula based on SCr of 4.53 mg/dL (H)). Liver Function Tests: Recent Labs  Lab 05/20/19 0742 05/21/19 0308 05/22/19 0355 05/23/19 0356 05/24/19 0352  ALBUMIN 2.6* 2.5* 2.7* 2.5* 2.6*   No results for input(s): LIPASE, AMYLASE in the last 168 hours. No results for input(s): AMMONIA in the last 168 hours. Coagulation Profile: Recent Labs  Lab 05/22/19 0355  INR 1.2   Cardiac Enzymes: No results for input(s): CKTOTAL, CKMB, CKMBINDEX, TROPONINI in the last 168 hours. BNP (last 3 results) No results for input(s): PROBNP in the last 8760 hours. HbA1C: No results for input(s): HGBA1C in the last 72 hours. CBG: Recent Labs  Lab 05/23/19 2111 05/24/19 0016 05/24/19 0433 05/24/19 0734 05/24/19 1154  GLUCAP 130* 143* 124* 127* 117*   Lipid Profile: No results for input(s): CHOL, HDL, LDLCALC, TRIG, CHOLHDL, LDLDIRECT in the last 72 hours. Thyroid Function Tests: No results for input(s): TSH, T4TOTAL, FREET4, T3FREE, THYROIDAB in the last 72 hours. Anemia Panel: No results for input(s): VITAMINB12, FOLATE, FERRITIN, TIBC, IRON, RETICCTPCT in the last 72 hours. Sepsis Labs: No results for input(s): PROCALCITON, LATICACIDVEN in the last 168 hours.  Recent Results (from  the past 240 hour(s))  Culture, respiratory (non-expectorated)     Status: None   Collection Time: 05/17/19  1:56 PM   Specimen: Tracheal Aspirate; Respiratory  Result Value Ref Range Status   Specimen Description TRACHEAL ASPIRATE  Final   Special Requests NONE  Final   Gram Stain   Final    RARE WBC PRESENT,BOTH PMN AND MONONUCLEAR NO ORGANISMS SEEN    Culture   Final    RARE Consistent with normal respiratory flora. Performed at [facility], [place] [address] [place],  [address]    Report Status 05/20/2019 FINAL  Final     Radiology Studies: Dg Chest Port 1 View  Result Date: 05/24/2019 CLINICAL DATA:  Shortness of  breath. EXAM: PORTABLE CHEST 1 VIEW COMPARISON:  05/17/2019 FINDINGS: Patient is slightly rotated to the right. Lungs are adequately inflated with suggestion of minimal vascular congestion. Vertical linear density overlying the right side of the cardiomediastinal silhouette likely atelectasis. No lobar consolidation or effusion. Stable cardiomegaly. Remainder of the exam is unchanged. IMPRESSION: Density over the medial right mid to lower lung likely atelectasis. Cardiomegaly and suggestion of mild vascular congestion. Electronically Signed   By: Marin Olp M.D.   On: 05/24/2019 11:33    Scheduled Meds: . amiodarone  200 mg Oral Daily  . amLODipine  10 mg Oral Daily  . bethanechol  5 mg Oral TID  . Chlorhexidine Gluconate Cloth  6 each Topical Q0600  . furosemide  80 mg Intravenous BID  . heparin  5,000 Units Subcutaneous Q8H  . insulin aspart  0-15 Units Subcutaneous Q4H  . levothyroxine  75 mcg Per Tube Q0600  . mouth rinse  15 mL Mouth Rinse BID  . metoprolol tartrate  12.5 mg Oral BID  . Ensure Max Protein  11 oz Oral BID   Continuous Infusions: . sodium chloride Stopped (05/22/19 1552)     LOS: 10 days   Jodi Lund, MD [facility] Pager On Amion  If 7PM-7AM, please contact night-coverage 05/24/2019, 3:41 PM

## 2019-05-24 NOTE — Progress Notes (Signed)
When pt was on regular bed, pt was yelling that her butt hurt; could be heard down the hallway. Pt has now been on bariatric bed for almost two hours - has been sleeping comfortably. The bed was adjusted soft/firm to pt comfort, and was placed on the alternate cycle every 3 minutes. Pt has not complained of pain since the transfer, and bed has been adjusted every time the pt has asked to turn. Pt was given the bed remote to adjust head and feet - continues to sleep comfortably with no further needs.

## 2019-05-24 NOTE — Progress Notes (Signed)
Pt stating all shift that she is uncomfortable - that she just wants to be comfortable - is unable to verbalize or demonstrate how we can help her or with what. Multiple staff members have attempted to reposition her body, bed, and pillows in an attempt to make her comfortable. Pt unsatisfied. "You need to find somebody that is strong to help get me comfortable" - "I have been in a hospital bed for 2 years and I know that I can be comfortable" - pt unable to help direct staff to make her comfortable.   Pt continues to also state that she "just wants to be left alone", but when staff is exiting the room, she starts saying "but where are you going?"  Pt is heard from hallway repositioning herself with no assistance.   Antifungal powder applied under pt's breasts and pannus per pt instruction.

## 2019-05-24 NOTE — Progress Notes (Signed)
Regular bed scale: 177.6 kgs Bariatric bed scale: 456 lbs (206.8 kgs)

## 2019-05-24 NOTE — Progress Notes (Signed)
Daughter said not to give the patient Lasix because it contributed to her kidney problems in the past. She asked that I not give the evening dose. She also asked if we could change her diuretic to Bumex, instead. She asked me to list the Lasix on the patient's allergies so we don't give it again. Dr. Wyline Copas notified. Will update Nephro in AM.

## 2019-05-24 NOTE — Plan of Care (Signed)
  Problem: Activity: Goal: Risk for activity intolerance will decrease Outcome: Not Progressing   

## 2019-05-24 NOTE — Progress Notes (Signed)
Pt had been asking to go back on cpap, but then refusing when this RN enters the room to place mask back on.

## 2019-05-24 NOTE — Progress Notes (Signed)
Extra large SCD's sleeves were ordered at start of shift. Pt has had no complaints - fits well.

## 2019-05-24 NOTE — Progress Notes (Signed)
[facility] NEPHROLOGY PROGRESS NOTE  Assessment/ Plan: Pt is a 73 y.o. yo female with history of obesity, OSA, CKD stage IV admitted with respiratory failure, consulted for AKI on CKD.  #AKI on CKD stage IV: Baseline creatinine level around 3-3.5.  AKI likely hemodynamically mediated.  UA likely contaminant.  Kidney US with no obstruction, right kidney small.  Attempted IV fluid with no significant renal improvement. -Urine output 950 cc.  Patient is more short of breath today concerning for fluid overload.  I will start Lasix 80 mg IV twice a day.  Order a chest x-ray.  Continue strict ins and out.  Creatinine level continue to trend down to 4.5.  On 11/4, I have discussed with the patient and her daughter at length  about worsening kidney function.  As per patient's daughter, the patient will receive dialysis if needed and family will support her.  I am not sure if she can tolerate outpatient HD. - Monitor BMP, urine output.  Avoid nephrotoxins.  #Ventilator dependent respiratory failure, now extubated nasal cannula.  Checking x-ray, currently on oxygen.  #CHF, EF 60 to 65%: Diuretics as above.  #Anemia: Received blood transfusion.  Iron saturation 15%.  Treated with IV iron and ESA.  #Acute encephalopathy, metabolic: Minimize opiates or sedatives.  Patient was alert awake and following commands.  #Hyperphosphatemia: PTH level 39.  Monitor phosphorus level.  #General deconditioning.   Subjective: Seen and examined at bedside.  For shortness of breath.  No chest pain, nausea or vomiting.  She has urine output of around 950 cc.  Objective Vital signs in last 24 hours: Vitals:   05/24/19 0400 05/24/19 0600 05/24/19 0732 05/24/19 0822  BP: 139/67  (!) 144/60   Pulse: 63  64 65  Resp: 18  20 18   Temp:   (!) 97.4 F (36.3 C)   TempSrc:   Oral   SpO2: 100%  100% 100%  Weight:  (!) 206.8 kg    Height:       Weight change:   Intake/Output Summary (Last 24 hours) at  05/24/2019 0836 Last data filed at 05/24/2019 0600 Gross per 24 hour  Intake 720 ml  Output 950 ml  Net -230 ml       Labs: Basic Metabolic Panel: Recent Labs  Lab 05/22/19 0355 05/23/19 0356 05/24/19 0352  NA 146* 145 149*  K 4.9 4.4 4.3  CL 105 106 108  CO2 28 29 31   GLUCOSE 142* 124* 134*  BUN 135* 123* 116*  CREATININE 5.08* 4.91* 4.53*  CALCIUM 9.4 9.1 9.1  PHOS 5.5* 5.0* 4.9*   Liver Function Tests: Recent Labs  Lab 05/22/19 0355 05/23/19 0356 05/24/19 0352  ALBUMIN 2.7* 2.5* 2.6*   No results for input(s): LIPASE, AMYLASE in the last 168 hours. No results for input(s): AMMONIA in the last 168 hours. CBC: Recent Labs  Lab 05/19/19 2230  05/21/19 0308 05/22/19 0355 05/23/19 0356 05/24/19 0352  WBC 10.3  --  8.2 7.2 6.7 7.9  HGB 6.7*   < > 7.3* 7.6* 7.7* 7.8*  HCT 25.1*   < > 27.0* 27.5* 28.3* 28.5*  MCV 100.0  --  99.3 98.2 97.6 100.0  PLT 249  --  267 288 281 292   < > = values in this interval not displayed.   Cardiac Enzymes: No results for input(s): CKTOTAL, CKMB, CKMBINDEX, TROPONINI in the last 168 hours. CBG: Recent Labs  Lab 05/23/19 1701 05/23/19 2111 05/24/19 0016 05/24/19 0433 05/24/19 [id number]  GLUCAP 105* 130* 143* 124* 127*    Iron Studies:  No results for input(s): IRON, TIBC, TRANSFERRIN, FERRITIN in the last 72 hours. Studies/Results: No results found.  Medications: Infusions: . sodium chloride Stopped (05/22/19 1552)    Scheduled Medications: . amiodarone  200 mg Oral Daily  . amLODipine  10 mg Oral Daily  . bethanechol  5 mg Oral TID  . Chlorhexidine Gluconate Cloth  6 each Topical Q0600  . furosemide  80 mg Intravenous BID  . heparin  5,000 Units Subcutaneous Q8H  . insulin aspart  0-15 Units Subcutaneous Q4H  . levothyroxine  75 mcg Per Tube Q0600  . mouth rinse  15 mL Mouth Rinse BID  . metoprolol tartrate  12.5 mg Oral BID  . Ensure Max Protein  11 oz Oral BID    have reviewed scheduled and prn  medications.  Physical Exam: General: Not in distress, lying on bed Heart:RRR, s1s2 nl, no rubs Lungs: Bilateral crackles, no increased work of breathing Abdomen:soft, Non-tender, distended with marked obesity no abdominal wall edema Extremities: Bilateral lymphedema and worsen edema compared to yesterday. Neurology: Alert awake and following simple commands  Jodi Underwood 05/24/2019,8:36 AM  LOS: 10 days  Pager: ID:[phone]

## 2019-05-25 DIAGNOSIS — D539 Nutritional anemia, unspecified: Secondary | ICD-10-CM

## 2019-05-25 LAB — CBC
HCT: 29.3 % — ABNORMAL LOW (ref 36.0–46.0)
Hemoglobin: 8.1 g/dL — ABNORMAL LOW (ref 12.0–15.0)
MCH: 27.4 pg (ref 26.0–34.0)
MCHC: 27.6 g/dL — ABNORMAL LOW (ref 30.0–36.0)
MCV: 99 fL (ref 80.0–100.0)
Platelets: 309 10*3/uL (ref 150–400)
RBC: 2.96 MIL/uL — ABNORMAL LOW (ref 3.87–5.11)
RDW: 14.5 % (ref 11.5–15.5)
WBC: 9.4 10*3/uL (ref 4.0–10.5)
nRBC: 0.4 % — ABNORMAL HIGH (ref 0.0–0.2)

## 2019-05-25 LAB — RENAL FUNCTION PANEL
Albumin: 2.7 g/dL — ABNORMAL LOW (ref 3.5–5.0)
Anion gap: 13 (ref 5–15)
BUN: 109 mg/dL — ABNORMAL HIGH (ref 8–23)
CO2: 29 mmol/L (ref 22–32)
Calcium: 9.2 mg/dL (ref 8.9–10.3)
Chloride: 105 mmol/L (ref 98–111)
Creatinine, Ser: 4.25 mg/dL — ABNORMAL HIGH (ref 0.44–1.00)
GFR calc Af Amer: 11 mL/min — ABNORMAL LOW (ref >60)
GFR calc non Af Amer: 10 mL/min — ABNORMAL LOW (ref >60)
Glucose, Bld: 143 mg/dL — ABNORMAL HIGH (ref 70–99)
Phosphorus: 4.1 mg/dL (ref 2.5–4.6)
Potassium: 4 mmol/L (ref 3.5–5.1)
Sodium: 147 mmol/L — ABNORMAL HIGH (ref 135–145)

## 2019-05-25 LAB — GLUCOSE, CAPILLARY
Glucose-Capillary: 114 mg/dL — ABNORMAL HIGH (ref 70–99)
Glucose-Capillary: 122 mg/dL — ABNORMAL HIGH (ref 70–99)
Glucose-Capillary: 127 mg/dL — ABNORMAL HIGH (ref 70–99)
Glucose-Capillary: 137 mg/dL — ABNORMAL HIGH (ref 70–99)
Glucose-Capillary: 145 mg/dL — ABNORMAL HIGH (ref 70–99)
Glucose-Capillary: 148 mg/dL — ABNORMAL HIGH (ref 70–99)
Glucose-Capillary: 150 mg/dL — ABNORMAL HIGH (ref 70–99)

## 2019-05-25 MED ORDER — BUMETANIDE 2 MG PO TABS
2.0000 mg | ORAL_TABLET | Freq: Two times a day (BID) | ORAL | Status: DC
  Administered 2019-05-25 – 2019-05-31 (×14): 2 mg via ORAL
  Filled 2019-05-25 (×14): qty 1

## 2019-05-25 MED ORDER — DIPHENHYDRAMINE HCL 50 MG/ML IJ SOLN
25.0000 mg | Freq: Three times a day (TID) | INTRAMUSCULAR | Status: DC | PRN
  Administered 2019-05-25: 25 mg via INTRAVENOUS
  Administered 2019-05-25: 16:00:00 via INTRAVENOUS
  Administered 2019-05-26: 25 mg via INTRAVENOUS
  Filled 2019-05-25 (×3): qty 1

## 2019-05-25 NOTE — Progress Notes (Signed)
[facility] NEPHROLOGY PROGRESS NOTE  Assessment/ Plan: Pt is a 73 y.o. yo female with history of obesity, OSA, CKD stage IV admitted with respiratory failure, consulted for AKI on CKD.  #AKI on CKD stage IV: Baseline creatinine level around 3-3.5.  AKI likely hemodynamically mediated.  UA likely contaminant.  Kidney US with no obstruction, right kidney small.  Attempted IV fluid with no significant renal improvement. -Patient with fluid overload yesterday.  Received IV Lasix with robust urine output.  Subjective status is much better today.  BUN and creatinine level trending down, CR 4.2.  Apparently, family refused Lasix to the patient.  So far she has no allergic reaction.  I will switch to Bumex twice a day today. - Monitor BMP, urine output.  Avoid nephrotoxins.  #Ventilator dependent respiratory failure, now extubated nasal cannula.  Chest x-ray with pulmonary edema.  #CHF, EF 60 to 65%: Diuretics as above.  #Anemia: Received blood transfusion.  Iron saturation 15%.  Treated with IV iron and ESA.  Hemoglobin is stable.  #Acute encephalopathy, metabolic: Minimize opiates or sedatives.  Patient was alert awake and following commands.  #Hyperphosphatemia: PTH level 39.  Monitor phosphorus level.  #General deconditioning.   Subjective: Seen and examined at bedside.  Shortness of breath is much better after diuretics.  Denied nausea, vomiting, chest pain, headache.  Urine output 3400 cc.  As per nurse there was some leakage from the Foley catheter so the true output is even higher.  Objective Vital signs in last 24 hours: Vitals:   05/24/19 1955 05/24/19 2245 05/24/19 2257 05/25/19 0723  BP:   (!) 157/54 (!) 156/58  Pulse: 65 73 69 70  Resp: 18 18 16 20   Temp:   97.9 F (36.6 C) (!) 97.5 F (36.4 C)  TempSrc:   Oral Oral  SpO2: 98% 100% 94% 100%  Weight:      Height:       Weight change:   Intake/Output Summary (Last 24 hours) at 05/25/2019 0921 Last data  filed at 05/25/2019 0258 Gross per 24 hour  Intake 240 ml  Output 3400 ml  Net -3160 ml       Labs: Basic Metabolic Panel: Recent Labs  Lab 05/23/19 0356 05/24/19 0352 05/25/19 0448  NA 145 149* 147*  K 4.4 4.3 4.0  CL 106 108 105  CO2 29 31 29   GLUCOSE 124* 134* 143*  BUN 123* 116* 109*  CREATININE 4.91* 4.53* 4.25*  CALCIUM 9.1 9.1 9.2  PHOS 5.0* 4.9* 4.1   Liver Function Tests: Recent Labs  Lab 05/23/19 0356 05/24/19 0352 05/25/19 0448  ALBUMIN 2.5* 2.6* 2.7*   No results for input(s): LIPASE, AMYLASE in the last 168 hours. No results for input(s): AMMONIA in the last 168 hours. CBC: Recent Labs  Lab 05/21/19 0308 05/22/19 0355 05/23/19 0356 05/24/19 0352 05/25/19 0448  WBC 8.2 7.2 6.7 7.9 9.4  HGB 7.3* 7.6* 7.7* 7.8* 8.1*  HCT 27.0* 27.5* 28.3* 28.5* 29.3*  MCV 99.3 98.2 97.6 100.0 99.0  PLT 267 288 281 292 309   Cardiac Enzymes: No results for input(s): CKTOTAL, CKMB, CKMBINDEX, TROPONINI in the last 168 hours. CBG: Recent Labs  Lab 05/24/19 1919 05/24/19 2251 05/25/19 0255 05/25/19 0458 05/25/19 0726  GLUCAP 107* 158* 137* 148* 150*    Iron Studies:  No results for input(s): IRON, TIBC, TRANSFERRIN, FERRITIN in the last 72 hours. Studies/Results: Dg Chest Port 1 View  Result Date: 05/24/2019 CLINICAL DATA:  Shortness of breath. EXAM:  PORTABLE CHEST 1 VIEW COMPARISON:  05/17/2019 FINDINGS: Patient is slightly rotated to the right. Lungs are adequately inflated with suggestion of minimal vascular congestion. Vertical linear density overlying the right side of the cardiomediastinal silhouette likely atelectasis. No lobar consolidation or effusion. Stable cardiomegaly. Remainder of the exam is unchanged. IMPRESSION: Density over the medial right mid to lower lung likely atelectasis. Cardiomegaly and suggestion of mild vascular congestion. Electronically Signed   By: Marin Olp M.D.   On: 05/24/2019 11:33    Medications: Infusions: . sodium  chloride Stopped (05/22/19 1552)    Scheduled Medications: . amiodarone  200 mg Oral Daily  . amLODipine  10 mg Oral Daily  . bethanechol  5 mg Oral TID  . bumetanide  2 mg Oral BID  . Chlorhexidine Gluconate Cloth  6 each Topical Q0600  . heparin  5,000 Units Subcutaneous Q8H  . insulin aspart  0-15 Units Subcutaneous Q4H  . levothyroxine  75 mcg Per Tube Q0600  . mouth rinse  15 mL Mouth Rinse BID  . metoprolol tartrate  12.5 mg Oral BID  . Ensure Max Protein  11 oz Oral BID    have reviewed scheduled and prn medications.  Physical Exam: General: Not in distress, lying on bed Heart:RRR, s1s2 nl, no rubs Lungs: Bilateral crackles, no increased work of breathing Abdomen:soft, Non-tender, distended with marked obesity no abdominal wall edema Extremities: Bilateral lymphedema and the edema is somewhat better today Neurology: Alert awake and following simple commands  Ikeya Brockel [name] 05/25/2019,9:21 AM  LOS: 11 days  Pager: ID:[phone]

## 2019-05-25 NOTE — Progress Notes (Signed)
No urine able to be recorded from overnight due to urometer bag leak. Patient had a large amount of urine leak out onto the floor under her bed.

## 2019-05-25 NOTE — Progress Notes (Signed)
PROGRESS NOTE    Jodi Underwood  [id number] DOB: 10/03/1945 DOA: 05/13/2019 PCP: Jodi Marble, MD    Brief Narrative:  73 year old female with history significant for obesity, OSA, CKDadmitted with acute hypercarbic respiratory failure requiring intubation likely r/t CPAP noncompliance c/b decompensated diastolic heart failure. Improving slowly with aggressive diuresis, vent support, HR control. 12/28 Admit to PCCM Intubated 05/14/2019 >>>> extubated 05/19/2019  Assessment & Plan:   Active Problems:   Respiratory failure (HCC)   Acute respiratory failure with hypoxia and hypercarbia (HCC)   Chest pain   Renal insufficiency   Shortness of breath   Decubitus ulcer of coccygeal region, unspecified pressure ulcer stage  Acute hypoxic and hypercarbic respiratory failure requiring mechanical ventilation.  -currently remains on 4 L of oxygen with appropriate O2 sats -Suspect this is secondary to volume overload with obesity hypoventilation and noncompliant CPAP use , bicarbonate is high suggesting obesity hypoventilation syndrome -Continue supplemental O2 as needed  -Pulmonary hygiene  -Mobilize -with aggressive PT OT qhs CPAP mandatory - needs ongoing discussions regarding importance of this -She remains noncompliant with positive pressure ventillation  AKI on Stage III CKD-suspect cardiorenal syndrome. (Baseline Scr ~3-3.5) -Renal ultrasound without hydronephrosis.  -Nephrology continues to follow -Pt clinically volume overloaded, had tolerated lasix, however family claims pt is intolerant, thus now on bumex -Good urine output thus far At this time patient remains a poor candidate for outpatient hemodialysis -Recheck BMET in AM  Acute on chronic heart failure-HFpEFEF 60-65%/A. fib/  -LVEF 60 to 65%, elevated LVEDP, diastolic dysfunction grade II. continueamiodarone, metoprolol -Remains stable at this time  Hypertension -would recent  hypovolemia/hypotension -Remained stable, elevated blood pressure this a.m. -Currently on metoprolol -Titrating meds including metoprolol and Norvasc for better blood pressure control -Currently stable  Type II  diabetes, A1c 6.7- --diet-controlled per daughter.  continueLantus 13 units daily  -Checking blood sugar QA CHS with SSI -Glycemic trends remain stable  Hypothyroidism;  -TSH 9.2 at admission Continue home synthroid75 mcg daily -Remains presently   Acute encephalopathy -suspect metabolic/delirium.  Azotemia -Mentation continues to improve, alert and oriented x2 today -Continue supportive care, physical therapy -Continue seroquelas pt tolerates  Generalized Deconditioning -- per daughter pt lives with family and is mentally very sharp, but is afraid of falling so rarely gets out of the chair, they have PT coming once per week -Therapy recs for SNF, pending dispo when more medically stable  Prolonged QTc due to meds-resolved -Continue Seroquel; be cautious starting other QTC prolonging meds -Cont to monitor at this time  Severe lower extremity edema exacerbated by lymphedema, -Cont with diuresis per Nephrology -Follow BMET trends  DVT prophylaxis: Heparin subQ Code Status: Full Family Communication: Pt in room, family not at bedside Disposition Plan: Uncertain at this time  Consultants:   PCCM, Nephrology, ENT  Procedures:     Antimicrobials: Anti-infectives (From admission, onward)   Start     Dose/Rate Route Frequency Ordered Stop   05/14/19 0900  azithromycin (ZITHROMAX) 500 mg in sodium chloride 0.9 % 250 mL IVPB     500 mg 250 mL/hr over 60 Minutes Intravenous Daily 05/14/19 0830 05/16/19 1104   05/14/19 0500  azithromycin (ZITHROMAX) 500 mg in sodium chloride 0.9 % 250 mL IVPB  Status:  Discontinued     500 mg 250 mL/hr over 60 Minutes Intravenous Daily 05/14/19 0331 05/14/19 0830   05/14/19 0330  cefTRIAXone (ROCEPHIN) 2 g in sodium  chloride 0.9 % 100 mL IVPB  Status:  Discontinued  2 g 200 mL/hr over 30 Minutes Intravenous Daily at bedtime 05/14/19 0310 05/16/19 0908      Subjective: Complaining of feeling uncomfortable in bariatric bed  Objective: Vitals:   05/24/19 1955 05/24/19 2245 05/24/19 2257 05/25/19 0723  BP:   (!) 157/54 (!) 156/58  Pulse: 65 73 69 70  Resp: 18 18 16 20   Temp:   97.9 F (36.6 C) (!) 97.5 F (36.4 C)  TempSrc:   Oral Oral  SpO2: 98% 100% 94% 100%  Weight:      Height:        Intake/Output Summary (Last 24 hours) at 05/25/2019 1453 Last data filed at 05/25/2019 0258 Gross per 24 hour  Intake 0 ml  Output 2350 ml  Net -2350 ml   Filed Weights   05/22/19 0412 05/23/19 2142 05/24/19 0600  Weight: 126.6 kg (!) 177.6 kg (!) 206.8 kg    Examination: General exam: Awake, laying in bed, appears uncomfortable Respiratory system: Normal respiratory effort, no wheezing Cardiovascular system: regular rate, s1, s2 Gastrointestinal system: Soft, nondistended, positive BS Central nervous system: CN2-12 grossly intact, strength intact Extremities: Perfused, no clubbing Skin: Normal skin turgor, no notable skin lesions seen Psychiatry: Mood normal // no visual hallucinations   Data Reviewed: I have personally reviewed following labs and imaging studies  CBC: Recent Labs  Lab 05/21/19 0308 05/22/19 0355 05/23/19 0356 05/24/19 0352 05/25/19 0448  WBC 8.2 7.2 6.7 7.9 9.4  HGB 7.3* 7.6* 7.7* 7.8* 8.1*  HCT 27.0* 27.5* 28.3* 28.5* 29.3*  MCV 99.3 98.2 97.6 100.0 99.0  PLT 267 288 281 292 [id number]   Basic Metabolic Panel: Recent Labs  Lab 05/21/19 0308 05/22/19 0355 05/23/19 0356 05/24/19 0352 05/25/19 0448  NA 144 146* 145 149* 147*  K 4.9 4.9 4.4 4.3 4.0  CL 103 105 106 108 105  CO2 28 28 29 31 29   GLUCOSE 97 142* 124* 134* 143*  BUN 134* 135* 123* 116* 109*  CREATININE 5.02* 5.08* 4.91* 4.53* 4.25*  CALCIUM 9.2  9.4 9.4 9.1 9.1 9.2  PHOS 5.3* 5.5* 5.0* 4.9* 4.1    GFR: Estimated Creatinine Clearance: 21.7 mL/min (A) (by C-G formula based on SCr of 4.25 mg/dL (H)). Liver Function Tests: Recent Labs  Lab 05/21/19 0308 05/22/19 0355 05/23/19 0356 05/24/19 0352 05/25/19 0448  ALBUMIN 2.5* 2.7* 2.5* 2.6* 2.7*   No results for input(s): LIPASE, AMYLASE in the last 168 hours. No results for input(s): AMMONIA in the last 168 hours. Coagulation Profile: Recent Labs  Lab 05/22/19 0355  INR 1.2   Cardiac Enzymes: No results for input(s): CKTOTAL, CKMB, CKMBINDEX, TROPONINI in the last 168 hours. BNP (last 3 results) No results for input(s): PROBNP in the last 8760 hours. HbA1C: No results for input(s): HGBA1C in the last 72 hours. CBG: Recent Labs  Lab 05/24/19 2251 05/25/19 0255 05/25/19 0458 05/25/19 0726 05/25/19 1143  GLUCAP 158* 137* 148* 150* 145*   Lipid Profile: No results for input(s): CHOL, HDL, LDLCALC, TRIG, CHOLHDL, LDLDIRECT in the last 72 hours. Thyroid Function Tests: No results for input(s): TSH, T4TOTAL, FREET4, T3FREE, THYROIDAB in the last 72 hours. Anemia Panel: No results for input(s): VITAMINB12, FOLATE, FERRITIN, TIBC, IRON, RETICCTPCT in the last 72 hours. Sepsis Labs: No results for input(s): PROCALCITON, LATICACIDVEN in the last 168 hours.  Recent Results (from the past 240 hour(s))  Culture, respiratory (non-expectorated)     Status: None   Collection Time: 05/17/19  1:56 PM   Specimen: Tracheal Aspirate; Respiratory  Result Value Ref Range Status   Specimen Description TRACHEAL ASPIRATE  Final   Special Requests NONE  Final   Gram Stain   Final    RARE WBC PRESENT,BOTH PMN AND MONONUCLEAR NO ORGANISMS SEEN    Culture   Final    RARE Consistent with normal respiratory flora. Performed at [facility], [place] [address] [place], [place] [address]    Report Status 05/20/2019 FINAL  Final     Radiology Studies: Dg Chest Port 1 View  Result Date: 05/24/2019 CLINICAL DATA:  Shortness of breath.  EXAM: PORTABLE CHEST 1 VIEW COMPARISON:  05/17/2019 FINDINGS: Patient is slightly rotated to the right. Lungs are adequately inflated with suggestion of minimal vascular congestion. Vertical linear density overlying the right side of the cardiomediastinal silhouette likely atelectasis. No lobar consolidation or effusion. Stable cardiomegaly. Remainder of the exam is unchanged. IMPRESSION: Density over the medial right mid to lower lung likely atelectasis. Cardiomegaly and suggestion of mild vascular congestion. Electronically Signed   By: Marin Olp M.D.   On: 05/24/2019 11:33    Scheduled Meds: . amiodarone  200 mg Oral Daily  . amLODipine  10 mg Oral Daily  . bethanechol  5 mg Oral TID  . bumetanide  2 mg Oral BID  . Chlorhexidine Gluconate Cloth  6 each Topical Q0600  . heparin  5,000 Units Subcutaneous Q8H  . insulin aspart  0-15 Units Subcutaneous Q4H  . levothyroxine  75 mcg Per Tube Q0600  . mouth rinse  15 mL Mouth Rinse BID  . metoprolol tartrate  12.5 mg Oral BID  . Ensure Max Protein  11 oz Oral BID   Continuous Infusions: . sodium chloride Stopped (05/22/19 1552)     LOS: 11 days   Marylu Lund, MD [facility] Pager On Amion  If 7PM-7AM, please contact night-coverage 05/25/2019, 2:53 PM

## 2019-05-25 NOTE — Progress Notes (Signed)
Patient on CPAP machine with 5L O2 bleed in.  Vitals stable RT will continue to monitor.

## 2019-05-26 LAB — CBC
HCT: 30.1 % — ABNORMAL LOW (ref 36.0–46.0)
Hemoglobin: 8.2 g/dL — ABNORMAL LOW (ref 12.0–15.0)
MCH: 27.1 pg (ref 26.0–34.0)
MCHC: 27.2 g/dL — ABNORMAL LOW (ref 30.0–36.0)
MCV: 99.3 fL (ref 80.0–100.0)
Platelets: 301 10*3/uL (ref 150–400)
RBC: 3.03 MIL/uL — ABNORMAL LOW (ref 3.87–5.11)
RDW: 14.2 % (ref 11.5–15.5)
WBC: 9.9 10*3/uL (ref 4.0–10.5)
nRBC: 0 % (ref 0.0–0.2)

## 2019-05-26 LAB — RENAL FUNCTION PANEL
Albumin: 2.7 g/dL — ABNORMAL LOW (ref 3.5–5.0)
Anion gap: 12 (ref 5–15)
BUN: 97 mg/dL — ABNORMAL HIGH (ref 8–23)
CO2: 32 mmol/L (ref 22–32)
Calcium: 9.1 mg/dL (ref 8.9–10.3)
Chloride: 101 mmol/L (ref 98–111)
Creatinine, Ser: 4.06 mg/dL — ABNORMAL HIGH (ref 0.44–1.00)
GFR calc Af Amer: 12 mL/min — ABNORMAL LOW (ref >60)
GFR calc non Af Amer: 10 mL/min — ABNORMAL LOW (ref >60)
Glucose, Bld: 132 mg/dL — ABNORMAL HIGH (ref 70–99)
Phosphorus: 4.1 mg/dL (ref 2.5–4.6)
Potassium: 3.9 mmol/L (ref 3.5–5.1)
Sodium: 145 mmol/L (ref 135–145)

## 2019-05-26 LAB — GLUCOSE, CAPILLARY
Glucose-Capillary: 136 mg/dL — ABNORMAL HIGH (ref 70–99)
Glucose-Capillary: 121 mg/dL — ABNORMAL HIGH (ref 70–99)
Glucose-Capillary: 157 mg/dL — ABNORMAL HIGH (ref 70–99)
Glucose-Capillary: 155 mg/dL — ABNORMAL HIGH (ref 70–99)
Glucose-Capillary: 124 mg/dL — ABNORMAL HIGH (ref 70–99)

## 2019-05-26 MED ORDER — CARBAMIDE PEROXIDE 6.5 % OT SOLN
5.0000 [drp] | Freq: Two times a day (BID) | OTIC | Status: DC
  Administered 2019-05-26 – 2019-05-27 (×2): 5 [drp] via OTIC
  Filled 2019-05-26: qty 15

## 2019-05-26 MED ORDER — NYSTATIN 100000 UNIT/GM EX POWD
Freq: Three times a day (TID) | CUTANEOUS | Status: DC
  Administered 2019-05-26 – 2019-05-28 (×3): via TOPICAL
  Filled 2019-05-26: qty 15

## 2019-05-26 NOTE — Progress Notes (Signed)
Occupational Therapy Treatment Patient Details Name: Jodi Underwood MRN: [id number] DOB: 27-Jun-1946 Today's Date: 05/26/2019    History of present illness 73 year old female with past medical history of morbid obesity, OSA, hypertension, type 2 diabetes, atrial fibrillation,  Asthma, CKD stage IV, heart failure, hypothyroidism who presents with 1 week of shortness of breath, cough and chest pressure.  Patient intubated in the ED and admitted to ICU10/28/20. acute hypercarbic respiratory failure likely r/t CPAP noncompliance c/b decompensated diastolic heart failure Extubated 05/19/19.    OT comments  Pt demonstrating increased arousal and engagement this session. Pt requiring Max A for UB bathing at bed level. Pt requiring Total A for peri care at bed level and Max A +3 for rolling. Use of Maxi-move for transfer to recliner. SpO2 >90% on 5L throughout session. Pt benefiting from calm cues for purse lip breathing. Continue to recommend dc to SNF and will continue to follow acutely as admitted.    Follow Up Recommendations  SNF;Supervision/Assistance - 24 hour    Equipment Recommendations  Other (comment)(Defer to next venue)    Recommendations for Other Services PT consult    Precautions / Restrictions Precautions Precautions: Fall Precaution Comments: family reports multiple falls, causing fear of movement Restrictions Weight Bearing Restrictions: No       Mobility Bed Mobility Overal bed mobility: Needs Assistance Bed Mobility: Rolling Rolling: Max assist;+2 for physical assistance;+2 for safety/equipment         General bed mobility comments: Pt reaching towards bedrail and then pulling torso to sidelying with Max A to bring BLEs over and faciltiate trunk.  Transfers Overall transfer level: Needs assistance               General transfer comment: Use of list to transfer to recliner    Balance                                           ADL  either performed or assessed with clinical judgement   ADL Overall ADL's : Needs assistance/impaired         Upper Body Bathing: Maximal assistance;Bed level Upper Body Bathing Details (indicate cue type and reason): Requiring Max A for UB bathing at bed level due to weakness and poor ROM.              Toilet Transfer: Total assistance Toilet Transfer Details (indicate cue type and reason): Use of lift to transfer to [place] and Hygiene: Maximal assistance;+2 for physical assistance;Bed level;Total assistance;+2 for safety/equipment Toileting - Clothing Manipulation Details (indicate cue type and reason): Max A to roll with +2. Third person to perform peri care after small BM in bed; total A.      Functional mobility during ADLs: Total assistance(use of lift) General ADL Comments: Pt with increased engagement this session. Pt requiring Max-Total A +3 for rolling in bed to complete toilet hygiene. Participating in lift ot recliner with Maxi-move. Requriing cues for calm purse lip breathing as pt become axnious about movement easily.      Vision       Perception     Praxis      Cognition Arousal/Alertness: Awake/alert Behavior During Therapy: Anxious;Flat affect Overall Cognitive Status: Impaired/Different from baseline Area of Impairment: Attention;Following commands;Memory;Safety/judgement;Awareness;Problem solving                   Current Attention Level: Focused  Memory: Decreased short-term memory Following Commands: Follows one step commands inconsistently;Follows one step commands with increased time;Follows multi-step commands inconsistently;Follows multi-step commands with increased time Safety/Judgement: Decreased awareness of safety;Decreased awareness of deficits Awareness: Intellectual Problem Solving: Slow processing;Decreased initiation;Difficulty sequencing;Requires verbal cues;Requires tactile cues General Comments:  pt much more alert and participatory today, continues to have slow processing however could be her [facility]        Exercises     Shoulder Instructions       General Comments Pt on 5L O2 via HFNC, SaO2 >90% during mobility    Pertinent Vitals/ Pain       Pain Assessment: Faces Faces Pain Scale: Hurts even more Pain Location: L knee, L shoulder Pain Descriptors / Indicators: Aching;Discomfort;Grimacing;Moaning;Sore Pain Intervention(s): Monitored during session;Limited activity within patient's tolerance;Repositioned  Home Living                                          Prior Functioning/Environment              Frequency  Min 2X/week        Progress Toward Goals  OT Goals(current goals can now be found in the care plan section)  Progress towards OT goals: Progressing toward goals  Acute Rehab OT Goals Patient Stated Goal: go back to sleep OT Goal Formulation: With patient Time For Goal Achievement: 06/04/19 Potential to Achieve Goals: Good ADL Goals Pt Will Perform Grooming: with mod assist;sitting Pt Will Perform Upper Body Dressing: with mod assist;sitting Pt Will Perform Lower Body Dressing: with max assist;sit to/from stand Pt Will Transfer to Toilet: with max assist;squat pivot transfer;with +2 assist;bedside commode Pt Will Perform Toileting - Clothing Manipulation and hygiene: with max assist;sitting/lateral leans  Plan Discharge plan remains appropriate    Co-evaluation    PT/OT/SLP Co-Evaluation/Treatment: Yes Reason for Co-Treatment: Complexity of the patient's impairments (multi-system involvement);For patient/therapist safety;To address functional/ADL transfers   OT goals addressed during session: ADL's and self-care      AM-PAC OT "6 Clicks" Daily Activity     Outcome Measure   Help from another person eating meals?: A Lot Help from another person taking care of personal grooming?: A Lot Help from another person toileting,  which includes using toliet, bedpan, or urinal?: Total Help from another person bathing (including washing, rinsing, drying)?: Total Help from another person to put on and taking off regular upper body clothing?: Total Help from another person to put on and taking off regular lower body clothing?: Total 6 Click Score: 8    End of Session Equipment Utilized During Treatment: Oxygen(5L)  OT Visit Diagnosis: Pain;Cognitive communication deficit (R41.841);Muscle weakness (generalized) (M62.81);Repeated falls (R29.6);History of falling (Z91.81);Other symptoms and signs involving cognitive function Pain - part of body: Shoulder;Knee   Activity Tolerance Patient limited by fatigue   Patient Left in chair;with call bell/phone within reach   Nurse Communication Mobility status        Time: [id number] OT Time Calculation (min): 41 min  Charges: OT General Charges $OT Visit: 1 Visit OT Treatments $Self Care/Home Management : 23-37 mins  [place], OTR/L Acute Rehab Pager: [phone] Office: [place] 05/26/2019, 1:38 PM

## 2019-05-26 NOTE — Progress Notes (Signed)
Pt at first refusing care but explained that she was itching; antifungal powder applied to underarms and skin folds, towels placed, and IV benedryl given. Redness and irritation under arms and in skin folds. Pt refused cpap stating that she was itching and wanted to watch [employer]GT[employer] - states that she is not ready at the moment, but will be later.

## 2019-05-26 NOTE — Progress Notes (Signed)
[facility] NEPHROLOGY PROGRESS NOTE  Assessment/ Plan: Pt is a 73 y.o. yo female with history of morbid obesity, OSA, CKD stage IV admitted with respiratory failure, consulted for AKI on CKD.  #AKI on CKD stage IV: Baseline creatinine level around 3-3.5.  AKI likely hemodynamically mediated.  UA likely contaminant.  Kidney US with no obstruction, right kidney small.  With diuresis, BUN and creatinine level trending down, continue Bumex, patient and family do not wish to receive furosemide. - Monitor BMP, urine output.  Avoid nephrotoxins.  #Ventilator dependent respiratory failure, now extubated nasal cannula.  Chest x-ray with pulmonary edema.  As above, continue diuretics  #CHF, EF 60 to 65%: Diuretics as above.  #Anemia: Received blood transfusion.  Iron saturation 15%.  Treated with IV iron and ESA.  Hemoglobin is stable.  #Acute encephalopathy, metabolic: Minimize opiates or sedatives.  Patient alert awake and following commands.  #Hyperphosphatemia: PTH level 39.  Monitor phosphorus level.  #General deconditioning.   Subjective:  Stable, creatinine and BUN downtrending.  1.8 L urine output yesterday, potassium stable.  Objective Vital signs in last 24 hours: Vitals:   05/26/19 0420 05/26/19 0731 05/26/19 0825 05/26/19 0920  BP:  (!) 173/58 (!) 199/63 (!) 186/69  Pulse:  68 70 72  Resp:   19   Temp:  97.6 F (36.4 C) (!) 97.5 F (36.4 C)   TempSrc:  Oral Oral   SpO2: 100% 96% 98% 100%  Weight:      Height:       Weight change:   Intake/Output Summary (Last 24 hours) at 05/26/2019 1535 Last data filed at 05/26/2019 1500 Gross per 24 hour  Intake 870 ml  Output 2850 ml  Net -1980 ml       Labs: Basic Metabolic Panel: Recent Labs  Lab 05/24/19 0352 05/25/19 0448 05/26/19 0435  NA 149* 147* 145  K 4.3 4.0 3.9  CL 108 105 101  CO2 31 29 32  GLUCOSE 134* 143* 132*  BUN 116* 109* 97*  CREATININE 4.53* 4.25* 4.06*  CALCIUM 9.1 9.2 9.1   PHOS 4.9* 4.1 4.1   Liver Function Tests: Recent Labs  Lab 05/24/19 0352 05/25/19 0448 05/26/19 0435  ALBUMIN 2.6* 2.7* 2.7*   No results for input(s): LIPASE, AMYLASE in the last 168 hours. No results for input(s): AMMONIA in the last 168 hours. CBC: Recent Labs  Lab 05/22/19 0355 05/23/19 0356 05/24/19 0352 05/25/19 0448 05/26/19 0435  WBC 7.2 6.7 7.9 9.4 9.9  HGB 7.6* 7.7* 7.8* 8.1* 8.2*  HCT 27.5* 28.3* 28.5* 29.3* 30.1*  MCV 98.2 97.6 100.0 99.0 99.3  PLT 288 281 292 309 301   Cardiac Enzymes: No results for input(s): CKTOTAL, CKMB, CKMBINDEX, TROPONINI in the last 168 hours. CBG: Recent Labs  Lab 05/25/19 1915 05/25/19 2331 05/26/19 0309 05/26/19 0731 05/26/19 1209  GLUCAP 127* 114* 136* 121* 157*    Iron Studies:  No results for input(s): IRON, TIBC, TRANSFERRIN, FERRITIN in the last 72 hours. Studies/Results: No results found.  Medications: Infusions: . sodium chloride Stopped (05/22/19 1552)    Scheduled Medications: . amiodarone  200 mg Oral Daily  . amLODipine  10 mg Oral Daily  . bethanechol  5 mg Oral TID  . bumetanide  2 mg Oral BID  . carbamide peroxide  5 drop Both EARS BID  . Chlorhexidine Gluconate Cloth  6 each Topical Q0600  . heparin  5,000 Units Subcutaneous Q8H  . insulin aspart  0-15 Units Subcutaneous Q4H  .  levothyroxine  75 mcg Per Tube Q0600  . mouth rinse  15 mL Mouth Rinse BID  . metoprolol tartrate  12.5 mg Oral BID  . Ensure Max Protein  11 oz Oral BID    have reviewed scheduled and prn medications.  Physical Exam: General: Not in distress, lying on bed Heart:RRR, s1s2 nl, no rubs Lungs: Bilateral crackles, no increased work of breathing Abdomen:soft, Non-tender, distended with marked obesity no abdominal wall edema Extremities: Bilateral lymphedema and the edema is somewhat better today Neurology: Alert awake and following simple commands  Shauntay Brunelli B Genni Buske 05/26/2019,3:35 PM  LOS: 12 days

## 2019-05-26 NOTE — Progress Notes (Signed)
Physical Therapy Treatment Patient Details Name: Jodi Underwood MRN: [id number] DOB: 1945-07-21 Today's Date: 05/26/2019    History of Present Illness 73 year old female with past medical history of morbid obesity, OSA, hypertension, type 2 diabetes, atrial fibrillation,  Asthma, CKD stage IV, heart failure, hypothyroidism who presents with 1 week of shortness of breath, cough and chest pressure.  Patient intubated in the ED and admitted to ICU10/28/20. acute hypercarbic respiratory failure likely r/t CPAP noncompliance c/b decompensated diastolic heart failure Extubated 05/19/19.     PT Comments    Pt supine in bed, with some encouragement pt agrees to use Maximove to lift to chair. Pt reports being soiled and requires assistx4 for mobility, cleaning and pad placement. Maximoved to chair. PT continues to recommend SNF level care at d/c. PT will continue to follow acutely.  Follow Up Recommendations  SNF     Equipment Recommendations  Other (comment)(TBD at next venue)    Recommendations for Other Services       Precautions / Restrictions Precautions Precautions: Fall Precaution Comments: family reports multiple falls, causing fear of movement Restrictions Weight Bearing Restrictions: No    Mobility  Bed Mobility Overal bed mobility: Needs Assistance Bed Mobility: Rolling Rolling: Total assist;+2 for physical assistance;+2 for safety/equipment         General bed mobility comments: Pt able to provide crossbody reach in rolling R and L requires total Ax4  for LE movement for cleaning and pad placement           Cognition Arousal/Alertness: Awake/alert Behavior During Therapy: Anxious;Flat affect Overall Cognitive Status: Impaired/Different from baseline Area of Impairment: Attention;Following commands;Memory;Safety/judgement;Awareness;Problem solving;Orientation                   Current Attention Level: Focused Memory: Decreased short-term  memory Following Commands: Follows one step commands inconsistently;Follows one step commands with increased time;Follows multi-step commands inconsistently;Follows multi-step commands with increased time Safety/Judgement: Decreased awareness of safety;Decreased awareness of deficits Awareness: Intellectual Problem Solving: Slow processing;Decreased initiation;Difficulty sequencing;Requires verbal cues;Requires tactile cues General Comments: pt much more alert and participatory today, continues to have slow processing however could be her [facility]         General Comments General comments (skin integrity, edema, etc.): Pt on 5L O2 via HFNC, SaO2 >90% during mobility      Pertinent Vitals/Pain Pain Assessment: Faces Faces Pain Scale: Hurts even more Pain Location: L knee, L shoulder Pain Descriptors / Indicators: Aching;Discomfort;Grimacing;Moaning;Sore Pain Intervention(s): Limited activity within patient's tolerance;Monitored during session;Repositioned           PT Goals (current goals can now be found in the care plan section) Acute Rehab PT Goals Patient Stated Goal: go back to sleep PT Goal Formulation: With patient Time For Goal Achievement: 06/03/19 Potential to Achieve Goals: Fair Progress towards PT goals: Progressing toward goals    Frequency    Min 2X/week      PT Plan Current plan remains appropriate    Co-evaluation PT/OT/SLP Co-Evaluation/Treatment: Yes            AM-PAC PT "6 Clicks" Mobility   Outcome Measure  Help needed turning from your back to your side while in a flat bed without using bedrails?: Total Help needed moving from lying on your back to sitting on the side of a flat bed without using bedrails?: Total Help needed moving to and from a bed to a chair (including a wheelchair)?: Total Help needed standing up from a chair using your arms (e.g., wheelchair or  bedside chair)?: Total Help needed to walk in hospital room?: Total Help needed  climbing 3-5 steps with a railing? : Total 6 Click Score: 6    End of Session Equipment Utilized During Treatment: Oxygen Activity Tolerance: Patient tolerated treatment well Patient left: with call bell/phone within reach;with chair alarm set;with family/visitor present;in chair Nurse Communication: Mobility status;Need for lift equipment PT Visit Diagnosis: Other abnormalities of gait and mobility (R26.89);Muscle weakness (generalized) (M62.81);Difficulty in walking, not elsewhere classified (R26.2);Unsteadiness on feet (R26.81)     Time: [id number] PT Time Calculation (min) (ACUTE ONLY): 37 min  Charges:  $Therapeutic Activity: 8-22 mins                     Dearl Rudden B. Migdalia Dk PT, DPT Acute Rehabilitation Services Pager [phone] Office [phone]    [place] 05/26/2019, 10:46 AM

## 2019-05-26 NOTE — Progress Notes (Signed)
PROGRESS NOTE    Jodi Underwood  [id number] DOB: [birth year] DOA: 05/13/2019 PCP: Jodi Marble, MD    Brief Narrative:  73 year old female with history significant for obesity, OSA, CKDadmitted with acute hypercarbic respiratory failure requiring intubation likely r/t CPAP noncompliance c/b decompensated diastolic heart failure. Improving slowly with aggressive diuresis, vent support, HR control. 12/28 Admit to PCCM Intubated 05/14/2019 >>>> extubated 05/19/2019  Assessment & Plan:   Active Problems:   Respiratory failure (HCC)   Acute respiratory failure with hypoxia and hypercarbia (HCC)   Chest pain   Renal insufficiency   Shortness of breath   Decubitus ulcer of coccygeal region, unspecified pressure ulcer stage  Acute hypoxic and hypercarbic respiratory failure requiring mechanical ventilation.  -currently remains on 4 L of oxygen with appropriate O2 sats -Suspect this is secondary to volume overload with obesity hypoventilation and noncompliant CPAP use , bicarbonate is high suggesting obesity hypoventilation syndrome -Continue supplemental O2 as needed  -Continue with pulmonary hygiene  -Mobilize -with aggressive PT OT qhs CPAP mandatory - needs ongoing discussions regarding importance of this -On CPAP overnight  AKI on Stage III CKD-suspect cardiorenal syndrome. (Baseline Scr ~3-3.5) -Renal ultrasound without hydronephrosis.  -Nephrology continues to follow -Pt clinically volume overloaded, had earlier tolerated lasix, however family claims pt is intolerant, thus now on bumex -Continuing to void well At this time patient remains a poor candidate for outpatient hemodialysis -Continue to follow serial BMET  Acute on chronic heart failure-HFpEFEF 60-65%/A. fib/  -LVEF 60 to 65%, elevated LVEDP, diastolic dysfunction grade II. continueamiodarone, metoprolol -Currently stable at this time  Hypertension -would recent hypovolemia/hypotension  -Remained stable, elevated blood pressure this a.m. -Presently on metoprolol -Titrating meds including metoprolol and Norvasc for better blood pressure control -Remains stable  Type II  diabetes, A1c 6.7- --diet-controlled per daughter.  continueLantus 13 units daily  -Continue with SSI coverage as needed -Glycemic trends remain stable  Hypothyroidism;  -TSH 9.2 at admission Continue home synthroid75 mcg daily -Currently stable  Acute encephalopathy -suspect metabolic/delirium.  Azotemia -Mentation continues to improve, alert and seems oriented this AM -Continue supportive care, physical therapy -Continue seroquelas tolerated  Generalized Deconditioning -- per daughter pt lives with family and is mentally very sharp, but is afraid of falling so rarely gets out of the chair, they have PT coming once per week -Therapy recs for SNF, anticipate dispo planning when more medially stable  Prolonged QTc due to meds-resolved -Continue Seroquel; be cautious starting other QTC prolonging meds -Cont to monitor at this time  Severe lower extremity edema exacerbated by lymphedema, -Cont with diuresis per Nephrology -Continue to follow BMET trends  DVT prophylaxis: Heparin subQ Code Status: Full Family Communication: Pt in room, family not at bedside this AM Disposition Plan: Uncertain at this time  Consultants:   PCCM, Nephrology, ENT  Procedures:     Antimicrobials: Anti-infectives (From admission, onward)   Start     Dose/Rate Route Frequency Ordered Stop   05/14/19 0900  azithromycin (ZITHROMAX) 500 mg in sodium chloride 0.9 % 250 mL IVPB     500 mg 250 mL/hr over 60 Minutes Intravenous Daily 05/14/19 0830 05/16/19 1104   05/14/19 0500  azithromycin (ZITHROMAX) 500 mg in sodium chloride 0.9 % 250 mL IVPB  Status:  Discontinued     500 mg 250 mL/hr over 60 Minutes Intravenous Daily 05/14/19 0331 05/14/19 0830   05/14/19 0330  cefTRIAXone (ROCEPHIN) 2 g in  sodium chloride 0.9 % 100 mL IVPB  Status:  Discontinued  2 g 200 mL/hr over 30 Minutes Intravenous Daily at bedtime 05/14/19 0310 05/16/19 0908      Subjective: Reports feeling better sitting in chair this AM  Objective: Vitals:   05/26/19 0420 05/26/19 0731 05/26/19 0825 05/26/19 0920  BP:  (!) 173/58 (!) 199/63 (!) 186/69  Pulse:  68 70 72  Resp:   19   Temp:  97.6 F (36.4 C) (!) 97.5 F (36.4 C)   TempSrc:  Oral Oral   SpO2: 100% 96% 98% 100%  Weight:      Height:        Intake/Output Summary (Last 24 hours) at 05/26/2019 1611 Last data filed at 05/26/2019 1500 Gross per 24 hour  Intake 870 ml  Output 2850 ml  Net -1980 ml   Filed Weights   05/22/19 0412 05/23/19 2142 05/24/19 0600  Weight: 126.6 kg (!) 177.6 kg (!) 206.8 kg    Examination: General exam: Conversant, in no acute distress, sitting in chair Respiratory system: normal chest rise, clear, no audible wheezing Cardiovascular system: regular rhythm, s1-s2 Gastrointestinal system: Nondistended, nontender, pos BS Central nervous system: No seizures, no tremors Extremities: No cyanosis, no joint deformities Skin: No rashes, no pallor Psychiatry: Affect normal // no auditory hallucinations   Data Reviewed: I have personally reviewed following labs and imaging studies  CBC: Recent Labs  Lab 05/22/19 0355 05/23/19 0356 05/24/19 0352 05/25/19 0448 05/26/19 0435  WBC 7.2 6.7 7.9 9.4 9.9  HGB 7.6* 7.7* 7.8* 8.1* 8.2*  HCT 27.5* 28.3* 28.5* 29.3* 30.1*  MCV 98.2 97.6 100.0 99.0 99.3  PLT 288 281 292 309 [id number]   Basic Metabolic Panel: Recent Labs  Lab 05/22/19 0355 05/23/19 0356 05/24/19 0352 05/25/19 0448 05/26/19 0435  NA 146* 145 149* 147* 145  K 4.9 4.4 4.3 4.0 3.9  CL 105 106 108 105 101  CO2 28 29 31 29  32  GLUCOSE 142* 124* 134* 143* 132*  BUN 135* 123* 116* 109* 97*  CREATININE 5.08* 4.91* 4.53* 4.25* 4.06*  CALCIUM 9.4 9.1 9.1 9.2 9.1  PHOS 5.5* 5.0* 4.9* 4.1 4.1   GFR:  Estimated Creatinine Clearance: 22.7 mL/min (A) (by C-G formula based on SCr of 4.06 mg/dL (H)). Liver Function Tests: Recent Labs  Lab 05/22/19 0355 05/23/19 0356 05/24/19 0352 05/25/19 0448 05/26/19 0435  ALBUMIN 2.7* 2.5* 2.6* 2.7* 2.7*   No results for input(s): LIPASE, AMYLASE in the last 168 hours. No results for input(s): AMMONIA in the last 168 hours. Coagulation Profile: Recent Labs  Lab 05/22/19 0355  INR 1.2   Cardiac Enzymes: No results for input(s): CKTOTAL, CKMB, CKMBINDEX, TROPONINI in the last 168 hours. BNP (last 3 results) No results for input(s): PROBNP in the last 8760 hours. HbA1C: No results for input(s): HGBA1C in the last 72 hours. CBG: Recent Labs  Lab 05/25/19 1915 05/25/19 2331 05/26/19 0309 05/26/19 0731 05/26/19 1209  GLUCAP 127* 114* 136* 121* 157*   Lipid Profile: No results for input(s): CHOL, HDL, LDLCALC, TRIG, CHOLHDL, LDLDIRECT in the last 72 hours. Thyroid Function Tests: No results for input(s): TSH, T4TOTAL, FREET4, T3FREE, THYROIDAB in the last 72 hours. Anemia Panel: No results for input(s): VITAMINB12, FOLATE, FERRITIN, TIBC, IRON, RETICCTPCT in the last 72 hours. Sepsis Labs: No results for input(s): PROCALCITON, LATICACIDVEN in the last 168 hours.  Recent Results (from the past 240 hour(s))  Culture, respiratory (non-expectorated)     Status: None   Collection Time: 05/17/19  1:56 PM   Specimen: Tracheal Aspirate; Respiratory  Result Value Ref Range Status   Specimen Description TRACHEAL ASPIRATE  Final   Special Requests NONE  Final   Gram Stain   Final    RARE WBC PRESENT,BOTH PMN AND MONONUCLEAR NO ORGANISMS SEEN    Culture   Final    RARE Consistent with normal respiratory flora. Performed at [facility], [place] [address] [place], [place] [address]    Report Status 05/20/2019 FINAL  Final     Radiology Studies: No results found.  Scheduled Meds: . amiodarone  200 mg Oral Daily  . amLODipine  10  mg Oral Daily  . bethanechol  5 mg Oral TID  . bumetanide  2 mg Oral BID  . carbamide peroxide  5 drop Both EARS BID  . Chlorhexidine Gluconate Cloth  6 each Topical Q0600  . heparin  5,000 Units Subcutaneous Q8H  . insulin aspart  0-15 Units Subcutaneous Q4H  . levothyroxine  75 mcg Per Tube Q0600  . mouth rinse  15 mL Mouth Rinse BID  . metoprolol tartrate  12.5 mg Oral BID  . Ensure Max Protein  11 oz Oral BID   Continuous Infusions: . sodium chloride Stopped (05/22/19 1552)     LOS: 12 days   Marylu Lund, MD [facility] Pager On Amion  If 7PM-7AM, please contact night-coverage 05/26/2019, 4:11 PM

## 2019-05-27 LAB — GLUCOSE, CAPILLARY
Glucose-Capillary: 135 mg/dL — ABNORMAL HIGH (ref 70–99)
Glucose-Capillary: 124 mg/dL — ABNORMAL HIGH (ref 70–99)
Glucose-Capillary: 127 mg/dL — ABNORMAL HIGH (ref 70–99)
Glucose-Capillary: 117 mg/dL — ABNORMAL HIGH (ref 70–99)
Glucose-Capillary: 128 mg/dL — ABNORMAL HIGH (ref 70–99)

## 2019-05-27 LAB — COMPREHENSIVE METABOLIC PANEL
ALT: 15 U/L (ref 0–44)
AST: 12 U/L — ABNORMAL LOW (ref 15–41)
Albumin: 2.6 g/dL — ABNORMAL LOW (ref 3.5–5.0)
Alkaline Phosphatase: 85 U/L (ref 38–126)
Anion gap: 10 (ref 5–15)
BUN: 88 mg/dL — ABNORMAL HIGH (ref 8–23)
CO2: 34 mmol/L — ABNORMAL HIGH (ref 22–32)
Calcium: 9.2 mg/dL (ref 8.9–10.3)
Chloride: 100 mmol/L (ref 98–111)
Creatinine, Ser: 3.73 mg/dL — ABNORMAL HIGH (ref 0.44–1.00)
GFR calc Af Amer: 13 mL/min — ABNORMAL LOW (ref >60)
GFR calc non Af Amer: 11 mL/min — ABNORMAL LOW (ref >60)
Glucose, Bld: 146 mg/dL — ABNORMAL HIGH (ref 70–99)
Potassium: 3.9 mmol/L (ref 3.5–5.1)
Sodium: 144 mmol/L (ref 135–145)
Total Bilirubin: 0.7 mg/dL (ref 0.3–1.2)
Total Protein: 6.8 g/dL (ref 6.5–8.1)

## 2019-05-27 LAB — TROPONIN I (HIGH SENSITIVITY)
Troponin I (High Sensitivity): 31 ng/L — ABNORMAL HIGH (ref <18)
Troponin I (High Sensitivity): 31 ng/L — ABNORMAL HIGH (ref <18)

## 2019-05-27 MED ORDER — MORPHINE SULFATE (PF) 2 MG/ML IV SOLN: 1.0000 mg | Freq: Once | INTRAVENOUS | Status: DC | PRN

## 2019-05-27 MED ORDER — DM-GUAIFENESIN ER 30-600 MG PO TB12
1.0000 | ORAL_TABLET | Freq: Two times a day (BID) | ORAL | Status: DC | PRN
  Administered 2019-05-27 – 2019-05-31 (×7): 1 via ORAL
  Filled 2019-05-27 (×7): qty 1

## 2019-05-27 MED ORDER — ROPINIROLE HCL 1 MG PO TABS
0.5000 mg | ORAL_TABLET | Freq: Every day | ORAL | Status: DC
  Administered 2019-05-27 – 2019-05-30 (×4): 0.5 mg via ORAL
  Filled 2019-05-27 (×4): qty 1

## 2019-05-27 MED ORDER — FLUTICASONE PROPIONATE 50 MCG/ACT NA SUSP
1.0000 | Freq: Every day | NASAL | Status: DC | PRN
  Administered 2019-05-27 – 2019-05-30 (×3): 1 via NASAL
  Filled 2019-05-27 (×2): qty 16

## 2019-05-27 MED ORDER — HYDROCORTISONE 1 % EX CREA
TOPICAL_CREAM | Freq: Four times a day (QID) | CUTANEOUS | Status: DC | PRN
  Filled 2019-05-27: qty 28

## 2019-05-27 NOTE — Progress Notes (Signed)
Jodi Underwood PROGRESS NOTE  Assessment/ Plan: Pt is a 73 y.o. yo female with history of morbid obesity, OSA, CKD stage IV admitted with respiratory failure, consulted for AKI on CKD.  #AKI on CKD stage IV: Baseline creatinine level around 3-3.5.  AKI likely hemodynamically mediated.  UA likely contaminant.  Kidney US with no obstruction, right kidney small.  Patient with steady improvement in renal function with successful diuresis.  At this point would continue Bumex at current dosing.  No further suggestions, will need outpatient follow-up in our office, I will make arrangements.  #Ventilator dependent respiratory failure, now extubated nasal cannula.  Chest x-ray with pulmonary edema.  As above, continue diuretics  #CHF, EF 60 to 65%: Diuretics as above.  #Anemia: Received blood transfusion.  Iron saturation 15%.  Treated with IV iron and ESA.  Hemoglobin is stable.  #Acute encephalopathy, metabolic: Minimize opiates or sedatives.  Patient alert awake and following commands.  #Hyperphosphatemia: PTH level 39.  Monitor phosphorus level.  #General deconditioning.  PT/OT recommending SNF   Subjective:   Great UOP on PO Bumex  Futher improvement in SCr, K 3.9   Objective Vital signs in last 24 hours: Vitals:   05/26/19 1650 05/26/19 2358 05/27/19 0350 05/27/19 0724  BP: (!) 147/53 (!) 134/55 (!) 143/68 (!) 173/58  Pulse: 68 60  65  Resp:  20  16  Temp:  97.6 F (36.4 C) (!) 97.3 F (36.3 C) 97.6 F (36.4 C)  TempSrc:  Oral Axillary Oral  SpO2: 100% 100% 98% 97%  Weight:      Height:       Weight change:   Intake/Output Summary (Last 24 hours) at 05/27/2019 1407 Last data filed at 05/27/2019 0500 Gross per 24 hour  Intake 480 ml  Output 1850 ml  Net -1370 ml       Labs: Basic Metabolic Panel: Recent Labs  Lab 05/24/19 0352 05/25/19 0448 05/26/19 0435 05/27/19 0406  NA 149* 147* 145 144  K 4.3 4.0 3.9 3.9  CL 108 105 101 100   CO2 31 29 32 34*  GLUCOSE 134* 143* 132* 146*  BUN 116* 109* 97* 88*  CREATININE 4.53* 4.25* 4.06* 3.73*  CALCIUM 9.1 9.2 9.1 9.2  PHOS 4.9* 4.1 4.1  --    Liver Function Tests: Recent Labs  Lab 05/25/19 0448 05/26/19 0435 05/27/19 0406  AST  --   --  12*  ALT  --   --  15  ALKPHOS  --   --  85  BILITOT  --   --  0.7  PROT  --   --  6.8  ALBUMIN 2.7* 2.7* 2.6*   No results for input(s): LIPASE, AMYLASE in the last 168 hours. No results for input(s): AMMONIA in the last 168 hours. CBC: Recent Labs  Lab 05/22/19 0355 05/23/19 0356 05/24/19 0352 05/25/19 0448 05/26/19 0435  WBC 7.2 6.7 7.9 9.4 9.9  HGB 7.6* 7.7* 7.8* 8.1* 8.2*  HCT 27.5* 28.3* 28.5* 29.3* 30.1*  MCV 98.2 97.6 100.0 99.0 99.3  PLT 288 281 292 309 301   Cardiac Enzymes: No results for input(s): CKTOTAL, CKMB, CKMBINDEX, TROPONINI in the last 168 hours. CBG: Recent Labs  Lab 05/26/19 1647 05/26/19 1941 05/27/19 0346 05/27/19 0721 05/27/19 1127  GLUCAP 155* 124* 135* 124* 127*    Iron Studies:  No results for input(s): IRON, TIBC, TRANSFERRIN, FERRITIN in the last 72 hours. Studies/Results: No results found.  Medications: Infusions: . sodium chloride Stopped (  05/22/19 1552)    Scheduled Medications: . amiodarone  200 mg Oral Daily  . amLODipine  10 mg Oral Daily  . bethanechol  5 mg Oral TID  . bumetanide  2 mg Oral BID  . carbamide peroxide  5 drop Both EARS BID  . Chlorhexidine Gluconate Cloth  6 each Topical Q0600  . heparin  5,000 Units Subcutaneous Q8H  . insulin aspart  0-15 Units Subcutaneous Q4H  . levothyroxine  75 mcg Per Tube Q0600  . mouth rinse  15 mL Mouth Rinse BID  . metoprolol tartrate  12.5 mg Oral BID  . nystatin   Topical TID  . Ensure Max Protein  11 oz Oral BID  . rOPINIRole  0.5 mg Oral QHS    have reviewed scheduled and prn medications.  Physical Exam: General: Not in distress, lying on bed Heart:RRR, s1s2 nl, no rubs Lungs: Bilateral crackles, no  increased work of breathing Abdomen:soft, Non-tender, distended with marked obesity no abdominal wall edema Extremities: Bilateral lymphedema and the edema is somewhat better today Neurology: Alert awake and following simple commands  Jodi Underwood Jodi Underwood 05/27/2019,2:07 PM  LOS: 13 days

## 2019-05-27 NOTE — Progress Notes (Signed)
MD notified of pt c/o chest heaviness/sharp pain rated an 8 out of 10  that does not radiate. This RN obtained an EKG. MD reports no significant changes from last EKG. MD ordered 1x dose of Morphine for pain. Offered med to patient and pt refused. She accepted Tylenol and Mucinex for cough/congestion complaint. Will continue to monitor.

## 2019-05-27 NOTE — Progress Notes (Addendum)
PROGRESS NOTE    Jodi Underwood  [id number] DOB: [birth year] DOA: 05/13/2019 PCP: Jodi Marble, MD     Brief Narrative:  Jodi Underwood is a 73 year old female with past medical history significant for obesity, OSA, CKD stage IV who was admitted secondary to acute hypercarbic respiratory failure requiring intubation.  This was thought to be due to CPAP noncompliance as well as decompensated diastolic heart failure.  Patient has been diuresed, extubated on 11/2.  New events last 24 hours / Subjective: No acute events overnight.  Complains of restless legs overnight as well as some nasal congestion  Assessment & Plan:   Active Problems:   Respiratory failure (HCC)   Acute respiratory failure with hypoxia and hypercarbia (HCC)   Chest pain   Renal insufficiency   Shortness of breath   Decubitus ulcer of coccygeal region, unspecified pressure ulcer stage   Acute hypoxic and hypercarbic respiratory failure requiring mechanical ventilation -Has now been extubated -Suspect this is secondary to volume overload with obesity hypoventilation and noncompliant CPAP use  -Continue CPAP nightly  AKI on CKD stage IV, suspect cardiorenal syndrome -Baseline Cr 3-3.5 -Renal ultrasound without hydronephrosis -Nephrology following -However family claims pt is intolerant of lasix, thus now on bumex  Acute on chronic diastolic heart failure -LVEF 60 to 65%, elevated LVEDP, diastolic dysfunction grade II -Bumex as above   A Fib -Continueamiodarone, metoprolol  Hypertension  -Continue metoprolol and Norvasc   Type II diabetes, well controlled -HA1c 6.7 -SSI   Hypothyroidism -Continue synthroid  Acute metabolic encephalopathy -Stable  Generalized Deconditioning -PT OT rec SNF   Severe lower extremity edema exacerbated by lymphedema -Continue with diuresis per Nephrology  RLS -Start ropinirole  Left ear hearing loss -Evaluated by ENT Dr. Benjamine Mola  during hospitalization -Follow-up in his office for cerumen removal and formal hearing test   In agreement with assessment of the pressure ulcer as below:  Pressure Injury 05/14/19 Buttocks Right;Distal;Bilateral;Upper Deep Tissue Injury - Purple or maroon localized area of discolored intact skin or blood-filled blister due to damage of underlying soft tissue from pressure and/or shear. Dark non-blanchable  (Active)  05/14/19 0300  Location: Buttocks  Location Orientation: Right;Distal;Bilateral;Upper  Staging: Deep Tissue Injury - Purple or maroon localized area of discolored intact skin or blood-filled blister due to damage of underlying soft tissue from pressure and/or shear.  Wound Description (Comments): Dark non-blanchable skin with multiple open areas  Present on Admission: Yes     Nutrition Problem: Inadequate oral intake Etiology: inability to eat   DVT prophylaxis: Subcutaneous heparin Code Status: Full code Family Communication: None at bedside Disposition Plan: Family refusing SNF, plan to take patient home with home health   Consultants:   PCCM  ENT  Nephrology   Antimicrobials:  Anti-infectives (From admission, onward)   Start     Dose/Rate Route Frequency Ordered Stop   05/14/19 0900  azithromycin (ZITHROMAX) 500 mg in sodium chloride 0.9 % 250 mL IVPB     500 mg 250 mL/hr over 60 Minutes Intravenous Daily 05/14/19 0830 05/16/19 1104   05/14/19 0500  azithromycin (ZITHROMAX) 500 mg in sodium chloride 0.9 % 250 mL IVPB  Status:  Discontinued     500 mg 250 mL/hr over 60 Minutes Intravenous Daily 05/14/19 0331 05/14/19 0830   05/14/19 0330  cefTRIAXone (ROCEPHIN) 2 g in sodium chloride 0.9 % 100 mL IVPB  Status:  Discontinued     2 g 200 mL/hr over 30 Minutes Intravenous Daily at bedtime 05/14/19 0310 05/16/19  [id number]        Objective: Vitals:   05/26/19 1650 05/26/19 2358 05/27/19 0350 05/27/19 0724  BP: (!) 147/53 (!) 134/55 (!) 143/68 (!) 173/58   Pulse: 68 60  65  Resp:  20  16  Temp:  97.6 F (36.4 C) (!) 97.3 F (36.3 C) 97.6 F (36.4 C)  TempSrc:  Oral Axillary Oral  SpO2: 100% 100% 98% 97%  Weight:      Height:        Intake/Output Summary (Last 24 hours) at 05/27/2019 1231 Last data filed at 05/27/2019 0500 Gross per 24 hour  Intake 630 ml  Output 2650 ml  Net -2020 ml   Filed Weights   05/22/19 0412 05/23/19 2142 05/24/19 0600  Weight: 126.6 kg (!) 177.6 kg (!) 206.8 kg    Examination:  General exam: Appears calm and comfortable  Respiratory system: Exam limited due to body habitus.  Clear to auscultation anteriorly.  No conversational dyspnea Cardiovascular system: S1 & S2 heard, RRR. No murmurs. + Bilateral lymphedema Gastrointestinal system: Abdomen is nondistended, soft and nontender. Normal bowel sounds heard. Central nervous system: Alert and oriented. No focal neurological deficits. Speech clear.  Psychiatry: Judgement and insight appear normal. Mood & affect appropriate.   Data Reviewed: I have personally reviewed following labs and imaging studies  CBC: Recent Labs  Lab 05/22/19 0355 05/23/19 0356 05/24/19 0352 05/25/19 0448 05/26/19 0435  WBC 7.2 6.7 7.9 9.4 9.9  HGB 7.6* 7.7* 7.8* 8.1* 8.2*  HCT 27.5* 28.3* 28.5* 29.3* 30.1*  MCV 98.2 97.6 100.0 99.0 99.3  PLT 288 281 292 309 [id number]   Basic Metabolic Panel: Recent Labs  Lab 05/22/19 0355 05/23/19 0356 05/24/19 0352 05/25/19 0448 05/26/19 0435 05/27/19 0406  NA 146* 145 149* 147* 145 144  K 4.9 4.4 4.3 4.0 3.9 3.9  CL 105 106 108 105 101 100  CO2 28 29 31 29  32 34*  GLUCOSE 142* 124* 134* 143* 132* 146*  BUN 135* 123* 116* 109* 97* 88*  CREATININE 5.08* 4.91* 4.53* 4.25* 4.06* 3.73*  CALCIUM 9.4 9.1 9.1 9.2 9.1 9.2  PHOS 5.5* 5.0* 4.9* 4.1 4.1  --    GFR: Estimated Creatinine Clearance: 24.7 mL/min (A) (by C-G formula based on SCr of 3.73 mg/dL (H)). Liver Function Tests: Recent Labs  Lab 05/23/19 0356 05/24/19 0352  05/25/19 0448 05/26/19 0435 05/27/19 0406  AST  --   --   --   --  12*  ALT  --   --   --   --  15  ALKPHOS  --   --   --   --  85  BILITOT  --   --   --   --  0.7  PROT  --   --   --   --  6.8  ALBUMIN 2.5* 2.6* 2.7* 2.7* 2.6*   No results for input(s): LIPASE, AMYLASE in the last 168 hours. No results for input(s): AMMONIA in the last 168 hours. Coagulation Profile: Recent Labs  Lab 05/22/19 0355  INR 1.2   Cardiac Enzymes: No results for input(s): CKTOTAL, CKMB, CKMBINDEX, TROPONINI in the last 168 hours. BNP (last 3 results) No results for input(s): PROBNP in the last 8760 hours. HbA1C: No results for input(s): HGBA1C in the last 72 hours. CBG: Recent Labs  Lab 05/26/19 1647 05/26/19 1941 05/27/19 0346 05/27/19 0721 05/27/19 1127  GLUCAP 155* 124* 135* 124* 127*   Lipid Profile: No results for input(s): CHOL, HDL,  LDLCALC, TRIG, CHOLHDL, LDLDIRECT in the last 72 hours. Thyroid Function Tests: No results for input(s): TSH, T4TOTAL, FREET4, T3FREE, THYROIDAB in the last 72 hours. Anemia Panel: No results for input(s): VITAMINB12, FOLATE, FERRITIN, TIBC, IRON, RETICCTPCT in the last 72 hours. Sepsis Labs: No results for input(s): PROCALCITON, LATICACIDVEN in the last 168 hours.  Recent Results (from the past 240 hour(s))  Culture, respiratory (non-expectorated)     Status: None   Collection Time: 05/17/19  1:56 PM   Specimen: Tracheal Aspirate; Respiratory  Result Value Ref Range Status   Specimen Description TRACHEAL ASPIRATE  Final   Special Requests NONE  Final   Gram Stain   Final    RARE WBC PRESENT,BOTH PMN AND MONONUCLEAR NO ORGANISMS SEEN    Culture   Final    RARE Consistent with normal respiratory flora. Performed at [facility], [address], [place], [place] [address]    Report Status 05/20/2019 FINAL  Final      Radiology Studies: No results found.    Scheduled Meds: . amiodarone  200 mg Oral Daily  . amLODipine  10 mg Oral  Daily  . bethanechol  5 mg Oral TID  . bumetanide  2 mg Oral BID  . carbamide peroxide  5 drop Both EARS BID  . Chlorhexidine Gluconate Cloth  6 each Topical Q0600  . heparin  5,000 Units Subcutaneous Q8H  . insulin aspart  0-15 Units Subcutaneous Q4H  . levothyroxine  75 mcg Per Tube Q0600  . mouth rinse  15 mL Mouth Rinse BID  . metoprolol tartrate  12.5 mg Oral BID  . nystatin   Topical TID  . Ensure Max Protein  11 oz Oral BID  . rOPINIRole  0.5 mg Oral QHS   Continuous Infusions: . sodium chloride Stopped (05/22/19 1552)     LOS: 13 days      Time spent: 35 minutes   Dessa Phi, DO [facility] 05/27/2019, 12:31 PM   Available via Epic secure chat 7am-7pm After these hours, please refer to coverage provider listed on [email or web address]

## 2019-05-27 NOTE — TOC Progression Note (Signed)
Transition of Care [facility]) - Progression Note    Patient Details  Name: Bevin Andino MRN: [id number] Date of Birth: [birth year]  Transition [facility] CM/SW Contact  Maryclare Labrador, RN Phone Number: 05/27/2019, 10:54 AM  Clinical Narrative:   CM discussed discharge home with daughter Gilmore Laroche.  CM reiterated with daughter that safe discharge recommendation continues to be SNF, CM also reviewed latest PT note and bedside nurse assessment that pt is basically a total assist.  Daughter acknowledged information, family still wishes to take pt home at discharge.  Daughters aware that [facility] will not provide 24 hour care and they have already worked out a plan to provide it. CM requested daughter to review agencies on medicare.gov HH compare website and discuss with sisters - CM will follow back up regarding choice.   Pt already has a hoyer lift and hospital bed in the home.  Per daughter pts doctor makes house calls.  Daughter denied barriers with paying for medications.  Pt will need PTAR transportation home.  Current discharge barriers for pt include;  Not yet at baseline kidney function.      Expected Discharge Plan: [place] Barriers to Discharge: Continued Medical Work up(Renal moving towards baseline Creatinine 3-3,5, today at 3.7 - no HD but intervening with diuretics)  Expected Discharge Plan and Services Expected Discharge Plan: [place] arrangements for the past 2 months: Single Family Home                                       Social Determinants of Health (SDOH) Interventions    Readmission Risk Interventions No flowsheet data found.

## 2019-05-28 DIAGNOSIS — R1013 Epigastric pain: Secondary | ICD-10-CM

## 2019-05-28 LAB — GLUCOSE, CAPILLARY
Glucose-Capillary: 104 mg/dL — ABNORMAL HIGH (ref 70–99)
Glucose-Capillary: 107 mg/dL — ABNORMAL HIGH (ref 70–99)
Glucose-Capillary: 117 mg/dL — ABNORMAL HIGH (ref 70–99)
Glucose-Capillary: 118 mg/dL — ABNORMAL HIGH (ref 70–99)
Glucose-Capillary: 121 mg/dL — ABNORMAL HIGH (ref 70–99)
Glucose-Capillary: 121 mg/dL — ABNORMAL HIGH (ref 70–99)
Glucose-Capillary: 97 mg/dL (ref 70–99)

## 2019-05-28 LAB — HEPATIC FUNCTION PANEL
ALT: 13 U/L (ref 0–44)
AST: 14 U/L — ABNORMAL LOW (ref 15–41)
Albumin: 2.7 g/dL — ABNORMAL LOW (ref 3.5–5.0)
Alkaline Phosphatase: 68 U/L (ref 38–126)
Bilirubin, Direct: <0.1 mg/dL (ref 0.0–0.2)
Total Bilirubin: 0.8 mg/dL (ref 0.3–1.2)
Total Protein: 6.4 g/dL — ABNORMAL LOW (ref 6.5–8.1)

## 2019-05-28 LAB — BASIC METABOLIC PANEL
Anion gap: 12 (ref 5–15)
BUN: 80 mg/dL — ABNORMAL HIGH (ref 8–23)
CO2: 34 mmol/L — ABNORMAL HIGH (ref 22–32)
Calcium: 9 mg/dL (ref 8.9–10.3)
Chloride: 98 mmol/L (ref 98–111)
Creatinine, Ser: 3.57 mg/dL — ABNORMAL HIGH (ref 0.44–1.00)
GFR calc Af Amer: 14 mL/min — ABNORMAL LOW (ref >60)
GFR calc non Af Amer: 12 mL/min — ABNORMAL LOW (ref >60)
Glucose, Bld: 130 mg/dL — ABNORMAL HIGH (ref 70–99)
Potassium: 3.8 mmol/L (ref 3.5–5.1)
Sodium: 144 mmol/L (ref 135–145)

## 2019-05-28 MED ORDER — ONDANSETRON HCL 4 MG/2ML IJ SOLN
4.0000 mg | Freq: Four times a day (QID) | INTRAMUSCULAR | Status: DC | PRN
  Administered 2019-05-28: 4 mg via INTRAVENOUS
  Filled 2019-05-28: qty 2

## 2019-05-28 NOTE — Progress Notes (Signed)
Occupational Therapy Treatment Patient Details Name: Jodi Underwood MRN: [id number] DOB: [birth year] Today's Date: 05/28/2019    History of present illness 73 year old female with past medical history of morbid obesity, OSA, hypertension, type 2 diabetes, atrial fibrillation,  Asthma, CKD stage IV, heart failure, hypothyroidism who presents with 1 week of shortness of breath, cough and chest pressure.  Patient intubated in the ED and admitted to ICU10/28/20. acute hypercarbic respiratory failure likely r/t CPAP noncompliance c/b decompensated diastolic heart failure Extubated 05/19/19.    OT comments  Pt making progress towards goals. Pt continues to present with decreased strength, activity tolerance, safety, poor awareness, and increased pain. Upon arrival, pt reports she had a bowel movement in bed, but requested to be cleaned up later because she was comfortable, and did not want to move and become uncomfortable. Provided education regarding importance of notifying RN before or immediately after a BM to prevent skin sores. Pt agreed to cleaning up BM in bed, required mod A +2 initially for rolling, but progressed to requiring max A +3/ +4 with fatigue; required additional person and total A for peri-care. Continue to recommend dc to SNF to increase safe performance of ADLs. Will follow acutely as admitted.    Follow Up Recommendations  SNF;Supervision/Assistance - 24 hour    Equipment Recommendations  Other (comment)(defer to next venue)    Recommendations for Other Services PT consult    Precautions / Restrictions Precautions Precautions: Fall Precaution Comments: family reports multiple falls, causing fear of movement Restrictions Weight Bearing Restrictions: No       Mobility Bed Mobility Overal bed mobility: Needs Assistance Bed Mobility: Rolling Rolling: Mod assist;Max assist;+2 for physical assistance;+2 for safety/equipment         General bed mobility comments: Pt  able to reach for bed rail and pull trunk to initiate rolling with mod A +2 initially, progressed to requiring max A +3/+4 for rolling due to fatigue.   Transfers                 General transfer comment: deferred    Balance                                           ADL either performed or assessed with clinical judgement   ADL Overall ADL's : Needs assistance/impaired                             Toileting- Clothing Manipulation and Hygiene: Maximal assistance;+2 for physical assistance;Bed level;Total assistance;+2 for safety/equipment;Moderate assistance Toileting - Clothing Manipulation Details (indicate cue type and reason): Pt required mod A +2 initially to roll, as patient fatigued required max A +2/+3, with total A and an additional person for hygiene management. Pt reported that she had been sitting in BM for a while, provided education regarding importance of notifying RN if she has a BM, or before a BM if possible.      Functional mobility during ADLs: Maximal assistance;+2 for physical assistance;+2 for safety/equipment;Moderate assistance(rolling in bed) General ADL Comments: Pt required mod A +2 inititally to roll in bed, but progressed to max A +2/+3 as pt fatigued. Focused session on cleaning up large BM in bed with max A -total A +3/+4     Vision       Perception     Praxis  Cognition Arousal/Alertness: Awake/alert Behavior During Therapy: WFL for tasks assessed/performed Overall Cognitive Status: Impaired/Different from baseline Area of Impairment: Attention;Following commands;Safety/judgement;Awareness;Problem solving                   Current Attention Level: Sustained   Following Commands: Follows one step commands with increased time;Follows one step commands consistently Safety/Judgement: Decreased awareness of safety;Decreased awareness of deficits Awareness: Intellectual Problem Solving: Slow  processing;Decreased initiation;Difficulty sequencing;Requires verbal cues;Requires tactile cues General Comments: Upon arrival, pt reports that she had a BM in bed. Pt had decreased awareness of safety as seen by wanting to wait until later to be cleaned up because she just got comfortable and didnt want to be uncomfortable again. Provided education on importance of notifying nurse prior to having a BM if possible, and to let RN know as soon as possible if a BM happens. Provided education that if she sits in her own stool, it can cause wounds. Pt verbalized understanding, and agreed to be cleaned up. Pt HOH, requires simple commands, follows consistently and with increased time.         Exercises     Shoulder Instructions       General Comments Pt in 6l O2 via HFNC, VSS throughout. Pt with significant edema of BLE, but edema is decreasing in comparison to earlier sessions.  Foam dressings reapplied over wounds on lower back and buttock.      Pertinent Vitals/ Pain       Pain Assessment: Faces Faces Pain Scale: Hurts even more Pain Location: L knee, L shoulder Pain Descriptors / Indicators: Aching;Discomfort;Grimacing;Moaning;Sore Pain Intervention(s): Limited activity within patient's tolerance;Monitored during session;Repositioned  Home Living                                          Prior Functioning/Environment              Frequency  Min 2X/week        Progress Toward Goals  OT Goals(current goals can now be found in the care plan section)  Progress towards OT goals: Progressing toward goals  Acute Rehab OT Goals Patient Stated Goal: be comfortable OT Goal Formulation: With patient Time For Goal Achievement: 06/04/19 Potential to Achieve Goals: Good ADL Goals Pt Will Perform Grooming: with mod assist;sitting Pt Will Perform Upper Body Dressing: with mod assist;sitting Pt Will Perform Lower Body Dressing: with max assist;sit to/from stand Pt  Will Transfer to Toilet: with max assist;squat pivot transfer;with +2 assist;bedside commode Pt Will Perform Toileting - Clothing Manipulation and hygiene: with max assist;sitting/lateral leans  Plan Discharge plan remains appropriate    Co-evaluation    PT/OT/SLP Co-Evaluation/Treatment: Yes Reason for Co-Treatment: For patient/therapist safety;To address functional/ADL transfers PT goals addressed during session: Mobility/safety with mobility OT goals addressed during session: ADL's and self-care      AM-PAC OT "6 Clicks" Daily Activity     Outcome Measure   Help from another person eating meals?: A Lot Help from another person taking care of personal grooming?: A Lot Help from another person toileting, which includes using toliet, bedpan, or urinal?: Total Help from another person bathing (including washing, rinsing, drying)?: Total Help from another person to put on and taking off regular upper body clothing?: Total Help from another person to put on and taking off regular lower body clothing?: Total 6 Click Score: 8    End of  Session Equipment Utilized During Treatment: Oxygen  OT Visit Diagnosis: Pain;Cognitive communication deficit (R41.841);Muscle weakness (generalized) (M62.81);Repeated falls (R29.6);History of falling (Z91.81);Other symptoms and signs involving cognitive function Pain - part of body: Shoulder;Knee   Activity Tolerance Patient limited by fatigue   Patient Left in chair;with call bell/phone within reach;with family/visitor present   Nurse Communication Mobility status        Time: [id number] OT Time Calculation (min): 60 min  Charges: OT General Charges $OT Visit: 1 Visit OT Treatments $Self Care/Home Management : 23-37 mins  Gus Rankin, OT Student  Gus Rankin 05/28/2019, 4:09 PM

## 2019-05-28 NOTE — Progress Notes (Signed)
Pt says her chest heaviness/pain is SOB - breathing treatment given, CPAP put on. Pt resting comfortably. Pt was repositioned.

## 2019-05-28 NOTE — Progress Notes (Signed)
PROGRESS NOTE    Jodi Underwood  [id number] DOB: [birth year] DOA: 05/13/2019 PCP: Jodi Marble, MD   Brief Narrative: Jodi Underwood is a 73 y.o. female with past medical history significant for obesity, OSA, CKD stage IV. Patient admitted secondary to respiratory failure requiring intubation and mechanical ventilation secondary to CPAP non-adherence in addition to acute heart failure.   Assessment & Plan:   Active Problems:   Respiratory failure (HCC)   Acute respiratory failure with hypoxia and hypercarbia (HCC)   Chest pain   Renal insufficiency   Shortness of breath   Decubitus ulcer of coccygeal region, unspecified pressure ulcer stage   Acute respiratory failure with hypoxia and hypercapnia Patient required intubation with mechanical ventilation from 10/28 until 11/2. Now extubated and on 4 L. Concern etiology is OHS and CPAP non-adherence -Continue CPAP  AKI on CKD stage IV Concern for cardiorenal disease. Nephrology consulted. Renal ultrasound with evidence of hydronephrosis. Started on Bumex. AKI improved. Outpatient follow-up per nephrology recommendations.  Acute on chronic diastolic heart failure Contributed to above. Managed on Bumex. Improved.  Atrial fibrillation, unspecified -Continue amiodarone, metoprolol  Essential hypertension -Continue metoprolol and amlodipine  Diabetes mellitus, type 2 -Continue SSI  Hypothyroidism -Continue Synthroid  Acute metabolic encephalopathy Resolved.  Lymphedema Chronic  Abdominal pain Unclear etiology. -Hepatic function panel; if abnormal, will get RUQ ultrasound  Restless leg syndrome Started on Requip  Left ear hearing loss Evaluated by ENT with recommendations for outpatient cerumen removal and formal hearing testing.  Pressure injury Multiple areas of buttock, present on admission    DVT prophylaxis: heparin Code Status:   Code Status: Full Code Family Communication: None at  bedside Disposition Plan: Discharge home when stable   Consultants:   PCCM  Nephrology  Procedures:   ETT  Transthoracic Echocardiogram  IMPRESSIONS    1. Left ventricular ejection fraction, by visual estimation, is 60 to 65%. The left ventricle has normal function. There is no left ventricular hypertrophy.  2. Definity contrast agent was given IV to delineate the left ventricular endocardial borders.  3. Elevated left ventricular end-diastolic pressure.  4. Left ventricular diastolic parameters are consistent with Grade II diastolic dysfunction (pseudonormalization).  5. Global right ventricle has normal systolic function.The right ventricular size is normal. No increase in right ventricular wall thickness.  6. Left atrial size was normal.  7. Right atrial size was normal.  8. The mitral valve is normal in structure. No evidence of mitral valve regurgitation. No evidence of mitral stenosis.  9. The tricuspid valve is normal in structure. Tricuspid valve regurgitation is not demonstrated. 10. The aortic valve is normal in structure. Aortic valve regurgitation is not visualized. No evidence of aortic valve sclerosis or stenosis. 11. The pulmonic valve was normal in structure. Pulmonic valve regurgitation is not visualized. 12. The inferior vena cava is dilated in size with >50% respiratory variability, suggesting right atrial pressure of 8 mmHg.  FINDINGS  Left Ventricle: Left ventricular ejection fraction, by visual estimation, is 60 to 65%. The left ventricle has normal function. Definity contrast agent was given IV to delineate the left ventricular endocardial borders. There is no left ventricular  hypertrophy. Left ventricular diastolic parameters are consistent with Grade II diastolic dysfunction (pseudonormalization). Elevated left ventricular end-diastolic pressure.  Right Ventricle: The right ventricular size is normal. No increase in right ventricular wall thickness.  Global RV systolic function is has normal systolic function.  Left Atrium: Left atrial size was normal in size.  Right Atrium: Right atrial  size was normal in size  Pericardium: There is no evidence of pericardial effusion.  Mitral Valve: The mitral valve is normal in structure. No evidence of mitral valve stenosis by observation. No evidence of mitral valve regurgitation.  Tricuspid Valve: The tricuspid valve is normal in structure. Tricuspid valve regurgitation is not demonstrated.  Aortic Valve: The aortic valve is normal in structure. Aortic valve regurgitation is not visualized. The aortic valve is structurally normal, with no evidence of sclerosis or stenosis.  Pulmonic Valve: The pulmonic valve was normal in structure. Pulmonic valve regurgitation is not visualized.  Aorta: The aortic root, ascending aorta and aortic arch are all structurally normal, with no evidence of dilitation or obstruction.  Venous: The inferior vena cava is dilated in size with greater than 50% respiratory variability, suggesting right atrial pressure of 8 mmHg.  IAS/Shunts: No atrial level shunt detected by color flow Doppler. No ventricular septal defect is seen or detected. There is no evidence of an atrial septal defect.  Antimicrobials:  Ceftriaxone  Azithromycin    Subjective: Some epigastric pain. Episode of emesis this morning. Belching.   Objective: Vitals:   05/28/19 0212 05/28/19 0822 05/28/19 1657 05/28/19 1759  BP:  (!) 160/56 (!) 157/68   Pulse:  74 63 63  Resp:  18 20 18   Temp:  (!) 97.5 F (36.4 C) (!) 97.5 F (36.4 C)   TempSrc:  Axillary Axillary   SpO2: 98% 100% 98% 98%  Weight:      Height:        Intake/Output Summary (Last 24 hours) at 05/28/2019 1843 Last data filed at 05/28/2019 1826 Gross per 24 hour  Intake 480 ml  Output 2926 ml  Net -2446 ml   Filed Weights   05/22/19 0412 05/23/19 2142 05/24/19 0600  Weight: 126.6 kg (!) 177.6 kg (!) 206.8 kg     Examination:  General exam: Appears calm and comfortable Respiratory system: Clear to auscultation. Respiratory effort normal. Cardiovascular system: S1 & S2 heard, RRR. No murmurs, rubs, gallops or clicks. Gastrointestinal system: Abdomen is nondistended, soft and nontender. No organomegaly or masses felt. Normal bowel sounds heard. Central nervous system: Alert and oriented. No focal neurological deficits. Extremities: No edema. No calf tenderness Skin: No cyanosis. No rashes Psychiatry: Judgement and insight appear normal. Mood & affect appropriate.     Data Reviewed: I have personally reviewed following labs and imaging studies  CBC: Recent Labs  Lab 05/22/19 0355 05/23/19 0356 05/24/19 0352 05/25/19 0448 05/26/19 0435  WBC 7.2 6.7 7.9 9.4 9.9  HGB 7.6* 7.7* 7.8* 8.1* 8.2*  HCT 27.5* 28.3* 28.5* 29.3* 30.1*  MCV 98.2 97.6 100.0 99.0 99.3  PLT 288 281 292 309 [id number]   Basic Metabolic Panel: Recent Labs  Lab 05/22/19 0355 05/23/19 0356 05/24/19 0352 05/25/19 0448 05/26/19 0435 05/27/19 0406 05/28/19 0350  NA 146* 145 149* 147* 145 144 144  K 4.9 4.4 4.3 4.0 3.9 3.9 3.8  CL 105 106 108 105 101 100 98  CO2 28 29 31 29  32 34* 34*  GLUCOSE 142* 124* 134* 143* 132* 146* 130*  BUN 135* 123* 116* 109* 97* 88* 80*  CREATININE 5.08* 4.91* 4.53* 4.25* 4.06* 3.73* 3.57*  CALCIUM 9.4 9.1 9.1 9.2 9.1 9.2 9.0  PHOS 5.5* 5.0* 4.9* 4.1 4.1  --   --    GFR: Estimated Creatinine Clearance: 25.8 mL/min (A) (by C-G formula based on SCr of 3.57 mg/dL (H)). Liver Function Tests: Recent Labs  Lab 05/23/19 [phone]  05/24/19 0352 05/25/19 0448 05/26/19 0435 05/27/19 0406  AST  --   --   --   --  12*  ALT  --   --   --   --  15  ALKPHOS  --   --   --   --  85  BILITOT  --   --   --   --  0.7  PROT  --   --   --   --  6.8  ALBUMIN 2.5* 2.6* 2.7* 2.7* 2.6*   No results for input(s): LIPASE, AMYLASE in the last 168 hours. No results for input(s): AMMONIA in the last 168 hours.  Coagulation Profile: Recent Labs  Lab 05/22/19 0355  INR 1.2   Cardiac Enzymes: No results for input(s): CKTOTAL, CKMB, CKMBINDEX, TROPONINI in the last 168 hours. BNP (last 3 results) No results for input(s): PROBNP in the last 8760 hours. HbA1C: No results for input(s): HGBA1C in the last 72 hours. CBG: Recent Labs  Lab 05/28/19 0007 05/28/19 0355 05/28/19 0817 05/28/19 1114 05/28/19 1650  GLUCAP 117* 118* 121* 121* 97   Lipid Profile: No results for input(s): CHOL, HDL, LDLCALC, TRIG, CHOLHDL, LDLDIRECT in the last 72 hours. Thyroid Function Tests: No results for input(s): TSH, T4TOTAL, FREET4, T3FREE, THYROIDAB in the last 72 hours. Anemia Panel: No results for input(s): VITAMINB12, FOLATE, FERRITIN, TIBC, IRON, RETICCTPCT in the last 72 hours. Sepsis Labs: No results for input(s): PROCALCITON, LATICACIDVEN in the last 168 hours.  No results found for this or any previous visit (from the past 240 hour(s)).       Radiology Studies: No results found.      Scheduled Meds: . amiodarone  200 mg Oral Daily  . amLODipine  10 mg Oral Daily  . bethanechol  5 mg Oral TID  . bumetanide  2 mg Oral BID  . carbamide peroxide  5 drop Both EARS BID  . Chlorhexidine Gluconate Cloth  6 each Topical Q0600  . heparin  5,000 Units Subcutaneous Q8H  . insulin aspart  0-15 Units Subcutaneous Q4H  . levothyroxine  75 mcg Per Tube Q0600  . mouth rinse  15 mL Mouth Rinse BID  . metoprolol tartrate  12.5 mg Oral BID  . nystatin   Topical TID  . Ensure Max Protein  11 oz Oral BID  . rOPINIRole  0.5 mg Oral QHS   Continuous Infusions: . sodium chloride Stopped (05/22/19 1552)     LOS: 14 days     Cordelia Poche, MD [facility] 05/28/2019, 6:43 PM  If 7PM-7AM, please contact night-coverage [email or web address]

## 2019-05-28 NOTE — Progress Notes (Signed)
Physical Therapy Treatment Patient Details Name: Jodi Underwood MRN: [id number] DOB: September 12, 1945 Today's Date: 05/28/2019    History of Present Illness (P) 73 year old female with past medical history of morbid obesity, OSA, hypertension, type 2 diabetes, atrial fibrillation,  Asthma, CKD stage IV, heart failure, hypothyroidism who presents with 1 week of shortness of breath, cough and chest pressure.  Patient intubated in the ED and admitted to ICU10/28/20. acute hypercarbic respiratory failure likely r/t CPAP noncompliance c/b decompensated diastolic heart failure Extubated 05/19/19.     PT Comments    Pt initially refused PT services but agreed to bed mobility due to the fact that she had had a BM. Worked on rolling while cleaning her and her bed. She was able to roll her torso with mod A +2 during the first couple of rolls back and forth, but fatigued quickly. She then required max A +4 in order to maintain side-lying and for repositioning. She noted pain when either of her LEs were lifted for pericare and noted some irritation on her skin on her lower back and buttocks and cream was applied. She was left in bed with HOB elevated. She would benefit from continued skilled PT in order to address impairments.  Follow Up Recommendations  (P) SNF     Equipment Recommendations  (P) Other (comment)(TBD at next venue)    Recommendations for Other Services       Precautions / Restrictions Precautions Precautions: (P) Fall Precaution Comments: (P) family reports multiple falls, causing fear of movement Restrictions Weight Bearing Restrictions: (P) No    Mobility  Bed Mobility Overal bed mobility: (P) Needs Assistance Bed Mobility: (P) Rolling Rolling: (P) Mod assist;Max assist;+2 for physical assistance;+2 for safety/equipment(Mod +2 initially; Max +4 by end of session )         General bed mobility comments: (P) Pt able to reach towards bedrail and pull torso initially with mod +2  assistance, but fatigued after a couple rolls back and forth and then required max +4 assistance for rolling  Transfers                 General transfer comment: deferred  Ambulation/Gait                 Stairs             Wheelchair Mobility    Modified Rankin (Stroke Patients Only)       Balance                                            Cognition Arousal/Alertness: (P) Awake/alert Behavior During Therapy: (P) WFL for tasks assessed/performed Overall Cognitive Status: (P) No family/caregiver present to determine baseline cognitive functioning Area of Impairment: Attention;Following commands;Safety/judgement;Awareness;Problem solving                   Current Attention Level: Sustained   Following Commands: Follows one step commands with increased time;Follows one step commands consistently Safety/Judgement: Decreased awareness of safety;Decreased awareness of deficits Awareness: Intellectual Problem Solving: Slow processing;Decreased initiation;Difficulty sequencing;Requires verbal cues;Requires tactile cues General Comments: (P) Once pt is able to hear command due to [facility], she follows instructions relatively well.       Exercises      General Comments General comments (skin integrity, edema, etc.): Pt on 6L O2 via HFNC, sats remained in 90s throughout session. Foam dressings were  reapplied over areas of macerated skin on lower back and buttock.       Pertinent Vitals/Pain Pain Assessment: Faces Faces Pain Scale: (P) Hurts even more Pain Location: (P) L knee, L shoulder Pain Descriptors / Indicators: (P) Aching;Discomfort;Grimacing;Moaning;Sore Pain Intervention(s): Limited activity within patient's tolerance;Monitored during session;Repositioned    Home Living                      Prior Function            PT Goals (current goals can now be found in the care plan section) Acute Rehab PT Goals Patient  Stated Goal: (P) be comfortable Progress towards PT goals: Progressing toward goals    Frequency    (P) Min 2X/week      PT Plan (P) Current plan remains appropriate    Co-evaluation PT/OT/SLP Co-Evaluation/Treatment: (P) Yes Reason for Co-Treatment: For patient/therapist safety;To address functional/ADL transfers PT goals addressed during session: Mobility/safety with mobility OT goals addressed during session: ADL's and self-care      AM-PAC PT "6 Clicks" Mobility   Outcome Measure  Help needed turning from your back to your side while in a flat bed without using bedrails?: (P) Total Help needed moving from lying on your back to sitting on the side of a flat bed without using bedrails?: (P) Total Help needed moving to and from a bed to a chair (including a wheelchair)?: (P) Total Help needed standing up from a chair using your arms (e.g., wheelchair or bedside chair)?: (P) Total Help needed to walk in hospital room?: (P) Total Help needed climbing 3-5 steps with a railing? : (P) Total 6 Click Score: (P) 6    End of Session Equipment Utilized During Treatment: (P) Oxygen Activity Tolerance: (P) Patient tolerated treatment well Patient left: (P) in bed;with call bell/phone within reach Nurse Communication: (P) Mobility status PT Visit Diagnosis: (P) Other abnormalities of gait and mobility (R26.89);Muscle weakness (generalized) (M62.81);Difficulty in walking, not elsewhere classified (R26.2);Unsteadiness on feet (R26.81)     Time: [id number] PT Time Calculation (min) (ACUTE ONLY): 53 min  Charges:  $Therapeutic Activity: 8-22 mins $Self Care/Home Management: 8-22                    Silvana Newness, SPT    Silvana Newness 05/28/2019, 4:24 PM

## 2019-05-28 NOTE — Progress Notes (Signed)
Nutrition Follow-up  DOCUMENTATION CODES:   Morbid obesity  INTERVENTION:   -Ensure MAX Protein po BID, each supplement provides 150 kcal and 30 grams of protein  NUTRITION DIAGNOSIS:   Inadequate oral intake related to inability to eat as evidenced by NPO status.  Now on regular diet.  GOAL:   Patient will meet greater than or equal to 90% of their needs  Progressing.  MONITOR:   PO intake, Supplement acceptance, Labs, Weight trends, I & O's, Skin  ASSESSMENT:   73 yo female admitted with SOB and worsening LE edema, suspected CHF exacerbation, required intubation. PMH includes asthma, DM, HTN, CHF, neuropathy, lymphedema, A fib, CKD, morbid obesity.  10/27: admitted 10/28-11/2: intubated 11/04 -diet advanced to dysphagia 2 diet -nectar thick liquids 11/06 -diet advanced to regular  **RD working remotely**  Pt now on a regular diet as dysphagia resolved. Pt with decreased appetite given constipation. Now having BMs as of 11/10.  Pt is drinking Ensure Max supplements, will continue.  Admission weight: 400 lbs. Current weight: 456 lbs. Per nursing documentation, pt with severe LE edema. Pt is receiving Bumex.   I/Os: -5.1L since admit UOP: 4925 ml x 24 hrs  Medications: IV Zofran, Bumex tablet Labs reviewed: CBGs: 121  Diet Order:   Diet Order            Diet regular Room service appropriate? Yes; Fluid consistency: Thin; Fluid restriction: 1500 mL Fluid  Diet effective now              EDUCATION NEEDS:   Not appropriate for education at this time  Skin:  Skin Assessment: Skin Integrity Issues: Skin Integrity Issues:: DTI DTI: R buttocks  Last BM:  11/10- type 4  Height:   Ht Readings from Last 1 Encounters:  05/23/19 5\' 6"  (1.676 m)    Weight:   Wt Readings from Last 1 Encounters:  05/24/19 (!) 206.8 kg    Ideal Body Weight:  61.4 kg  BMI:  Body mass index is 73.6 kg/m.  Estimated Nutritional Needs:   Kcal:  2050-2250  Protein:   80-90g  Fluid:  2L/day  Clayton Bibles, MS, RD, LDN Inpatient Clinical Dietitian Pager: [phone] After Hours Pager: [phone]

## 2019-05-29 ENCOUNTER — Inpatient Hospital Stay (HOSPITAL_COMMUNITY): Payer: Medicare Other

## 2019-05-29 LAB — GLUCOSE, CAPILLARY
Glucose-Capillary: 100 mg/dL — ABNORMAL HIGH (ref 70–99)
Glucose-Capillary: 122 mg/dL — ABNORMAL HIGH (ref 70–99)
Glucose-Capillary: 123 mg/dL — ABNORMAL HIGH (ref 70–99)
Glucose-Capillary: 124 mg/dL — ABNORMAL HIGH (ref 70–99)
Glucose-Capillary: 92 mg/dL (ref 70–99)
Glucose-Capillary: 97 mg/dL (ref 70–99)

## 2019-05-29 MED ORDER — PANTOPRAZOLE SODIUM 40 MG PO TBEC
40.0000 mg | DELAYED_RELEASE_TABLET | Freq: Every day | ORAL | Status: DC
  Administered 2019-05-29 – 2019-05-31 (×3): 40 mg via ORAL
  Filled 2019-05-29 (×3): qty 1

## 2019-05-29 NOTE — Progress Notes (Signed)
PROGRESS NOTE    Jodi Underwood  [id number] DOB: [birth year] DOA: 05/13/2019 PCP: Jodi Marble, MD   Brief Narrative: Jodi Underwood is a 73 y.o. female with past medical history significant for obesity, OSA, CKD stage IV. Patient admitted secondary to respiratory failure requiring intubation and mechanical ventilation secondary to CPAP non-adherence in addition to acute heart failure.   Assessment & Plan:   Active Problems:   Respiratory failure (HCC)   Acute respiratory failure with hypoxia and hypercarbia (HCC)   Chest pain   Renal insufficiency   Shortness of breath   Decubitus ulcer of coccygeal region, unspecified pressure ulcer stage   Acute respiratory failure with hypoxia and hypercapnia Patient required intubation with mechanical ventilation from 10/28 until 11/2. Now extubated and on 4 L. Concern etiology is OHS and CPAP non-adherence -Continue CPAP -Wean to room air as able -Chest x-ray  AKI on CKD stage IV Concern for cardiorenal disease. Nephrology consulted. Renal ultrasound with evidence of hydronephrosis. Started on Bumex. AKI improved. Outpatient follow-up per nephrology recommendations.  Acute on chronic diastolic heart failure Contributed to above. Managed on Bumex. Improved. -Continue Bumex  Atrial fibrillation, unspecified -Continue amiodarone, metoprolol  Essential hypertension -Continue metoprolol and amlodipine  Diabetes mellitus, type 2 -Continue SSI  Hypothyroidism -Continue Synthroid  Acute metabolic encephalopathy Resolved.  Lymphedema Chronic  Abdominal pain Unclear etiology. Improved. Possibly related to reflux. Hepatic function not suggestive of biliary pathology -Protonix  Restless leg syndrome Started on Requip  Left ear hearing loss Evaluated by ENT with recommendations for outpatient cerumen removal and formal hearing testing.  Pressure injury Multiple areas of buttock, present on admission     DVT prophylaxis: heparin Code Status:   Code Status: Full Code Family Communication: None at bedside Disposition Plan: Discharge home when stable   Consultants:   PCCM  Nephrology  Procedures:   ETT  Transthoracic Echocardiogram  IMPRESSIONS    1. Left ventricular ejection fraction, by visual estimation, is 60 to 65%. The left ventricle has normal function. There is no left ventricular hypertrophy.  2. Definity contrast agent was given IV to delineate the left ventricular endocardial borders.  3. Elevated left ventricular end-diastolic pressure.  4. Left ventricular diastolic parameters are consistent with Grade II diastolic dysfunction (pseudonormalization).  5. Global right ventricle has normal systolic function.The right ventricular size is normal. No increase in right ventricular wall thickness.  6. Left atrial size was normal.  7. Right atrial size was normal.  8. The mitral valve is normal in structure. No evidence of mitral valve regurgitation. No evidence of mitral stenosis.  9. The tricuspid valve is normal in structure. Tricuspid valve regurgitation is not demonstrated. 10. The aortic valve is normal in structure. Aortic valve regurgitation is not visualized. No evidence of aortic valve sclerosis or stenosis. 11. The pulmonic valve was normal in structure. Pulmonic valve regurgitation is not visualized. 12. The inferior vena cava is dilated in size with >50% respiratory variability, suggesting right atrial pressure of 8 mmHg.  FINDINGS  Left Ventricle: Left ventricular ejection fraction, by visual estimation, is 60 to 65%. The left ventricle has normal function. Definity contrast agent was given IV to delineate the left ventricular endocardial borders. There is no left ventricular  hypertrophy. Left ventricular diastolic parameters are consistent with Grade II diastolic dysfunction (pseudonormalization). Elevated left ventricular end-diastolic pressure.  Right  Ventricle: The right ventricular size is normal. No increase in right ventricular wall thickness. Global RV systolic function is has normal systolic function.  Left Atrium: Left atrial size was normal in size.  Right Atrium: Right atrial size was normal in size  Pericardium: There is no evidence of pericardial effusion.  Mitral Valve: The mitral valve is normal in structure. No evidence of mitral valve stenosis by observation. No evidence of mitral valve regurgitation.  Tricuspid Valve: The tricuspid valve is normal in structure. Tricuspid valve regurgitation is not demonstrated.  Aortic Valve: The aortic valve is normal in structure. Aortic valve regurgitation is not visualized. The aortic valve is structurally normal, with no evidence of sclerosis or stenosis.  Pulmonic Valve: The pulmonic valve was normal in structure. Pulmonic valve regurgitation is not visualized.  Aorta: The aortic root, ascending aorta and aortic arch are all structurally normal, with no evidence of dilitation or obstruction.  Venous: The inferior vena cava is dilated in size with greater than 50% respiratory variability, suggesting right atrial pressure of 8 mmHg.  IAS/Shunts: No atrial level shunt detected by color flow Doppler. No ventricular septal defect is seen or detected. There is no evidence of an atrial septal defect.  Antimicrobials:  Ceftriaxone  Azithromycin    Subjective: Epigastric pain improving.   Objective: Vitals:   05/29/19 0729 05/29/19 1123 05/29/19 1350 05/29/19 1356  BP: (!) 132/45     Pulse: 62     Resp:      Temp: 97.6 F (36.4 C)     TempSrc:      SpO2: 94% 99% (!) 84% 97%  Weight:      Height:        Intake/Output Summary (Last 24 hours) at 05/29/2019 1523 Last data filed at 05/29/2019 [id number] Gross per 24 hour  Intake 360 ml  Output 2650 ml  Net -2290 ml   Filed Weights   05/23/19 2142 05/24/19 0600 05/29/19 0500  Weight: (!) 177.6 kg (!) 206.8 kg (!)  203.7 kg    Examination:  General exam: Appears calm and comfortable Respiratory system: Clear to auscultation. Respiratory effort normal. Cardiovascular system: S1 & S2 heard, RRR. No murmurs, rubs, gallops or clicks. Gastrointestinal system: Abdomen is nondistended, soft and nontender. Obese. No organomegaly or masses felt. Normal bowel sounds heard Central nervous system: Alert and oriented. No focal neurological deficits. Extremities: Lymphedema. No calf tenderness Skin: No cyanosis. No rashes Psychiatry: Judgement and insight appear normal. Mood & affect appropriate.    Data Reviewed: I have personally reviewed following labs and imaging studies  CBC: Recent Labs  Lab 05/23/19 0356 05/24/19 0352 05/25/19 0448 05/26/19 0435  WBC 6.7 7.9 9.4 9.9  HGB 7.7* 7.8* 8.1* 8.2*  HCT 28.3* 28.5* 29.3* 30.1*  MCV 97.6 100.0 99.0 99.3  PLT 281 292 309 Q000111Q   Basic Metabolic Panel: Recent Labs  Lab 05/23/19 0356 05/24/19 0352 05/25/19 0448 05/26/19 0435 05/27/19 0406 05/28/19 0350  NA 145 149* 147* 145 144 144  K 4.4 4.3 4.0 3.9 3.9 3.8  CL 106 108 105 101 100 98  CO2 29 31 29  32 34* 34*  GLUCOSE 124* 134* 143* 132* 146* 130*  BUN 123* 116* 109* 97* 88* 80*  CREATININE 4.91* 4.53* 4.25* 4.06* 3.73* 3.57*  CALCIUM 9.1 9.1 9.2 9.1 9.2 9.0  PHOS 5.0* 4.9* 4.1 4.1  --   --    GFR: Estimated Creatinine Clearance: 25.6 mL/min (A) (by C-G formula based on SCr of 3.57 mg/dL (H)). Liver Function Tests: Recent Labs  Lab 05/24/19 0352 05/25/19 0448 05/26/19 0435 05/27/19 0406 05/28/19 1903  AST  --   --   --  12* 14*  ALT  --   --   --  15 13  ALKPHOS  --   --   --  85 68  BILITOT  --   --   --  0.7 0.8  PROT  --   --   --  6.8 6.4*  ALBUMIN 2.6* 2.7* 2.7* 2.6* 2.7*   No results for input(s): LIPASE, AMYLASE in the last 168 hours. No results for input(s): AMMONIA in the last 168 hours. Coagulation Profile: No results for input(s): INR, PROTIME in the last 168 hours.  Cardiac Enzymes: No results for input(s): CKTOTAL, CKMB, CKMBINDEX, TROPONINI in the last 168 hours. BNP (last 3 results) No results for input(s): PROBNP in the last 8760 hours. HbA1C: No results for input(s): HGBA1C in the last 72 hours. CBG: Recent Labs  Lab 05/28/19 1913 05/28/19 2339 05/29/19 0327 05/29/19 0730 05/29/19 1144  GLUCAP 104* 107* 124* 122* 100*   Lipid Profile: No results for input(s): CHOL, HDL, LDLCALC, TRIG, CHOLHDL, LDLDIRECT in the last 72 hours. Thyroid Function Tests: No results for input(s): TSH, T4TOTAL, FREET4, T3FREE, THYROIDAB in the last 72 hours. Anemia Panel: No results for input(s): VITAMINB12, FOLATE, FERRITIN, TIBC, IRON, RETICCTPCT in the last 72 hours. Sepsis Labs: No results for input(s): PROCALCITON, LATICACIDVEN in the last 168 hours.  No results found for this or any previous visit (from the past 240 hour(s)).       Radiology Studies: No results found.      Scheduled Meds: . amiodarone  200 mg Oral Daily  . amLODipine  10 mg Oral Daily  . bethanechol  5 mg Oral TID  . bumetanide  2 mg Oral BID  . carbamide peroxide  5 drop Both EARS BID  . Chlorhexidine Gluconate Cloth  6 each Topical Q0600  . heparin  5,000 Units Subcutaneous Q8H  . insulin aspart  0-15 Units Subcutaneous Q4H  . levothyroxine  75 mcg Per Tube Q0600  . mouth rinse  15 mL Mouth Rinse BID  . metoprolol tartrate  12.5 mg Oral BID  . nystatin   Topical TID  . pantoprazole  40 mg Oral Daily  . Ensure Max Protein  11 oz Oral BID  . rOPINIRole  0.5 mg Oral QHS   Continuous Infusions: . sodium chloride Stopped (05/22/19 1552)     LOS: 15 days     Cordelia Poche, MD [facility] 05/29/2019, 3:23 PM  If 7PM-7AM, please contact night-coverage [email or web address]

## 2019-05-29 NOTE — Plan of Care (Signed)

## 2019-05-29 NOTE — Progress Notes (Signed)
Spoke with RN about patient not wanting to go on CPAP at this time.  Patient stated she did not want to go on till about 1am.  Asked RN to call me when patient was ready to go on.  Will continue to monitor.

## 2019-05-30 LAB — GLUCOSE, CAPILLARY
Glucose-Capillary: 106 mg/dL — ABNORMAL HIGH (ref 70–99)
Glucose-Capillary: 106 mg/dL — ABNORMAL HIGH (ref 70–99)
Glucose-Capillary: 110 mg/dL — ABNORMAL HIGH (ref 70–99)
Glucose-Capillary: 114 mg/dL — ABNORMAL HIGH (ref 70–99)
Glucose-Capillary: 117 mg/dL — ABNORMAL HIGH (ref 70–99)
Glucose-Capillary: 118 mg/dL — ABNORMAL HIGH (ref 70–99)

## 2019-05-30 MED ORDER — SIMETHICONE 80 MG PO CHEW
80.0000 mg | CHEWABLE_TABLET | Freq: Once | ORAL | Status: AC
  Administered 2019-05-30: 80 mg via ORAL
  Filled 2019-05-30: qty 1

## 2019-05-30 MED ORDER — FLUTICASONE PROPIONATE 50 MCG/ACT NA SUSP
2.0000 | Freq: Every day | NASAL | Status: DC
  Administered 2019-05-31: 2 via NASAL
  Filled 2019-05-30: qty 16

## 2019-05-30 NOTE — Care Management (Addendum)
Per bedside nurse pt is now oriented. CM spoke with pt via phone and bedside nurse - pt is extremely HOH.  Pt confirms she is not interested in going to a SNF.  Pt is in agreement for home discharge with [facility] - pt informed CM that the choice of agency is [facility].  CM made referral to agency - agency to review for acceptance.   CM requested [facility] orders    Update 17:43 - [facility] declined pt.  Weekend CM will follow up with pt over weekend to get second choice and secure [facility] for pt

## 2019-05-30 NOTE — Progress Notes (Signed)
Went back to place patient on CPAP.  Patient stated she was not going to use it tonight because she had some upper respiratory issues going on and she was staying so dry.  She stated that she was gonna give it one more day and try again tomorrow night.  No distress noted at this time, will continue to monitor.

## 2019-05-30 NOTE — Progress Notes (Signed)
PROGRESS NOTE    Jodi Underwood  [id number] DOB: 04/22/1946 DOA: 05/13/2019 PCP: Jodi Marble, MD   Brief Narrative: Jodi Underwood is a 73 y.o. female with past medical history significant for obesity, OSA, CKD stage IV. Patient admitted secondary to respiratory failure requiring intubation and mechanical ventilation secondary to CPAP non-adherence in addition to acute heart failure.   Assessment & Plan:   Active Problems:   Respiratory failure (HCC)   Acute respiratory failure with hypoxia and hypercarbia (HCC)   Chest pain   Renal insufficiency   Shortness of breath   Decubitus ulcer of coccygeal region, unspecified pressure ulcer stage   Acute on chronic respiratory failure with hypoxia and hypercapnia Patient required intubation with mechanical ventilation from 10/28 until 11/2. Now extubated and on 4 L. Concern etiology is OHS and CPAP non-adherence. Weaned to home 2 L. -Continue CPAP  AKI on CKD stage IV Concern for cardiorenal disease. Nephrology consulted. Renal ultrasound with evidence of hydronephrosis. Started on Bumex. AKI improved. Outpatient follow-up per nephrology recommendations.  Acute urinary retention Foley placed. -Voiding trial  Nasal congestion -Increase to Flonase 2 sprays each nare scheduled  Acute on chronic diastolic heart failure Contributed to above. Managed on Bumex. Improved. -Continue Bumex  Atrial fibrillation, unspecified -Continue amiodarone, metoprolol  Essential hypertension -Continue metoprolol and amlodipine  Diabetes mellitus, type 2 -Continue SSI  Hypothyroidism -Continue Synthroid  Acute metabolic encephalopathy Resolved.  Lymphedema Chronic  Abdominal pain Unclear etiology. Improved. Possibly related to reflux. Hepatic function not suggestive of biliary pathology -Protonix  Restless leg syndrome Started on Requip  Left ear hearing loss Evaluated by ENT with recommendations for outpatient  cerumen removal and formal hearing testing.  Pressure injury Multiple areas of buttock, present on admission    DVT prophylaxis: heparin Code Status:   Code Status: Full Code Family Communication: None at bedside Disposition Plan: Discharge home in 24 hours pending voiding trial   Consultants:   PCCM  Nephrology  Procedures:   ETT  Transthoracic Echocardiogram  IMPRESSIONS    1. Left ventricular ejection fraction, by visual estimation, is 60 to 65%. The left ventricle has normal function. There is no left ventricular hypertrophy.  2. Definity contrast agent was given IV to delineate the left ventricular endocardial borders.  3. Elevated left ventricular end-diastolic pressure.  4. Left ventricular diastolic parameters are consistent with Grade II diastolic dysfunction (pseudonormalization).  5. Global right ventricle has normal systolic function.The right ventricular size is normal. No increase in right ventricular wall thickness.  6. Left atrial size was normal.  7. Right atrial size was normal.  8. The mitral valve is normal in structure. No evidence of mitral valve regurgitation. No evidence of mitral stenosis.  9. The tricuspid valve is normal in structure. Tricuspid valve regurgitation is not demonstrated. 10. The aortic valve is normal in structure. Aortic valve regurgitation is not visualized. No evidence of aortic valve sclerosis or stenosis. 11. The pulmonic valve was normal in structure. Pulmonic valve regurgitation is not visualized. 12. The inferior vena cava is dilated in size with >50% respiratory variability, suggesting right atrial pressure of 8 mmHg.  FINDINGS  Left Ventricle: Left ventricular ejection fraction, by visual estimation, is 60 to 65%. The left ventricle has normal function. Definity contrast agent was given IV to delineate the left ventricular endocardial borders. There is no left ventricular  hypertrophy. Left ventricular diastolic  parameters are consistent with Grade II diastolic dysfunction (pseudonormalization). Elevated left ventricular end-diastolic pressure.  Right Ventricle: The  right ventricular size is normal. No increase in right ventricular wall thickness. Global RV systolic function is has normal systolic function.  Left Atrium: Left atrial size was normal in size.  Right Atrium: Right atrial size was normal in size  Pericardium: There is no evidence of pericardial effusion.  Mitral Valve: The mitral valve is normal in structure. No evidence of mitral valve stenosis by observation. No evidence of mitral valve regurgitation.  Tricuspid Valve: The tricuspid valve is normal in structure. Tricuspid valve regurgitation is not demonstrated.  Aortic Valve: The aortic valve is normal in structure. Aortic valve regurgitation is not visualized. The aortic valve is structurally normal, with no evidence of sclerosis or stenosis.  Pulmonic Valve: The pulmonic valve was normal in structure. Pulmonic valve regurgitation is not visualized.  Aorta: The aortic root, ascending aorta and aortic arch are all structurally normal, with no evidence of dilitation or obstruction.  Venous: The inferior vena cava is dilated in size with greater than 50% respiratory variability, suggesting right atrial pressure of 8 mmHg.  IAS/Shunts: No atrial level shunt detected by color flow Doppler. No ventricular septal defect is seen or detected. There is no evidence of an atrial septal defect.  Antimicrobials:  Ceftriaxone  Azithromycin    Subjective: Feels pressure around sinuses  Objective: Vitals:   05/29/19 1356 05/29/19 1628 05/29/19 2358 05/30/19 0840  BP:  (!) 146/56 (!) 147/55 (!) 153/60  Pulse:  60 (!) 57 (!) 58  Resp:   18 16  Temp:  (!) 97.5 F (36.4 C) 97.8 F (36.6 C) (!) 96.7 F (35.9 C)  TempSrc:  Oral    SpO2: 97% 100% 100% 100%  Weight:      Height:        Intake/Output Summary (Last 24  hours) at 05/30/2019 1537 Last data filed at 05/30/2019 0800 Gross per 24 hour  Intake 960 ml  Output 2450 ml  Net -1490 ml   Filed Weights   05/23/19 2142 05/24/19 0600 05/29/19 0500  Weight: (!) 177.6 kg (!) 206.8 kg (!) 203.7 kg    Examination:  General exam: Appears calm and comfortable HEENT: no frontal/maxillary sinus tenderness Respiratory system: Clear to auscultation. Respiratory effort normal. Cardiovascular system: S1 & S2 heard, RRR. No murmurs, rubs, gallops or clicks. Gastrointestinal system: Abdomen is nondistended, soft and nontender. No organomegaly or masses felt. Normal bowel sounds heard. Central nervous system: Alert and oriented. No focal neurological deficits. Extremities: Lymphedema. No calf tenderness Skin: No cyanosis. No rashes Psychiatry: Judgement and insight appear normal. Mood & affect appropriate.     Data Reviewed: I have personally reviewed following labs and imaging studies  CBC: Recent Labs  Lab 05/24/19 0352 05/25/19 0448 05/26/19 0435  WBC 7.9 9.4 9.9  HGB 7.8* 8.1* 8.2*  HCT 28.5* 29.3* 30.1*  MCV 100.0 99.0 99.3  PLT 292 309 Q000111Q   Basic Metabolic Panel: Recent Labs  Lab 05/24/19 0352 05/25/19 0448 05/26/19 0435 05/27/19 0406 05/28/19 0350  NA 149* 147* 145 144 144  K 4.3 4.0 3.9 3.9 3.8  CL 108 105 101 100 98  CO2 31 29 32 34* 34*  GLUCOSE 134* 143* 132* 146* 130*  BUN 116* 109* 97* 88* 80*  CREATININE 4.53* 4.25* 4.06* 3.73* 3.57*  CALCIUM 9.1 9.2 9.1 9.2 9.0  PHOS 4.9* 4.1 4.1  --   --    GFR: Estimated Creatinine Clearance: 25.6 mL/min (A) (by C-G formula based on SCr of 3.57 mg/dL (H)). Liver Function Tests: Recent  Labs  Lab 05/24/19 0352 05/25/19 0448 05/26/19 0435 05/27/19 0406 05/28/19 1903  AST  --   --   --  12* 14*  ALT  --   --   --  15 13  ALKPHOS  --   --   --  85 68  BILITOT  --   --   --  0.7 0.8  PROT  --   --   --  6.8 6.4*  ALBUMIN 2.6* 2.7* 2.7* 2.6* 2.7*   No results for input(s):  LIPASE, AMYLASE in the last 168 hours. No results for input(s): AMMONIA in the last 168 hours. Coagulation Profile: No results for input(s): INR, PROTIME in the last 168 hours. Cardiac Enzymes: No results for input(s): CKTOTAL, CKMB, CKMBINDEX, TROPONINI in the last 168 hours. BNP (last 3 results) No results for input(s): PROBNP in the last 8760 hours. HbA1C: No results for input(s): HGBA1C in the last 72 hours. CBG: Recent Labs  Lab 05/29/19 1943 05/29/19 2352 05/30/19 0402 05/30/19 0833 05/30/19 1202  GLUCAP 92 97 106* 117* 114*   Lipid Profile: No results for input(s): CHOL, HDL, LDLCALC, TRIG, CHOLHDL, LDLDIRECT in the last 72 hours. Thyroid Function Tests: No results for input(s): TSH, T4TOTAL, FREET4, T3FREE, THYROIDAB in the last 72 hours. Anemia Panel: No results for input(s): VITAMINB12, FOLATE, FERRITIN, TIBC, IRON, RETICCTPCT in the last 72 hours. Sepsis Labs: No results for input(s): PROCALCITON, LATICACIDVEN in the last 168 hours.  No results found for this or any previous visit (from the past 240 hour(s)).       Radiology Studies: Dg Chest Port 1 View  Result Date: 05/29/2019 CLINICAL DATA:  Acute respiratory failure with hypoxia. EXAM: PORTABLE CHEST 1 VIEW COMPARISON:  05/24/2019 FINDINGS: The heart is enlarged but appears stable. Very prominent mediastinal and hilar contours are unchanged and may be due to enlarge pulmonary arteries. Chronic bronchitic lung changes and vascular congestion but I do not see any overt pulmonary edema or large pleural effusions. IMPRESSION: Stable cardiac enlargement and very prominent mediastinal and hilar contours. Chronic bronchitic lung changes and moderate vascular congestion but no overt pulmonary edema or large pleural effusions. Electronically Signed   By: Marijo Sanes M.D.   On: 05/29/2019 16:22        Scheduled Meds: . amiodarone  200 mg Oral Daily  . amLODipine  10 mg Oral Daily  . bethanechol  5 mg Oral TID   . bumetanide  2 mg Oral BID  . carbamide peroxide  5 drop Both EARS BID  . Chlorhexidine Gluconate Cloth  6 each Topical Q0600  . [START ON 05/31/2019] fluticasone  2 spray Each Nare Daily  . heparin  5,000 Units Subcutaneous Q8H  . insulin aspart  0-15 Units Subcutaneous Q4H  . levothyroxine  75 mcg Per Tube Q0600  . mouth rinse  15 mL Mouth Rinse BID  . metoprolol tartrate  12.5 mg Oral BID  . nystatin   Topical TID  . pantoprazole  40 mg Oral Daily  . Ensure Max Protein  11 oz Oral BID  . rOPINIRole  0.5 mg Oral QHS   Continuous Infusions: . sodium chloride Stopped (05/22/19 1552)     LOS: 16 days     Cordelia Poche, MD [facility] 05/30/2019, 3:37 PM  If 7PM-7AM, please contact night-coverage [email or web address]

## 2019-05-31 LAB — GLUCOSE, CAPILLARY
Glucose-Capillary: 118 mg/dL — ABNORMAL HIGH (ref 70–99)
Glucose-Capillary: 128 mg/dL — ABNORMAL HIGH (ref 70–99)
Glucose-Capillary: 115 mg/dL — ABNORMAL HIGH (ref 70–99)
Glucose-Capillary: 112 mg/dL — ABNORMAL HIGH (ref 70–99)

## 2019-05-31 MED ORDER — POLYETHYLENE GLYCOL 3350 17 G PO PACK: 17.0000 g | PACK | Freq: Every day | ORAL | 0 refills | Status: AC | PRN

## 2019-05-31 MED ORDER — FLUTICASONE PROPIONATE 50 MCG/ACT NA SUSP: 2.0000 | Freq: Every day | NASAL | 0 refills | Status: AC

## 2019-05-31 MED ORDER — PANTOPRAZOLE SODIUM 40 MG PO TBEC: 40.0000 mg | DELAYED_RELEASE_TABLET | Freq: Every day | ORAL | 0 refills | Status: AC

## 2019-05-31 MED ORDER — NOVOLIN N FLEXPEN RELION 100 UNIT/ML ~~LOC~~ SUPN: 5.0000 [IU] | PEN_INJECTOR | Freq: Two times a day (BID) | SUBCUTANEOUS | 0 refills | Status: AC

## 2019-05-31 MED ORDER — BUMETANIDE 2 MG PO TABS: 2.0000 mg | ORAL_TABLET | Freq: Two times a day (BID) | ORAL | 0 refills | Status: AC

## 2019-05-31 MED ORDER — AMLODIPINE BESYLATE 10 MG PO TABS: 10.0000 mg | ORAL_TABLET | Freq: Every day | ORAL | 0 refills | Status: AC

## 2019-05-31 MED ORDER — SIMETHICONE 80 MG PO CHEW
80.0000 mg | CHEWABLE_TABLET | Freq: Once | ORAL | Status: AC
  Administered 2019-05-31: 80 mg via ORAL
  Filled 2019-05-31: qty 1

## 2019-05-31 NOTE — Progress Notes (Signed)
Patient alert and oriented but reports feeling anxious after ended period of being on ci-pap. Switched patient back to [place]. Continue to monitor.

## 2019-05-31 NOTE — Discharge Summary (Signed)
Physician Discharge Summary  Jodi Underwood [id number] DOB: [birth year] DOA: 05/13/2019  PCP: Jodi Marble, MD  Admit date: 05/13/2019 Discharge date: 05/31/2019  Admitted From: Home Disposition: Home  Recommendations for Outpatient Follow-up:  1. Follow up with PCP in 1 week 2. Follow up with ENT 3. Please obtain BMP/CBC in one week 4. Please follow up on the following pending results: None  Home Health: PT, OT, RN, CSW, HHaide Equipment/Devices: None  Discharge Condition: Stable CODE STATUS: Full code Diet recommendation: Heart healthy/carb modified   Brief/Interim Summary:  Admission HPI written by Mickel Baas Gleason, PA-C   History of present illness  73 year old female with past medical history of morbid obesity, OSA, hypertension, type 2 diabetes, atrial fibrillation,  Asthma, CKD stage IV, heart failure, hypothyroidism who presents with 1 week of shortness of breath, cough and chest pressure.  Patient intubated in the ED and unable to reach family so history taken from epic.  Was brought in by family who noted worsening lower extremity edema and that patient is noncompliant with her medications and CPAP.  This arrived patient's oxygen saturation was 90% on room air.   On arrival, she was awake but somnolent and disoriented; she was afebrile, blood pressure 173/70 and not hypoxic.  An ABG was drawn, which did not crossover into epic, but results noted by RT: PH 7.21, CO2>97, PAO2 61.  An order was placed for BiPAP, but this is not placed pending Covid results.  Patient began to decompensate and was intubated in the emergency department.  Labs were significant for white blood cell count of 12.3, hemoglobin 8.4, creatinine 3.0 (baseline), Covid-19 negative, BNP 249, and troponin 42.   Chest x-ray with likely volume overload, but unable to rule out pneumonia.   Hospital course:  Acute on chronic respiratory failure with hypoxia and hypercapnia Patient required  intubation with mechanical ventilation from 10/28 until 11/2. Now extubated and on 4 L. Concern etiology is OHS and CPAP non-adherence. Weaned to home 2 L. Continue CPAP.  AKI on CKD stage IV Concern for cardiorenal disease. Nephrology consulted. Renal ultrasound with evidence of hydronephrosis. Started on Bumex. AKI improved. Outpatient follow-up per nephrology recommendations.  Acute urinary retention Foley placed and eventually removed with ability to self-void.  Nasal congestion Flonase  Acute on chronic diastolic heart failure Contributed to above. Managed on Bumex. Improved. Continue Bumex as an increased dose of BID.  Atrial fibrillation, unspecified Continue amiodarone and metoprolol  Essential hypertension Continue metoprolol and amlodipine  Diabetes mellitus, type 2 Will resume home NPH at reduced dose of 5 units BID. Titrate up as needed.  Hypothyroidism Continue Synthroid  Acute metabolic encephalopathy Resolved.  Lymphedema Chronic  Abdominal pain Unclear etiology. Possibly related to reflux. Hepatic function not suggestive of biliary pathology. Resolved.  ?Restless leg syndrome Started on Requip. Will not continue on discharge. Follow-up with PCP.  Left ear hearing loss Evaluated by ENT with recommendations for outpatient cerumen removal and formal hearing testing.  Pressure injury Multiple areas of buttock, present on admission. Home health nursing.   Discharge Diagnoses:  Active Problems:   Respiratory failure (HCC)   Acute respiratory failure with hypoxia and hypercarbia (HCC)   Chest pain   Renal insufficiency   Shortness of breath   Decubitus ulcer of coccygeal region, unspecified pressure ulcer stage    Discharge Instructions   Allergies as of 05/31/2019      Reactions   Ciprofloxacin Other (See Comments)   Hallucinations   Furosemide  Patient's daughter states Lasix/Furosemide contributed to her mother's renal  problems and prefers Bumex.   Warfarin And Related Other (See Comments)   "Did not work well with her," per daughter      Medication List    STOP taking these medications   naproxen sodium 220 MG tablet Commonly known as: ALEVE   potassium chloride 10 MEQ tablet Commonly known as: KLOR-CON     TAKE these medications   acetaminophen 325 MG tablet Commonly known as: TYLENOL Take 325-650 mg by mouth every 6 (six) hours as needed (For pain.).   albuterol 108 (90 Base) MCG/ACT inhaler Commonly known as: VENTOLIN HFA Inhale 2 puffs into the lungs 2 (two) times daily as needed for wheezing or shortness of breath.   amiodarone 200 MG tablet Commonly known as: PACERONE Take 200 mg by mouth daily after breakfast.   amLODipine 10 MG tablet Commonly known as: NORVASC Take 1 tablet (10 mg total) by mouth daily. Start taking on: June 01, 2019   bumetanide 2 MG tablet Commonly known as: BUMEX Take 1 tablet (2 mg total) by mouth 2 (two) times daily. What changed: when to take this   cholestyramine light 4 g packet Commonly known as: PREVALITE Take 1 packet (4 g total) by mouth 3 (three) times daily.   cyclobenzaprine 10 MG tablet Commonly known as: FLEXERIL Take 10 mg by mouth every 6 (six) hours as needed for muscle spasms.   diphenhydramine-acetaminophen 25-500 MG Tabs tablet Commonly known as: TYLENOL PM Take 1 tablet by mouth at bedtime as needed (for sleep).   Euthyrox 25 MCG tablet Generic drug: levothyroxine Take 12.5 mcg by mouth daily before breakfast.   Euthyrox 50 MCG tablet Generic drug: levothyroxine Take 50 mcg by mouth daily before breakfast.   fluticasone 50 MCG/ACT nasal spray Commonly known as: Flonase Place 2 sprays into both nostrils daily.   Gerhardt's butt cream Crea Apply 1 application topically 2 (two) times daily.   guaiFENesin 600 MG 12 hr tablet Commonly known as: MUCINEX Take 600 mg by mouth 2 (two) times daily as needed for to loosen  phlegm.   metoprolol tartrate 25 MG tablet Commonly known as: LOPRESSOR Take 0.5 tablets (12.5 mg total) by mouth 2 (two) times daily.   montelukast 10 MG tablet Commonly known as: SINGULAIR Take 10 mg by mouth daily.   NovoLIN N FlexPen ReliOn 100 UNIT/ML Kiwkpen Generic drug: Insulin NPH (Human) (Isophane) Inject 5 Units into the skin 2 (two) times daily. What changed: how much to take   nystatin cream Commonly known as: MYCOSTATIN Apply 1 application topically 2 (two) times daily.   pantoprazole 40 MG tablet Commonly known as: PROTONIX Take 1 tablet (40 mg total) by mouth daily. Start taking on: June 01, 2019   polyethylene glycol 17 g packet Commonly known as: MIRALAX / GLYCOLAX Take 17 g by mouth daily as needed for mild constipation.   promethazine 25 MG tablet Commonly known as: PHENERGAN Take 25 mg by mouth every 6 (six) hours as needed for nausea or vomiting.   saccharomyces boulardii 250 MG capsule Commonly known as: FLORASTOR Take 1 capsule (250 mg total) by mouth 3 (three) times daily.            Durable Medical Equipment  (From admission, onward)         Start     Ordered   05/31/19 1445  For home use only DME 3 n 1  Once     05/31/19 1445  Follow-up Information    [facility].   Specialty: Home Health Services Why: For home health services. They will contact you in 1-2 days to set up your first home appointment.        Jodi Marble, MD. Schedule an appointment as soon as possible for a visit in 1 week(s).   Specialty: Internal Medicine Contact information: [place] [address] [phone]        Leta Baptist, MD Follow up.   Specialty: Otolaryngology Why: Hearing loss Contact information: [place] [address] [phone]          Allergies  Allergen Reactions   Ciprofloxacin Other (See Comments)    Hallucinations   Furosemide      Patient's daughter states Lasix/Furosemide contributed to her mother's renal problems and prefers Bumex.   Warfarin And Related Other (See Comments)    "Did not work well with her," per daughter    Consultations:  PCCM  Nephrology   Procedures/Studies: Ct Head Wo Contrast  Result Date: 05/13/2019 CLINICAL DATA:  Shortness of breath, cough for 1 week and chest pressure, lower extremity edema. EXAM: CT HEAD WITHOUT CONTRAST TECHNIQUE: Contiguous axial images were obtained from the base of the skull through the vertex without intravenous contrast. COMPARISON:  Radiograph December 24, 2017 FINDINGS: Imaging quality is motion degraded. Brain: No evidence of acute infarction, hemorrhage, hydrocephalus, extra-axial collection or mass lesion/mass effect. Patchy areas of white matter hypoattenuation are most compatible with chronic microvascular angiopathy. Vascular: Atherosclerotic calcification of the carotid siphons. No hyperdense vessel. Skull: Hyperostosis frontalis interna, a benign incidental finding. No calvarial fracture or suspicious osseous lesion. No scalp swelling or hematoma. Sinuses/Orbits: Paranasal sinuses and mastoid air cells are predominantly clear. Orbits are largely obscured by motion artifact. Other: None IMPRESSION: Motion degraded images limit evaluation of the skull base and orbits. No acute intracranial abnormality. Electronically Signed   By: Lovena Le M.D.   On: 05/13/2019 23:07   US Renal  Result Date: 05/17/2019 CLINICAL DATA:  Acute kidney injury. EXAM: RENAL / URINARY TRACT ULTRASOUND COMPLETE COMPARISON:  None. FINDINGS: Right Kidney: Renal measurements: 9.8 x 5.0 x 4.3 cm = volume: 111 mL. Small kidney with cortical atrophy. No hydronephrosis or focal lesions. Left Kidney: Renal measurements: 9.9 x 5.1 x 6.3 cm = volume: 165 mL. 3 cm simple cyst of the upper pole. No hydronephrosis or solid masses. Bladder: The bladder was not visualized and is likely decompressed.  IMPRESSION: Smaller right kidney compared to the left.  No hydronephrosis. Electronically Signed   By: Aletta Edouard M.D.   On: 05/17/2019 11:50   Dg Chest Port 1 View  Result Date: 05/29/2019 CLINICAL DATA:  Acute respiratory failure with hypoxia. EXAM: PORTABLE CHEST 1 VIEW COMPARISON:  05/24/2019 FINDINGS: The heart is enlarged but appears stable. Very prominent mediastinal and hilar contours are unchanged and may be due to enlarge pulmonary arteries. Chronic bronchitic lung changes and vascular congestion but I do not see any overt pulmonary edema or large pleural effusions. IMPRESSION: Stable cardiac enlargement and very prominent mediastinal and hilar contours. Chronic bronchitic lung changes and moderate vascular congestion but no overt pulmonary edema or large pleural effusions. Electronically Signed   By: Marijo Sanes M.D.   On: 05/29/2019 16:22   Dg Chest Port 1 View  Result Date: 05/24/2019 CLINICAL DATA:  Shortness of breath. EXAM: PORTABLE CHEST 1 VIEW COMPARISON:  05/17/2019 FINDINGS: Patient is slightly rotated to  the right. Lungs are adequately inflated with suggestion of minimal vascular congestion. Vertical linear density overlying the right side of the cardiomediastinal silhouette likely atelectasis. No lobar consolidation or effusion. Stable cardiomegaly. Remainder of the exam is unchanged. IMPRESSION: Density over the medial right mid to lower lung likely atelectasis. Cardiomegaly and suggestion of mild vascular congestion. Electronically Signed   By: Marin Olp M.D.   On: 05/24/2019 11:33   Dg Chest Port 1 View  Result Date: 05/17/2019 CLINICAL DATA:  Respiratory failure and mucous plugging. EXAM: PORTABLE CHEST 1 VIEW COMPARISON:  05/16/2019 FINDINGS: Endotracheal tube remains with the tip approximately 5 cm above the carina. Gastric decompression tube extends below the diaphragm. Residual opacity in the right medial lung likely corresponds to right lower lobe and also  potentially right middle lobe atelectasis. No significant pleural fluid or evidence of pneumothorax. IMPRESSION: Residual opacity in the right medial lung likely corresponds to right lower lobe and right middle lobe atelectasis. Endotracheal tube remains 5 cm above the carina. Electronically Signed   By: Aletta Edouard M.D.   On: 05/17/2019 11:40   Dg Chest Port 1 View  Result Date: 05/16/2019 CLINICAL DATA:  Acute respiratory failure EXAM: PORTABLE CHEST 1 VIEW COMPARISON:  Two days ago FINDINGS: Endotracheal tube tip at the clavicular heads. The orogastric tube at least reaches the stomach. Limited by body habitus. There is patchy bilateral pulmonary opacity with worsening visualization of the right diaphragm. Cardiomegaly and vascular pedicle widening accentuated by rotation. IMPRESSION: 1. Higher endotracheal tube, tip at the clavicular heads. 2. Bilateral pulmonary opacity with right lower lobe worsening. Electronically Signed   By: Monte Fantasia M.D.   On: 05/16/2019 05:11   Dg Chest Port 1 View  Result Date: 05/14/2019 CLINICAL DATA:  Edema, history diabetes mellitus, hypertension, atrial fibrillation, asthma, chronic kidney disease EXAM: PORTABLE CHEST 1 VIEW COMPARISON:  Portable exam 0545 hours compared to 0101 hours FINDINGS: Tip of endotracheal tube projects approximately 5.1 cm above carina. Nasogastric tube extends into stomach. Enlargement of cardiac silhouette. Persistent infiltrates asymmetrically greater on LEFT, little changed. No pleural effusion or pneumothorax. Bones demineralized. IMPRESSION: Persistent asymmetric infiltrates greater on LEFT little changed. Enlargement of cardiac silhouette with vascular congestion. Stable support tubes. Electronically Signed   By: Lavonia Dana M.D.   On: 05/14/2019 07:28   Dg Chest Port 1 View  Result Date: 05/14/2019 CLINICAL DATA:  Status post intubation EXAM: PORTABLE CHEST 1 VIEW COMPARISON:  05/13/2019 FINDINGS: Cardiac shadow is  prominent but stable from the prior exam. Endotracheal tube and gastric catheter are noted in satisfactory position. Persistent patchy opacities are again identified but slightly improved when compared with the prior exam likely representing a combination of vascular congestion and edema. No pneumothorax is seen. No bony abnormality is noted. IMPRESSION: Slight improved aeration. Tubes and lines in satisfactory position. Electronically Signed   By: Inez Catalina M.D.   On: 05/14/2019 01:21   Dg Chest Portable 1 View  Result Date: 05/13/2019 CLINICAL DATA:  73 year old female with chest pain and shortness of breath and cough. EXAM: PORTABLE CHEST 1 VIEW COMPARISON:  Chest radiograph dated 02/01/2018 FINDINGS: There is cardiomegaly with vascular congestion and edema. Pneumonia is not excluded. Clinical correlation is recommended. The patient is rotated which limits evaluation of the exam. No large pleural effusion or pneumothorax. No acute osseous pathology. IMPRESSION: 1. Cardiomegaly with vascular congestion and edema. Pneumonia is not excluded. 2. No large pleural effusion or pneumothorax. Electronically Signed   By: Milas Hock  Radparvar M.D.   On: 05/13/2019 22:44      Transthoracic Echocardiogram  IMPRESSIONS   1. Left ventricular ejection fraction, by visual estimation, is 60 to 65%. The left ventricle has normal function. There is no left ventricular hypertrophy. 2. Definity contrast agent was given IV to delineate the left ventricular endocardial borders. 3. Elevated left ventricular end-diastolic pressure. 4. Left ventricular diastolic parameters are consistent with Grade II diastolic dysfunction (pseudonormalization). 5. Global right ventricle has normal systolic function.The right ventricular size is normal. No increase in right ventricular wall thickness. 6. Left atrial size was normal. 7. Right atrial size was normal. 8. The mitral valve is normal in structure. No evidence of  mitral valve regurgitation. No evidence of mitral stenosis. 9. The tricuspid valve is normal in structure. Tricuspid valve regurgitation is not demonstrated. 10. The aortic valve is normal in structure. Aortic valve regurgitation is not visualized. No evidence of aortic valve sclerosis or stenosis. 11. The pulmonic valve was normal in structure. Pulmonic valve regurgitation is not visualized. 12. The inferior vena cava is dilated in size with >50% respiratory variability, suggesting right atrial pressure of 8 mmHg.  FINDINGS Left Ventricle: Left ventricular ejection fraction, by visual estimation, is 60 to 65%. The left ventricle has normal function. Definity contrast agent was given IV to delineate the left ventricular endocardial borders. There is no left ventricular  hypertrophy. Left ventricular diastolic parameters are consistent with Grade II diastolic dysfunction (pseudonormalization). Elevated left ventricular end-diastolic pressure.  Right Ventricle: The right ventricular size is normal. No increase in right ventricular wall thickness. Global RV systolic function is has normal systolic function.  Left Atrium: Left atrial size was normal in size.  Right Atrium: Right atrial size was normal in size  Pericardium: There is no evidence of pericardial effusion.  Mitral Valve: The mitral valve is normal in structure. No evidence of mitral valve stenosis by observation. No evidence of mitral valve regurgitation.  Tricuspid Valve: The tricuspid valve is normal in structure. Tricuspid valve regurgitation is not demonstrated.  Aortic Valve: The aortic valve is normal in structure. Aortic valve regurgitation is not visualized. The aortic valve is structurally normal, with no evidence of sclerosis or stenosis.  Pulmonic Valve: The pulmonic valve was normal in structure. Pulmonic valve regurgitation is not visualized.  Aorta: The aortic root, ascending aorta and aortic arch are all  structurally normal, with no evidence of dilitation or obstruction.  Venous: The inferior vena cava is dilated in size with greater than 50% respiratory variability, suggesting right atrial pressure of 8 mmHg.  IAS/Shunts: No atrial level shunt detected by color flow Doppler. No ventricular septal defect is seen or detected. There is no evidence of an atrial septal defect.   Subjective: Felt anxious while on CPAP.   Discharge Exam: Vitals:   05/30/19 2215 05/31/19 0835  BP: (!) 127/94 (!) 145/58  Pulse: 64 61  Resp: 20   Temp: 97.6 F (36.4 C) 98.2 F (36.8 C)  SpO2: 96% 99%   Vitals:   05/30/19 1729 05/30/19 2215 05/31/19 0300 05/31/19 0835  BP: (!) 157/51 (!) 127/94  (!) 145/58  Pulse: 60 64  61  Resp: 16 20    Temp: (!) 97.4 F (36.3 C) 97.6 F (36.4 C)  98.2 F (36.8 C)  TempSrc: Oral Axillary  Oral  SpO2: 99% 96%  99%  Weight:   (!) 141.5 kg   Height:        General: Pt is alert, awake, not in  acute distress Cardiovascular: RRR, S1/S2 +, no rubs, no gallops Respiratory: CTA bilaterally, no wheezing, no rhonchi Abdominal: Soft, NT, ND, bowel sounds + Extremities: Lymphedema, no cyanosis    The results of significant diagnostics from this hospitalization (including imaging, microbiology, ancillary and laboratory) are listed below for reference.     Microbiology: No results found for this or any previous visit (from the past 240 hour(s)).   Labs: BNP (last 3 results) Recent Labs    05/16/19 0226 05/18/19 0338 05/23/19 0356  BNP 1,581.0* 204.0* [id number]*   Basic Metabolic Panel: Recent Labs  Lab 05/25/19 0448 05/26/19 0435 05/27/19 0406 05/28/19 0350  NA 147* 145 144 144  K 4.0 3.9 3.9 3.8  CL 105 101 100 98  CO2 29 32 34* 34*  GLUCOSE 143* 132* 146* 130*  BUN 109* 97* 88* 80*  CREATININE 4.25* 4.06* 3.73* 3.57*  CALCIUM 9.2 9.1 9.2 9.0  PHOS 4.1 4.1  --   --    Liver Function Tests: Recent Labs  Lab 05/25/19 0448 05/26/19 0435  05/27/19 0406 05/28/19 1903  AST  --   --  12* 14*  ALT  --   --  15 13  ALKPHOS  --   --  85 68  BILITOT  --   --  0.7 0.8  PROT  --   --  6.8 6.4*  ALBUMIN 2.7* 2.7* 2.6* 2.7*   No results for input(s): LIPASE, AMYLASE in the last 168 hours. No results for input(s): AMMONIA in the last 168 hours. CBC: Recent Labs  Lab 05/25/19 0448 05/26/19 0435  WBC 9.4 9.9  HGB 8.1* 8.2*  HCT 29.3* 30.1*  MCV 99.0 99.3  PLT 309 301   CBG: Recent Labs  Lab 05/30/19 1943 05/30/19 2345 05/31/19 0348 05/31/19 0833 05/31/19 1159  GLUCAP 110* 106* 118* 128* 115*   Urinalysis    Component Value Date/Time   COLORURINE AMBER (A) 05/17/2019 0758   APPEARANCEUR CLOUDY (A) 05/17/2019 0758   LABSPEC 1.017 05/17/2019 0758   PHURINE 5.0 05/17/2019 0758   GLUCOSEU NEGATIVE 05/17/2019 0758   HGBUR SMALL (A) 05/17/2019 0758   BILIRUBINUR NEGATIVE 05/17/2019 [place] 05/17/2019 0758   PROTEINUR 100 (A) 05/17/2019 0758   UROBILINOGEN 0.2 09/29/2014 1224   NITRITE NEGATIVE 05/17/2019 0758   LEUKOCYTESUR LARGE (A) 05/17/2019 0758    Time coordinating discharge: 35 minutes  SIGNED:   Cordelia Poche, MD [facility] 05/31/2019, 3:01 PM

## 2019-05-31 NOTE — Progress Notes (Signed)
[facility] has been called and scheduled for 4:30pm. Patient's family will come pick up her DME. EMS cannot transport the patient along with DME. RN made aware.   CSW is signing off at this time per the patient is discharging home. If any additional needs arise, please re-consult CSW.   Domenic Schwab, MSW, [place] Worker [facility]  [phone]

## 2019-05-31 NOTE — TOC Transition Note (Addendum)
Transition of Care [facility]) - CM/SW Discharge Note   Patient Details  Name: Jodi Underwood MRN: [id number] Date of Birth: 1946/01/09  [facility] of [facility]) CM/SW Contact:  Carles Collet, RN Phone Number: 05/31/2019, 2:47 PM   Clinical Narrative:    Choice offered by CSW, chose [facility]. Referral made to [facility] who can accept. 3/1 ordered, will be delivered to room by [employer], likely around 4:30. Family to provide transport home. No other CMneeds idetified at this time.   Addendum- patient will transport va [facility], will be set up CSW. Notified [place] w [employer] to deliver 3/1 to the house, and to change it to a bariatric one.     Final next level of care: [place] Barriers to Discharge: No Barriers Identified   Patient Goals and CMS Choice Patient states their goals for this hospitalization and ongoing recovery are:: To eventually return home w/ [facility] CMS Medicare.gov Compare Post Acute Care list provided to:: Patient Represenative (must comment)(Stephanie Lins) Choice offered to / list presented to : Adult Children  Discharge Placement                       Discharge Plan and Services     Post Acute Care Choice: Home Health          DME Arranged: 3-N-1 DME Agency: [employer] Date DME Agency Contacted: 05/31/19 Time DME Agency Contacted: [phone] Representative spoke with at DME Agency: [place]: RN, PT, OT, Nurse's Aide [place] Agency: [employer] [place] Date [place]: 05/31/19 Time [place]: 1447 Representative spoke with at [place]: [place] ([place]) Interventions     Readmission Risk Interventions No flowsheet data found.

## 2019-05-31 NOTE — Discharge Instructions (Signed)
Jodi Underwood,  You were in the hospital with respiratory failure in addition to kidney failure. You have improved enough to be discharged from the hospital. While you were here, there was concern for your hearing. Please follow-up with the ENT doctor as recommended.Your insulin regimen has been changed. This may need to be increased especially if you do not adhere to a diabetes friendly diet.
